# Patient Record
Sex: Female | Born: 1975 | Race: White | Hispanic: No | Marital: Married | Smoking: Former smoker
Health system: Southern US, Community
[De-identification: ages and names within clinical notes are randomized; demographics above are authoritative.]

## PROBLEM LIST (undated history)

## (undated) DIAGNOSIS — U099 Post covid-19 condition, unspecified: Secondary | ICD-10-CM

## (undated) DIAGNOSIS — R569 Unspecified convulsions: Secondary | ICD-10-CM

## (undated) DIAGNOSIS — R55 Syncope and collapse: Secondary | ICD-10-CM

## (undated) DIAGNOSIS — I499 Cardiac arrhythmia, unspecified: Secondary | ICD-10-CM

## (undated) DIAGNOSIS — F419 Anxiety disorder, unspecified: Secondary | ICD-10-CM

## (undated) DIAGNOSIS — Z8679 Personal history of other diseases of the circulatory system: Secondary | ICD-10-CM

## (undated) DIAGNOSIS — G43909 Migraine, unspecified, not intractable, without status migrainosus: Secondary | ICD-10-CM

## (undated) DIAGNOSIS — K219 Gastro-esophageal reflux disease without esophagitis: Secondary | ICD-10-CM

## (undated) DIAGNOSIS — I509 Heart failure, unspecified: Secondary | ICD-10-CM

## (undated) DIAGNOSIS — J45909 Unspecified asthma, uncomplicated: Secondary | ICD-10-CM

## (undated) DIAGNOSIS — G4730 Sleep apnea, unspecified: Secondary | ICD-10-CM

## (undated) DIAGNOSIS — M1990 Unspecified osteoarthritis, unspecified site: Secondary | ICD-10-CM

## (undated) DIAGNOSIS — F329 Major depressive disorder, single episode, unspecified: Secondary | ICD-10-CM

## (undated) DIAGNOSIS — F32A Depression, unspecified: Secondary | ICD-10-CM

## (undated) HISTORY — DX: Migraine, unspecified, not intractable, without status migrainosus: G43.909

## (undated) HISTORY — DX: Unspecified osteoarthritis, unspecified site: M19.90

## (undated) HISTORY — DX: Gastro-esophageal reflux disease without esophagitis: K21.9

## (undated) HISTORY — DX: Personal history of other diseases of the circulatory system: Z86.79

## (undated) HISTORY — DX: Post covid-19 condition, unspecified: U09.9

## (undated) HISTORY — DX: Heart failure, unspecified: I50.9

## (undated) HISTORY — DX: Sleep apnea, unspecified: G47.30

## (undated) HISTORY — DX: Unspecified asthma, uncomplicated: J45.909

## (undated) HISTORY — PX: OTHER SURGICAL HISTORY: SHX169

## (undated) HISTORY — DX: Unspecified convulsions: R56.9

## (undated) HISTORY — DX: Major depressive disorder, single episode, unspecified: F32.9

## (undated) HISTORY — DX: Depression, unspecified: F32.A

## (undated) HISTORY — DX: Syncope and collapse: R55

## (undated) HISTORY — DX: Anxiety disorder, unspecified: F41.9

---

## 2002-09-25 HISTORY — PX: TUBAL LIGATION: SHX77

## 2006-01-04 ENCOUNTER — Ambulatory Visit: Payer: Self-pay | Admitting: *Deleted

## 2007-04-23 ENCOUNTER — Ambulatory Visit: Payer: Self-pay | Admitting: Internal Medicine

## 2007-05-06 ENCOUNTER — Emergency Department: Payer: Self-pay

## 2007-05-06 ENCOUNTER — Other Ambulatory Visit: Payer: Self-pay

## 2007-05-10 ENCOUNTER — Ambulatory Visit: Payer: Self-pay | Admitting: Internal Medicine

## 2008-03-23 ENCOUNTER — Ambulatory Visit: Payer: Self-pay | Admitting: Internal Medicine

## 2008-03-26 ENCOUNTER — Ambulatory Visit: Payer: Self-pay | Admitting: Internal Medicine

## 2008-06-01 ENCOUNTER — Emergency Department: Payer: Self-pay | Admitting: Internal Medicine

## 2008-06-01 ENCOUNTER — Other Ambulatory Visit: Payer: Self-pay

## 2008-06-05 ENCOUNTER — Ambulatory Visit: Payer: Self-pay | Admitting: Otolaryngology

## 2008-10-20 ENCOUNTER — Ambulatory Visit: Payer: Self-pay | Admitting: Gastroenterology

## 2009-04-18 ENCOUNTER — Emergency Department: Payer: Self-pay | Admitting: Emergency Medicine

## 2009-04-26 ENCOUNTER — Encounter: Payer: Self-pay | Admitting: Family Medicine

## 2011-12-21 LAB — HM PAP SMEAR: HM Pap smear: NEGATIVE

## 2012-02-03 ENCOUNTER — Emergency Department: Payer: Self-pay | Admitting: Internal Medicine

## 2012-02-03 LAB — URINALYSIS, COMPLETE
Bilirubin,UR: NEGATIVE
Glucose,UR: NEGATIVE mg/dL (ref 0–75)
Ketone: NEGATIVE
Ph: 6 (ref 4.5–8.0)
RBC,UR: 53 /HPF (ref 0–5)
Specific Gravity: 1.005 (ref 1.003–1.030)
Squamous Epithelial: 2
Transitional Epi: <1

## 2012-02-05 LAB — URINE CULTURE

## 2012-03-18 LAB — HM COLONOSCOPY

## 2012-04-11 ENCOUNTER — Emergency Department: Payer: Self-pay | Admitting: Unknown Physician Specialty

## 2012-07-28 ENCOUNTER — Emergency Department: Payer: Self-pay | Admitting: Emergency Medicine

## 2013-01-01 DIAGNOSIS — N971 Female infertility of tubal origin: Secondary | ICD-10-CM | POA: Insufficient documentation

## 2013-03-18 ENCOUNTER — Ambulatory Visit: Payer: Self-pay | Admitting: Internal Medicine

## 2013-04-23 ENCOUNTER — Emergency Department: Payer: Self-pay | Admitting: Emergency Medicine

## 2013-04-23 LAB — URINALYSIS, COMPLETE
Bilirubin,UR: NEGATIVE
Ketone: NEGATIVE
Nitrite: NEGATIVE
Protein: NEGATIVE
Specific Gravity: 1.002 (ref 1.003–1.030)
Squamous Epithelial: 1
WBC UR: NONE SEEN /HPF (ref 0–5)

## 2013-04-23 LAB — CBC
HGB: 14.3 g/dL (ref 12.0–16.0)
MCH: 28.9 pg (ref 26.0–34.0)
MCHC: 34.3 g/dL (ref 32.0–36.0)
Platelet: 221 10*3/uL (ref 150–440)
RBC: 4.93 10*6/uL (ref 3.80–5.20)
RDW: 13.7 % (ref 11.5–14.5)

## 2013-04-23 LAB — COMPREHENSIVE METABOLIC PANEL
Albumin: 3.8 g/dL (ref 3.4–5.0)
Anion Gap: 6 — ABNORMAL LOW (ref 7–16)
BUN: 5 mg/dL — ABNORMAL LOW (ref 7–18)
Bilirubin,Total: 0.4 mg/dL (ref 0.2–1.0)
Calcium, Total: 8.5 mg/dL (ref 8.5–10.1)
EGFR (African American): >60
EGFR (Non-African Amer.): >60
Osmolality: 276 (ref 275–301)
Potassium: 3.5 mmol/L (ref 3.5–5.1)
SGPT (ALT): 17 U/L (ref 12–78)
Total Protein: 7 g/dL (ref 6.4–8.2)

## 2013-04-23 LAB — HCG, QUANTITATIVE, PREGNANCY: Beta Hcg, Quant.: 74 m[IU]/mL — ABNORMAL HIGH

## 2013-04-25 ENCOUNTER — Other Ambulatory Visit: Payer: Self-pay | Admitting: Emergency Medicine

## 2013-04-25 LAB — HCG, QUANTITATIVE, PREGNANCY: Beta Hcg, Quant.: 49 m[IU]/mL — ABNORMAL HIGH

## 2013-05-15 ENCOUNTER — Ambulatory Visit: Payer: Self-pay | Admitting: Internal Medicine

## 2013-07-16 ENCOUNTER — Encounter: Payer: Self-pay | Admitting: Internal Medicine

## 2013-07-16 ENCOUNTER — Ambulatory Visit (INDEPENDENT_AMBULATORY_CARE_PROVIDER_SITE_OTHER): Payer: 59 | Admitting: Internal Medicine

## 2013-07-16 VITALS — BP 120/80 | HR 102 | Temp 98.4°F | Ht 66.0 in | Wt 159.0 lb

## 2013-07-16 DIAGNOSIS — J45909 Unspecified asthma, uncomplicated: Secondary | ICD-10-CM

## 2013-07-16 DIAGNOSIS — Z733 Stress, not elsewhere classified: Secondary | ICD-10-CM

## 2013-07-16 DIAGNOSIS — G43909 Migraine, unspecified, not intractable, without status migrainosus: Secondary | ICD-10-CM

## 2013-07-16 DIAGNOSIS — G40909 Epilepsy, unspecified, not intractable, without status epilepticus: Secondary | ICD-10-CM

## 2013-07-16 DIAGNOSIS — F439 Reaction to severe stress, unspecified: Secondary | ICD-10-CM

## 2013-07-16 DIAGNOSIS — Z1239 Encounter for other screening for malignant neoplasm of breast: Secondary | ICD-10-CM

## 2013-07-16 DIAGNOSIS — K5900 Constipation, unspecified: Secondary | ICD-10-CM

## 2013-07-16 MED ORDER — FLUTICASONE PROPIONATE HFA 110 MCG/ACT IN AERO: 2.0000 | INHALATION_SPRAY | Freq: Two times a day (BID) | RESPIRATORY_TRACT | Status: DC

## 2013-07-16 MED ORDER — ALBUTEROL SULFATE HFA 108 (90 BASE) MCG/ACT IN AERS: 2.0000 | INHALATION_SPRAY | Freq: Four times a day (QID) | RESPIRATORY_TRACT | Status: DC | PRN

## 2013-07-20 ENCOUNTER — Encounter: Payer: Self-pay | Admitting: Internal Medicine

## 2013-07-20 DIAGNOSIS — G40909 Epilepsy, unspecified, not intractable, without status epilepticus: Secondary | ICD-10-CM | POA: Insufficient documentation

## 2013-07-20 DIAGNOSIS — R198 Other specified symptoms and signs involving the digestive system and abdomen: Secondary | ICD-10-CM | POA: Insufficient documentation

## 2013-07-20 DIAGNOSIS — F439 Reaction to severe stress, unspecified: Secondary | ICD-10-CM | POA: Insufficient documentation

## 2013-07-20 DIAGNOSIS — G43909 Migraine, unspecified, not intractable, without status migrainosus: Secondary | ICD-10-CM | POA: Insufficient documentation

## 2013-07-20 DIAGNOSIS — J45909 Unspecified asthma, uncomplicated: Secondary | ICD-10-CM | POA: Insufficient documentation

## 2013-07-20 NOTE — Progress Notes (Signed)
Subjective:    Patient ID: Heather [name], female    DOB: February 28, 1976, 37 y.o.   MRN: [id number]  HPI 37 year old female with past history of migraine headaches, seizure disorder, depression and asthma who comes in today to follow up on these issues as well as to transfer her care here to [facility].  She states that she is still having issues with migraine headaches.  Takes excedrin migraine and gets in a dark room.  Has seen neurology previously for her seizures.  The seizures have stopped.  On no medication now.  Headaches are persistent.  Still sees Dr Evelene Croon.  On valium now prn.  Does not take this daily.  On no other medication.  States she is doing better now that she is separated.  Headaches occur either pre or post her menstrual cycle.  She also states she is using a breathing treatment daily.  Not using her flovent regularly.  She is out of the medication.  No nausea or vomiting.  Does report constipation.  Takes fiber daily.  Has dx of IBS.  Last bowel movement two days ago.     Past Medical History  Diagnosis Date  . Depression     bipolar  . Migraines   . Asthma   . Seizures   . H/O cardiac arrhythmia   . Fainting spell     H/O    Outpatient Encounter Prescriptions as of 07/16/2013  Medication Sig Dispense Refill  . albuterol (PROVENTIL HFA;VENTOLIN HFA) 108 (90 BASE) MCG/ACT inhaler Inhale 2 puffs into the lungs every 6 (six) hours as needed for wheezing.  1 Inhaler  3  . diazepam (VALIUM) 2 MG tablet 1-2 tablets daily as needed      . fluticasone (FLOVENT HFA) 110 MCG/ACT inhaler Inhale 2 puffs into the lungs 2 (two) times daily.  1 Inhaler  3  . [DISCONTINUED] albuterol (PROVENTIL HFA;VENTOLIN HFA) 108 (90 BASE) MCG/ACT inhaler Inhale 2 puffs into the lungs every 6 (six) hours as needed for wheezing.      . [DISCONTINUED] fluticasone (FLOVENT HFA) 110 MCG/ACT inhaler Inhale 2 puffs into the lungs 2 (two) times daily.      . [DISCONTINUED] levalbuterol (XOPENEX HFA) 45 MCG/ACT  inhaler Inhale 2 puffs into the lungs every 6 (six) hours as needed for wheezing.      . [DISCONTINUED] omeprazole (PRILOSEC) 20 MG capsule Take 20 mg by mouth daily.      . [DISCONTINUED] Belladonna Alk-Phenobarbital (DONNATAL PO) 1 tsp as needed for severe pain      . [DISCONTINUED] levETIRAcetam (KEPPRA) 250 MG tablet Take 250 mg by mouth 2 (two) times daily.       No facility-administered encounter medications on file as of 07/16/2013.    Review of Systems Patient denies any lightheadedness or dizziness.  Persistent intermittent headaches as outlined.   No chest pain, tightness or palpitations.  Breathing as outlined.   No nausea or vomiting.  No acid reflux.  No abdominal pain or cramping.  No bowel change, such as diarrhea, BRBPR or melana.  Does report constipation as outlined.  Stress better since her separation.   No urine change.        Objective:   Physical Exam Filed Vitals:   07/16/13 0916  BP: 120/80  Pulse: 102  Temp: 98.4 F (56.48 C)   37 year old female in no acute distress.   HEENT:  Nares- clear.  Oropharynx - without lesions. NECK:  Supple.  Nontender.  No  audible bruit.  HEART:  Appears to be regular. LUNGS:  No crackles or wheezing audible.  Respirations even and unlabored.  RADIAL PULSE:  Equal bilaterally.     ABDOMEN:  Soft, nontender.  Bowel sounds present and normal.  No audible abdominal bruit.    EXTREMITIES:  No increased edema present.  DP pulses palpable and equal bilaterally.          Assessment & Plan:  HEALTH MAINTENANCE.  Schedule her for a physical when due.  Obtain records to review.  Has never had a mammogram.  Schedule.    I spent 30 minutes with the patient and more than 50% of the time was spent in consultation regarding the above.

## 2013-07-20 NOTE — Assessment & Plan Note (Signed)
Persistent intermittent problem for her.  States she is afraid of some of the medications for headaches.  After discussion, she preferred to be referred to neurology for evaluation and further treatment.

## 2013-07-20 NOTE — Assessment & Plan Note (Signed)
States is better since her and her husband separated.  Still seeing Dr Evelene Croon.  Has valium.

## 2013-07-20 NOTE — Assessment & Plan Note (Signed)
Last bowel movement two days ago.  We discussed using and enema or suppository.  Start miralax daily after next bowel movement.  Follow. Stay hydrated.  Fiber.

## 2013-07-20 NOTE — Assessment & Plan Note (Signed)
No seizures now.  On no medication.  Follow. Refer to neurology as outlined.

## 2013-07-20 NOTE — Assessment & Plan Note (Signed)
Breathing as outlined.  Needs to restart flovent.  Use albuterol as needed.  Follow.

## 2013-07-25 ENCOUNTER — Telehealth: Payer: Self-pay | Admitting: Internal Medicine

## 2013-07-25 ENCOUNTER — Encounter: Payer: Self-pay | Admitting: Internal Medicine

## 2013-07-25 ENCOUNTER — Ambulatory Visit (INDEPENDENT_AMBULATORY_CARE_PROVIDER_SITE_OTHER): Payer: 59 | Admitting: Internal Medicine

## 2013-07-25 VITALS — BP 128/82 | HR 89 | Temp 98.7°F | Resp 12 | Wt 160.8 lb

## 2013-07-25 DIAGNOSIS — G43909 Migraine, unspecified, not intractable, without status migrainosus: Secondary | ICD-10-CM

## 2013-07-25 MED ORDER — HYDROCODONE-ACETAMINOPHEN 5-325 MG PO TABS: 1.0000 | ORAL_TABLET | Freq: Four times a day (QID) | ORAL | Status: DC | PRN

## 2013-07-25 MED ORDER — AMITRIPTYLINE HCL 25 MG PO TABS: 25.0000 mg | ORAL_TABLET | Freq: Every day | ORAL | Status: DC

## 2013-07-25 MED ORDER — PROMETHAZINE HCL 12.5 MG PO TABS: 12.5000 mg | ORAL_TABLET | Freq: Three times a day (TID) | ORAL | Status: DC | PRN

## 2013-07-25 NOTE — Patient Instructions (Addendum)
I am starting you on amitriptyline at bedtime for migraine prevention .,  Start with 25 mg ,  You can increase to 50 mg after 5 days.  You can use the vicodin and phenergan for your current headache relief.   Please follow up with Dr Lorin Picket in a few weeks

## 2013-07-25 NOTE — Telephone Encounter (Signed)
Patient Information:  Caller Name: Asher Muir  Phone: [phone]  Patient: Heather [name], Heather [name]  Gender: Female  DOB: 1976/04/17  Age: 37 Years  PCP: Dale McIntosh  Pregnant: No  Office Follow Up:  Does the office need to follow up with this patient?: No  Instructions For The Office: N/A  RN Note:  Appt scheduled today at 2PM with Duncan Dull  Symptoms  Reason For Call & Symptoms: Today, 07/25/2013, pt calling with c/o of headache  since 07/23/2013, and requesting  rx. She has been taking Excedrin Migraine and Aleve. She had some relief on 07/24/2013, but OTC not helping now.  Rating pain  6/10.  Reviewed Health History In EMR: Yes  Reviewed Medications In EMR: Yes  Reviewed Allergies In EMR: Yes  Reviewed Surgeries / Procedures: Yes  Date of Onset of Symptoms: 07/23/2013  Treatments Tried: Aleve, Excedrin  Treatments Tried Worked: No OB / GYN:  LMP: 07/21/2013  Guideline(s) Used:  Headache  Disposition Per Guideline:   See Today or Tomorrow in Office  Reason For Disposition Reached:   Unexplained headache that is present > 24 hours  Advice Given:  Call Back If:  You become worse.  Patient Will Follow Care Advice:  YES  Appointment Scheduled:  07/25/2013 14:00:00 Appointment Scheduled Provider:  N/A

## 2013-07-25 NOTE — Progress Notes (Signed)
Patient ID: Heather [name], female   DOB: [birth year], 37 y.o.   MRN: [id number] Patient Active Problem List   Diagnosis Date Noted  . Migraine 07/20/2013  . Seizure disorder 07/20/2013  . Stress 07/20/2013  . Asthma 07/20/2013  . Constipation 07/20/2013    Subjective:  CC:   Chief Complaint  Patient presents with  . Headache    X 3 days    HPI:   Heather [name] a 37 y.o. female who presents  Past Medical History  Diagnosis Date  . Depression     bipolar  . Migraines   . Asthma   . Seizures   . H/O cardiac arrhythmia   . Fainting spell     H/O    Past Surgical History  Procedure Laterality Date  . Tubal ligation Bilateral 09/2002       The following portions of the patient's history were reviewed and updated as appropriate: Allergies, current medications, and problem list.    Review of Systems:   Patient denies, fevers,  unintentional weight loss, skin rash, eye pain, sinus congestion and sinus pain, sore throat, dysphagia,  hemoptysis , cough, dyspnea, wheezing, chest pain, palpitations, orthopnea, edema, abdominal pain, nausea, melena, diarrhea, constipation, flank pain, dysuria, hematuria, urinary  Frequency, nocturia, numbness, tingling, seizures,  Focal weakness, Loss of consciousness,  Tremor, insomnia, depression, anxiety, and suicidal ideation.     History   Social History  . Marital Status: Single    Spouse Name: N/A    Number of Children: N/A  . Years of Education: N/A   Occupational History  . Not on file.   Social History Main Topics  . Smoking status: Former Smoker -- 0.50 packs/day    Types: Cigarettes  . Smokeless tobacco: Never Used     Comment: Quit mid October 2014  . Alcohol Use: No  . Drug Use: No  . Sexual Activity: Not on file   Other Topics Concern  . Not on file   Social History Narrative  . No narrative on file    Objective:  Filed Vitals:   07/25/13 1411  BP: 128/82  Pulse: 89  Temp: 98.7 F (37.1 C)   Resp: 12     General appearance: alert, cooperative and appears stated age. Looks to be in pain  Ears: normal TM's and external ear canals both ears Neck: no adenopathy, no carotid bruit, supple, symmetrical, trachea midline and thyroid not enlarged Lungs: clear to auscultation bilaterally Heart: regular rate and rhythm, S1, S2 normal, no murmur, click, rub or gallop Abdomen: soft, non-tender; bowel sounds normal; no masses,  no organomegaly Pulses: 2+ and symmetric Neuro: CNs 2-12 intact. DTRs 2+/4 in biceps, brachioradialis, patellars and achilles. Muscle strength 5/5 in upper and lower exremities. Fine resting tremor bilaterally both hands cerebellar function normal. Romberg negative.  No pronator drift.   Gait normal.   Assessment and Plan:  Migraine Current and recurrent . Prescribed  amitryptiline in a titrating dose  for prevention,  And vicodin and promethazine for acute symptoms     Updated Medication List Outpatient Encounter Prescriptions as of 07/25/2013  Medication Sig  . albuterol (PROVENTIL HFA;VENTOLIN HFA) 108 (90 BASE) MCG/ACT inhaler Inhale 2 puffs into the lungs every 6 (six) hours as needed for wheezing.  Marland Kitchen amitriptyline (ELAVIL) 25 MG tablet Take 1 tablet (25 mg total) by mouth at bedtime. Increase to 2 tablets after 5 days if needed  . diazepam (VALIUM) 2 MG tablet 1-2 tablets daily as needed  .  fluticasone (FLOVENT HFA) 110 MCG/ACT inhaler Inhale 2 puffs into the lungs 2 (two) times daily.  Marland Kitchen HYDROcodone-acetaminophen (NORCO/VICODIN) 5-325 MG per tablet Take 1 tablet by mouth every 6 (six) hours as needed for pain.  . promethazine (PHENERGAN) 12.5 MG tablet Take 1 tablet (12.5 mg total) by mouth every 8 (eight) hours as needed for nausea.     No orders of the defined types were placed in this encounter.    No Follow-up on file.

## 2013-07-25 NOTE — Telephone Encounter (Signed)
FYI-seeing Dr. Darrick Huntsman at 2:00

## 2013-07-25 NOTE — Assessment & Plan Note (Addendum)
Current and recurrent . Prescribed  amitryptiline in a titrating dose  for prevention,  And vicodin and promethazine for acute symptoms

## 2013-08-01 ENCOUNTER — Encounter: Payer: Self-pay | Admitting: Emergency Medicine

## 2013-08-20 ENCOUNTER — Ambulatory Visit: Payer: 59 | Admitting: Adult Health

## 2013-08-20 ENCOUNTER — Ambulatory Visit (INDEPENDENT_AMBULATORY_CARE_PROVIDER_SITE_OTHER): Payer: 59 | Admitting: Adult Health

## 2013-08-20 VITALS — BP 126/82 | HR 102 | Temp 98.2°F | Resp 14 | Wt 162.0 lb

## 2013-08-20 DIAGNOSIS — M542 Cervicalgia: Secondary | ICD-10-CM

## 2013-08-20 MED ORDER — PREDNISONE 10 MG PO TABS: ORAL_TABLET | ORAL | Status: DC

## 2013-08-20 MED ORDER — CYCLOBENZAPRINE HCL 5 MG PO TABS: 5.0000 mg | ORAL_TABLET | Freq: Three times a day (TID) | ORAL | Status: DC | PRN
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
## 2013-08-20 NOTE — Assessment & Plan Note (Signed)
Muscle strain - significant. Ongoing > 1.5 weeks. Prednisone taper, flexeril. If no improvement in 1-2 weeks we'll send for films and physical therapy as needed.

## 2013-08-20 NOTE — Progress Notes (Signed)
Pre visit review using our clinic review tool, if applicable. No additional management support is needed unless otherwise documented below in the visit note. 

## 2013-08-20 NOTE — Patient Instructions (Signed)
  Begin a prednisone taper as follows:   Day one-6 tablets  Day 2 - 5  tablets  Day 3 - 4 tablets  Day 4 - 3 tablets  Day 5 - 2 tablets  Day 6 - 1 tablets   While taking the prednisone, do not take ibuprofen, aleve, motrin. You may take tylenol or your hydrocodone as needed. Once you finish the prednisone taper then you can resume the ibuprofen or aleve.  Take flexeril 3 times a day as needed for muscle spasms. This medication may make you sleepy.  Apply ice alternating with heat to the area. Try to rest. Use a good neck support pillow.  If no improvement within 1-2 weeks please call and we will send you for xray of your neck.

## 2013-10-28 ENCOUNTER — Encounter: Payer: 59 | Admitting: Internal Medicine

## 2013-11-27 ENCOUNTER — Encounter: Payer: Self-pay | Admitting: Internal Medicine

## 2014-06-19 ENCOUNTER — Emergency Department: Payer: Self-pay | Admitting: Emergency Medicine

## 2014-06-19 LAB — CBC
HCT: 40.7 % (ref 35.0–47.0)
HGB: 13.2 g/dL (ref 12.0–16.0)
MCH: 28.1 pg (ref 26.0–34.0)
MCHC: 32.5 g/dL (ref 32.0–36.0)
MCV: 87 fL (ref 80–100)
PLATELETS: 185 10*3/uL (ref 150–440)
RBC: 4.7 10*6/uL (ref 3.80–5.20)
RDW: 14.2 % (ref 11.5–14.5)
WBC: 10.6 10*3/uL (ref 3.6–11.0)

## 2014-06-19 LAB — COMPREHENSIVE METABOLIC PANEL
ALBUMIN: 3.5 g/dL (ref 3.4–5.0)
ALK PHOS: 169 U/L — AB
ANION GAP: 6 — AB (ref 7–16)
BUN: 5 mg/dL — ABNORMAL LOW (ref 7–18)
Bilirubin,Total: 0.4 mg/dL (ref 0.2–1.0)
CALCIUM: 8.2 mg/dL — AB (ref 8.5–10.1)
CHLORIDE: 107 mmol/L (ref 98–107)
Co2: 25 mmol/L (ref 21–32)
Creatinine: 0.89 mg/dL (ref 0.60–1.30)
EGFR (Non-African Amer.): >60
Glucose: 105 mg/dL — ABNORMAL HIGH (ref 65–99)
OSMOLALITY: 273 (ref 275–301)
Potassium: 3.5 mmol/L (ref 3.5–5.1)
SGOT(AST): 32 U/L (ref 15–37)
SGPT (ALT): 49 U/L
SODIUM: 138 mmol/L (ref 136–145)
Total Protein: 7.1 g/dL (ref 6.4–8.2)

## 2014-06-19 LAB — TROPONIN I: Troponin-I: <0.02 ng/mL

## 2014-06-24 ENCOUNTER — Ambulatory Visit: Payer: 59 | Admitting: Internal Medicine

## 2014-06-24 ENCOUNTER — Telehealth: Payer: Self-pay | Admitting: Internal Medicine

## 2014-06-24 NOTE — Telephone Encounter (Signed)
Pt late for appt. Pt stated that she wrote down 8:45 on calendar. Please advise to cancel [email or web address]

## 2014-06-25 ENCOUNTER — Ambulatory Visit: Payer: 59 | Admitting: Internal Medicine

## 2014-06-25 DIAGNOSIS — Z0289 Encounter for other administrative examinations: Secondary | ICD-10-CM

## 2014-07-27 ENCOUNTER — Emergency Department: Payer: Self-pay | Admitting: Emergency Medicine

## 2014-08-15 ENCOUNTER — Other Ambulatory Visit: Payer: Self-pay | Admitting: Internal Medicine

## 2015-02-02 NOTE — H&P (Signed)
L&D Evaluation:  History Expanded:  HPI 73 yuo [id number] with two SAB no d and c neededwho had her tubes re[facility]astam[facility] on May 9. 2014. she had her next menses on May 25, june 2, july 23 which she did not have an didi a UPT and got light faint pos four days ago. Today she presents to the ER with severe pain in her right LQ and some spotting aout 3-4 days ago. SHe has had pain on the right since her surgery and this is just an exacerbation of that pain.   Gravida 6   Term 4   PreTerm 0   Abortion 2   Living 4   Blood Type (Maternal) pending   Group B Strep Results Maternal (Result >5wks must be treated as unknown) unknown/result > 5 weeks ago   Maternal HIV Unknown   Maternal Syphilis Ab Unknown   Maternal Varicella Unknown   Rubella Results (Maternal) unknown   Maternal T-Dap Unknown   [facility] 21-Feb-2014   Presents with abdominal pain, vaginal bleeding   Patient's Medical History No Chronic Illness   Patient's Surgical History none   Medications Pre Natal Vitamins   Allergies NKDA   Social History none   Family History Non-Contributory   Current Prenatal Course Notable For possible ectopic   ROS:  General normal   HEENT normal   CNS normal   GI normal   GU rlq pain sharp stabbing   Resp normal   CV normal   Renal normal   MS normal   Exam:  Vital Signs stable   Urine Protein not completed   General no apparent distress   Mental Status clear   Chest clear   Heart normal sinus rhythm   Abdomen slight tender to palpation but not rebound   Estimated Fetal Weight Small for gestational age   Back no CVAT   Reflexes 1+   Pelvic no external lesions   Mebranes Intact   FHT too early to see   Other US shows complex mass on right side and fluid in the uterus with no fetal pole beta level is 75   Impression:  Impression ectopic vs easrly iup   Plan:  Plan serial quant beat ion friday am and [place] my office 05/05/13 to see if IUP    Comments call if any worsening ofthe pain or bleeding.   Follow Up Appointment need to schedule   Electronic Signatures: Erik Obey (MD)  (Signed 30-Jul-14 16:27)  Authored: L&D Evaluation   Last Updated: 30-Jul-14 16:27 by Erik Obey (MD)

## 2015-06-25 ENCOUNTER — Encounter: Payer: Self-pay | Admitting: *Deleted

## 2015-06-25 ENCOUNTER — Emergency Department
Admission: EM | Admit: 2015-06-25 | Discharge: 2015-06-26 | Disposition: A | Discharge status: Home / Self Care | Payer: 59 | Attending: Emergency Medicine | Admitting: Emergency Medicine

## 2015-06-25 ENCOUNTER — Emergency Department: Payer: 59

## 2015-06-25 DIAGNOSIS — Z791 Long term (current) use of non-steroidal anti-inflammatories (NSAID): Secondary | ICD-10-CM | POA: Insufficient documentation

## 2015-06-25 DIAGNOSIS — J45909 Unspecified asthma, uncomplicated: Secondary | ICD-10-CM | POA: Diagnosis present

## 2015-06-25 DIAGNOSIS — Z79899 Other long term (current) drug therapy: Secondary | ICD-10-CM | POA: Insufficient documentation

## 2015-06-25 DIAGNOSIS — Z87891 Personal history of nicotine dependence: Secondary | ICD-10-CM | POA: Insufficient documentation

## 2015-06-25 DIAGNOSIS — J45901 Unspecified asthma with (acute) exacerbation: Secondary | ICD-10-CM | POA: Insufficient documentation

## 2015-06-25 DIAGNOSIS — J069 Acute upper respiratory infection, unspecified: Secondary | ICD-10-CM | POA: Insufficient documentation

## 2015-06-25 DIAGNOSIS — Z7951 Long term (current) use of inhaled steroids: Secondary | ICD-10-CM | POA: Insufficient documentation

## 2015-06-25 LAB — BASIC METABOLIC PANEL
ANION GAP: 5 (ref 5–15)
BUN: 11 mg/dL (ref 6–20)
CALCIUM: 9.1 mg/dL (ref 8.9–10.3)
CO2: 23 mmol/L (ref 22–32)
Chloride: 111 mmol/L (ref 101–111)
Creatinine, Ser: 0.76 mg/dL (ref 0.44–1.00)
Glucose, Bld: 112 mg/dL — ABNORMAL HIGH (ref 65–99)
POTASSIUM: 3.5 mmol/L (ref 3.5–5.1)
SODIUM: 139 mmol/L (ref 135–145)

## 2015-06-25 LAB — CBC
HCT: 42.4 % (ref 35.0–47.0)
Hemoglobin: 14.4 g/dL (ref 12.0–16.0)
MCH: 28 pg (ref 26.0–34.0)
MCHC: 34 g/dL (ref 32.0–36.0)
MCV: 82.4 fL (ref 80.0–100.0)
PLATELETS: 277 10*3/uL (ref 150–440)
RBC: 5.14 MIL/uL (ref 3.80–5.20)
RDW: 14.8 % — ABNORMAL HIGH (ref 11.5–14.5)
WBC: 16.8 10*3/uL — AB (ref 3.6–11.0)

## 2015-06-25 MED ORDER — PREDNISONE 20 MG PO TABS: 40.0000 mg | ORAL_TABLET | Freq: Every day | ORAL | Status: DC

## 2015-06-25 MED ORDER — METHYLPREDNISOLONE SODIUM SUCC 125 MG IJ SOLR
125.0000 mg | Freq: Once | INTRAMUSCULAR | Status: AC
  Administered 2015-06-25: 125 mg via INTRAVENOUS
  Filled 2015-06-25: qty 2

## 2015-06-25 MED ORDER — ALBUTEROL SULFATE (2.5 MG/3ML) 0.083% IN NEBU
INHALATION_SOLUTION | RESPIRATORY_TRACT | Status: AC
  Administered 2015-06-25: 5 mg via RESPIRATORY_TRACT
  Filled 2015-06-25: qty 3

## 2015-06-25 MED ORDER — IPRATROPIUM-ALBUTEROL 0.5-2.5 (3) MG/3ML IN SOLN
3.0000 mL | Freq: Once | RESPIRATORY_TRACT | Status: AC
  Administered 2015-06-25: 3 mL via RESPIRATORY_TRACT
  Filled 2015-06-25: qty 3

## 2015-06-25 MED ORDER — ALBUTEROL SULFATE (2.5 MG/3ML) 0.083% IN NEBU
5.0000 mg | INHALATION_SOLUTION | Freq: Once | RESPIRATORY_TRACT | Status: AC
  Administered 2015-06-25: 5 mg via RESPIRATORY_TRACT
  Filled 2015-06-25: qty 3

## 2015-06-25 MED ORDER — ALBUTEROL SULFATE HFA 108 (90 BASE) MCG/ACT IN AERS: 2.0000 | INHALATION_SPRAY | Freq: Four times a day (QID) | RESPIRATORY_TRACT | Status: DC | PRN

## 2015-06-25 MED ORDER — AZITHROMYCIN 250 MG PO TABS: ORAL_TABLET | ORAL | Status: AC

## 2015-06-25 MED ORDER — ALBUTEROL SULFATE (2.5 MG/3ML) 0.083% IN NEBU: 2.5000 mg | INHALATION_SOLUTION | Freq: Four times a day (QID) | RESPIRATORY_TRACT | Status: DC | PRN

## 2015-06-25 NOTE — ED Notes (Signed)
Pt presents w/ audible wheezing and coarse breath sounds, dyspnea, and labored breathing, unable to complete sentences. Pt has hx of asthma and has been using nebs and MDI @ home w/o relief. Pt has dry, deep cough, occasionally productive w/ tenacious yellow/white mucous.

## 2015-06-25 NOTE — ED Provider Notes (Signed)
[facility] Emergency Department Provider Note  Time seen: 10:51 PM  I have reviewed the triage vital signs and the nursing notes.   HISTORY  Chief Complaint Asthma    HPI Heather [name] is a 39 y.o. female with a past medical history of asthma presents the emergency department with chest tightness, cough and trouble breathing for the past 5 days. According to the patient for the past 5 days she has had a dry cough, trouble breathing and a lot of chest tightness. Patient states increased wheeze at home, no better with nebulizers. Denies fever. Denies chest pain but states chest tightness. Mild to moderate chest tightness, no modifying factors identified.    Past Medical History  Diagnosis Date  . Depression     bipolar  . Migraines   . Asthma   . Seizures   . H/O cardiac arrhythmia   . Fainting spell     H/O    Patient Active Problem List   Diagnosis Date Noted  . Neck pain on left side 08/20/2013  . Migraine 07/20/2013  . Seizure disorder 07/20/2013  . Stress 07/20/2013  . Asthma 07/20/2013  . Constipation 07/20/2013    Past Surgical History  Procedure Laterality Date  . Tubal ligation Bilateral 09/2002    Current Outpatient Rx  Name  Route  Sig  Dispense  Refill  . amitriptyline (ELAVIL) 25 MG tablet   Oral   Take 1 tablet (25 mg total) by mouth at bedtime. Increase to 2 tablets after 5 days if needed   60 tablet   3   . cyclobenzaprine (FLEXERIL) 5 MG tablet   Oral   Take 1 tablet (5 mg total) by mouth 3 (three) times daily as needed for muscle spasms.   45 tablet   1   . diazepam (VALIUM) 2 MG tablet      1-2 tablets daily as needed         . fluticasone (FLOVENT HFA) 110 MCG/ACT inhaler   Inhalation   Inhale 2 puffs into the lungs 2 (two) times daily.   1 Inhaler   3   . HYDROcodone-acetaminophen (NORCO/VICODIN) 5-325 MG per tablet   Oral   Take 1 tablet by mouth every 6 (six) hours as needed for pain.   30  tablet   0   . ibuprofen (ADVIL,MOTRIN) 200 MG tablet   Oral   Take 200 mg by mouth every 6 (six) hours as needed.         . naproxen sodium (ANAPROX) 220 MG tablet   Oral   Take 220 mg by mouth 2 (two) times daily with a meal.         . predniSONE (DELTASONE) 10 MG tablet      Take per instructions.   21 tablet   0   . PROAIR HFA 108 (90 BASE) MCG/ACT inhaler      INHALE 2 PUFFS INTO THE LUNGS EVERY 6 (SIX) HOURS AS NEEDED FOR WHEEZING.   8.5 each   0     PT NEEDS APPOINT FOR FURTHER REFILLS   . promethazine (PHENERGAN) 12.5 MG tablet   Oral   Take 1 tablet (12.5 mg total) by mouth every 8 (eight) hours as needed for nausea.   20 tablet   0     Allergies Augmentin; Erythromycin; Lithium; and Tramadol  Family History  Problem Relation Age of Onset  . Hypertension Mother   . Diabetes Mother   . Arthritis Mother   .  Heart disease Mother     Social History Social History  Substance Use Topics  . Smoking status: Former Smoker -- 0.50 packs/day    Types: Cigarettes  . Smokeless tobacco: Never Used     Comment: Quit mid October 2014  . Alcohol Use: No    Review of Systems Constitutional: Negative for fever. Cardiovascular: Positive for chest tightness Respiratory: Positive for shortness of breath positive for cough Gastrointestinal: Negative for abdominal pain Neurological: Negative for headache 10-point ROS otherwise negative.  ____________________________________________   PHYSICAL EXAM:  VITAL SIGNS: ED Triage Vitals  Enc Vitals Group     BP 06/25/15 1948 152/99 mmHg     Pulse Rate 06/25/15 1948 127     Resp 06/25/15 1948 22     Temp 06/25/15 1948 97.9 F (36.6 C)     Temp Source 06/25/15 1948 Oral     SpO2 06/25/15 1948 97 %     Weight 06/25/15 1948 155 lb (70.308 kg)     Height 06/25/15 1948 5' 5.5" (1.664 m)     Head Cir --      Peak Flow --      Pain Score 06/25/15 2237 0     Pain Loc --      Pain Edu? --      Excl. in [place]? --      Constitutional: Alert and oriented. Audible wheeze. No distress. Eyes: Normal exam ENT   Head: Normocephalic and atraumatic.   Mouth/Throat: Mucous membranes are moist. Cardiovascular: Normal rate, regular rhythm. No murmurs, rubs, or gallops. Respiratory: Normal respiratory effort, mild tachypnea, mild bilateral expiratory wheeze (status post DuoNeb's). Gastrointestinal: Soft and nontender. No distention.   Musculoskeletal: Nontender with normal range of motion in all extremities.  Neurologic:  Normal speech and language. No gross focal neurologic deficits  Skin:  Skin is warm, dry and intact.  Psychiatric: Mood and affect are normal. Speech and behavior are normal.  ____________________________________________   RADIOLOGY  Chest x-ray appears clear on my read.  ____________________________________________   INITIAL IMPRESSION / ASSESSMENT AND PLAN / ED COURSE  Pertinent labs & imaging results that were available during my care of the patient were reviewed by me and considered in my medical decision making (see chart for details).  Patient with shortness of breath, cough, wheeze, history of asthma. Likely URI exacerbating underlying asthma. We'll check basic labs, chest x-ray. Treat with DuoNeb's, Solu-Medrol, and closely monitor in the emergency department.  Chest x-ray appears clear, labs consistent with leukocytosis otherwise within normal limits. Heart rate currently 90-95 bpm. Patient states she is feeling better after breathing treatments. Continues to have a 97-100% room air oxygen saturation. We'll discharge patient home with steroids, Z-Pak, and refills of her home albuterol. Discussed with patient including strict asthma return precautions, patient is agreeable to plan. We'll follow up with a primary care physician.  ____________________________________________   FINAL CLINICAL IMPRESSION(S) / ED DIAGNOSES  Asthma exacerbation Upper respiratory  infection   Harvest Dark, MD 06/25/15 2314

## 2015-06-25 NOTE — ED Notes (Signed)
MD Paduchowski at bedside  

## 2015-06-25 NOTE — ED Notes (Signed)
Pt will be d/c upon completion of breathing treatment. Pt made aware and verbalized understanding at this time.

## 2015-06-25 NOTE — Discharge Instructions (Signed)
Asthma, Acute Bronchospasm °Acute bronchospasm caused by asthma is also referred to as an asthma attack. Bronchospasm means your air passages become narrowed. The narrowing is caused by inflammation and tightening of the muscles in the air tubes (bronchi) in your lungs. This can make it hard to breathe or cause you to wheeze and cough. °CAUSES °Possible triggers are: °· Animal dander from the skin, hair, or feathers of animals. °· Dust mites contained in house dust. °· Cockroaches. °· Pollen from trees or grass. °· Mold. °· Cigarette or tobacco smoke. °· Air pollutants such as dust, household cleaners, hair sprays, aerosol sprays, paint fumes, strong chemicals, or strong odors. °· Cold air or weather changes. Cold air may trigger inflammation. Winds increase molds and pollens in the air. °· Strong emotions such as crying or laughing hard. °· Stress. °· Certain medicines such as aspirin or beta-blockers. °· Sulfites in foods and drinks, such as dried fruits and wine. °· Infections or inflammatory conditions, such as a flu, cold, or inflammation of the nasal membranes (rhinitis). °· Gastroesophageal reflux disease (GERD). GERD is a condition where stomach acid backs up into your esophagus. °· Exercise or strenuous activity. °SIGNS AND SYMPTOMS  °· Wheezing. °· Excessive coughing, particularly at night. °· Chest tightness. °· Shortness of breath. °DIAGNOSIS  °Your health care provider will ask you about your medical history and perform a physical exam. A chest X-ray or blood testing may be performed to look for other causes of your symptoms or other conditions that may have triggered your asthma attack.  °TREATMENT  °Treatment is aimed at reducing inflammation and opening up the airways in your lungs.  Most asthma attacks are treated with inhaled medicines. These include quick relief or rescue medicines (such as bronchodilators) and controller medicines (such as inhaled corticosteroids). These medicines are sometimes  given through an inhaler or a nebulizer. Systemic steroid medicine taken by mouth or given through an IV tube also can be used to reduce the inflammation when an attack is moderate or severe. Antibiotic medicines are only used if a bacterial infection is present.  °HOME CARE INSTRUCTIONS  °· Rest. °· Drink plenty of liquids. This helps the mucus to remain thin and be easily coughed up. Only use caffeine in moderation and do not use alcohol until you have recovered from your illness. °· Do not smoke. Avoid being exposed to secondhand smoke. °· You play a critical role in keeping yourself in good health. Avoid exposure to things that cause you to wheeze or to have breathing problems. °· Keep your medicines up-to-date and available. Carefully follow your health care provider's treatment plan. °· Take your medicine exactly as prescribed. °· When pollen or pollution is bad, keep windows closed and use an air conditioner or go to places with air conditioning. °· Asthma requires careful medical care. See your health care provider for a follow-up as advised. If you are more than [redacted] weeks pregnant and you were prescribed any new medicines, let your obstetrician know about the visit and how you are doing. Follow up with your health care provider as directed. °· After you have recovered from your asthma attack, make an appointment with your outpatient doctor to talk about ways to reduce the likelihood of future attacks. If you do not have a doctor who manages your asthma, make an appointment with a primary care doctor to discuss your asthma. °SEEK IMMEDIATE MEDICAL CARE IF:  °· You are getting worse. °· You have trouble breathing. If severe, call your local   emergency services (911 in the [place]  You develop chest pain or discomfort.  You are vomiting.  You are not able to keep fluids down.  You are coughing up yellow, green, brown, or bloody sputum.  You have a fever and your symptoms suddenly get worse.  You have  trouble swallowing. MAKE SURE YOU:   Understand these instructions.  Will watch your condition.  Will get help right away if you are not doing well or get worse. Document Released: 12/27/2006 Document Revised: 09/16/2013 Document Reviewed: 03/19/2013 [facility] Patient Information 2015 [place], [place]. This information is not intended to replace advice given to you by your health care provider. Make sure you discuss any questions you have with your health care provider.  Upper Respiratory Infection, Adult An upper respiratory infection (URI) is also known as the common cold. It is often caused by a type of germ (virus). Colds are easily spread (contagious). You can pass it to others by kissing, coughing, sneezing, or drinking out of the same glass. Usually, you get better in 1 or 2 weeks.  HOME CARE   Only take medicine as told by your doctor.  Use a warm mist humidifier or breathe in steam from a hot shower.  Drink enough water and fluids to keep your pee (urine) clear or pale yellow.  Get plenty of rest.  Return to work when your temperature is back to normal or as told by your doctor. You may use a face mask and wash your hands to stop your cold from spreading. GET HELP RIGHT AWAY IF:   After the first few days, you feel you are getting worse.  You have questions about your medicine.  You have chills, shortness of breath, or brown or red spit (mucus).  You have yellow or brown snot (nasal discharge) or pain in the face, especially when you bend forward.  You have a fever, puffy (swollen) neck, pain when you swallow, or white spots in the back of your throat.  You have a bad headache, ear pain, sinus pain, or chest pain.  You have a high-pitched whistling sound when you breathe in and out (wheezing).  You have a lasting cough or cough up blood.  You have sore muscles or a stiff neck. MAKE SURE YOU:   Understand these instructions.  Will watch your condition.  Will get  help right away if you are not doing well or get worse. Document Released: 02/28/2008 Document Revised: 12/04/2011 Document Reviewed: 12/17/2013 [facility] Patient Information 2015 [place], [place]. This information is not intended to replace advice given to you by your health care provider. Make sure you discuss any questions you have with your health care provider.

## 2015-07-05 ENCOUNTER — Ambulatory Visit (INDEPENDENT_AMBULATORY_CARE_PROVIDER_SITE_OTHER)
Admission: RE | Admit: 2015-07-05 | Discharge: 2015-07-05 | Disposition: A | Discharge status: Home / Self Care | Payer: 59 | Source: Ambulatory Visit | Attending: Family Medicine | Admitting: Family Medicine

## 2015-07-05 ENCOUNTER — Encounter: Payer: Self-pay | Admitting: Family Medicine

## 2015-07-05 ENCOUNTER — Ambulatory Visit (INDEPENDENT_AMBULATORY_CARE_PROVIDER_SITE_OTHER): Payer: 59 | Admitting: Family Medicine

## 2015-07-05 DIAGNOSIS — R0602 Shortness of breath: Secondary | ICD-10-CM

## 2015-07-05 DIAGNOSIS — J45901 Unspecified asthma with (acute) exacerbation: Secondary | ICD-10-CM | POA: Insufficient documentation

## 2015-07-05 DIAGNOSIS — J4531 Mild persistent asthma with (acute) exacerbation: Secondary | ICD-10-CM | POA: Diagnosis not present

## 2015-07-05 MED ORDER — IPRATROPIUM BROMIDE 0.02 % IN SOLN
0.5000 mg | Freq: Once | RESPIRATORY_TRACT | Status: AC
  Administered 2015-07-05: 0.5 mg via RESPIRATORY_TRACT

## 2015-07-05 MED ORDER — HYDROCODONE-HOMATROPINE 5-1.5 MG/5ML PO SYRP: 5.0000 mL | ORAL_SOLUTION | Freq: Three times a day (TID) | ORAL | Status: DC | PRN

## 2015-07-05 MED ORDER — ALBUTEROL SULFATE (2.5 MG/3ML) 0.083% IN NEBU: 2.5000 mg | INHALATION_SOLUTION | Freq: Four times a day (QID) | RESPIRATORY_TRACT | Status: DC | PRN

## 2015-07-05 MED ORDER — DOXYCYCLINE HYCLATE 100 MG PO TABS: 100.0000 mg | ORAL_TABLET | Freq: Two times a day (BID) | ORAL | Status: DC

## 2015-07-05 MED ORDER — FLUTICASONE PROPIONATE HFA 110 MCG/ACT IN AERO: 2.0000 | INHALATION_SPRAY | Freq: Two times a day (BID) | RESPIRATORY_TRACT | Status: DC

## 2015-07-05 MED ORDER — PREDNISONE 10 MG PO TABS: ORAL_TABLET | ORAL | Status: DC

## 2015-07-05 MED ORDER — ALBUTEROL SULFATE (2.5 MG/3ML) 0.083% IN NEBU
2.5000 mg | INHALATION_SOLUTION | Freq: Once | RESPIRATORY_TRACT | Status: AC
  Administered 2015-07-05: 2.5 mg via RESPIRATORY_TRACT

## 2015-07-05 NOTE — Assessment & Plan Note (Signed)
Patient treated in the clinic with DuoNeb with improvement in breathing and chest tightness/shortness of breath. Chest x-ray ordered and reviewed personally. No infiltrate noted. There may be a potential component of COPD given smoking history. Given severiyy and persistence of symptoms will treat with Prednisone, Doxycycline.  Hycodan for cough.

## 2015-07-05 NOTE — Patient Instructions (Signed)
Take the antibiotic, prednisone, and cough syrup as prescribed.  Be sure to get your xray.  Follow up in 1-2 weeks or sooner if you fail to improve.  Take care  Dr. Lacinda Axon

## 2015-07-05 NOTE — Progress Notes (Signed)
Subjective:  Patient ID: Heather [name], female    DOB: [birth year]  Age: 39 y.o. MRN: [id number]  CC: Cough, congestion, SOB  HPI:  39 year old female with a PMH of asthma presents to clinic today for an acute visit with the above complaints.  Patient reports a two-week history of cough, congestion, and shortness of breath.  Patient went to the ED on 9/30 for evaluation. She was treated for an acute asthma exacerbation and was given prednisone and azithromycin.  Patient reports that she had some mild improvement with treatment but this quickly dissipated.   She presents today with continued symptoms. Cough is persistent and severe. She continues to have shortness of breath and is using albuterol 6-7 times daily with little relief. No known exacerbating factors. No relieving factors. She denies any fever.  Social Hx   Social History   Social History  . Marital Status: Legally Separated    Spouse Name: N/A  . Number of Children: N/A  . Years of Education: N/A   Social History Main Topics  . Smoking status: Former Smoker -- 0.50 packs/day    Types: Cigarettes  . Smokeless tobacco: Never Used     Comment: Quit mid October 2014  . Alcohol Use: No  . Drug Use: No  . Sexual Activity: Not Asked   Other Topics Concern  . None   Social History Narrative   Review of Systems  Constitutional: Negative for fever.  Respiratory: Positive for cough and shortness of breath.    Objective:  BP 130/82 mmHg  Pulse 69  Temp(Src) 98.5 F (36.9 C) (Oral)  Ht 5\' 6"  (1.676 m)  Wt 166 lb 6 oz (75.467 kg)  BMI 26.87 kg/m2  SpO2 96%  LMP 06/11/2015  BP/Weight 07/05/2015 06/25/2015 56/38/7564  Systolic BP 332 951 884  Diastolic BP 82 83 82  Wt. (Lbs) 166.38 155 162  BMI 26.87 25.39 26.16   Physical Exam  Constitutional: She is oriented to person, place, and time.  Labored breathing with audible wheezing.  HENT:  Head: Normocephalic and atraumatic.  Oropharynx clear. Normal TMs  bilaterally.  Cardiovascular: Normal rate and regular rhythm.   No murmur heard. Pulmonary/Chest:  Increased work of breathing. Diffuse wheezing noted.  Neurological: [name] and oriented to person, place, and time.  Vitals reviewed.   Lab Results  Component Value Date   WBC 16.8* 06/25/2015   HGB 14.4 06/25/2015   HCT 42.4 06/25/2015   PLT 277 06/25/2015   GLUCOSE 112* 06/25/2015   ALT 49 06/19/2014   AST 32 06/19/2014   NA 139 06/25/2015   K 3.5 06/25/2015   CL 111 06/25/2015   CREATININE 0.76 06/25/2015   BUN 11 06/25/2015   CO2 23 06/25/2015   Dg Chest 2 View  07/05/2015   CLINICAL DATA:  Cough and shortness of breath for the past 2 weeks, history of asthma, former smoker.  EXAM: CHEST  2 VIEW  COMPARISON:  Chest x-ray of June 25, 2015  FINDINGS: The lungs are mildly hyperinflated and clear. There is no focal infiltrate. The heart and pulmonary vascularity are normal. The mediastinum is normal in width. The bony thorax is unremarkable.  IMPRESSION: Mild hyperinflation consistent with known reactive airway disease. There is no evidence of pneumonia nor other acute cardiopulmonary abnormality.   Electronically Signed   By: David  Martinique M.D.   On: 07/05/2015 12:29   Dg Chest 2 View  06/25/2015   CLINICAL DATA:  Asthma and cough  EXAM: CHEST  2 VIEW  COMPARISON:  07/27/2014  FINDINGS: Suspect central airway thickening with cuffing and tram track appearance perihilar. No collapse or consolidation. No edema, effusion, or air leak. Normal heart size and mediastinal contours.  IMPRESSION: Mild bronchitic change.  No pneumonia or collapse.   Electronically Signed   By: Monte Fantasia M.D.   On: 06/25/2015 23:10    Assessment & Plan:   Problem List Items Addressed This Visit    Asthma with acute exacerbation    Patient treated in the clinic with DuoNeb with improvement in breathing and chest tightness/shortness of breath. Chest x-ray ordered and reviewed personally. No  infiltrate noted. There may be a potential component of COPD given smoking history. Given severiyy and persistence of symptoms will treat with Prednisone, Doxycycline.  Hycodan for cough.        Relevant Medications   albuterol (PROVENTIL) (2.5 MG/3ML) 0.083% nebulizer solution   fluticasone (FLOVENT HFA) 110 MCG/ACT inhaler   predniSONE (DELTASONE) 10 MG tablet   albuterol (PROVENTIL) (2.5 MG/3ML) 0.083% nebulizer solution 2.5 mg (Completed)   ipratropium (ATROVENT) nebulizer solution 0.5 mg (Completed)   Other Relevant Orders   DG Chest 2 View (Completed)      Meds ordered this encounter  Medications  . albuterol (PROVENTIL) (2.5 MG/3ML) 0.083% nebulizer solution    Sig: Take 3 mLs (2.5 mg total) by nebulization every 6 (six) hours as needed for wheezing or shortness of breath.    Dispense:  75 mL    Refill:  2  . fluticasone (FLOVENT HFA) 110 MCG/ACT inhaler    Sig: Inhale 2 puffs into the lungs 2 (two) times daily.    Dispense:  1 Inhaler    Refill:  3  . predniSONE (DELTASONE) 10 MG tablet    Sig: 50 mg (5 tablets) x 3 days, 40 mg (4 tablets) x 3 days, 20 mg (2 tablets) x 3 days, 10 mg (1 tablet) x 3 days then stop.    Dispense:  36 tablet    Refill:  0  . doxycycline (VIBRA-TABS) 100 MG tablet    Sig: Take 1 tablet (100 mg total) by mouth 2 (two) times daily.    Dispense:  20 tablet    Refill:  0  . HYDROcodone-homatropine (HYCODAN) 5-1.5 MG/5ML syrup    Sig: Take 5 mLs by mouth every 8 (eight) hours as needed for cough.    Dispense:  120 mL    Refill:  0  . albuterol (PROVENTIL) (2.5 MG/3ML) 0.083% nebulizer solution 2.5 mg    Sig:   . ipratropium (ATROVENT) nebulizer solution 0.5 mg    Sig:     Follow-up: PRN  Thersa Salt, DO

## 2015-07-05 NOTE — Progress Notes (Signed)
Pre visit review using our clinic review tool, if applicable. No additional management support is needed unless otherwise documented below in the visit note. 

## 2015-07-20 ENCOUNTER — Telehealth: Payer: Self-pay | Admitting: Internal Medicine

## 2015-07-20 ENCOUNTER — Ambulatory Visit (INDEPENDENT_AMBULATORY_CARE_PROVIDER_SITE_OTHER): Payer: 59 | Admitting: Surgical

## 2015-07-20 DIAGNOSIS — Z111 Encounter for screening for respiratory tuberculosis: Secondary | ICD-10-CM | POA: Diagnosis not present

## 2015-07-20 NOTE — Progress Notes (Signed)
Patient came in today for TB skin Test for her job. She works at a daycare. TB test given in right forearm. Patient tolerated well

## 2015-07-20 NOTE — Addendum Note (Signed)
Addended by: Durwin Glaze on: 07/20/2015 03:07 PM   Modules accepted: Level of Service

## 2015-07-22 ENCOUNTER — Ambulatory Visit: Payer: 59

## 2015-07-23 ENCOUNTER — Telehealth: Payer: Self-pay | Admitting: Internal Medicine

## 2015-07-23 ENCOUNTER — Ambulatory Visit (INDEPENDENT_AMBULATORY_CARE_PROVIDER_SITE_OTHER): Payer: 59 | Admitting: Surgical

## 2015-07-23 DIAGNOSIS — Z111 Encounter for screening for respiratory tuberculosis: Secondary | ICD-10-CM | POA: Diagnosis not present

## 2015-07-23 LAB — READ PPD: TB Skin Test: NEGATIVE

## 2015-07-23 NOTE — Telephone Encounter (Signed)
Scheduled on nurse schedule

## 2015-07-23 NOTE — Telephone Encounter (Signed)
Ok. Linna Caprice I see it. Thanks.

## 2015-07-23 NOTE — Addendum Note (Signed)
Addended by: Durwin Glaze on: 07/23/2015 05:18 PM   Modules accepted: Level of Service

## 2015-07-23 NOTE — Telephone Encounter (Signed)
Pt states she can come in at 4:pm to get her TB test read. Thank You!

## 2015-08-18 ENCOUNTER — Encounter: Payer: Self-pay | Admitting: Family Medicine

## 2015-08-18 ENCOUNTER — Ambulatory Visit (INDEPENDENT_AMBULATORY_CARE_PROVIDER_SITE_OTHER): Payer: 59 | Admitting: Family Medicine

## 2015-08-18 VITALS — BP 132/84 | HR 92 | Temp 98.7°F | Ht 66.0 in | Wt 167.2 lb

## 2015-08-18 DIAGNOSIS — M25512 Pain in left shoulder: Secondary | ICD-10-CM | POA: Diagnosis not present

## 2015-08-18 DIAGNOSIS — J4531 Mild persistent asthma with (acute) exacerbation: Secondary | ICD-10-CM | POA: Diagnosis not present

## 2015-08-18 MED ORDER — PREDNISONE 50 MG PO TABS
ORAL_TABLET | ORAL | Status: DC
Start: 2015-08-18 — End: 2015-08-24

## 2015-08-18 MED ORDER — CYCLOBENZAPRINE HCL 5 MG PO TABS: 5.0000 mg | ORAL_TABLET | Freq: Three times a day (TID) | ORAL | Status: DC | PRN

## 2015-08-18 MED ORDER — HYDROCODONE-HOMATROPINE 5-1.5 MG/5ML PO SYRP: 5.0000 mL | ORAL_SOLUTION | Freq: Three times a day (TID) | ORAL | Status: DC | PRN

## 2015-08-18 NOTE — Progress Notes (Signed)
Pre visit review using our clinic review tool, if applicable. No additional management support is needed unless otherwise documented below in the visit note. 

## 2015-08-18 NOTE — Progress Notes (Signed)
Subjective:  Patient ID: Heather [name], female    DOB: 05/24/1976  Age: 39 y.o. MRN: [id number]  CC: Cough, congestion, wheezing/shortness of breath  HPI:  39 year old female with a past medical history of asthma presents to the clinic today with the above complaints.  Patient reports that her symptoms started on Thursday. She did having significant cough and congestion. She's been taking Mucinex for her symptoms with little improvement. Additionally, she's been having to use albuterol more frequently due to wheezing and shortness of breath. No reported fevers or chills.   Social Hx   Social History   Social History  . Marital Status: Legally Separated    Spouse Name: N/A  . Number of Children: N/A  . Years of Education: N/A   Social History Main Topics  . Smoking status: Former Smoker -- 0.50 packs/day    Types: Cigarettes  . Smokeless tobacco: Never Used     Comment: Quit mid October 2014  . Alcohol Use: No  . Drug Use: No  . Sexual Activity: Not Asked   Other Topics Concern  . None   Social History Narrative   Review of Systems  Constitutional: Negative for fever and chills.  HENT: Positive for congestion.   Respiratory: Positive for cough and shortness of breath.     Objective:  BP 132/84 mmHg  Pulse 92  Temp(Src) 98.7 F (37.1 C) (Oral)  Ht 5\' 6"  (1.676 m)  Wt 167 lb 4 oz (75.864 kg)  BMI 27.01 kg/m2  SpO2 97%  BP/Weight 08/18/2015 07/05/2015 [id number]  Systolic BP [id number]  Diastolic BP 84 82 83  Wt. (Lbs) 167.25 166.38 155  BMI 27.01 26.87 25.39   Physical Exam  Constitutional: She appears well-developed. No distress.  HENT:  Head: Normocephalic and atraumatic.  Mouth/Throat: No oropharyngeal exudate.  Cardiovascular: Regular rhythm.  Tachycardia present.   Pulmonary/Chest:  Mild increased work of breathing. Scattered wheezing noted.  Neurological: [name].  Psychiatric: She has a normal mood and affect.  Vitals reviewed.  Lab  Results  Component Value Date   WBC 16.8* 06/25/2015   HGB 14.4 06/25/2015   HCT 42.4 06/25/2015   PLT 277 06/25/2015   GLUCOSE 112* 06/25/2015   ALT 49 06/19/2014   AST 32 06/19/2014   NA 139 06/25/2015   K 3.5 06/25/2015   CL 111 06/25/2015   CREATININE 0.76 06/25/2015   BUN 11 06/25/2015   CO2 23 06/25/2015   Assessment & Plan:   Problem List Items Addressed This Visit    Asthma with acute exacerbation - Primary    Patient was symptoms of viral respiratory illness which has resulted in mild asthma exacerbation.  Treated with prednisone and Hycodan. Advise continue use of albuterol and Flovent. Follow up PRN.      Relevant Medications   predniSONE (DELTASONE) 50 MG tablet    Other Visit Diagnoses    Pain in joint of left shoulder        Relevant Medications    cyclobenzaprine (FLEXERIL) 5 MG tablet       Meds ordered this encounter  Medications  . HYDROcodone-homatropine (HYCODAN) 5-1.5 MG/5ML syrup    Sig: Take 5 mLs by mouth every 8 (eight) hours as needed for cough.    Dispense:  120 mL    Refill:  0  . predniSONE (DELTASONE) 50 MG tablet    Sig: 1 tablet daily x 5 days.    Dispense:  5 tablet    Refill:  0  . cyclobenzaprine (FLEXERIL) 5 MG tablet    Sig: Take 1 tablet (5 mg total) by mouth 3 (three) times daily as needed for muscle spasms.    Dispense:  30 tablet    Refill:  0    Follow-up: Return if symptoms worsen or fail to improve.  [place]

## 2015-08-18 NOTE — Assessment & Plan Note (Signed)
Patient was symptoms of viral respiratory illness which has resulted in mild asthma exacerbation.  Treated with prednisone and Hycodan. Advise continue use of albuterol and Flovent. Follow up PRN.

## 2015-08-18 NOTE — Patient Instructions (Signed)
It was nice to see you today.  This is likely viral in origin and has caused a flare of your asthma (which is contributing to the cough).  Take the prednisone as prescribed.  Continue using your inhalers/nebs. Use the cough syrup as needed.   Follow up:  Return if symptoms worsen or fail to improve.  Take care  Dr. Lacinda Axon

## 2015-08-24 ENCOUNTER — Ambulatory Visit (INDEPENDENT_AMBULATORY_CARE_PROVIDER_SITE_OTHER): Payer: 59 | Admitting: Nurse Practitioner

## 2015-08-24 VITALS — BP 124/80 | HR 98 | Temp 98.2°F | Wt 168.8 lb

## 2015-08-24 DIAGNOSIS — J4531 Mild persistent asthma with (acute) exacerbation: Secondary | ICD-10-CM

## 2015-08-24 DIAGNOSIS — B9789 Other viral agents as the cause of diseases classified elsewhere: Secondary | ICD-10-CM

## 2015-08-24 DIAGNOSIS — J069 Acute upper respiratory infection, unspecified: Secondary | ICD-10-CM

## 2015-08-24 MED ORDER — ALBUTEROL SULFATE HFA 108 (90 BASE) MCG/ACT IN AERS: 2.0000 | INHALATION_SPRAY | Freq: Four times a day (QID) | RESPIRATORY_TRACT | Status: DC | PRN

## 2015-08-24 MED ORDER — PREDNISONE 50 MG PO TABS: ORAL_TABLET | ORAL | Status: DC

## 2015-08-24 MED ORDER — DOXYCYCLINE HYCLATE 100 MG PO TABS: 100.0000 mg | ORAL_TABLET | Freq: Two times a day (BID) | ORAL | Status: DC

## 2015-08-24 NOTE — Progress Notes (Signed)
Patient ID: Heather [name], female    DOB: 1975-12-13  Age: 39 y.o. MRN: [id number]  CC: Cough   HPI Heather [name] presents for CC of cough and congestion.   1) Cough and congestion x 1.5 weeks. She has not improved much since last visit the reports. She was seen on 08/18/15 for asthma acute exacerbation. She was given prednisone and Hycodan (still has plenty she had the bottle).   Pain in left shoulder from coughing she reports. It only hurts while coughing. Pt reports she used her inhalers and nebulizer this morning as well as yesterday. She reports finishing her Prednisone on Sunday.   History Heather [name] has a past medical history of Depression; Migraines; Asthma; Seizures ([place]); H/O cardiac arrhythmia; and Fainting spell.   She has past surgical history that includes Tubal ligation (Bilateral, 09/2002).   Her family history includes Arthritis in her mother; Diabetes in her mother; Heart disease in her mother; Hypertension in her mother.She reports that she has quit smoking. Her smoking use included Cigarettes. She smoked 0.50 packs per day. She has never used smokeless tobacco. She reports that she does not drink alcohol or use illicit drugs.  Outpatient Prescriptions Prior to Visit  Medication Sig Dispense Refill  . albuterol (PROVENTIL) (2.5 MG/3ML) 0.083% nebulizer solution Take 3 mLs (2.5 mg total) by nebulization every 6 (six) hours as needed for wheezing or shortness of breath. 75 mL 2  . amitriptyline (ELAVIL) 25 MG tablet Take 1 tablet (25 mg total) by mouth at bedtime. Increase to 2 tablets after 5 days if needed 60 tablet 3  . cyclobenzaprine (FLEXERIL) 5 MG tablet Take 1 tablet (5 mg total) by mouth 3 (three) times daily as needed for muscle spasms. 30 tablet 0  . diazepam (VALIUM) 2 MG tablet 1-2 tablets daily as needed    . fluticasone (FLOVENT HFA) 110 MCG/ACT inhaler Inhale 2 puffs into the lungs 2 (two) times daily. 1 Inhaler 3  . HYDROcodone-homatropine (HYCODAN) 5-1.5  MG/5ML syrup Take 5 mLs by mouth every 8 (eight) hours as needed for cough. 120 mL 0  . ibuprofen (ADVIL,MOTRIN) 200 MG tablet Take 200 mg by mouth every 6 (six) hours as needed.    . naproxen sodium (ANAPROX) 220 MG tablet Take 220 mg by mouth 2 (two) times daily with a meal.    . promethazine (PHENERGAN) 12.5 MG tablet Take 1 tablet (12.5 mg total) by mouth every 8 (eight) hours as needed for nausea. 20 tablet 0  . albuterol (PROVENTIL HFA;VENTOLIN HFA) 108 (90 BASE) MCG/ACT inhaler Inhale 2 puffs into the lungs every 6 (six) hours as needed for wheezing or shortness of breath. 1 Inhaler 0  . predniSONE (DELTASONE) 50 MG tablet 1 tablet daily x 5 days. 5 tablet 0   No facility-administered medications prior to visit.    ROS Review of Systems  Constitutional: Negative for fever, chills, diaphoresis and fatigue.  HENT: Positive for congestion. Negative for ear discharge and ear pain.   Eyes: Negative for visual disturbance.  Respiratory: Positive for cough, shortness of breath and wheezing. Negative for chest tightness.     Objective:  BP 124/80 mmHg  Pulse 98  Temp(Src) 98.2 F (36.8 C) (Oral)  Wt 168 lb 12.8 oz (76.567 kg)  SpO2 97%Pulse 109 down to 98 on re-check  Physical Exam  Constitutional: She is oriented to person, place, and time. She appears well-developed and well-nourished. No distress.  HENT:  Head: Normocephalic and atraumatic.  Right Ear: External  ear normal.  Left Ear: External ear normal.  Mouth/Throat: No oropharyngeal exudate.  TM's clear bilaterally  Eyes: EOM are normal. Pupils are equal, round, and reactive to light. Right eye exhibits no discharge. Left eye exhibits no discharge. No scleral icterus.  Neck: Normal range of motion. Neck supple.  Cardiovascular: Normal rate, regular rhythm and normal heart sounds.  Exam reveals no gallop and no friction rub.   No murmur heard. Pulmonary/Chest: Effort normal. No respiratory distress. She has wheezes. She has  no rales. She exhibits no tenderness.  Lymphadenopathy:    She has no cervical adenopathy.  Neurological: [name] and oriented to person, place, and time.  Skin: Skin is warm and dry. No rash noted. She is not diaphoretic.  Psychiatric: She has a normal mood and affect. Her behavior is normal. Judgment and thought content normal.   Assessment & Plan:   Heather [name] was seen today for cough.  Diagnoses and all orders for this visit:  Asthma with acute exacerbation, mild persistent  Viral URI with cough  Other orders -     doxycycline (VIBRA-TABS) 100 MG tablet; Take 1 tablet (100 mg total) by mouth 2 (two) times daily. -     predniSONE (DELTASONE) 50 MG tablet; 1 tablet daily x 5 days. -     Discontinue: albuterol (PROVENTIL HFA;VENTOLIN HFA) 108 (90 BASE) MCG/ACT inhaler; Inhale 2 puffs into the lungs every 6 (six) hours as needed for wheezing or shortness of breath. -     albuterol (PROVENTIL HFA;VENTOLIN HFA) 108 (90 BASE) MCG/ACT inhaler; Inhale 2 puffs into the lungs every 6 (six) hours as needed for wheezing or shortness of breath.  I am having Heather [name] start on doxycycline. I am also having her maintain her diazepam, promethazine, amitriptyline, naproxen sodium, ibuprofen, albuterol, fluticasone, HYDROcodone-homatropine, cyclobenzaprine, predniSONE, and albuterol.  Meds ordered this encounter  Medications  . doxycycline (VIBRA-TABS) 100 MG tablet    Sig: Take 1 tablet (100 mg total) by mouth 2 (two) times daily.    Dispense:  14 tablet    Refill:  0    Order Specific Question:  Supervising Provider    Answer:  Deborra Medina L [[id number]]  . predniSONE (DELTASONE) 50 MG tablet    Sig: 1 tablet daily x 5 days.    Dispense:  5 tablet    Refill:  0    Order Specific Question:  Supervising Provider    Answer:  Derrel Nip, TERESA L [[id number]]  . DISCONTD: albuterol (PROVENTIL HFA;VENTOLIN HFA) 108 (90 BASE) MCG/ACT inhaler    Sig: Inhale 2 puffs into the lungs every 6 (six) hours as  needed for wheezing or shortness of breath.    Dispense:  1 Inhaler    Refill:  0    Order Specific Question:  Supervising Provider    Answer:  Derrel Nip, TERESA L [[id number]]  . albuterol (PROVENTIL HFA;VENTOLIN HFA) 108 (90 BASE) MCG/ACT inhaler    Sig: Inhale 2 puffs into the lungs every 6 (six) hours as needed for wheezing or shortness of breath.    Dispense:  1 Inhaler    Refill:  0    Order Specific Question:  Supervising Provider    Answer:  Crecencio Mc [[id number]]     Follow-up: Return if symptoms worsen or fail to improve.

## 2015-08-24 NOTE — Patient Instructions (Signed)
Doxycyline twice daily by mouth for 7 days.   Hycodan syrup 1 teaspoon at night.   Mucinex-D continue   Rest and good fluids!   Prednisone taper given and inhaler refilled.   Call us if not improved, if you worsen after hours or on weekends please go to the ER or Urgent Care.

## 2015-08-29 ENCOUNTER — Encounter: Payer: Self-pay | Admitting: Nurse Practitioner

## 2015-08-29 NOTE — Assessment & Plan Note (Signed)
Same symptoms as last time. Prednisone round 2 sent into pharmacy. Ventolin inhaler called into pharmacy. Pt seems stable and only coughs when she has her mask on. She still has Hycodan syrup for cough. Continue use of inhalers and nebulizer. FU prn worsening/failure to improve.

## 2015-08-29 NOTE — Assessment & Plan Note (Signed)
Probable viral illness exacerbating the asthma, but due to length of time and co-morbidities will cover with doxycyline twice daily x 7 days. Encouraged probiotics. FU prn worsening/failure to improve.

## 2015-09-29 ENCOUNTER — Encounter: Payer: Self-pay | Admitting: Internal Medicine

## 2015-09-29 ENCOUNTER — Ambulatory Visit (INDEPENDENT_AMBULATORY_CARE_PROVIDER_SITE_OTHER): Payer: 59 | Admitting: Internal Medicine

## 2015-09-29 VITALS — BP 120/80 | HR 100 | Temp 98.0°F | Resp 18 | Ht 66.0 in | Wt 168.0 lb

## 2015-09-29 DIAGNOSIS — J4520 Mild intermittent asthma, uncomplicated: Secondary | ICD-10-CM | POA: Diagnosis not present

## 2015-09-29 DIAGNOSIS — G40909 Epilepsy, unspecified, not intractable, without status epilepticus: Secondary | ICD-10-CM | POA: Diagnosis not present

## 2015-09-29 DIAGNOSIS — Z658 Other specified problems related to psychosocial circumstances: Secondary | ICD-10-CM

## 2015-09-29 DIAGNOSIS — Z319 Encounter for procreative management, unspecified: Secondary | ICD-10-CM

## 2015-09-29 DIAGNOSIS — D72829 Elevated white blood cell count, unspecified: Secondary | ICD-10-CM | POA: Diagnosis not present

## 2015-09-29 DIAGNOSIS — F439 Reaction to severe stress, unspecified: Secondary | ICD-10-CM

## 2015-09-29 DIAGNOSIS — Z1239 Encounter for other screening for malignant neoplasm of breast: Secondary | ICD-10-CM

## 2015-09-29 DIAGNOSIS — G43809 Other migraine, not intractable, without status migrainosus: Secondary | ICD-10-CM

## 2015-09-29 LAB — CBC WITH DIFFERENTIAL/PLATELET
BASOS ABS: 0.1 10*3/uL (ref 0.0–0.1)
BASOS PCT: 0.5 % (ref 0.0–3.0)
EOS ABS: 0.1 10*3/uL (ref 0.0–0.7)
Eosinophils Relative: 0.5 % (ref 0.0–5.0)
HCT: 43.3 % (ref 36.0–46.0)
Hemoglobin: 14.3 g/dL (ref 12.0–15.0)
LYMPHS ABS: 1.7 10*3/uL (ref 0.7–4.0)
Lymphocytes Relative: 14.6 % (ref 12.0–46.0)
MCHC: 33.1 g/dL (ref 30.0–36.0)
MCV: 84.3 fl (ref 78.0–100.0)
Monocytes Absolute: 0.7 10*3/uL (ref 0.1–1.0)
Monocytes Relative: 5.5 % (ref 3.0–12.0)
NEUTROS ABS: 9.4 10*3/uL — AB (ref 1.4–7.7)
NEUTROS PCT: 78.9 % — AB (ref 43.0–77.0)
PLATELETS: 261 10*3/uL (ref 150.0–400.0)
RBC: 5.14 Mil/uL — ABNORMAL HIGH (ref 3.87–5.11)
RDW: 14.4 % (ref 11.5–15.5)
WBC: 11.9 10*3/uL — ABNORMAL HIGH (ref 4.0–10.5)

## 2015-09-29 MED ORDER — ALBUTEROL SULFATE HFA 108 (90 BASE) MCG/ACT IN AERS: 2.0000 | INHALATION_SPRAY | Freq: Four times a day (QID) | RESPIRATORY_TRACT | Status: DC | PRN

## 2015-09-29 MED ORDER — FLUTICASONE PROPIONATE HFA 110 MCG/ACT IN AERO: 2.0000 | INHALATION_SPRAY | Freq: Two times a day (BID) | RESPIRATORY_TRACT | Status: DC

## 2015-09-29 NOTE — Progress Notes (Signed)
Patient ID: Heather [name], female   DOB: 03/14/1976, 40 y.o.   MRN: [id number]   Subjective:    Patient ID: Heather [name], female    DOB: [birth year], 40 y.o.   MRN: [id number]  HPI  Patient with past history of seizures, migraines and depression.  She comes in today to follow up on these issues.  I have not seen her since 2014.  She is doing better.  Stress better.  Divorced from her husband.  In a new relationship.  Is happy.  Decreased stress.  Some increased stress with her daughter's health issues, but overall handling things well.  Recently treated for respiratory infection.  Breathing better now.  No increased cough or congestion.  Quit smoking 1 1/2 - 2 years ago.  Using flovent and albuterol.  No abdominal pain or cramping.  Bowels stable.  Is followed at [facility] for her gyn care.  Last seen one year ago.  Contemplating pregnancy.  Discussed pre natal vitamins and avoidance of certain medications.  Bowels stable.  No urinary or vaginal problems.     Past Medical History  Diagnosis Date  . Depression     bipolar  . Migraines   . Asthma   . Seizures ([place])   . H/O cardiac arrhythmia   . Fainting spell     H/O   Past Surgical History  Procedure Laterality Date  . Tubal ligation Bilateral 09/2002   Family History  Problem Relation Age of Onset  . Hypertension Mother   . Diabetes Mother   . Arthritis Mother   . Heart disease Mother    Social History   Social History  . Marital Status: Legally Separated    Spouse Name: N/A  . Number of Children: N/A  . Years of Education: N/A   Social History Main Topics  . Smoking status: Former Smoker -- 0.50 packs/day    Types: Cigarettes  . Smokeless tobacco: Never Used     Comment: Quit mid October 2014  . Alcohol Use: No  . Drug Use: No  . Sexual Activity: Not Asked   Other Topics Concern  . None   Social History Narrative    Outpatient Encounter Prescriptions as of 09/29/2015  Medication Sig  . albuterol (PROVENTIL  HFA;VENTOLIN HFA) 108 (90 Base) MCG/ACT inhaler Inhale 2 puffs into the lungs every 6 (six) hours as needed for wheezing or shortness of breath.  Marland Kitchen albuterol (PROVENTIL) (2.5 MG/3ML) 0.083% nebulizer solution Take 3 mLs (2.5 mg total) by nebulization every 6 (six) hours as needed for wheezing or shortness of breath.  . cyclobenzaprine (FLEXERIL) 5 MG tablet Take 1 tablet (5 mg total) by mouth 3 (three) times daily as needed for muscle spasms.  . diazepam (VALIUM) 2 MG tablet 1-2 tablets daily as needed  . fluticasone (FLOVENT HFA) 110 MCG/ACT inhaler Inhale 2 puffs into the lungs 2 (two) times daily.  . [DISCONTINUED] albuterol (PROVENTIL HFA;VENTOLIN HFA) 108 (90 BASE) MCG/ACT inhaler Inhale 2 puffs into the lungs every 6 (six) hours as needed for wheezing or shortness of breath.  . [DISCONTINUED] amitriptyline (ELAVIL) 25 MG tablet Take 1 tablet (25 mg total) by mouth at bedtime. Increase to 2 tablets after 5 days if needed (Patient not taking: Reported on 09/29/2015)  . [DISCONTINUED] doxycycline (VIBRA-TABS) 100 MG tablet Take 1 tablet (100 mg total) by mouth 2 (two) times daily. (Patient not taking: Reported on 09/29/2015)  . [DISCONTINUED] fluticasone (FLOVENT HFA) 110 MCG/ACT inhaler Inhale 2 puffs into the  lungs 2 (two) times daily.  . [DISCONTINUED] HYDROcodone-homatropine (HYCODAN) 5-1.5 MG/5ML syrup Take 5 mLs by mouth every 8 (eight) hours as needed for cough. (Patient not taking: Reported on 09/29/2015)  . [DISCONTINUED] ibuprofen (ADVIL,MOTRIN) 200 MG tablet Take 200 mg by mouth every 6 (six) hours as needed. Reported on 09/29/2015  . [DISCONTINUED] naproxen sodium (ANAPROX) 220 MG tablet Take 220 mg by mouth 2 (two) times daily with a meal. Reported on 09/29/2015  . [DISCONTINUED] predniSONE (DELTASONE) 50 MG tablet 1 tablet daily x 5 days. (Patient not taking: Reported on 09/29/2015)  . [DISCONTINUED] promethazine (PHENERGAN) 12.5 MG tablet Take 1 tablet (12.5 mg total) by mouth every 8 (eight)  hours as needed for nausea. (Patient not taking: Reported on 09/29/2015)   No facility-administered encounter medications on file as of 09/29/2015.    Review of Systems  Constitutional: Negative for appetite change and unexpected weight change.  HENT: Negative for congestion and sinus pressure.   Eyes: Negative for discharge and redness.  Respiratory: Negative for cough, chest tightness and shortness of breath.   Cardiovascular: Negative for chest pain, palpitations and leg swelling.  Gastrointestinal: Negative for nausea, vomiting, abdominal pain and diarrhea.  Genitourinary: Negative for dysuria and difficulty urinating.  Musculoskeletal: Negative for back pain and joint swelling.  Skin: Negative for color change and rash.  Neurological: Negative for dizziness, light-headedness and headaches.  Psychiatric/Behavioral: Negative for dysphoric mood and agitation.       Objective:    Physical Exam  Constitutional: She appears well-developed and well-nourished. No distress.  HENT:  Nose: Nose normal.  Mouth/Throat: Oropharynx is clear and moist.  Eyes: Conjunctivae are normal. Right eye exhibits no discharge. Left eye exhibits no discharge.  Neck: Neck supple. No thyromegaly present.  Cardiovascular: Normal rate and regular rhythm.   Pulmonary/Chest: Breath sounds normal. No respiratory distress. She has no wheezes.  Abdominal: Soft. Bowel sounds are normal. There is no tenderness.  Musculoskeletal: She exhibits no edema or tenderness.  Lymphadenopathy:    She has no cervical adenopathy.  Skin: No rash noted. No erythema.  Psychiatric: She has a normal mood and affect. Her behavior is normal.    BP 120/80 mmHg  Pulse 100  Temp(Src) 98 F (36.7 C) (Oral)  Resp 18  Ht 5\' 6"  (1.676 m)  Wt 168 lb (76.204 kg)  BMI 27.13 kg/m2  SpO2 97%  LMP 08/30/2015 (Exact Date) Wt Readings from Last 3 Encounters:  09/29/15 168 lb (76.204 kg)  08/24/15 168 lb 12.8 oz (76.567 kg)  08/18/15 167  lb 4 oz (75.864 kg)     Lab Results  Component Value Date   WBC 11.9* 09/29/2015   HGB 14.3 09/29/2015   HCT 43.3 09/29/2015   PLT 261.0 09/29/2015   GLUCOSE 112* 06/25/2015   ALT 49 06/19/2014   AST 32 06/19/2014   NA 139 06/25/2015   K 3.5 06/25/2015   CL 111 06/25/2015   CREATININE 0.76 06/25/2015   BUN 11 06/25/2015   CO2 23 06/25/2015    Dg Chest 2 View  07/05/2015  CLINICAL DATA:  Cough and shortness of breath for the past 2 weeks, history of asthma, former smoker. EXAM: CHEST  2 VIEW COMPARISON:  Chest x-ray of June 25, 2015 FINDINGS: The lungs are mildly hyperinflated and clear. There is no focal infiltrate. The heart and pulmonary vascularity are normal. The mediastinum is normal in width. The bony thorax is unremarkable. IMPRESSION: Mild hyperinflation consistent with known reactive airway disease. There is  no evidence of pneumonia nor other acute cardiopulmonary abnormality. Electronically Signed   By: David  Martinique M.D.   On: 07/05/2015 12:29       Assessment & Plan:   Problem List Items Addressed This Visit    Asthma    She has quit smoking.  Breathing stable.  On flovent and albuterol.  Follow.        Relevant Medications   fluticasone (FLOVENT HFA) 110 MCG/ACT inhaler   albuterol (PROVENTIL HFA;VENTOLIN HFA) 108 (90 Base) MCG/ACT inhaler   Desire for pregnancy    Discussed with her today.  She is followed at [facility].  Planning to discuss with them.  Discussed pre natal vitamins and avoidance of various medications.        Migraine    No significant problems with headaches now.  Off amitriptyline.        Seizure disorder ([place])    No seizures reported on no medication.        Stress    Increased stress as outlined.  Better.  Follow.         Other Visit Diagnoses    Leukocytosis    -  Primary    Relevant Orders    CBC with Differential/Platelet (Completed)    Breast cancer screening        Relevant Orders    MM DIGITAL SCREENING BILATERAL         Einar Pheasant, MD

## 2015-09-29 NOTE — Progress Notes (Signed)
Pre-visit discussion using our clinic review tool. No additional management support is needed unless otherwise documented below in the visit note.  

## 2015-09-30 ENCOUNTER — Encounter: Payer: Self-pay | Admitting: *Deleted

## 2015-10-03 ENCOUNTER — Encounter: Payer: Self-pay | Admitting: Internal Medicine

## 2015-10-03 DIAGNOSIS — Z319 Encounter for procreative management, unspecified: Secondary | ICD-10-CM | POA: Insufficient documentation

## 2015-10-03 NOTE — Assessment & Plan Note (Signed)
No seizures reported on no medication.

## 2015-10-03 NOTE — Assessment & Plan Note (Signed)
She has quit smoking.  Breathing stable.  On flovent and albuterol.  Follow.

## 2015-10-03 NOTE — Assessment & Plan Note (Signed)
Discussed with her today.  She is followed at [facility].  Planning to discuss with them.  Discussed pre natal vitamins and avoidance of various medications.

## 2015-10-03 NOTE — Assessment & Plan Note (Signed)
No significant problems with headaches now.  Off amitriptyline.

## 2015-10-03 NOTE — Assessment & Plan Note (Signed)
Increased stress as outlined.  Better.  Follow.

## 2015-10-28 ENCOUNTER — Other Ambulatory Visit: Payer: 59

## 2015-11-10 ENCOUNTER — Other Ambulatory Visit: Payer: Self-pay | Admitting: Internal Medicine

## 2015-11-10 DIAGNOSIS — F439 Reaction to severe stress, unspecified: Secondary | ICD-10-CM

## 2015-11-10 DIAGNOSIS — Z13220 Encounter for screening for lipoid disorders: Secondary | ICD-10-CM

## 2015-11-10 NOTE — Progress Notes (Signed)
Orders placed for labs

## 2015-11-11 ENCOUNTER — Other Ambulatory Visit: Payer: 59

## 2015-11-15 ENCOUNTER — Other Ambulatory Visit: Payer: 59

## 2015-12-23 ENCOUNTER — Encounter: Payer: Self-pay | Admitting: Family Medicine

## 2015-12-23 ENCOUNTER — Ambulatory Visit (INDEPENDENT_AMBULATORY_CARE_PROVIDER_SITE_OTHER): Payer: 59 | Admitting: Family Medicine

## 2015-12-23 VITALS — BP 142/80 | HR 108 | Temp 98.3°F | Wt 168.2 lb

## 2015-12-23 DIAGNOSIS — J988 Other specified respiratory disorders: Secondary | ICD-10-CM

## 2015-12-23 MED ORDER — PREDNISONE 50 MG PO TABS: ORAL_TABLET | ORAL | Status: DC

## 2015-12-23 MED ORDER — ALBUTEROL SULFATE HFA 108 (90 BASE) MCG/ACT IN AERS: 2.0000 | INHALATION_SPRAY | Freq: Four times a day (QID) | RESPIRATORY_TRACT | Status: DC | PRN

## 2015-12-23 MED ORDER — DOXYCYCLINE HYCLATE 100 MG PO TABS: 100.0000 mg | ORAL_TABLET | Freq: Two times a day (BID) | ORAL | Status: DC

## 2015-12-23 NOTE — Progress Notes (Signed)
Pre visit review using our clinic review tool, if applicable. No additional management support is needed unless otherwise documented below in the visit note. 

## 2015-12-23 NOTE — Assessment & Plan Note (Signed)
New Problem. Given asthma and duration of illness, treating with prednisone and doxycycline. Refilled albuterol today.

## 2015-12-23 NOTE — Progress Notes (Signed)
Subjective:  Patient ID: Heather [name], female    DOB: 05-28-1976  Age: 40 y.o. MRN: [id number]  CC: Cough, Sinus pressure, SOB  HPI:  40 year old female with a past medical history of asthma presents with the above complaints.  Patient states that she's been sick for 2 weeks. Her symptoms started with sinus pressure and congestion. She has now developed cough cough is productive of discolored sputum. She also reports drainage. She's been taking Mucinex and using her inhalers with no improvement. She reports associated mild shortness of breath. No fevers or chills. No known exacerbating factors. No other complaints at this time.  Social Hx   Social History   Social History  . Marital Status: Legally Separated    Spouse Name: N/A  . Number of Children: N/A  . Years of Education: N/A   Social History Main Topics  . Smoking status: Former Smoker -- 0.50 packs/day    Types: Cigarettes  . Smokeless tobacco: Never Used     Comment: Quit mid October 2014  . Alcohol Use: No  . Drug Use: No  . Sexual Activity: Not Asked   Other Topics Concern  . None   Social History Narrative   Review of Systems  Constitutional: Negative for fever.  HENT: Positive for congestion and sinus pressure.   Respiratory: Positive for cough.    Objective:  BP 142/80 mmHg  Pulse 108  Temp(Src) 98.3 F (36.8 C) (Oral)  Wt 168 lb 4 oz (76.318 kg)  SpO2 99%  BP/Weight 12/23/2015 09/29/2015 [id number]  Systolic BP [id number]  Diastolic BP 80 80 80  Wt. (Lbs) 168.25 168 168.8  BMI 27.17 27.13 27.26   Physical Exam  Constitutional: She is oriented to person, place, and time.  Severe cough; No acute respiratory distress.   HENT:  Head: Normocephalic and atraumatic.  Mouth/Throat: Oropharynx is clear and moist.  Normal TM's bilaterally.   Cardiovascular: Regular rhythm.  Tachycardia present.   Pulmonary/Chest: Effort normal.  No appreciable wheezing. Patient does appear to be increasing her  effort resulting in a forced upper airway expiratory wheeze.  Neurological: [name] and oriented to person, place, and time.  Vitals reviewed.  Lab Results  Component Value Date   WBC 11.9* 09/29/2015   HGB 14.3 09/29/2015   HCT 43.3 09/29/2015   PLT 261.0 09/29/2015   GLUCOSE 112* 06/25/2015   ALT 49 06/19/2014   AST 32 06/19/2014   NA 139 06/25/2015   K 3.5 06/25/2015   CL 111 06/25/2015   CREATININE 0.76 06/25/2015   BUN 11 06/25/2015   CO2 23 06/25/2015    Assessment & Plan:   Problem List Items Addressed This Visit    Respiratory infection - Primary    New Problem. Given asthma and duration of illness, treating with prednisone and doxycycline. Refilled albuterol today.         Meds ordered this encounter  Medications  . predniSONE (DELTASONE) 50 MG tablet    Sig: 1 tablet daily x 5 days.    Dispense:  5 tablet    Refill:  0  . albuterol (PROVENTIL HFA;VENTOLIN HFA) 108 (90 Base) MCG/ACT inhaler    Sig: Inhale 2 puffs into the lungs every 6 (six) hours as needed for wheezing or shortness of breath.    Dispense:  1 Inhaler    Refill:  6  . doxycycline (VIBRA-TABS) 100 MG tablet    Sig: Take 1 tablet (100 mg total) by mouth 2 (two)  times daily.    Dispense:  14 tablet    Refill:  0   Follow-up: PRN  [place]

## 2015-12-23 NOTE — Patient Instructions (Signed)
Take the medication as prescribed.  I have put you on steroids and an antibiotic.  Follow up as needed  Take care  Dr. Lacinda Axon

## 2016-01-27 ENCOUNTER — Ambulatory Visit (INDEPENDENT_AMBULATORY_CARE_PROVIDER_SITE_OTHER): Payer: 59 | Admitting: Internal Medicine

## 2016-01-27 ENCOUNTER — Encounter: Payer: Self-pay | Admitting: Internal Medicine

## 2016-01-27 VITALS — BP 140/86 | HR 84 | Temp 98.1°F | Resp 18 | Ht 66.0 in | Wt 167.0 lb

## 2016-01-27 DIAGNOSIS — Z319 Encounter for procreative management, unspecified: Secondary | ICD-10-CM

## 2016-01-27 DIAGNOSIS — Z658 Other specified problems related to psychosocial circumstances: Secondary | ICD-10-CM

## 2016-01-27 DIAGNOSIS — G43809 Other migraine, not intractable, without status migrainosus: Secondary | ICD-10-CM

## 2016-01-27 DIAGNOSIS — F439 Reaction to severe stress, unspecified: Secondary | ICD-10-CM

## 2016-01-27 DIAGNOSIS — J4520 Mild intermittent asthma, uncomplicated: Secondary | ICD-10-CM

## 2016-01-27 MED ORDER — TRAZODONE HCL 50 MG PO TABS: 25.0000 mg | ORAL_TABLET | Freq: Every evening | ORAL | Status: DC | PRN

## 2016-01-27 NOTE — Progress Notes (Signed)
Patient ID: Heather [name], female   DOB: 08/02/1976, 40 y.o.   MRN: [id number]   Subjective:    Patient ID: Heather [name], female    DOB: May 03, 1976, 40 y.o.   MRN: [id number]  HPI  Patient here for a scheduled follow up.   She has been having headaches.  No headache now.  Normally takes excedrin migraine and tylenol.  She has been having trouble sleeping.  This has been persistent.  Needs something to help break the cycle of not sleeping.  Breathing stable currently.  Using flovent regularly.  Still planning in the future to have another child.  Has postponed for now.  She is followed by gyn.  No abdominal pain or cramping.  Bowels stable.    Past Medical History  Diagnosis Date  . Depression     bipolar  . Migraines   . Asthma   . Seizures ([place])   . H/O cardiac arrhythmia   . Fainting spell     H/O   Past Surgical History  Procedure Laterality Date  . Tubal ligation Bilateral 09/2002   Family History  Problem Relation Age of Onset  . Hypertension Mother   . Diabetes Mother   . Arthritis Mother   . Heart disease Mother    Social History   Social History  . Marital Status: Legally Separated    Spouse Name: N/A  . Number of Children: N/A  . Years of Education: N/A   Social History Main Topics  . Smoking status: Former Smoker -- 0.50 packs/day    Types: Cigarettes  . Smokeless tobacco: Never Used     Comment: Quit mid October 2014  . Alcohol Use: No  . Drug Use: No  . Sexual Activity: Not Asked   Other Topics Concern  . None   Social History Narrative    Outpatient Encounter Prescriptions as of 01/27/2016  Medication Sig  . albuterol (PROVENTIL HFA;VENTOLIN HFA) 108 (90 Base) MCG/ACT inhaler Inhale 2 puffs into the lungs every 6 (six) hours as needed for wheezing or shortness of breath.  Marland Kitchen albuterol (PROVENTIL) (2.5 MG/3ML) 0.083% nebulizer solution Take 3 mLs (2.5 mg total) by nebulization every 6 (six) hours as needed for wheezing or shortness of breath.    . fluticasone (FLOVENT HFA) 110 MCG/ACT inhaler Inhale 2 puffs into the lungs 2 (two) times daily.  . [DISCONTINUED] cyclobenzaprine (FLEXERIL) 5 MG tablet Take 1 tablet (5 mg total) by mouth 3 (three) times daily as needed for muscle spasms.  . [DISCONTINUED] diazepam (VALIUM) 2 MG tablet 1-2 tablets daily as needed  . [DISCONTINUED] doxycycline (VIBRA-TABS) 100 MG tablet Take 1 tablet (100 mg total) by mouth 2 (two) times daily.  . [DISCONTINUED] predniSONE (DELTASONE) 50 MG tablet 1 tablet daily x 5 days.  . traZODone (DESYREL) 50 MG tablet Take 0.5-1 tablets (25-50 mg total) by mouth at bedtime as needed for sleep.   No facility-administered encounter medications on file as of 01/27/2016.    Review of Systems  Constitutional: Negative for appetite change and unexpected weight change.  HENT: Negative for congestion and sinus pressure.   Respiratory: Negative for cough, chest tightness and shortness of breath.   Cardiovascular: Negative for chest pain, palpitations and leg swelling.  Gastrointestinal: Negative for nausea, vomiting, abdominal pain and diarrhea.  Genitourinary: Negative for dysuria and difficulty urinating.  Musculoskeletal: Negative for back pain and joint swelling.  Skin: Negative for color change and rash.  Neurological: Positive for headaches. Negative for dizziness and  light-headedness.  Psychiatric/Behavioral: Positive for sleep disturbance. Negative for dysphoric mood and agitation.       Objective:     Blood pressure rechecked by me:  130/72  Physical Exam  Constitutional: She appears well-developed and well-nourished. No distress.  HENT:  Nose: Nose normal.  Mouth/Throat: Oropharynx is clear and moist.  Neck: Neck supple. No thyromegaly present.  Cardiovascular: Normal rate and regular rhythm.   Pulmonary/Chest: Breath sounds normal. No respiratory distress. She has no wheezes.  Abdominal: Soft. Bowel sounds are normal. There is no tenderness.   Musculoskeletal: She exhibits no edema or tenderness.  Lymphadenopathy:    She has no cervical adenopathy.  Skin: No rash noted. No erythema.  Psychiatric: She has a normal mood and affect. Her behavior is normal.    BP 140/86 mmHg  Pulse 84  Temp(Src) 98.1 F (36.7 C) (Oral)  Resp 18  Ht 5\' 6"  (1.676 m)  Wt 167 lb (75.751 kg)  BMI 26.97 kg/m2  SpO2 99%  LMP 01/22/2016 (Exact Date) Wt Readings from Last 3 Encounters:  02/02/16 167 lb (75.751 kg)  02/02/16 167 lb 12.8 oz (76.114 kg)  02/01/16 169 lb 12.8 oz (77.021 kg)     Lab Results  Component Value Date   WBC 11.3* 02/02/2016   HGB 14.3 02/02/2016   HCT 43.5 02/02/2016   PLT 276 02/02/2016   GLUCOSE 99 02/02/2016   ALT 49 06/19/2014   AST 32 06/19/2014   NA 140 02/02/2016   K 4.1 02/02/2016   CL 106 02/02/2016   CREATININE 0.73 02/02/2016   BUN 9 02/02/2016   CO2 25 02/02/2016    Dg Chest 2 View  07/05/2015  CLINICAL DATA:  Cough and shortness of breath for the past 2 weeks, history of asthma, former smoker. EXAM: CHEST  2 VIEW COMPARISON:  Chest x-ray of June 25, 2015 FINDINGS: The lungs are mildly hyperinflated and clear. There is no focal infiltrate. The heart and pulmonary vascularity are normal. The mediastinum is normal in width. The bony thorax is unremarkable. IMPRESSION: Mild hyperinflation consistent with known reactive airway disease. There is no evidence of pneumonia nor other acute cardiopulmonary abnormality. Electronically Signed   By: David  Martinique M.D.   On: 07/05/2015 12:29       Assessment & Plan:   Problem List Items Addressed This Visit    Asthma    Breathing stable.        Desire for pregnancy    Seeing gyn.  Postponing for now.        Migraine    No headache currently.  Having problems sleeping.  Start trazodone.  See if can break cycle.  No planning pregnancy now.  Follow.  Schedule her to come back soon to reassess.        Relevant Medications   traZODone (DESYREL) 50 MG  tablet   Stress - Primary    Handling stress.  Stress is better.  Follow.           Einar Pheasant, MD

## 2016-01-27 NOTE — Progress Notes (Signed)
Pre-visit discussion using our clinic review tool. No additional management support is needed unless otherwise documented below in the visit note.  

## 2016-02-01 ENCOUNTER — Encounter: Payer: Self-pay | Admitting: Surgical

## 2016-02-01 ENCOUNTER — Ambulatory Visit (INDEPENDENT_AMBULATORY_CARE_PROVIDER_SITE_OTHER): Payer: 59 | Admitting: Family Medicine

## 2016-02-01 ENCOUNTER — Ambulatory Visit (INDEPENDENT_AMBULATORY_CARE_PROVIDER_SITE_OTHER)
Admission: RE | Admit: 2016-02-01 | Discharge: 2016-02-01 | Disposition: A | Discharge status: Home / Self Care | Payer: 59 | Source: Ambulatory Visit | Attending: Family Medicine | Admitting: Family Medicine

## 2016-02-01 ENCOUNTER — Encounter: Payer: Self-pay | Admitting: Family Medicine

## 2016-02-01 VITALS — BP 158/98 | HR 95 | Temp 98.4°F | Resp 22 | Ht 66.0 in | Wt 169.8 lb

## 2016-02-01 DIAGNOSIS — J45901 Unspecified asthma with (acute) exacerbation: Secondary | ICD-10-CM

## 2016-02-01 MED ORDER — PREDNISONE 50 MG PO TABS: 50.0000 mg | ORAL_TABLET | Freq: Every day | ORAL | Status: DC

## 2016-02-01 MED ORDER — DOXYCYCLINE HYCLATE 100 MG PO TABS: 100.0000 mg | ORAL_TABLET | Freq: Two times a day (BID) | ORAL | Status: DC

## 2016-02-01 NOTE — Assessment & Plan Note (Signed)
Patient's symptoms most consistent with acute asthma exacerbation. Could have some level sinus infection or underlying pneumonia given fevers. Oxygen saturation is normal. Mildly increased respiratory rate. Afebrile at this time. Heart rate is in the normal range. Unlikely related to a VTE given normal heart rate and oxygenation in a patient with no risk factors. Given her mildly increased effort and respiratory rate I did discuss evaluation in the emergency room with treatment there though she declined this. She opted for home treatment. We will treat with doxycycline for possible underlying bacterial infection. We will treat with prednisone for asthma exacerbation. She will use her albuterol inhaler at home every 4 hours for the next 2 days and then as needed. We will obtain a chest x-ray to evaluate further for underlying pneumonia. She'll follow-up tomorrow for recheck. She's given return precautions.

## 2016-02-01 NOTE — Progress Notes (Signed)
Patient ID: Heather [name], female   DOB: [birth year], 40 y.o.   MRN: [id number]  Heather Rumps, MD Phone: [phone]  Heather [name] is a 40 y.o. female who presents today for same-day visit.  Patient notes starting late last week she developed some sinus congestion and drainage. She subsequently developed asthma flareup with wheezing and coughing. Notes some shortness of breath mostly with cough. Mild soreness with cough though no chest pain or chest pressure. Notes fever of 102F yesterday. She notes no gross hemoptysis. Does note coughing up some phlegm with tiny specks of blood. No unilateral leg swelling or bilateral leg swelling. No recent surgeries or trips. No history of DVT. LMP was one week ago. Last took her albuterol 45 minutes ago with minimal benefit. She has a history of asthma and has required emergency room evaluation previously.  PMH: Former smoker  ROS see history of present illness  Objective  Physical Exam Filed Vitals:   02/01/16 0951  BP: 158/98  Pulse: 95  Temp: 98.4 F (36.9 C)  Resp: 22    BP Readings from Last 3 Encounters:  02/01/16 158/98  01/27/16 140/86  12/23/15 142/80   Wt Readings from Last 3 Encounters:  02/01/16 169 lb 12.8 oz (77.021 kg)  01/27/16 167 lb (75.751 kg)  12/23/15 168 lb 4 oz (76.318 kg)    Physical Exam  HENT:  Head: Normocephalic and atraumatic.  Right Ear: External ear normal.  Left Ear: External ear normal.  Mouth/Throat: Oropharynx is clear and moist. No oropharyngeal exudate.  Eyes: Conjunctivae are normal. Pupils are equal, round, and reactive to light.  Neck: Neck supple.  Cardiovascular: Normal rate, regular rhythm and normal heart sounds.   Pulmonary/Chest:  Scattered moderate expiratory wheezes, mildly coarse breath sounds, no focal breath sounds, minimal increased effort, no respiratory distress  Lymphadenopathy:    She has no cervical adenopathy.  Neurological: [name]. Gait normal.  Skin: Skin  is warm and dry. She is not diaphoretic.     Assessment/Plan: Please see individual problem list.  Asthma with acute exacerbation Patient's symptoms most consistent with acute asthma exacerbation. Could have some level sinus infection or underlying pneumonia given fevers. Oxygen saturation is normal. Mildly increased respiratory rate. Afebrile at this time. Heart rate is in the normal range. Unlikely related to a VTE given normal heart rate and oxygenation in a patient with no risk factors. Given her mildly increased effort and respiratory rate I did discuss evaluation in the emergency room with treatment there though she declined this. She opted for home treatment. We will treat with doxycycline for possible underlying bacterial infection. We will treat with prednisone for asthma exacerbation. She will use her albuterol inhaler at home every 4 hours for the next 2 days and then as needed. We will obtain a chest x-ray to evaluate further for underlying pneumonia. She'll follow-up tomorrow for recheck. She's given return precautions.    Orders Placed This Encounter  Procedures  . DG Chest 2 View    Standing Status: Future     Number of Occurrences: 1     Standing Expiration Date: 04/02/2017    Order Specific Question:  Reason for Exam (SYMPTOM  OR DIAGNOSIS REQUIRED)    Answer:  cough, wheezing, shortness of breath    Order Specific Question:  Is the patient pregnant?    Answer:  No    Order Specific Question:  Preferred imaging location?    Answer:  [place]  ordered this encounter  Medications  . predniSONE (DELTASONE) 50 MG tablet    Sig: Take 1 tablet (50 mg total) by mouth daily with breakfast.    Dispense:  5 tablet    Refill:  0  . doxycycline (VIBRA-TABS) 100 MG tablet    Sig: Take 1 tablet (100 mg total) by mouth 2 (two) times daily.    Dispense:  14 tablet    Refill:  0    Heather Rumps, MD [place]

## 2016-02-01 NOTE — Progress Notes (Signed)
Pre visit review using our clinic review tool, if applicable. No additional management support is needed unless otherwise documented below in the visit note. 

## 2016-02-01 NOTE — Patient Instructions (Addendum)
Nice to meet you. You are having an asthma exacerbation likely related to a virus. We will obtain a chest x-ray. We will start you on prednisone. You should take your albuterol inhaler 4 hours for the next 2 days. We will start you on doxycycline to treat for potential pneumonia. If you develop chest pain, worsening shortness of breath, fevers, or any new or changing symptoms please seek medical attention.

## 2016-02-02 ENCOUNTER — Emergency Department: Payer: 59

## 2016-02-02 ENCOUNTER — Ambulatory Visit (INDEPENDENT_AMBULATORY_CARE_PROVIDER_SITE_OTHER): Payer: 59 | Admitting: Family Medicine

## 2016-02-02 ENCOUNTER — Encounter: Payer: Self-pay | Admitting: Family Medicine

## 2016-02-02 ENCOUNTER — Emergency Department
Admission: EM | Admit: 2016-02-02 | Discharge: 2016-02-02 | Disposition: A | Discharge status: Home / Self Care | Payer: 59 | Attending: Emergency Medicine | Admitting: Emergency Medicine

## 2016-02-02 ENCOUNTER — Encounter: Payer: Self-pay | Admitting: *Deleted

## 2016-02-02 VITALS — BP 142/94 | HR 110 | Temp 98.3°F | Ht 66.0 in | Wt 167.8 lb

## 2016-02-02 DIAGNOSIS — J4531 Mild persistent asthma with (acute) exacerbation: Secondary | ICD-10-CM

## 2016-02-02 DIAGNOSIS — Z8669 Personal history of other diseases of the nervous system and sense organs: Secondary | ICD-10-CM | POA: Insufficient documentation

## 2016-02-02 DIAGNOSIS — Z87891 Personal history of nicotine dependence: Secondary | ICD-10-CM | POA: Diagnosis not present

## 2016-02-02 DIAGNOSIS — F329 Major depressive disorder, single episode, unspecified: Secondary | ICD-10-CM | POA: Insufficient documentation

## 2016-02-02 DIAGNOSIS — J209 Acute bronchitis, unspecified: Secondary | ICD-10-CM | POA: Diagnosis not present

## 2016-02-02 DIAGNOSIS — R0602 Shortness of breath: Secondary | ICD-10-CM | POA: Diagnosis present

## 2016-02-02 LAB — CBC WITH DIFFERENTIAL/PLATELET
Basophils Absolute: 0 10*3/uL (ref 0–0.1)
Basophils Relative: 0 %
Eosinophils Absolute: 0 10*3/uL (ref 0–0.7)
Eosinophils Relative: 0 %
HEMATOCRIT: 43.5 % (ref 35.0–47.0)
Hemoglobin: 14.3 g/dL (ref 12.0–16.0)
Lymphs Abs: 1.5 10*3/uL (ref 1.0–3.6)
MCH: 28.1 pg (ref 26.0–34.0)
MCHC: 32.8 g/dL (ref 32.0–36.0)
MCV: 85.8 fL (ref 80.0–100.0)
MONO ABS: 0.6 10*3/uL (ref 0.2–0.9)
NEUTROS ABS: 9.2 10*3/uL — AB (ref 1.4–6.5)
Neutrophils Relative %: 82 %
Platelets: 276 10*3/uL (ref 150–440)
RBC: 5.07 MIL/uL (ref 3.80–5.20)
RDW: 14.5 % (ref 11.5–14.5)
WBC: 11.3 10*3/uL — ABNORMAL HIGH (ref 3.6–11.0)

## 2016-02-02 LAB — BASIC METABOLIC PANEL
ANION GAP: 9 (ref 5–15)
BUN: 9 mg/dL (ref 6–20)
CALCIUM: 9.5 mg/dL (ref 8.9–10.3)
CO2: 25 mmol/L (ref 22–32)
Chloride: 106 mmol/L (ref 101–111)
Creatinine, Ser: 0.73 mg/dL (ref 0.44–1.00)
GFR calc Af Amer: >60 mL/min (ref >60)
GFR calc non Af Amer: >60 mL/min (ref >60)
GLUCOSE: 99 mg/dL (ref 65–99)
POTASSIUM: 4.1 mmol/L (ref 3.5–5.1)
Sodium: 140 mmol/L (ref 135–145)

## 2016-02-02 LAB — FIBRIN DERIVATIVES D-DIMER (ARMC ONLY): Fibrin derivatives D-dimer (ARMC): 386 (ref 0–499)

## 2016-02-02 LAB — APTT: aPTT: 33 seconds (ref 24–36)

## 2016-02-02 LAB — PREGNANCY, URINE: Preg Test, Ur: NEGATIVE

## 2016-02-02 MED ORDER — IPRATROPIUM-ALBUTEROL 0.5-2.5 (3) MG/3ML IN SOLN
RESPIRATORY_TRACT | Status: AC
  Administered 2016-02-02: 3 mL via RESPIRATORY_TRACT
  Filled 2016-02-02: qty 3

## 2016-02-02 MED ORDER — ALBUTEROL SULFATE (2.5 MG/3ML) 0.083% IN NEBU
INHALATION_SOLUTION | RESPIRATORY_TRACT | Status: AC
  Filled 2016-02-02: qty 6

## 2016-02-02 MED ORDER — HYDROCOD POLST-CPM POLST ER 10-8 MG/5ML PO SUER
5.0000 mL | Freq: Once | ORAL | Status: AC
  Administered 2016-02-02: 5 mL via ORAL
  Filled 2016-02-02: qty 5

## 2016-02-02 MED ORDER — IPRATROPIUM-ALBUTEROL 0.5-2.5 (3) MG/3ML IN SOLN
3.0000 mL | Freq: Once | RESPIRATORY_TRACT | Status: AC
  Administered 2016-02-02: 3 mL via RESPIRATORY_TRACT

## 2016-02-02 MED ORDER — HYDROCOD POLST-CPM POLST ER 10-8 MG/5ML PO SUER: 5.0000 mL | Freq: Four times a day (QID) | ORAL | Status: DC | PRN

## 2016-02-02 MED ORDER — ALBUTEROL SULFATE (2.5 MG/3ML) 0.083% IN NEBU
5.0000 mg | INHALATION_SOLUTION | Freq: Once | RESPIRATORY_TRACT | Status: AC
  Administered 2016-02-02: 5 mg via RESPIRATORY_TRACT

## 2016-02-02 NOTE — Progress Notes (Signed)
Patient ID: Heather [name], female   DOB: 09/24/1976, 40 y.o.   MRN: [id number]  Tommi Rumps, MD Phone: [phone]  Heather [name] is a 40 y.o. female who presents today for follow-up.  Patient presents for follow-up from office visit yesterday. Diagnosed with asthma exacerbation and treated with prednisone, doxycycline, and nebulizers. She continues to wheeze. Still coughing up thick mucus. Yesterday she noted minimal flecks of blood in her mucus that she is coughing up. today she notes streaks of blood in the mucus. Not every time. No globs of blood. She does note continued shortness of breath with speaking. No fever since Monday. She does note more discomfort with coughing in her chest and ribs. Notes it is not ongoing pain. It eventually subsides. No recent surgeries or long trips. No leg swelling. She's not on hormone therapy. No blood clot history. She has received 2 doses of prednisone and antibiotics. She does know she feels mildly better.  PMH: Former smoker   ROS see history of present illness  Objective  Physical Exam Filed Vitals:   02/02/16 1437 02/02/16 1512  BP: 142/94   Pulse: 104 110  Temp: 98.3 F (36.8 C)     BP Readings from Last 3 Encounters:  02/02/16 142/94  02/01/16 158/98  01/27/16 140/86   Wt Readings from Last 3 Encounters:  02/02/16 167 lb 12.8 oz (76.114 kg)  02/02/16 167 lb (75.751 kg)  02/01/16 169 lb 12.8 oz (77.021 kg)    Physical Exam  HENT:  Head: Normocephalic and atraumatic.  Right Ear: External ear normal.  Left Ear: External ear normal.  Mouth/Throat: Oropharynx is clear and moist. No oropharyngeal exudate.  Eyes: Conjunctivae are normal. Pupils are equal, round, and reactive to light.  Cardiovascular: Normal heart sounds.   Tachycardic  Pulmonary/Chest:  Mild increased work of breathing, poor short of breath when speaking in long sentences, scattered expiratory wheezes, breath sounds remain coarse though somewhat improved  from yesterday  Musculoskeletal: She exhibits no edema.  No calf swelling or tenderness  Neurological: [name]. Gait normal.  Skin: Skin is warm and dry. She is not diaphoretic.     Assessment/Plan: Please see individual problem list.  Asthma with acute exacerbation Patient presents in follow-up for asthma exacerbation from yesterday. Today she is still short of breath with talking and notes blood-streaked sputum now. Also notes some chest discomfort with coughing that eventually subsides. No history of blood clot. Concern with tachycardia and increased streaks of blood in sputum would be PE. She has no risk factors for PE or DVT. This all could be related to asthma exacerbation as well. We'll send her to the emergency room for evaluation and treatment of this and evaluation of her asthma. She will drive herself. CMA will contact charge nurse to let them know that she is on her way.    Tommi Rumps, MD [place]

## 2016-02-02 NOTE — Assessment & Plan Note (Addendum)
Patient presents in follow-up for asthma exacerbation from yesterday. Today she is still short of breath with talking and notes blood-streaked sputum now. Also notes some chest discomfort with coughing that eventually subsides. No history of blood clot. Concern with tachycardia and increased streaks of blood in sputum would be PE. She has no risk factors for PE or DVT. This all could be related to asthma exacerbation as well. We'll send her to the emergency room for evaluation and treatment of this and evaluation of her asthma. She will drive herself. CMA will contact charge nurse to let them know that she is on her way.

## 2016-02-02 NOTE — ED Notes (Addendum)
States she was seen by PCP yesterday and told she had acute asthma exacerbation, states she was told her BP was elevated and her HR, states chest tightness, states SOB, states she is currently on predisone and breathing txs q4 hrs

## 2016-02-02 NOTE — Discharge Instructions (Signed)
Acute Bronchitis Bronchitis is inflammation of the airways that extend from the windpipe into the lungs (bronchi). The inflammation often causes mucus to develop. This leads to a cough, which is the most common symptom of bronchitis.  In acute bronchitis, the condition usually develops suddenly and goes away over time, usually in a couple weeks. Smoking, allergies, and asthma can make bronchitis worse. Repeated episodes of bronchitis may cause further lung problems.  CAUSES Acute bronchitis is most often caused by the same virus that causes a cold. The virus can spread from person to person (contagious) through coughing, sneezing, and touching contaminated objects. SIGNS AND SYMPTOMS   Cough.   Fever.   Coughing up mucus.   Body aches.   Chest congestion.   Chills.   Shortness of breath.   Sore throat.  DIAGNOSIS  Acute bronchitis is usually diagnosed through a physical exam. Your health care provider will also ask you questions about your medical history. Tests, such as chest X-rays, are sometimes done to rule out other conditions.  TREATMENT  Acute bronchitis usually goes away in a couple weeks. Oftentimes, no medical treatment is necessary. Medicines are sometimes given for relief of fever or cough. Antibiotic medicines are usually not needed but may be prescribed in certain situations. In some cases, an inhaler may be recommended to help reduce shortness of breath and control the cough. A cool mist vaporizer may also be used to help thin bronchial secretions and make it easier to clear the chest.  HOME CARE INSTRUCTIONS  Get plenty of rest.   Drink enough fluids to keep your urine clear or pale yellow (unless you have a medical condition that requires fluid restriction). Increasing fluids may help thin your respiratory secretions (sputum) and reduce chest congestion, and it will prevent dehydration.   Take medicines only as directed by your health care provider.  If  you were prescribed an antibiotic medicine, finish it all even if you start to feel better.  Avoid smoking and secondhand smoke. Exposure to cigarette smoke or irritating chemicals will make bronchitis worse. If you are a smoker, consider using nicotine gum or skin patches to help control withdrawal symptoms. Quitting smoking will help your lungs heal faster.   Reduce the chances of another bout of acute bronchitis by washing your hands frequently, avoiding people with cold symptoms, and trying not to touch your hands to your mouth, nose, or eyes.   Keep all follow-up visits as directed by your health care provider.  SEEK MEDICAL CARE IF: Your symptoms do not improve after 1 week of treatment.  SEEK IMMEDIATE MEDICAL CARE IF:  You develop an increased fever or chills.   You have chest pain.   You have severe shortness of breath.  You have bloody sputum.   You develop dehydration.  You faint or repeatedly feel like you are going to pass out.  You develop repeated vomiting.  You develop a severe headache. MAKE SURE YOU:   Understand these instructions.  Will watch your condition.  Will get help right away if you are not doing well or get worse.   This information is not intended to replace advice given to you by your health care provider. Make sure you discuss any questions you have with your health care provider.   Document Released: 10/19/2004 Document Revised: 10/02/2014 Document Reviewed: 03/04/2013 Elsevier Interactive Patient Education 2016 Elsevier Inc[employer]  Asthma, Adult Asthma is a recurring condition in which the airways tighten and narrow. Asthma can make it  difficult to breathe. It can cause coughing, wheezing, and shortness of breath. Asthma episodes, also called asthma attacks, range from minor to life-threatening. Asthma cannot be cured, but medicines and lifestyle changes can help control it. CAUSES Asthma is believed to be caused by inherited (genetic) and  environmental factors, but its exact cause is unknown. Asthma may be triggered by allergens, lung infections, or irritants in the air. Asthma triggers are different for each person. Common triggers include:   Animal dander.  Dust mites.  Cockroaches.  Pollen from trees or grass.  Mold.  Smoke.  Air pollutants such as dust, household cleaners, hair sprays, aerosol sprays, paint fumes, strong chemicals, or strong odors.  Cold air, weather changes, and winds (which increase molds and pollens in the air).  Strong emotional expressions such as crying or laughing hard.  Stress.  Certain medicines (such as aspirin) or types of drugs (such as beta-blockers).  Sulfites in foods and drinks. Foods and drinks that may contain sulfites include dried fruit, potato chips, and sparkling grape juice.  Infections or inflammatory conditions such as the flu, a cold, or an inflammation of the nasal membranes (rhinitis).  Gastroesophageal reflux disease (GERD).  Exercise or strenuous activity. SYMPTOMS Symptoms may occur immediately after asthma is triggered or many hours later. Symptoms include:  Wheezing.  Excessive nighttime or early morning coughing.  Frequent or severe coughing with a common cold.  Chest tightness.  Shortness of breath. DIAGNOSIS  The diagnosis of asthma is made by a review of your medical history and a physical exam. Tests may also be performed. These may include:  Lung function studies. These tests show how much air you breathe in and out.  Allergy tests.  Imaging tests such as X-rays. TREATMENT  Asthma cannot be cured, but it can usually be controlled. Treatment involves identifying and avoiding your asthma triggers. It also involves medicines. There are 2 classes of medicine used for asthma treatment:   Controller medicines. These prevent asthma symptoms from occurring. They are usually taken every day.  Reliever or rescue medicines. These quickly relieve  asthma symptoms. They are used as needed and provide short-term relief. Your health care provider will help you create an asthma action plan. An asthma action plan is a written plan for managing and treating your asthma attacks. It includes a list of your asthma triggers and how they may be avoided. It also includes information on when medicines should be taken and when their dosage should be changed. An action plan may also involve the use of a device called a peak flow meter. A peak flow meter measures how well the lungs are working. It helps you monitor your condition. HOME CARE INSTRUCTIONS   Take medicines only as directed by your health care provider. Speak with your health care provider if you have questions about how or when to take the medicines.  Use a peak flow meter as directed by your health care provider. Record and keep track of readings.  Understand and use the action plan to help minimize or stop an asthma attack without needing to seek medical care.  Control your home environment in the following ways to help prevent asthma attacks:  Do not smoke. Avoid being exposed to secondhand smoke.  Change your heating and air conditioning filter regularly.  Limit your use of fireplaces and wood stoves.  Get rid of pests (such as roaches and mice) and their droppings.  Throw away plants if you see mold on them.  Clean  your floors and dust regularly. Use unscented cleaning products.  Try to have someone else vacuum for you regularly. Stay out of rooms while they are being vacuumed and for a short while afterward. If you vacuum, use a dust mask from a hardware store, a double-layered or microfilter vacuum cleaner bag, or a vacuum cleaner with a HEPA filter.  Replace carpet with wood, tile, or vinyl flooring. Carpet can trap dander and dust.  Use allergy-proof pillows, mattress covers, and box spring covers.  Wash bed sheets and blankets every week in hot water and dry them in a  dryer.  Use blankets that are made of polyester or cotton.  Clean bathrooms and kitchens with bleach. If possible, have someone repaint the walls in these rooms with mold-resistant paint. Keep out of the rooms that are being cleaned and painted.  Wash hands frequently. SEEK MEDICAL CARE IF:   You have wheezing, shortness of breath, or a cough even if taking medicine to prevent attacks.  The colored mucus you cough up (sputum) is thicker than usual.  Your sputum changes from clear or white to yellow, green, gray, or bloody.  You have any problems that may be related to the medicines you are taking (such as a rash, itching, swelling, or trouble breathing).  You are using a reliever medicine more than 2-3 times per week.  Your peak flow is still at 50-79% of your personal best after following your action plan for 1 hour.  You have a fever. SEEK IMMEDIATE MEDICAL CARE IF:   You seem to be getting worse and are unresponsive to treatment during an asthma attack.  You are short of breath even at rest.  You get short of breath when doing very little physical activity.  You have difficulty eating, drinking, or talking due to asthma symptoms.  You develop chest pain.  You develop a fast heartbeat.  You have a bluish color to your lips or fingernails.  You are light-headed, dizzy, or faint.  Your peak flow is less than 50% of your personal best.   This information is not intended to replace advice given to you by your health care provider. Make sure you discuss any questions you have with your health care provider.   Document Released: 09/11/2005 Document Revised: 06/02/2015 Document Reviewed: 04/10/2013 Elsevier Interactive Patient Education [employer].

## 2016-02-02 NOTE — Progress Notes (Signed)
Pre visit review using our clinic review tool, if applicable. No additional management support is needed unless otherwise documented below in the visit note. 

## 2016-02-02 NOTE — ED Provider Notes (Signed)
[facility] Emergency Department Provider Note  ____________________________________________  Time seen: 6:30 PM  I have reviewed the triage vital signs and the nursing notes.   HISTORY  Chief Complaint Shortness of Breath and Chest Pain    HPI Heather [name] is a 40 y.o. female sent to the ED today by primary care for evaluation of chest tightness and shortness of breath. The symptoms started 2 or 3 days ago. She went to her primary care yesterday, who started her on prednisone and doxycycline. She has a history of asthma and takes albuterol as well. She hasn't productive cough, no fevers or chills. Chest pain is worse with coughing and deep inspiration, intermittent. Not exertional. No aggravating or alleviating factors other than deep breathing. No dyspnea on exertion. No hemoptysis.  No recent travel trauma hospitalizations or surgeries. No history of DVT or PE.     Past Medical History  Diagnosis Date  . Depression     bipolar  . Migraines   . Asthma   . Seizures ([place])   . H/O cardiac arrhythmia   . Fainting spell     H/O     Patient Active Problem List   Diagnosis Date Noted  . Respiratory infection 12/23/2015  . Desire for pregnancy 10/03/2015  . Asthma with acute exacerbation 07/05/2015  . Neck pain on left side 08/20/2013  . Migraine 07/20/2013  . Seizure disorder ([place]) 07/20/2013  . Stress 07/20/2013  . Asthma 07/20/2013  . Constipation 07/20/2013     Past Surgical History  Procedure Laterality Date  . Tubal ligation Bilateral 09/2002     Current Outpatient Rx  Name  Route  Sig  Dispense  Refill  . albuterol (PROVENTIL HFA;VENTOLIN HFA) 108 (90 Base) MCG/ACT inhaler   Inhalation   Inhale 2 puffs into the lungs every 6 (six) hours as needed for wheezing or shortness of breath.   1 Inhaler   6   . albuterol (PROVENTIL) (2.5 MG/3ML) 0.083% nebulizer solution   Nebulization   Take 3 mLs (2.5 mg total) by nebulization  every 6 (six) hours as needed for wheezing or shortness of breath.   75 mL   2   . chlorpheniramine-HYDROcodone (TUSSIONEX PENNKINETIC ER) 10-8 MG/5ML SUER   Oral   Take 5 mLs by mouth every 6 (six) hours as needed for cough.   115 mL   0   . doxycycline (VIBRA-TABS) 100 MG tablet   Oral   Take 1 tablet (100 mg total) by mouth 2 (two) times daily.   14 tablet   0   . fluticasone (FLOVENT HFA) 110 MCG/ACT inhaler   Inhalation   Inhale 2 puffs into the lungs 2 (two) times daily.   1 Inhaler   1   . predniSONE (DELTASONE) 50 MG tablet   Oral   Take 1 tablet (50 mg total) by mouth daily with breakfast.   5 tablet   0   . traZODone (DESYREL) 50 MG tablet   Oral   Take 0.5-1 tablets (25-50 mg total) by mouth at bedtime as needed for sleep.   30 tablet   1      Allergies Augmentin; Erythromycin; Lithium; and Tramadol   Family History  Problem Relation Age of Onset  . Hypertension Mother   . Diabetes Mother   . Arthritis Mother   . Heart disease Mother     Social History Social History  Substance Use Topics  . Smoking status: Former Smoker -- 0.50 packs/day  Types: Cigarettes  . Smokeless tobacco: Never Used     Comment: Quit mid October 2014  . Alcohol Use: No    Review of Systems  Constitutional:   No fever or chills.  Eyes:   No vision changes.  ENT:   No sore throat. No rhinorrhea. Cardiovascular:   Positive as above chest pain. Respiratory:   Positive shortness of breath and productive cough. Gastrointestinal:   Negative for abdominal pain, vomiting and diarrhea.  Genitourinary:   Negative for dysuria or difficulty urinating. Musculoskeletal:   Negative for focal pain or swelling Neurological:   Negative for headaches 10-point ROS otherwise negative.  ____________________________________________   PHYSICAL EXAM:  VITAL SIGNS: ED Triage Vitals  Enc Vitals Group     BP --      Pulse Rate 02/02/16 1637 108     Resp 02/02/16 1637 18      Temp 02/02/16 1637 98.6 F (37 C)     Temp Source 02/02/16 1637 Oral     SpO2 02/02/16 1637 99 %     Weight 02/02/16 1637 167 lb (75.751 kg)     Height 02/02/16 1637 5\' 7"  (1.702 m)     Head Cir --      Peak Flow --      Pain Score 02/02/16 1640 3     Pain Loc --      Pain Edu? --      Excl. in [place]? --     Vital signs reviewed, nursing assessments reviewed.   Constitutional:   Alert and oriented. Well appearing and in no distress. Eyes:   No scleral icterus. No conjunctival pallor. PERRL. EOMI.  No nystagmus. ENT   Head:   Normocephalic and atraumatic.   Nose:   No congestion/rhinnorhea. No septal hematoma   Mouth/Throat:   MMM, no pharyngeal erythema. No peritonsillar mass.    Neck:   No stridor. No SubQ emphysema. No meningismus. Hematological/Lymphatic/Immunilogical:   No cervical lymphadenopathy. Cardiovascular:   RRR. Symmetric bilateral radial and DP pulses.  No murmurs.  Respiratory:   Normal respiratory effort without tachypnea nor retractions. Breath sounds are clear and equal bilaterally with normal breathing. No wheezes/rales/rhonchi. On rapid forceful expiration there is inducible wheezing. Gastrointestinal:   Soft and nontender. Non distended. There is no CVA tenderness.  No rebound, rigidity, or guarding. Genitourinary:   deferred Musculoskeletal:   Nontender with normal range of motion in all extremities. No joint effusions.  No lower extremity tenderness.  No edema. Neurologic:   Normal speech and language.  CN 2-10 normal. Motor grossly intact. No gross focal neurologic deficits are appreciated.  Skin:    Skin is warm, dry and intact. No rash noted.  No petechiae, purpura, or bullae.  ____________________________________________    LABS (pertinent positives/negatives) (all labs ordered are listed, but only abnormal results are displayed) Labs Reviewed  CBC WITH DIFFERENTIAL/PLATELET - Abnormal; Notable for the following:    WBC 11.3 (*)     Neutro Abs 9.2 (*)    All other components within normal limits  BASIC METABOLIC PANEL  FIBRIN DERIVATIVES D-DIMER ([facility] ONLY)  APTT  PREGNANCY, URINE   ____________________________________________   EKG  Interpreted by me  Date: 02/02/2016  Rate: 91  Rhythm: normal sinus rhythm  QRS Axis: normal  Intervals: normal  ST/T Wave abnormalities: normal  Conduction Disutrbances: none  Narrative Interpretation: unremarkable      ____________________________________________    RADIOLOGY  Chest x-ray unremarkable  ____________________________________________   PROCEDURES  ____________________________________________   INITIAL IMPRESSION / ASSESSMENT AND PLAN / ED COURSE  Pertinent labs & imaging results that were available during my care of the patient were reviewed by me and considered in my medical decision making (see chart for details).  Patient well appearing no acute distress. Has persistent shortness of breath with some mild pleuritic chest pain, likely asthma exacerbation and acute bronchitis. She is already on prednisone and doxycycline by her PCP which she's been taking probably 24 hours. Low risk for PE, low suspicion for occult pneumothorax or pneumonia, no evidence of sepsis at this time. I think the tachycardia is related to multiple doses of bronchodilators given here in the emergency department for her symptoms which have improved them. Workup is negative including d-dimer.  Patient well appearing no acute distress, we'll discharge home to follow up closely with primary care. Return precautions given regarding any worsening or uncontrollable symptoms.     ____________________________________________   FINAL CLINICAL IMPRESSION(S) / ED DIAGNOSES  Final diagnoses:  Asthma with acute exacerbation, mild persistent  Acute bronchitis, unspecified organism       Portions of this note were generated with dragon dictation software. Dictation errors may  occur despite best attempts at proofreading.   Carrie Mew, MD 02/02/16 [phone]

## 2016-02-02 NOTE — Patient Instructions (Signed)
Nice to see you. We have advised that you go to the ED for further evaluation and treatment of your asthma and for work up for blood clot.  If you develop chest pain, shortness of breath, or any new or change in symptoms in route to the ED please stop and call [phone].

## 2016-02-06 NOTE — Assessment & Plan Note (Signed)
Seeing gyn.  Postponing for now.

## 2016-02-06 NOTE — Assessment & Plan Note (Signed)
No headache currently.  Having problems sleeping.  Start trazodone.  See if can break cycle.  No planning pregnancy now.  Follow.  Schedule her to come back soon to reassess.

## 2016-02-06 NOTE — Assessment & Plan Note (Signed)
Breathing stable.

## 2016-02-06 NOTE — Assessment & Plan Note (Signed)
Handling stress.  Stress is better.  Follow.

## 2016-02-10 ENCOUNTER — Ambulatory Visit (INDEPENDENT_AMBULATORY_CARE_PROVIDER_SITE_OTHER): Payer: 59 | Admitting: Family Medicine

## 2016-02-10 ENCOUNTER — Other Ambulatory Visit (INDEPENDENT_AMBULATORY_CARE_PROVIDER_SITE_OTHER): Payer: 59

## 2016-02-10 ENCOUNTER — Other Ambulatory Visit: Payer: Self-pay | Admitting: Family Medicine

## 2016-02-10 ENCOUNTER — Encounter: Payer: Self-pay | Admitting: Family Medicine

## 2016-02-10 VITALS — BP 112/82 | HR 88 | Temp 98.2°F | Ht 67.0 in | Wt 166.2 lb

## 2016-02-10 DIAGNOSIS — Z658 Other specified problems related to psychosocial circumstances: Secondary | ICD-10-CM | POA: Diagnosis not present

## 2016-02-10 DIAGNOSIS — F439 Reaction to severe stress, unspecified: Secondary | ICD-10-CM

## 2016-02-10 DIAGNOSIS — J4531 Mild persistent asthma with (acute) exacerbation: Secondary | ICD-10-CM | POA: Diagnosis not present

## 2016-02-10 DIAGNOSIS — Z13220 Encounter for screening for lipoid disorders: Secondary | ICD-10-CM | POA: Diagnosis not present

## 2016-02-10 LAB — COMPREHENSIVE METABOLIC PANEL
ALBUMIN: 4.3 g/dL (ref 3.5–5.2)
ALT: 22 U/L (ref 0–35)
AST: 18 U/L (ref 0–37)
Alkaline Phosphatase: 101 U/L (ref 39–117)
BUN: 12 mg/dL (ref 6–23)
CALCIUM: 9.3 mg/dL (ref 8.4–10.5)
CHLORIDE: 105 meq/L (ref 96–112)
CO2: 25 meq/L (ref 19–32)
CREATININE: 0.69 mg/dL (ref 0.40–1.20)
GFR: 99.78 mL/min (ref >60.00)
Glucose, Bld: 83 mg/dL (ref 70–99)
Potassium: 4.2 mEq/L (ref 3.5–5.1)
Sodium: 138 mEq/L (ref 135–145)
Total Bilirubin: 0.6 mg/dL (ref 0.2–1.2)
Total Protein: 6.4 g/dL (ref 6.0–8.3)

## 2016-02-10 LAB — CBC WITH DIFFERENTIAL/PLATELET
BASOS ABS: 0 10*3/uL (ref 0.0–0.1)
BASOS PCT: 0.3 % (ref 0.0–3.0)
EOS ABS: 0.1 10*3/uL (ref 0.0–0.7)
Eosinophils Relative: 0.8 % (ref 0.0–5.0)
HEMATOCRIT: 43.2 % (ref 36.0–46.0)
HEMOGLOBIN: 14.5 g/dL (ref 12.0–15.0)
LYMPHS PCT: 16.9 % (ref 12.0–46.0)
Lymphs Abs: 2.4 10*3/uL (ref 0.7–4.0)
MCHC: 33.6 g/dL (ref 30.0–36.0)
MCV: 84.6 fl (ref 78.0–100.0)
Monocytes Absolute: 0.7 10*3/uL (ref 0.1–1.0)
Monocytes Relative: 4.9 % (ref 3.0–12.0)
Neutro Abs: 11 10*3/uL — ABNORMAL HIGH (ref 1.4–7.7)
Neutrophils Relative %: 77.1 % — ABNORMAL HIGH (ref 43.0–77.0)
Platelets: 298 10*3/uL (ref 150.0–400.0)
RBC: 5.11 Mil/uL (ref 3.87–5.11)
RDW: 14.3 % (ref 11.5–15.5)
WBC: 14.3 10*3/uL — AB (ref 4.0–10.5)

## 2016-02-10 LAB — LIPID PANEL
CHOL/HDL RATIO: 5
Cholesterol: 204 mg/dL — ABNORMAL HIGH (ref 0–200)
HDL: 37.9 mg/dL — AB (ref >39.00)
LDL CALC: 139 mg/dL — AB (ref 0–99)
NONHDL: 166.58
TRIGLYCERIDES: 137 mg/dL (ref 0.0–149.0)
VLDL: 27.4 mg/dL (ref 0.0–40.0)

## 2016-02-10 LAB — TSH: TSH: 0.41 u[IU]/mL (ref 0.35–4.50)

## 2016-02-10 MED ORDER — PREDNISONE 10 MG PO TABS: ORAL_TABLET | ORAL | Status: DC

## 2016-02-10 NOTE — Patient Instructions (Signed)
Nice to see you. You are significantly improvement from your last visit. We will extend a taper on prednisone as you're still wheezing. You should follow up with Dr. Nicki Reaper to discuss your asthma further sometime in the next month. If you develop chest pain, shortness of breath, cough productive of blood, fevers, or any new or changing symptoms please seek medical attention.

## 2016-02-10 NOTE — Progress Notes (Signed)
Pre visit review using our clinic review tool, if applicable. No additional management support is needed unless otherwise documented below in the visit note. 

## 2016-02-10 NOTE — Assessment & Plan Note (Signed)
Significantly improved though still wheezing some. Breathing much better. Vital signs are stable. Still requiring inhaler relatively frequently. We will extend her prednisone and taper off over the next 6 days. I advised that she follow-up with her PCP to discuss additional medications for her asthma given that she has had several asthma exacerbations throughout the fall winter and spring. She is given return precautions.

## 2016-02-10 NOTE — Progress Notes (Signed)
Patient ID: Heather [name], female   DOB: 01-Apr-1976, 40 y.o.   MRN: [id number]  Heather Rumps, MD Phone: [phone]  Heather [name] is a 40 y.o. female who presents today for follow-up.  Patient notes she is breathing significantly better following her asthma exacerbation. Was evaluated in the emergency room and had negative workup. Does note still coughing some and wheezing some. Minimal shortness of breath was much improved. Still coughing up some phlegm though no blood. Using nebulizer treatments 3-4 times a night. Taking Robitussin intermittently. Occasional Tussionex. No fevers. Mild sharp discomfort in her chest when she coughs and no other chest pain. She is only using Flovent for her asthma.  PMH: Former smoker   ROS see history of present illness  Objective  Physical Exam Filed Vitals:   02/10/16 1055  BP: 112/82  Pulse: 88  Temp: 98.2 F (36.8 C)    BP Readings from Last 3 Encounters:  02/10/16 112/82  02/02/16 138/102  02/02/16 142/94   Wt Readings from Last 3 Encounters:  02/10/16 166 lb 3.2 oz (75.388 kg)  02/02/16 167 lb (75.751 kg)  02/02/16 167 lb 12.8 oz (76.114 kg)    Physical Exam  Constitutional: She is well-developed, well-nourished, and in no distress.  HENT:  Head: Normocephalic and atraumatic.  Right Ear: External ear normal.  Left Ear: External ear normal.  Mouth/Throat: Oropharynx is clear and moist. No oropharyngeal exudate.  Eyes: Conjunctivae are normal. Pupils are equal, round, and reactive to light.  Cardiovascular: Normal rate, regular rhythm and normal heart sounds.   Pulmonary/Chest: Effort normal. No respiratory distress. She has wheezes (scattered expiratory wheezes). She has no rales.  Neurological: [name]. Gait normal.  Skin: Skin is warm and dry. She is not diaphoretic.     Assessment/Plan: Please see individual problem list.  Asthma with acute exacerbation Significantly improved though still wheezing some.  Breathing much better. Vital signs are stable. Still requiring inhaler relatively frequently. We will extend her prednisone and taper off over the next 6 days. I advised that she follow-up with her PCP to discuss additional medications for her asthma given that she has had several asthma exacerbations throughout the fall winter and spring. She is given return precautions.    No orders of the defined types were placed in this encounter.    Meds ordered this encounter  Medications  . predniSONE (DELTASONE) 10 MG tablet    Sig: Please take 60 mg (6 tablets) by mouth today and then decrease by 1 tablet daily until gone    Dispense:  21 tablet    Refill:  0    Heather Rumps, MD [place]

## 2016-02-14 ENCOUNTER — Other Ambulatory Visit: Payer: Self-pay | Admitting: Internal Medicine

## 2016-02-14 DIAGNOSIS — D72829 Elevated white blood cell count, unspecified: Secondary | ICD-10-CM

## 2016-02-14 NOTE — Progress Notes (Signed)
Order placed for f/u cbc.   

## 2016-03-01 ENCOUNTER — Other Ambulatory Visit (INDEPENDENT_AMBULATORY_CARE_PROVIDER_SITE_OTHER): Payer: 59

## 2016-03-01 DIAGNOSIS — D72829 Elevated white blood cell count, unspecified: Secondary | ICD-10-CM

## 2016-03-01 LAB — CBC WITH DIFFERENTIAL/PLATELET
Basophils Absolute: 0.1 10*3/uL (ref 0.0–0.1)
Basophils Relative: 0.7 % (ref 0.0–3.0)
Eosinophils Absolute: 0.1 10*3/uL (ref 0.0–0.7)
Eosinophils Relative: 1.5 % (ref 0.0–5.0)
HEMATOCRIT: 40.7 % (ref 36.0–46.0)
Hemoglobin: 13.7 g/dL (ref 12.0–15.0)
LYMPHS ABS: 2 10*3/uL (ref 0.7–4.0)
LYMPHS PCT: 22.3 % (ref 12.0–46.0)
MCHC: 33.7 g/dL (ref 30.0–36.0)
MCV: 84.7 fl (ref 78.0–100.0)
MONOS PCT: 5 % (ref 3.0–12.0)
Monocytes Absolute: 0.5 10*3/uL (ref 0.1–1.0)
NEUTROS PCT: 70.5 % (ref 43.0–77.0)
Neutro Abs: 6.4 10*3/uL (ref 1.4–7.7)
Platelets: 281 10*3/uL (ref 150.0–400.0)
RBC: 4.81 Mil/uL (ref 3.87–5.11)
RDW: 14 % (ref 11.5–15.5)
WBC: 9 10*3/uL (ref 4.0–10.5)

## 2016-03-10 NOTE — Telephone Encounter (Signed)
error 

## 2016-03-13 ENCOUNTER — Ambulatory Visit (INDEPENDENT_AMBULATORY_CARE_PROVIDER_SITE_OTHER): Payer: 59 | Admitting: Family Medicine

## 2016-03-13 ENCOUNTER — Encounter: Payer: Self-pay | Admitting: Family Medicine

## 2016-03-13 VITALS — BP 122/76 | HR 87 | Temp 98.4°F | Ht 66.0 in | Wt 166.0 lb

## 2016-03-13 DIAGNOSIS — J4520 Mild intermittent asthma, uncomplicated: Secondary | ICD-10-CM

## 2016-03-13 NOTE — Patient Instructions (Signed)
Nice to see you. Please continue to monitor your asthma. I'm glad you're feeling better. Please use your Flovent daily. If needed you can use your albuterol. 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 If you have recurrent symptoms we may need to consider adding an additional inhaler to help control your symptoms. If you develop shortness of breath, cough, wheezing, or any new or changing symptoms please seek medical attention.

## 2016-03-13 NOTE — Assessment & Plan Note (Signed)
Much improved from her last visit. 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 Benign lung exam today. Asymptomatic over the last several weeks. She'll continue her Flovent. If she has recurrent exacerbations would consider additional preventative inhaler. She is returning to work and is going to see how she does. 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 Given return precautions.

## 2016-03-13 NOTE — Progress Notes (Signed)
Patient ID: Heather [name], female   DOB: [birth year], 40 y.o.   MRN: [id number]  Tommi Rumps, MD Phone: [phone]  Heather [name] is a 40 y.o. female who presents today for follow-up.  Asthma: The patient notes she is doing significantly better since her last visit. Breathing is good. No shortness of breath. No wheezing. No cough. No nighttime symptoms. She's not used her nebulizer in the past 3-4 weeks. She is using her Flovent daily. 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 There are a number of children that had RSV.  PMH: Former smoker   ROS see history of present illness  Objective  Physical Exam Filed Vitals:   03/13/16 1430  BP: 122/76  Pulse: 87  Temp: 98.4 F (36.9 C)    BP Readings from Last 3 Encounters:  03/13/16 122/76  02/10/16 112/82  02/02/16 138/102   Wt Readings from Last 3 Encounters:  03/13/16 166 lb (75.297 kg)  02/10/16 166 lb 3.2 oz (75.388 kg)  02/02/16 167 lb (75.751 kg)    Physical Exam  Constitutional: She is well-developed, well-nourished, and in no distress.  HENT:  Head: Normocephalic and atraumatic.  Right Ear: External ear normal.  Left Ear: External ear normal.  Cardiovascular: Normal rate, regular rhythm and normal heart sounds.   Pulmonary/Chest: Effort normal and breath sounds normal.  Neurological: [name]. Gait normal.  Skin: Skin is warm and dry. She is not diaphoretic.     Assessment/Plan: Please see individual problem list.  Asthma Much improved from her last visit. 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 Benign lung exam today. Asymptomatic over the last several weeks. She'll continue her Flovent. If she has recurrent exacerbations would consider additional preventative inhaler. She is returning to work and is going to see how she does. If recurrence she is considering not working at a daycare  anymore. Given return precautions.    Tommi Rumps, MD [place]

## 2016-03-13 NOTE — Progress Notes (Signed)
Pre visit review using our clinic review tool, if applicable. No additional management support is needed unless otherwise documented below in the visit note. 

## 2016-04-28 ENCOUNTER — Ambulatory Visit: Payer: 59 | Admitting: Internal Medicine

## 2016-07-06 ENCOUNTER — Encounter: Payer: Self-pay | Admitting: Internal Medicine

## 2016-07-06 ENCOUNTER — Ambulatory Visit (INDEPENDENT_AMBULATORY_CARE_PROVIDER_SITE_OTHER): Payer: 59 | Admitting: Internal Medicine

## 2016-07-06 DIAGNOSIS — Z319 Encounter for procreative management, unspecified: Secondary | ICD-10-CM

## 2016-07-06 DIAGNOSIS — E7800 Pure hypercholesterolemia, unspecified: Secondary | ICD-10-CM

## 2016-07-06 DIAGNOSIS — F439 Reaction to severe stress, unspecified: Secondary | ICD-10-CM

## 2016-07-06 DIAGNOSIS — J4520 Mild intermittent asthma, uncomplicated: Secondary | ICD-10-CM

## 2016-07-06 MED ORDER — FLUTICASONE PROPIONATE HFA 110 MCG/ACT IN AERO: 2.0000 | INHALATION_SPRAY | Freq: Two times a day (BID) | RESPIRATORY_TRACT | 2 refills | Status: DC

## 2016-07-06 MED ORDER — ALBUTEROL SULFATE HFA 108 (90 BASE) MCG/ACT IN AERS: 2.0000 | INHALATION_SPRAY | Freq: Four times a day (QID) | RESPIRATORY_TRACT | 3 refills | Status: DC | PRN

## 2016-07-06 NOTE — Progress Notes (Signed)
Pre visit review using our clinic review tool, if applicable. No additional management support is needed unless otherwise documented below in the visit note. 

## 2016-07-06 NOTE — Patient Instructions (Signed)
flonase - 2 sprays each nostril one time per day.  Do this in the evening.

## 2016-07-06 NOTE — Progress Notes (Signed)
Patient ID: Heather [name], female   DOB: 01/14/1976, 40 y.o.   MRN: [id number]   Subjective:    Patient ID: Heather [name], female    DOB: 03/14/1976, 40 y.o.   MRN: [id number]  HPI  Patient here for a scheduled follow up.  She reports she is doing well.  Feels better.  Breathing has been better.  Using flovent bid.  No chest pain.  Eating and drinking ok. No nausea or vomiting.  Bowels stable.  Stress is better.     Past Medical History:  Diagnosis Date  . Asthma   . Depression    bipolar  . Fainting spell    H/O  . H/O cardiac arrhythmia   . Migraines   . Seizures ([place])    Past Surgical History:  Procedure Laterality Date  . TUBAL LIGATION Bilateral 09/2002   Family History  Problem Relation Age of Onset  . Hypertension Mother   . Diabetes Mother   . Arthritis Mother   . Heart disease Mother    Social History   Social History  . Marital status: Legally Separated    Spouse name: N/A  . Number of children: N/A  . Years of education: N/A   Social History Main Topics  . Smoking status: Former Smoker    Packs/day: 0.50    Types: Cigarettes  . Smokeless tobacco: Never Used     Comment: Quit mid October 2014  . Alcohol use No  . Drug use: No  . Sexual activity: Not Asked   Other Topics Concern  . None   Social History Narrative  . None    Outpatient Encounter Prescriptions as of 07/06/2016  Medication Sig  . albuterol (PROVENTIL HFA;VENTOLIN HFA) 108 (90 Base) MCG/ACT inhaler Inhale 2 puffs into the lungs every 6 (six) hours as needed for wheezing or shortness of breath.  Marland Kitchen albuterol (PROVENTIL) (2.5 MG/3ML) 0.083% nebulizer solution Take 3 mLs (2.5 mg total) by nebulization every 6 (six) hours as needed for wheezing or shortness of breath.  . fluticasone (FLOVENT HFA) 110 MCG/ACT inhaler Inhale 2 puffs into the lungs 2 (two) times daily.  . traZODone (DESYREL) 50 MG tablet Take 0.5-1 tablets (25-50 mg total) by mouth at bedtime as needed for sleep.  .  [DISCONTINUED] albuterol (PROVENTIL HFA;VENTOLIN HFA) 108 (90 Base) MCG/ACT inhaler Inhale 2 puffs into the lungs every 6 (six) hours as needed for wheezing or shortness of breath.  . [DISCONTINUED] fluticasone (FLOVENT HFA) 110 MCG/ACT inhaler Inhale 2 puffs into the lungs 2 (two) times daily.   No facility-administered encounter medications on file as of 07/06/2016.     Review of Systems  Constitutional: Negative for appetite change and unexpected weight change.  HENT: Negative for congestion and sinus pressure.   Respiratory: Negative for cough, chest tightness and shortness of breath.   Cardiovascular: Negative for chest pain, palpitations and leg swelling.  Gastrointestinal: Negative for abdominal pain, diarrhea, nausea and vomiting.  Genitourinary: Negative for difficulty urinating and dysuria.  Musculoskeletal: Negative for back pain and joint swelling.  Skin: Negative for color change and rash.  Neurological: Negative for dizziness, light-headedness and headaches.  Psychiatric/Behavioral: Negative for agitation and dysphoric mood.       Objective:    Physical Exam  Constitutional: She appears well-developed and well-nourished. No distress.  HENT:  Nose: Nose normal.  Mouth/Throat: Oropharynx is clear and moist.  Neck: Neck supple. No thyromegaly present.  Cardiovascular: Normal rate and regular rhythm.   Pulmonary/Chest: Breath  sounds normal. No respiratory distress. She has no wheezes.  Abdominal: Soft. Bowel sounds are normal. There is no tenderness.  Musculoskeletal: She exhibits no edema or tenderness.  Lymphadenopathy:    She has no cervical adenopathy.  Skin: No rash noted. No erythema.  Psychiatric: She has a normal mood and affect. Her behavior is normal.    BP 128/80   Pulse 85   Temp 98.5 F (36.9 C) (Oral)   Ht 5\' 6"  (1.676 m)   Wt 165 lb (74.8 kg)   LMP 06/15/2016   SpO2 98%   BMI 26.63 kg/m  Wt Readings from Last 3 Encounters:  07/06/16 165 lb  (74.8 kg)  03/13/16 166 lb (75.3 kg)  02/10/16 166 lb 3.2 oz (75.4 kg)     Lab Results  Component Value Date   WBC 9.0 03/01/2016   HGB 13.7 03/01/2016   HCT 40.7 03/01/2016   PLT 281.0 03/01/2016   GLUCOSE 83 02/10/2016   CHOL 204 (H) 02/10/2016   TRIG 137.0 02/10/2016   HDL 37.90 (L) 02/10/2016   LDLCALC 139 (H) 02/10/2016   ALT 22 02/10/2016   AST 18 02/10/2016   NA 138 02/10/2016   K 4.2 02/10/2016   CL 105 02/10/2016   CREATININE 0.69 02/10/2016   BUN 12 02/10/2016   CO2 25 02/10/2016   TSH 0.41 02/10/2016    Dg Chest 2 View  Result Date: 02/02/2016 CLINICAL DATA:  Shortness of breath and cough for 3 days, history of asthma EXAM: CHEST  2 VIEW COMPARISON:  02/01/2016 FINDINGS: Cardiomediastinal silhouette is stable. No acute infiltrate or pleural effusion. No pulmonary edema. Bony thorax is unremarkable. IMPRESSION: No active cardiopulmonary disease. Electronically Signed   By: Lahoma Crocker M.D.   On: 02/02/2016 17:49       Assessment & Plan:   Problem List Items Addressed This Visit    Asthma    Continue flovent as directed.  Start using steroid nasal spray and mucinex if any congestion or symptoms.  Follow.  Breathing stable.        Relevant Medications   albuterol (PROVENTIL HFA;VENTOLIN HFA) 108 (90 Base) MCG/ACT inhaler   fluticasone (FLOVENT HFA) 110 MCG/ACT inhaler   Desire for pregnancy    Not actively trying now.  Follow.  See previous note.       Hypercholesterolemia    Low cholesterol diet and exercise.  Follow lipid panel.       Relevant Orders   Comprehensive metabolic panel   Lipid panel   Stress    Handling stress.  Stress is better.  Follow.         Other Visit Diagnoses   None.      Einar Pheasant, MD

## 2016-07-09 ENCOUNTER — Encounter: Payer: Self-pay | Admitting: Internal Medicine

## 2016-07-09 DIAGNOSIS — E7800 Pure hypercholesterolemia, unspecified: Secondary | ICD-10-CM | POA: Insufficient documentation

## 2016-07-09 NOTE — Assessment & Plan Note (Signed)
Low cholesterol diet and exercise.  Follow lipid panel.   

## 2016-07-09 NOTE — Assessment & Plan Note (Signed)
Handling stress.  Stress is better.  Follow.

## 2016-07-09 NOTE — Assessment & Plan Note (Signed)
Continue flovent as directed.  Start using steroid nasal spray and mucinex if any congestion or symptoms.  Follow.  Breathing stable.

## 2016-07-09 NOTE — Assessment & Plan Note (Signed)
Not actively trying now.  Follow.  See previous note.

## 2016-10-05 ENCOUNTER — Other Ambulatory Visit: Payer: 59

## 2016-10-10 ENCOUNTER — Ambulatory Visit (INDEPENDENT_AMBULATORY_CARE_PROVIDER_SITE_OTHER): Payer: 59 | Admitting: Internal Medicine

## 2016-10-10 ENCOUNTER — Encounter: Payer: Self-pay | Admitting: Internal Medicine

## 2016-10-10 VITALS — BP 120/84 | HR 80 | Temp 98.0°F | Ht 66.0 in | Wt 159.0 lb

## 2016-10-10 DIAGNOSIS — G40909 Epilepsy, unspecified, not intractable, without status epilepticus: Secondary | ICD-10-CM | POA: Diagnosis not present

## 2016-10-10 DIAGNOSIS — Z0000 Encounter for general adult medical examination without abnormal findings: Secondary | ICD-10-CM | POA: Diagnosis not present

## 2016-10-10 DIAGNOSIS — Z1239 Encounter for other screening for malignant neoplasm of breast: Secondary | ICD-10-CM

## 2016-10-10 DIAGNOSIS — Z8759 Personal history of other complications of pregnancy, childbirth and the puerperium: Secondary | ICD-10-CM

## 2016-10-10 DIAGNOSIS — E7800 Pure hypercholesterolemia, unspecified: Secondary | ICD-10-CM

## 2016-10-10 DIAGNOSIS — Z1231 Encounter for screening mammogram for malignant neoplasm of breast: Secondary | ICD-10-CM | POA: Diagnosis not present

## 2016-10-10 DIAGNOSIS — N926 Irregular menstruation, unspecified: Secondary | ICD-10-CM

## 2016-10-10 DIAGNOSIS — F439 Reaction to severe stress, unspecified: Secondary | ICD-10-CM | POA: Diagnosis not present

## 2016-10-10 DIAGNOSIS — J4520 Mild intermittent asthma, uncomplicated: Secondary | ICD-10-CM

## 2016-10-10 NOTE — Progress Notes (Signed)
Patient ID: CATORI GFELLER, female   DOB: 02/08/1976, 41 y.o.   MRN: [id number]   Subjective:    Patient ID: [name], female    DOB: 1976-08-20, 41 y.o.   MRN: [id number]  HPI  Patient here for a scheduled physical exam.  She reports she is concerned about possible recent miscarriage.  States she was seven days late.  Took home pregnancy test.  Was "faintly positive".  States she started having cramps.  Her lower back and lower abdomen hurt.  She then noticed some spotting.  Then started bleeding and passed some solid material.  No bleeding now.  Last spotting was yesterday.  No abdominal pain.  No cramping now.  Eating and drinking.  No nausea or vomiting.  Bowels moving.  No urine change.  Some increased stress related to the above.  Overall she feels she is handling things relatively well.  Discussed with her today.  Last pap 9-06/2015.  States pap and everything checked out ok.     Past Medical History:  Diagnosis Date  . Asthma   . Depression    bipolar  . Fainting spell    H/O  . H/O cardiac arrhythmia   . Migraines   . Seizures ([place])    Past Surgical History:  Procedure Laterality Date  . TUBAL LIGATION Bilateral 09/2002   Family History  Problem Relation Age of Onset  . Hypertension Mother   . Diabetes Mother   . Arthritis Mother   . Heart disease Mother    Social History   Social History  . Marital status: Legally Separated    Spouse name: N/A  . Number of children: N/A  . Years of education: N/A   Social History Main Topics  . Smoking status: Former Smoker    Packs/day: 0.50    Types: Cigarettes  . Smokeless tobacco: Never Used     Comment: Quit mid October 2014  . Alcohol use No  . Drug use: No  . Sexual activity: Not Asked   Other Topics Concern  . None   Social History Narrative  . None    Outpatient Encounter Prescriptions as of 10/10/2016  Medication Sig  . albuterol (PROVENTIL HFA;VENTOLIN HFA) 108 (90 Base) MCG/ACT inhaler Inhale 2  puffs into the lungs every 6 (six) hours as needed for wheezing or shortness of breath.  Marland Kitchen albuterol (PROVENTIL) (2.5 MG/3ML) 0.083% nebulizer solution Take 3 mLs (2.5 mg total) by nebulization every 6 (six) hours as needed for wheezing or shortness of breath.  . fluticasone (FLOVENT HFA) 110 MCG/ACT inhaler Inhale 2 puffs into the lungs 2 (two) times daily.  . [DISCONTINUED] traZODone (DESYREL) 50 MG tablet Take 0.5-1 tablets (25-50 mg total) by mouth at bedtime as needed for sleep.   No facility-administered encounter medications on file as of 10/10/2016.     Review of Systems  Constitutional: Negative for appetite change and unexpected weight change.  HENT: Negative for congestion and sinus pressure.   Respiratory: Negative for cough, chest tightness and shortness of breath.   Cardiovascular: Negative for chest pain, palpitations and leg swelling.  Gastrointestinal: Negative for abdominal pain, diarrhea, nausea and vomiting.  Genitourinary: Positive for vaginal bleeding. Negative for difficulty urinating and dysuria.  Musculoskeletal: Negative for back pain and joint swelling.  Skin: Negative for color change and rash.  Neurological: Negative for dizziness, light-headedness and headaches.  Psychiatric/Behavioral: Negative for agitation and dysphoric mood.       Objective:    Physical Exam  Constitutional: She is oriented to person, place, and time. She appears well-developed and well-nourished. No distress.  HENT:  Nose: Nose normal.  Mouth/Throat: Oropharynx is clear and moist.  Eyes: Right eye exhibits no discharge. Left eye exhibits no discharge. No scleral icterus.  Neck: Neck supple. No thyromegaly present.  Cardiovascular: Normal rate and regular rhythm.   Pulmonary/Chest: Breath sounds normal. No accessory muscle usage. No tachypnea. No respiratory distress. She has no decreased breath sounds. She has no wheezes. She has no rhonchi. Right breast exhibits no inverted nipple,  no mass, no nipple discharge and no tenderness (no axillary adenopathy). Left breast exhibits no inverted nipple, no mass, no nipple discharge and no tenderness (no axilarry adenopathy).  Abdominal: Soft. Bowel sounds are normal. There is no tenderness.  Genitourinary:  Genitourinary Comments: Normal external genitalia.  Vaginal vault without lesions.  No blood present.  Cervix identified.  Could not appreciate any adnexal masses or tenderness.    Musculoskeletal: She exhibits no edema or tenderness.  Lymphadenopathy:    She has no cervical adenopathy.  Neurological: [name] and oriented to person, place, and time.  Skin: Skin is warm. No rash noted. No erythema.  Psychiatric: She has a normal mood and affect. Her behavior is normal.    BP 120/84   Pulse 80   Temp 98 F (36.7 C) (Oral)   Ht 5\' 6"  (1.676 m)   Wt 159 lb (72.1 kg)   LMP 08/30/2016   SpO2 99%   BMI 25.66 kg/m  Wt Readings from Last 3 Encounters:  10/10/16 159 lb (72.1 kg)  07/06/16 165 lb (74.8 kg)  03/13/16 166 lb (75.3 kg)     Lab Results  Component Value Date   WBC 9.0 03/01/2016   HGB 13.7 03/01/2016   HCT 40.7 03/01/2016   PLT 281.0 03/01/2016   GLUCOSE 83 02/10/2016   CHOL 204 (H) 02/10/2016   TRIG 137.0 02/10/2016   HDL 37.90 (L) 02/10/2016   LDLCALC 139 (H) 02/10/2016   ALT 22 02/10/2016   AST 18 02/10/2016   NA 138 02/10/2016   K 4.2 02/10/2016   CL 105 02/10/2016   CREATININE 0.69 02/10/2016   BUN 12 02/10/2016   CO2 25 02/10/2016   TSH 0.41 02/10/2016    Dg Chest 2 View  Result Date: 02/02/2016 CLINICAL DATA:  Shortness of breath and cough for 3 days, history of asthma EXAM: CHEST  2 VIEW COMPARISON:  02/01/2016 FINDINGS: Cardiomediastinal silhouette is stable. No acute infiltrate or pleural effusion. No pulmonary edema. Bony thorax is unremarkable. IMPRESSION: No active cardiopulmonary disease. Electronically Signed   By: Lahoma Crocker M.D.   On: 02/02/2016 17:49       Assessment &  Plan:   Problem List Items Addressed This Visit    Asthma    Breathing stable.        Health care maintenance    Physical today 10/10/16.  Needs to schedule mammogram.        History of miscarriage    Question of recent miscarriage.  Currently asymptomatic.  Check serum pregnancy test.  Discussed referral to gyn.  She declines.  Follow.        Hypercholesterolemia    Low cholesterol diet and exercise.  Follow lipid panel.        Relevant Orders   Comprehensive metabolic panel   Lipid panel   Seizure disorder ([place])    No seizures.  On no medication.  Follow.  Stress    Increased stress as outlined.  Discussed with her today.  Does not feel needs anything more at this time.  Follow.         Other Visit Diagnoses    Irregular menses    -  Primary   Relevant Orders   B-HCG Quant (Completed)   CBC with Differential/Platelet   TSH   Breast cancer screening       Relevant Orders   MM DIGITAL SCREENING BILATERAL   Routine general medical examination at a health care facility           Einar Pheasant, MD

## 2016-10-10 NOTE — Progress Notes (Signed)
Pre visit review using our clinic review tool, if applicable. No additional management support is needed unless otherwise documented below in the visit note. 

## 2016-10-11 LAB — HCG, QUANTITATIVE, PREGNANCY

## 2016-10-12 ENCOUNTER — Encounter: Payer: Self-pay | Admitting: Internal Medicine

## 2016-10-12 DIAGNOSIS — Z0000 Encounter for general adult medical examination without abnormal findings: Secondary | ICD-10-CM | POA: Insufficient documentation

## 2016-10-12 DIAGNOSIS — Z8759 Personal history of other complications of pregnancy, childbirth and the puerperium: Secondary | ICD-10-CM | POA: Insufficient documentation

## 2016-10-12 NOTE — Assessment & Plan Note (Signed)
No seizures.  On no medication.  Follow.

## 2016-10-12 NOTE — Assessment & Plan Note (Signed)
Low cholesterol diet and exercise.  Follow lipid panel.   

## 2016-10-12 NOTE — Assessment & Plan Note (Signed)
Question of recent miscarriage.  Currently asymptomatic.  Check serum pregnancy test.  Discussed referral to gyn.  She declines.  Follow.

## 2016-10-12 NOTE — Assessment & Plan Note (Signed)
Increased stress as outlined.  Discussed with her today.  Does not feel needs anything more at this time.  Follow.   

## 2016-10-12 NOTE — Assessment & Plan Note (Signed)
Breathing stable.

## 2016-10-12 NOTE — Assessment & Plan Note (Signed)
Physical today 10/10/16.  Needs to schedule mammogram.

## 2016-10-13 ENCOUNTER — Telehealth: Payer: Self-pay | Admitting: *Deleted

## 2016-10-13 NOTE — Telephone Encounter (Signed)
Pt requested lab results Pt contact [phone]

## 2016-10-13 NOTE — Telephone Encounter (Signed)
Notified patient of results 

## 2016-10-19 ENCOUNTER — Other Ambulatory Visit (INDEPENDENT_AMBULATORY_CARE_PROVIDER_SITE_OTHER): Payer: 59

## 2016-10-19 DIAGNOSIS — N926 Irregular menstruation, unspecified: Secondary | ICD-10-CM

## 2016-10-19 DIAGNOSIS — E7800 Pure hypercholesterolemia, unspecified: Secondary | ICD-10-CM

## 2016-10-19 LAB — COMPREHENSIVE METABOLIC PANEL
ALBUMIN: 4.2 g/dL (ref 3.5–5.2)
ALK PHOS: 111 U/L (ref 39–117)
ALT: 10 U/L (ref 0–35)
AST: 10 U/L (ref 0–37)
BILIRUBIN TOTAL: 0.6 mg/dL (ref 0.2–1.2)
BUN: 6 mg/dL (ref 6–23)
CO2: 27 mEq/L (ref 19–32)
Calcium: 9.2 mg/dL (ref 8.4–10.5)
Chloride: 109 mEq/L (ref 96–112)
Creatinine, Ser: 0.66 mg/dL (ref 0.40–1.20)
GFR: 104.68 mL/min (ref >60.00)
GLUCOSE: 98 mg/dL (ref 70–99)
Potassium: 3.6 mEq/L (ref 3.5–5.1)
Sodium: 140 mEq/L (ref 135–145)
TOTAL PROTEIN: 6.4 g/dL (ref 6.0–8.3)

## 2016-10-19 LAB — LIPID PANEL
Cholesterol: 214 mg/dL — ABNORMAL HIGH (ref 0–200)
HDL: 36.7 mg/dL — AB (ref >39.00)
LDL Cholesterol: 148 mg/dL — ABNORMAL HIGH (ref 0–99)
NonHDL: 177.62
Total CHOL/HDL Ratio: 6
Triglycerides: 148 mg/dL (ref 0.0–149.0)
VLDL: 29.6 mg/dL (ref 0.0–40.0)

## 2016-10-19 LAB — CBC WITH DIFFERENTIAL/PLATELET
BASOS ABS: 0 10*3/uL (ref 0.0–0.1)
Basophils Relative: 0.4 % (ref 0.0–3.0)
Eosinophils Absolute: 0.1 10*3/uL (ref 0.0–0.7)
Eosinophils Relative: 0.7 % (ref 0.0–5.0)
HCT: 41.7 % (ref 36.0–46.0)
HEMOGLOBIN: 14.2 g/dL (ref 12.0–15.0)
LYMPHS ABS: 1.7 10*3/uL (ref 0.7–4.0)
LYMPHS PCT: 15.9 % (ref 12.0–46.0)
MCHC: 34.1 g/dL (ref 30.0–36.0)
MCV: 85.8 fl (ref 78.0–100.0)
MONOS PCT: 5.6 % (ref 3.0–12.0)
Monocytes Absolute: 0.6 10*3/uL (ref 0.1–1.0)
NEUTROS PCT: 77.4 % — AB (ref 43.0–77.0)
Neutro Abs: 8.2 10*3/uL — ABNORMAL HIGH (ref 1.4–7.7)
Platelets: 248 10*3/uL (ref 150.0–400.0)
RBC: 4.86 Mil/uL (ref 3.87–5.11)
RDW: 14 % (ref 11.5–15.5)
WBC: 10.6 10*3/uL — AB (ref 4.0–10.5)

## 2016-10-19 LAB — TSH: TSH: 1.19 u[IU]/mL (ref 0.35–4.50)

## 2016-10-20 ENCOUNTER — Encounter: Payer: Self-pay | Admitting: *Deleted

## 2017-01-29 ENCOUNTER — Ambulatory Visit: Payer: 59 | Admitting: Internal Medicine

## 2017-02-12 ENCOUNTER — Ambulatory Visit: Payer: 59 | Admitting: Internal Medicine

## 2017-02-14 ENCOUNTER — Ambulatory Visit: Payer: 59 | Admitting: Internal Medicine

## 2017-02-26 ENCOUNTER — Ambulatory Visit: Payer: 59 | Admitting: Internal Medicine

## 2017-02-26 DIAGNOSIS — Z0289 Encounter for other administrative examinations: Secondary | ICD-10-CM

## 2017-06-05 ENCOUNTER — Emergency Department
Admission: EM | Admit: 2017-06-05 | Discharge: 2017-06-05 | Disposition: A | Discharge status: Home / Self Care | Payer: BLUE CROSS/BLUE SHIELD | Attending: Emergency Medicine | Admitting: Emergency Medicine

## 2017-06-05 ENCOUNTER — Encounter: Payer: Self-pay | Admitting: *Deleted

## 2017-06-05 DIAGNOSIS — Y92007 Garden or yard of unspecified non-institutional (private) residence as the place of occurrence of the external cause: Secondary | ICD-10-CM | POA: Diagnosis not present

## 2017-06-05 DIAGNOSIS — Y998 Other external cause status: Secondary | ICD-10-CM | POA: Insufficient documentation

## 2017-06-05 DIAGNOSIS — Y93H2 Activity, gardening and landscaping: Secondary | ICD-10-CM | POA: Diagnosis not present

## 2017-06-05 DIAGNOSIS — Z87891 Personal history of nicotine dependence: Secondary | ICD-10-CM | POA: Diagnosis not present

## 2017-06-05 DIAGNOSIS — Z79899 Other long term (current) drug therapy: Secondary | ICD-10-CM | POA: Insufficient documentation

## 2017-06-05 DIAGNOSIS — L089 Local infection of the skin and subcutaneous tissue, unspecified: Secondary | ICD-10-CM | POA: Insufficient documentation

## 2017-06-05 DIAGNOSIS — S70361A Insect bite (nonvenomous), right thigh, initial encounter: Secondary | ICD-10-CM | POA: Insufficient documentation

## 2017-06-05 DIAGNOSIS — W57XXXA Bitten or stung by nonvenomous insect and other nonvenomous arthropods, initial encounter: Secondary | ICD-10-CM | POA: Insufficient documentation

## 2017-06-05 DIAGNOSIS — J45901 Unspecified asthma with (acute) exacerbation: Secondary | ICD-10-CM | POA: Insufficient documentation

## 2017-06-05 MED ORDER — SULFAMETHOXAZOLE-TRIMETHOPRIM 800-160 MG PO TABS: 1.0000 | ORAL_TABLET | Freq: Two times a day (BID) | ORAL | 0 refills | Status: DC

## 2017-06-05 MED ORDER — NAPROXEN 500 MG PO TABS
500.0000 mg | ORAL_TABLET | Freq: Once | ORAL | Status: AC
  Administered 2017-06-05: 500 mg via ORAL
  Filled 2017-06-05: qty 1

## 2017-06-05 MED ORDER — NAPROXEN 500 MG PO TABS
500.0000 mg | ORAL_TABLET | Freq: Two times a day (BID) | ORAL | Status: DC
Start: 2017-06-05 — End: 2017-10-24

## 2017-06-05 MED ORDER — SULFAMETHOXAZOLE-TRIMETHOPRIM 800-160 MG PO TABS
1.0000 | ORAL_TABLET | Freq: Once | ORAL | Status: AC
  Administered 2017-06-05: 1 via ORAL
  Filled 2017-06-05: qty 1

## 2017-06-05 NOTE — ED Triage Notes (Signed)
States a "spider bite" to her right upper thigh area, states tenderness

## 2017-06-05 NOTE — ED Provider Notes (Signed)
[facility] Emergency Department Provider Note   ____________________________________________   First MD Initiated Contact with Patient 06/05/17 [phone]     (approximate)  I have reviewed the triage vital signs and the nursing notes.   HISTORY  Chief Complaint Insect Bite    HPI Heather [name] is a 41 y.o. female patient complaining of  edema, and redness to the right medial thigh secondary to insect bite. 3 days ago.Patient states from a yard work when she felt the bite to her right thigh. Patient states the last 2 days increased redness and swelling. No palliative measures for complaint.   Past Medical History:  Diagnosis Date  . Asthma   . Depression    bipolar  . Fainting spell    H/O  . H/O cardiac arrhythmia   . Migraines   . Seizures [facility])     Patient Active Problem List   Diagnosis Date Noted  . Health care maintenance 10/12/2016  . History of miscarriage 10/12/2016  . Hypercholesterolemia 07/09/2016  . Respiratory infection 12/23/2015  . Desire for pregnancy 10/03/2015  . Asthma with acute exacerbation 07/05/2015  . Neck pain on left side 08/20/2013  . Migraine 07/20/2013  . Seizure disorder ([place]) 07/20/2013  . Stress 07/20/2013  . Asthma 07/20/2013  . Constipation 07/20/2013    Past Surgical History:  Procedure Laterality Date  . TUBAL LIGATION Bilateral 09/2002    Prior to Admission medications   Medication Sig Start Date End Date Taking? Authorizing Provider  albuterol (PROVENTIL HFA;VENTOLIN HFA) 108 (90 Base) MCG/ACT inhaler Inhale 2 puffs into the lungs every 6 (six) hours as needed for wheezing or shortness of breath. 07/06/16   Einar Pheasant, MD  albuterol (PROVENTIL) (2.5 MG/3ML) 0.083% nebulizer solution Take 3 mLs (2.5 mg total) by nebulization every 6 (six) hours as needed for wheezing or shortness of breath. 07/05/15   Thersa Salt G, DO  fluticasone (FLOVENT HFA) 110 MCG/ACT inhaler Inhale 2 puffs into the  lungs 2 (two) times daily. 07/06/16   Einar Pheasant, MD  naproxen (NAPROSYN) 500 MG tablet Take 1 tablet (500 mg total) by mouth 2 (two) times daily with a meal. 06/05/17   Sable Feil, PA-C  sulfamethoxazole-trimethoprim (BACTRIM DS,SEPTRA DS) 800-160 MG tablet Take 1 tablet by mouth 2 (two) times daily. 06/05/17   Sable Feil, PA-C    Allergies Augmentin [amoxicillin-pot clavulanate]; Erythromycin; Lithium; and Tramadol  Family History  Problem Relation Age of Onset  . Hypertension Mother   . Diabetes Mother   . Arthritis Mother   . Heart disease Mother     Social History Social History  Substance Use Topics  . Smoking status: Former Smoker    Packs/day: 0.50    Types: Cigarettes  . Smokeless tobacco: Never Used     Comment: Quit mid October 2014  . Alcohol use No    Review of Systems  Constitutional: No fever/chills Eyes: No visual changes. ENT: No sore throat. Cardiovascular: Denies chest pain. Respiratory: Denies shortness of breath. Gastrointestinal: No abdominal pain.  No nausea, no vomiting.  No diarrhea.  No constipation. Genitourinary: Negative for dysuria. Musculoskeletal: Negative for back pain. Skin: Negative for rash. Neurological: Negative for headaches, focal weakness or numbness. Psychiatric:Depression Endocrine:Hyperlipidemia Allergic/Immunilogical: See medication list ____________________________________________   PHYSICAL EXAM:  VITAL SIGNS: ED Triage Vitals  Enc Vitals Group     BP 06/05/17 0823 (!) 159/90     Pulse Rate 06/05/17 0823 80     Resp 06/05/17  0823 16     Temp 06/05/17 0823 98.2 F (36.8 C)     Temp Source 06/05/17 0823 Oral     SpO2 06/05/17 0823 100 %     Weight 06/05/17 0820 165 lb (74.8 kg)     Height 06/05/17 0820 5\' 6"  (1.676 m)     Head Circumference --      Peak Flow --      Pain Score 06/05/17 0819 0     Pain Loc --      Pain Edu? --      Excl. in [place]? --     Constitutional: Alert and oriented. Well  appearing and in no acute distress. Cardiovascular: Normal rate, regular rhythm. Grossly normal heart sounds.  Good peripheral circulation. Respiratory: Normal respiratory effort.  No retractions. Lungs CTAB. Musculoskeletal: No lower extremity tenderness nor edema.  No joint effusions. Neurologic:  Normal speech and language. No gross focal neurologic deficits are appreciated. No gait instability. Skin:  Skin is warm, dry and intact. No rash noted. Psychiatric: Mood and affect are normal. Speech and behavior are normal.  ____________________________________________   LABS (all labs ordered are listed, but only abnormal results are displayed)  Labs Reviewed - No data to display ____________________________________________  EKG   ____________________________________________  RADIOLOGY  No results found.  ____________________________________________   PROCEDURES  Procedure(s) performed: None  Procedures  Critical Care performed: No  ____________________________________________   INITIAL IMPRESSION / ASSESSMENT AND PLAN / ED COURSE  Pertinent labs & imaging results that were available during my care of the patient were reviewed by me and considered in my medical decision making (see chart for details).  Infected insect bite to the medial right thigh. Patient given discharge Instructions. Patient was take medication as directed. Patient has follow-up PCP as needed.      ____________________________________________   FINAL CLINICAL IMPRESSION(S) / ED DIAGNOSES  Final diagnoses:  Bug bite with infection, initial encounter      NEW MEDICATIONS STARTED DURING THIS VISIT:  New Prescriptions   NAPROXEN (NAPROSYN) 500 MG TABLET    Take 1 tablet (500 mg total) by mouth 2 (two) times daily with a meal.   SULFAMETHOXAZOLE-TRIMETHOPRIM (BACTRIM DS,SEPTRA DS) 800-160 MG TABLET    Take 1 tablet by mouth 2 (two) times daily.     Note:  This document was prepared  using Dragon voice recognition software and may include unintentional dictation errors.    Sable Feil, PA-C 06/05/17 [id number]    Lavonia Drafts, MD 06/05/17 [phone]

## 2017-06-09 ENCOUNTER — Emergency Department
Admission: EM | Admit: 2017-06-09 | Discharge: 2017-06-09 | Disposition: A | Discharge status: Home / Self Care | Payer: BLUE CROSS/BLUE SHIELD | Attending: Emergency Medicine | Admitting: Emergency Medicine

## 2017-06-09 ENCOUNTER — Encounter: Payer: Self-pay | Admitting: Emergency Medicine

## 2017-06-09 DIAGNOSIS — J45909 Unspecified asthma, uncomplicated: Secondary | ICD-10-CM | POA: Insufficient documentation

## 2017-06-09 DIAGNOSIS — R21 Rash and other nonspecific skin eruption: Secondary | ICD-10-CM | POA: Diagnosis present

## 2017-06-09 DIAGNOSIS — L249 Irritant contact dermatitis, unspecified cause: Secondary | ICD-10-CM | POA: Insufficient documentation

## 2017-06-09 DIAGNOSIS — Z79899 Other long term (current) drug therapy: Secondary | ICD-10-CM | POA: Insufficient documentation

## 2017-06-09 DIAGNOSIS — Z87891 Personal history of nicotine dependence: Secondary | ICD-10-CM | POA: Insufficient documentation

## 2017-06-09 MED ORDER — DEXAMETHASONE SODIUM PHOSPHATE 10 MG/ML IJ SOLN
10.0000 mg | Freq: Once | INTRAMUSCULAR | Status: AC
  Administered 2017-06-09: 10 mg via INTRAMUSCULAR
  Filled 2017-06-09: qty 1

## 2017-06-09 MED ORDER — PREDNISONE 10 MG PO TABS: 10.0000 mg | ORAL_TABLET | Freq: Every day | ORAL | 0 refills | Status: DC

## 2017-06-09 NOTE — ED Triage Notes (Signed)
States scattered itchy rash all over since working in yard one week ago. Had been doing yard work prior.

## 2017-06-09 NOTE — Discharge Instructions (Signed)
Please take prednisone as prescribed. Continue with oral Benadryl and topical calamine lotion. Make sure nails are trimmed. Return to the emergency department for any increasing pain, fevers, worsening symptoms or changes in health.

## 2017-06-09 NOTE — ED Provider Notes (Signed)
[facility] Provider Note   CSN: [id number] Arrival date & time: 06/09/17  1704     History   Chief Complaint Chief Complaint  Patient presents with  . Rash    HPI Heather [name] is a 41 y.o. female presents to the emergency department for evaluation ofrash. She was seenin the emergency department on 06/05/2017, had noticed insect bite 3 days prior to her right proximal inner thigh.she was placed on Bactrim and given naproxen. Redness and swelling along the proximal in her thigh has been improving but patient has developed a different rash on her legs and arms. Rashes extremity pruritic, she thinks she may have developed poison ivy. She is having mild improvement with calamine lotion and oral Benadryl. She denies any blistering of her skin. No rash throughout her trunk or back.  HPI  Past Medical History:  Diagnosis Date  . Asthma   . Depression    bipolar  . Fainting spell    H/O  . H/O cardiac arrhythmia   . Migraines   . Seizures [facility])     Patient Active Problem List   Diagnosis Date Noted  . Health care maintenance 10/12/2016  . History of miscarriage 10/12/2016  . Hypercholesterolemia 07/09/2016  . Respiratory infection 12/23/2015  . Desire for pregnancy 10/03/2015  . Asthma with acute exacerbation 07/05/2015  . Neck pain on left side 08/20/2013  . Migraine 07/20/2013  . Seizure disorder ([place]) 07/20/2013  . Stress 07/20/2013  . Asthma 07/20/2013  . Constipation 07/20/2013    Past Surgical History:  Procedure Laterality Date  . TUBAL LIGATION Bilateral 09/2002    OB History    No data available       Home Medications    Prior to Admission medications   Medication Sig Start Date End Date Taking? Authorizing Provider  albuterol (PROVENTIL HFA;VENTOLIN HFA) 108 (90 Base) MCG/ACT inhaler Inhale 2 puffs into the lungs every 6 (six) hours as needed for wheezing or shortness of breath. 07/06/16   Einar Pheasant, MD  albuterol (PROVENTIL)  (2.5 MG/3ML) 0.083% nebulizer solution Take 3 mLs (2.5 mg total) by nebulization every 6 (six) hours as needed for wheezing or shortness of breath. 07/05/15   Thersa Salt G, DO  fluticasone (FLOVENT HFA) 110 MCG/ACT inhaler Inhale 2 puffs into the lungs 2 (two) times daily. 07/06/16   Einar Pheasant, MD  naproxen (NAPROSYN) 500 MG tablet Take 1 tablet (500 mg total) by mouth 2 (two) times daily with a meal. 06/05/17   Sable Feil, PA-C  predniSONE (DELTASONE) 10 MG tablet Take 1 tablet (10 mg total) by mouth daily. 6,5,4,3,2,1 six day taper 06/09/17   Duanne Guess, PA-C  sulfamethoxazole-trimethoprim (BACTRIM DS,SEPTRA DS) 800-160 MG tablet Take 1 tablet by mouth 2 (two) times daily. 06/05/17   Sable Feil, PA-C    Family History Family History  Problem Relation Age of Onset  . Hypertension Mother   . Diabetes Mother   . Arthritis Mother   . Heart disease Mother     Social History Social History  Substance Use Topics  . Smoking status: Former Smoker    Packs/day: 0.50    Types: Cigarettes  . Smokeless tobacco: Never Used     Comment: Quit mid October 2014  . Alcohol use No     Allergies   Augmentin [amoxicillin-pot clavulanate]; Erythromycin; Lithium; and Tramadol   Review of Systems Review of Systems  Constitutional: Negative for fever.  Respiratory: Negative for shortness of breath.  Cardiovascular: Negative for chest pain.  Gastrointestinal: Negative for abdominal pain.  Genitourinary: Negative for difficulty urinating, dysuria and urgency.  Musculoskeletal: Negative for back pain and myalgias.  Skin: Positive for rash.  Neurological: Negative for dizziness and headaches.     Physical Exam Updated Vital Signs BP (!) 135/97   Pulse (!) 105   Temp 99 F (37.2 C) (Oral)   Resp 20   Ht 5\' 6"  (1.676 m)   Wt 74.8 kg (165 lb)   LMP 05/12/2017   SpO2 98%   BMI 26.63 kg/m   Physical Exam  Constitutional: She is oriented to person, place, and time. She  appears well-developed and well-nourished.  HENT:  Head: Normocephalic and atraumatic.  Right Ear: External ear normal.  Left Ear: External ear normal.  Nose: Nose normal.  Mouth/Throat: Oropharynx is clear and moist. No oropharyngeal exudate.  No signs of angioedema.  Eyes: Conjunctivae are normal.  Neck: Normal range of motion.  Cardiovascular: Regular rhythm.   Pulmonary/Chest: Effort normal. No respiratory distress.  Abdominal: Soft. She exhibits no distension.  Musculoskeletal: Normal range of motion. She exhibits no edema or deformity.  Lymphadenopathy:    She has no cervical adenopathy.  Neurological: [name] and oriented to person, place, and time. No cranial nerve deficit.  Skin:  Patient with a rash along her forearms, lower extremities. Mildly raised erythematous macule consistent with contact dermatitis. Original rash thought to be due to spider bite along the inner aspect of her right proximal thigh is resolving with no warmth, induration or fluctuance. Rash is nontender to palpation. There is no blistering     ED Treatments / Results  Labs (all labs ordered are listed, but only abnormal results are displayed) Labs Reviewed - No data to display  EKG  EKG Interpretation None       Radiology No results found.  Procedures Procedures (including critical care time)  Medications Ordered in ED Medications  dexamethasone (DECADRON) injection 10 mg (10 mg Intramuscular Given 06/09/17 1855)     Initial Impression / Assessment and Plan / ED Course  I have reviewed the triage vital signs and the nursing notes.  Pertinent labs & imaging results that were available during my care of the patient were reviewed by me and considered in my medical decision making (see chart for details).     41 year old female with skin rash consistent with contact dermatitis. Rashes not appear to be consistent with Stevens-Johnson syndrome or any type of drug reaction. Rash seems  to be more consistent with a contact dermatitis. Rash is extremely pruritic and patient seems to be spreading the rash with scratching. Patient will check her nails. She will be started on prednisone taper. She will continue with Benadryl and calamine lotion.she is educated on signs and symptoms return to ED for.  Final Clinical Impressions(s) / ED Diagnoses   Final diagnoses:  Irritant contact dermatitis, unspecified trigger    New Prescriptions New Prescriptions   PREDNISONE (DELTASONE) 10 MG TABLET    Take 1 tablet (10 mg total) by mouth daily. 6,5,4,3,2,1 six day taper     Renata Caprice 06/09/17 Terence Lux, MD 06/09/17 [phone]

## 2017-06-09 NOTE — ED Notes (Signed)
Pt discharged to home.  Family member driving.  Discharge instructions reviewed.  Verbalized understanding.  No questions or concerns at this time.  Teach back verified.  Pt in NAD.  No items left in ED.   

## 2017-06-11 ENCOUNTER — Ambulatory Visit (INDEPENDENT_AMBULATORY_CARE_PROVIDER_SITE_OTHER): Payer: BLUE CROSS/BLUE SHIELD | Admitting: Family Medicine

## 2017-06-11 ENCOUNTER — Encounter: Payer: Self-pay | Admitting: Family Medicine

## 2017-06-11 DIAGNOSIS — R21 Rash and other nonspecific skin eruption: Secondary | ICD-10-CM | POA: Diagnosis not present

## 2017-06-11 MED ORDER — PREDNISONE 10 MG PO TABS: ORAL_TABLET | ORAL | 0 refills | Status: DC

## 2017-06-11 MED ORDER — HYDROXYZINE HCL 25 MG PO TABS: 25.0000 mg | ORAL_TABLET | Freq: Four times a day (QID) | ORAL | 0 refills | Status: DC | PRN

## 2017-06-11 NOTE — Patient Instructions (Signed)
We will call regarding the dermatologist.  Medication as prescribed.  Take care  Dr. Lacinda Axon

## 2017-06-11 NOTE — Progress Notes (Signed)
Subjective:  Patient ID: Heather [name], female    DOB: January 19, 1976  Age: 41 y.o. MRN: [id number]  CC: Rash  HPI:  41 year old female presents with rash.  Patient reports that 2 weekends ago (weekend of 9/8) she was outside cleaning up in the yard and on the outskirts of the yard. She subsequently developed rash. She was seen in the ED on 9/11 and subsequently on 9/15. At her last visit on 9/15, this was thought to be irritant/contact dermatitis. She was treated with an antibiotic as well as a brief steroid taper. She states that she continues to have rash on her arms, legs, abdomen, and back. It is very pruritic. She has been taking the medication as prescribed and has had no improvement. She has now stopped the antibiotic. Rash is severe. She's had little relief with over-the-counter Benadryl. She has some areas that are draining.   Social Hx   Social History   Social History  . Marital status: Married    Spouse name: N/A  . Number of children: N/A  . Years of education: N/A   Social History Main Topics  . Smoking status: Former Smoker    Packs/day: 0.50    Types: Cigarettes  . Smokeless tobacco: Never Used     Comment: Quit mid October 2014  . Alcohol use No  . Drug use: No  . Sexual activity: Not Asked   Other Topics Concern  . None   Social History Narrative  . None    Review of Systems  Constitutional: Negative.   Skin: Positive for rash.   Objective:  BP 102/68 (BP Location: Left Arm, Patient Position: Sitting, Cuff Size: Normal)   Pulse 90   Temp 98.3 F (36.8 C) (Oral)   Resp 16   Wt 156 lb 2 oz (70.8 kg)   LMP 05/12/2017   SpO2 98%   BMI 25.20 kg/m   BP/Weight 06/11/2017 06/09/2017 1/49/7026  Systolic BP 378 588 502  Diastolic BP 68 90 90  Wt. (Lbs) 156.13 165 165  BMI 25.2 26.63 26.63    Physical Exam  Constitutional: She is oriented to person, place, and time. She appears well-developed. No distress.  Cardiovascular: Normal rate and regular  rhythm.   Pulmonary/Chest: Effort normal and breath sounds normal. She has no wheezes. She has no rales.  Neurological: [name] and oriented to person, place, and time.  Skin:  Arms, legs, and abdomen with raised areas of erythema. Vesicles present with some areas of rash. Excoriation noted.  Psychiatric: She has a normal mood and affect.  Vitals reviewed.   Lab Results  Component Value Date   WBC 10.6 (H) 10/19/2016   HGB 14.2 10/19/2016   HCT 41.7 10/19/2016   PLT 248.0 10/19/2016   GLUCOSE 98 10/19/2016   CHOL 214 (H) 10/19/2016   TRIG 148.0 10/19/2016   HDL 36.70 (L) 10/19/2016   LDLCALC 148 (H) 10/19/2016   ALT 10 10/19/2016   AST 10 10/19/2016   NA 140 10/19/2016   K 3.6 10/19/2016   CL 109 10/19/2016   CREATININE 0.66 10/19/2016   BUN 6 10/19/2016   CO2 27 10/19/2016   TSH 1.19 10/19/2016    Assessment & Plan:   Problem List Items Addressed This Visit    Rash    New problem. Patient has areas that look like poison ivy/poison oak, and some that appear like bug bites. I am changing her prednisone taper to a longer course and placing her on  Atarax. Referral to dermatology as well.      Relevant Orders   Ambulatory referral to Dermatology      Meds ordered this encounter  Medications  . predniSONE (DELTASONE) 10 MG tablet    Sig: 50 mg (5 tablets) daily x 3 days, then 40 mg (4 tablets) daily x 3 days, then 30 mg (3 tablets) daily x 3 days, then 20 mg (2 tablets) daily x 3 days, then 10 mg (1 tablet) daily x 3 days.    Dispense:  45 tablet    Refill:  0  . hydrOXYzine (ATARAX/VISTARIL) 25 MG tablet    Sig: Take 1 tablet (25 mg total) by mouth every 6 (six) hours as needed for itching.    Dispense:  30 tablet    Refill:  0   Follow-up: PRN  [place]

## 2017-06-11 NOTE — Assessment & Plan Note (Signed)
New problem. Patient has areas that look like poison ivy/poison oak, and some that appear like bug bites. I am changing her prednisone taper to a longer course and placing her on Atarax. Referral to dermatology as well.

## 2017-10-24 ENCOUNTER — Encounter: Payer: Self-pay | Admitting: Internal Medicine

## 2017-10-24 ENCOUNTER — Ambulatory Visit (INDEPENDENT_AMBULATORY_CARE_PROVIDER_SITE_OTHER): Payer: BLUE CROSS/BLUE SHIELD | Admitting: Internal Medicine

## 2017-10-24 ENCOUNTER — Ambulatory Visit: Payer: BLUE CROSS/BLUE SHIELD | Admitting: Family Medicine

## 2017-10-24 ENCOUNTER — Ambulatory Visit: Payer: Self-pay

## 2017-10-24 VITALS — BP 130/90 | HR 102 | Temp 97.9°F | Resp 18 | Ht 66.0 in | Wt 175.5 lb

## 2017-10-24 DIAGNOSIS — J4541 Moderate persistent asthma with (acute) exacerbation: Secondary | ICD-10-CM

## 2017-10-24 MED ORDER — BUDESONIDE-FORMOTEROL FUMARATE 160-4.5 MCG/ACT IN AERO: 2.0000 | INHALATION_SPRAY | Freq: Two times a day (BID) | RESPIRATORY_TRACT | 12 refills | Status: DC

## 2017-10-24 MED ORDER — IPRATROPIUM BROMIDE 0.02 % IN SOLN
0.5000 mg | Freq: Once | RESPIRATORY_TRACT | Status: AC
  Administered 2017-10-24: 0.5 mg via RESPIRATORY_TRACT

## 2017-10-24 MED ORDER — DOXYCYCLINE HYCLATE 100 MG PO TABS: 100.0000 mg | ORAL_TABLET | Freq: Two times a day (BID) | ORAL | 0 refills | Status: DC

## 2017-10-24 MED ORDER — PREDNISONE 20 MG PO TABS: ORAL_TABLET | ORAL | 0 refills | Status: DC

## 2017-10-24 MED ORDER — IPRATROPIUM-ALBUTEROL 0.5-2.5 (3) MG/3ML IN SOLN: 3.0000 mL | RESPIRATORY_TRACT | 2 refills | Status: DC | PRN

## 2017-10-24 MED ORDER — HYDROCODONE-HOMATROPINE 5-1.5 MG/5ML PO SYRP: 5.0000 mL | ORAL_SOLUTION | Freq: Every evening | ORAL | 0 refills | Status: DC | PRN

## 2017-10-24 MED ORDER — ALBUTEROL SULFATE (2.5 MG/3ML) 0.083% IN NEBU
2.5000 mg | INHALATION_SOLUTION | Freq: Once | RESPIRATORY_TRACT | Status: AC
  Administered 2017-10-24: 2.5 mg via RESPIRATORY_TRACT

## 2017-10-24 MED ORDER — ALBUTEROL SULFATE HFA 108 (90 BASE) MCG/ACT IN AERS: 1.0000 | INHALATION_SPRAY | RESPIRATORY_TRACT | 11 refills | Status: DC | PRN

## 2017-10-24 MED ORDER — MONTELUKAST SODIUM 10 MG PO TABS
10.0000 mg | ORAL_TABLET | Freq: Every day | ORAL | 3 refills | Status: DC
Start: 2017-10-24 — End: 2018-10-16

## 2017-10-24 NOTE — Telephone Encounter (Signed)
  Reason for Disposition . [1] MILD asthma attack (e.g., no SOB at rest, mild SOB with walking, speaks normally in sentences, mild wheezing) AND [2]  persists > 24 hours on appropriate treatment  Answer Assessment - Initial Assessment Questions 1. RESPIRATORY STATUS: "Describe your breathing?" (e.g., wheezing, shortness of breath, unable to speak, severe coughing)      Wheezing, short of breath with talking 2. ONSET: "When did this asthma attack begin?"      Started last Thursday 3. TRIGGER: "What do you think triggered this attack?" (e.g., URI, exposure to pollen or other allergen, tobacco smoke)      URI 4. PEAK EXPIRATORY FLOW RATE (PEFR): "Do you use a peak flow meter?" If so, ask: "What's the current peak flow? What's your personal best peak flow?"      No 5. SEVERITY: "How bad is this attack?"    - MILD: No SOB at rest, mild SOB with walking, speaks normally in sentences, can lay down, no retractions, pulse < 100. (GREEN Zone: PEFR 80-100%)   - MODERATE: SOB at rest, SOB with minimal exertion and prefers to sit, cannot lie down flat, speaks in phrases, mild retractions, audible wheezing, pulse 100-120. (YELLOW Zone: PEFR 50-80%)    - SEVERE: Very SOB at rest, speaks in single words, struggling to breathe, sitting hunched forward, retractions, usually loud wheezing, sometimes minimal wheezing because of decreased air movement, pulse > 120. (RED Zone: PEFR < 50%).      Moderate 6. MEDICATIONS (Inhaler or nebs): "What are your asthma medications?" and "What treatments have you given so far?"    - Quick-relief: albuterol, metaproterenol, salbutamol, or other inhaled or nebulized beta-agonist medicines   - Long-term-control: steroids, cromolyn, or other anti-inflammatory medicines.     Albuterol 7. OTHER SYMPTOMS: "Do you have any other symptoms? (e.g., runny nose, chest pain, fever)     Chest tightness, back pain 8. PREGNANCY: "Is there any chance you are pregnant?" "When was your last  menstrual period?"     No  Protocols used: ASTHMA ATTACK-A-AH

## 2017-10-24 NOTE — Addendum Note (Signed)
Addended by: Leeanne Rio on: 10/24/2017 09:51 AM   Modules accepted: Orders

## 2017-10-24 NOTE — Patient Instructions (Signed)
Please go to ED if you are worsening  Continue Mucinex  Try Ibuprofen for chest and back pain due to coughing  F/u in 1 week  Consider pneumonia 23 vaccine   Pneumococcal Polysaccharide Vaccine: What You Need to Know 1. Why get vaccinated? Vaccination can protect older adults (and some children and younger adults) from pneumococcal disease. Pneumococcal disease is caused by bacteria that can spread from person to person through close contact. It can cause ear infections, and it can also lead to more serious infections of the:  Lungs (pneumonia),  Blood (bacteremia), and  Covering of the brain and spinal cord (meningitis). Meningitis can cause deafness and brain damage, and it can be fatal.  Anyone can get pneumococcal disease, but children under 73 years of age, people with certain medical conditions, adults over 43 years of age, and cigarette smokers are at the highest risk. About 18,000 older adults die each year from pneumococcal disease in the [place]. Treatment of pneumococcal infections with penicillin and other drugs used to be more effective. But some strains of the disease have become resistant to these drugs. This makes prevention of the disease, through vaccination, even more important. 2. Pneumococcal polysaccharide vaccine (PPSV23) Pneumococcal polysaccharide vaccine (PPSV23) protects against 23 types of pneumococcal bacteria. It will not prevent all pneumococcal disease. PPSV23 is recommended for:  All adults 43 years of age and older,  Anyone 2 through 43 years of age with certain long-term health problems,  Anyone 2 through 43 years of age with a weakened immune system,  Adults 2 through 43 years of age who smoke cigarettes or have asthma.  Most people need only one dose of PPSV. A second dose is recommended for certain high-risk groups. People 43 and older should get a dose even if they have gotten one or more doses of the vaccine before they turned 65. Your  healthcare provider can give you more information about these recommendations. Most healthy adults develop protection within 2 to 3 weeks of getting the shot. 3. Some people should not get this vaccine  Anyone who has had a life-threatening allergic reaction to PPSV should not get another dose.  Anyone who has a severe allergy to any component of PPSV should not receive it. Tell your provider if you have any severe allergies.  Anyone who is moderately or severely ill when the shot is scheduled may be asked to wait until they recover before getting the vaccine. Someone with a mild illness can usually be vaccinated.  Children less than 55 years of age should not receive this vaccine.  There is no evidence that PPSV is harmful to either a pregnant woman or to her fetus. However, as a precaution, women who need the vaccine should be vaccinated before becoming pregnant, if possible. 4. Risks of a vaccine reaction With any medicine, including vaccines, there is a chance of side effects. These are usually mild and go away on their own, but serious reactions are also possible. About half of people who get PPSV have mild side effects, such as redness or pain where the shot is given, which go away within about two days. Less than 1 out of 100 people develop a fever, muscle aches, or more severe local reactions. Problems that could happen after any vaccine:  People sometimes faint after a medical procedure, including vaccination. Sitting or lying down for about 15 minutes can help prevent fainting, and injuries caused by a fall. Tell your doctor if you feel dizzy, or  have vision changes or ringing in the ears.  Some people get severe pain in the shoulder and have difficulty moving the arm where a shot was given. This happens very rarely.  Any medication can cause a severe allergic reaction. Such reactions from a vaccine are very rare, estimated at about 1 in a million doses, and would happen within a few  minutes to a few hours after the vaccination. As with any medicine, there is a very remote chance of a vaccine causing a serious injury or death. The safety of vaccines is always being monitored. For more information, visit: http://www.aguilar.org/ 5. What if there is a serious reaction? What should I look for? Look for anything that concerns you, such as signs of a severe allergic reaction, very high fever, or unusual behavior. Signs of a severe allergic reaction can include hives, swelling of the face and throat, difficulty breathing, a fast heartbeat, dizziness, and weakness. These would usually start a few minutes to a few hours after the vaccination. What should I do? If you think it is a severe allergic reaction or other emergency that can't wait, call 9-1-1 or get to the nearest hospital. Otherwise, call your doctor. Afterward, the reaction should be reported to the Vaccine Adverse Event Reporting System (VAERS). Your doctor might file this report, or you can do it yourself through the VAERS web site at www.vaers.SamedayNews.es, or by calling (941)172-2503. VAERS does not give medical advice. 6. How can I learn more?  Ask your doctor. He or she can give you the vaccine package insert or suggest other sources of information.  Call your local or state health department.  Contact the Centers for Disease Control and Prevention (CDC): ? Call 5850708486 (1-800-CDC-INFO) or ? Visit CDC's website at http://hunter.com/ CDC Pneumococcal Polysaccharide Vaccine VIS (01/16/14) This information is not intended to replace advice given to you by your health care provider. Make sure you discuss any questions you have with your health care provider. Document Released: 07/09/2006 Document Revised: 06/01/2016 Document Reviewed: 06/01/2016 Elsevier Interactive Patient Education  2017 [employer].  Asthma, Acute Bronchospasm Acute bronchospasm caused by asthma is also referred to as an asthma attack.  Bronchospasm means your air passages become narrowed. The narrowing is caused by inflammation and tightening of the muscles in the air tubes (bronchi) in your lungs. This can make it hard to breathe or cause you to wheeze and cough. What are the causes? Possible triggers are:  Animal dander from the skin, hair, or feathers of animals.  Dust mites contained in house dust.  Cockroaches.  Pollen from trees or grass.  Mold.  Cigarette or tobacco smoke.  Air pollutants such as dust, household cleaners, hair sprays, aerosol sprays, paint fumes, strong chemicals, or strong odors.  Cold air or weather changes. Cold air may trigger inflammation. Winds increase molds and pollens in the air.  Strong emotions such as crying or laughing hard.  Stress.  Certain medicines such as aspirin or beta-blockers.  Sulfites in foods and drinks, such as dried fruits and wine.  Infections or inflammatory conditions, such as a flu, cold, or inflammation of the nasal membranes (rhinitis).  Gastroesophageal reflux disease (GERD). GERD is a condition where stomach acid backs up into your esophagus.  Exercise or strenuous activity.  What are the signs or symptoms?  Wheezing.  Excessive coughing, particularly at night.  Chest tightness.  Shortness of breath. How is this diagnosed? Your health care provider will ask you about your medical history and perform  a physical exam. A chest X-ray or blood testing may be performed to look for other causes of your symptoms or other conditions that may have triggered your asthma attack. How is this treated? Treatment is aimed at reducing inflammation and opening up the airways in your lungs. Most asthma attacks are treated with inhaled medicines. These include quick relief or rescue medicines (such as bronchodilators) and controller medicines (such as inhaled corticosteroids). These medicines are sometimes given through an inhaler or a nebulizer. Systemic steroid  medicine taken by mouth or given through an IV tube also can be used to reduce the inflammation when an attack is moderate or severe. Antibiotic medicines are only used if a bacterial infection is present. Follow these instructions at home:  Rest.  Drink plenty of liquids. This helps the mucus to remain thin and be easily coughed up. Only use caffeine in moderation and do not use alcohol until you have recovered from your illness.  Do not smoke. Avoid being exposed to secondhand smoke.  You play a critical role in keeping yourself in good health. Avoid exposure to things that cause you to wheeze or to have breathing problems.  Keep your medicines up-to-date and available. Carefully follow your health care provider's treatment plan.  Take your medicine exactly as prescribed.  When pollen or pollution is bad, keep windows closed and use an air conditioner or go to places with air conditioning.  Asthma requires careful medical care. See your health care provider for a follow-up as advised. If you are more than [redacted] weeks pregnant and you were prescribed any new medicines, let your obstetrician know about the visit and how you are doing. Follow up with your health care provider as directed.  After you have recovered from your asthma attack, make an appointment with your outpatient doctor to talk about ways to reduce the likelihood of future attacks. If you do not have a doctor who manages your asthma, make an appointment with a primary care doctor to discuss your asthma. Get help right away if:  You are getting worse.  You have trouble breathing. If severe, call your local emergency services (911 in the [place]  You develop chest pain or discomfort.  You are vomiting.  You are not able to keep fluids down.  You are coughing up yellow, green, brown, or bloody sputum.  You have a fever and your symptoms suddenly get worse.  You have trouble swallowing. This information is not intended to  replace advice given to you by your health care provider. Make sure you discuss any questions you have with your health care provider. Document Released: 12/27/2006 Document Revised: 02/23/2016 Document Reviewed: 03/19/2013 Elsevier Interactive Patient Education  2017 [employer].

## 2017-10-24 NOTE — Progress Notes (Signed)
Chief Complaint  Patient presents with  . Asthma    started Thursday with URI sx's. C/O Cough, SOB, & wheezing   Asthma exacerbation started Thursday with URI sx's and cough dry with wheezing, sob, chest tightness. Tried Mucinex Cough Max, Nyquil has coughed some white mucous. Triggers include winter, spring, pollen. She is coughing so much feels nauseated and gagging and having MSK back and chest pain. Denies sick contacts, not a smoker. She is feeling sob with talking and went to work yesterday and felt like she needed to come to the doctor. She has also tried Albuterol q4 hours at work inhaler and nebs q4 hours when not at work. W/o relief.    Review of Systems  Constitutional: Negative for chills and fever.  HENT: Negative for hearing loss.   Eyes: Negative for blurred vision.  Respiratory: Positive for cough, sputum production and shortness of breath.   Cardiovascular:       Chest tightness   Gastrointestinal: Positive for nausea. Negative for abdominal pain.  Musculoskeletal: Positive for back pain.  Skin: Negative for rash.   Past Medical History:  Diagnosis Date  . Asthma   . Depression    bipolar  . Fainting spell    H/O  . H/O cardiac arrhythmia   . Migraines   . Seizures ([place])    Past Surgical History:  Procedure Laterality Date  . TUBAL LIGATION Bilateral 09/2002   Family History  Problem Relation Age of Onset  . Hypertension Mother   . Diabetes Mother   . Arthritis Mother   . Heart disease Mother    Social History   Socioeconomic History  . Marital status: Married    Spouse name: Not on file  . Number of children: Not on file  . Years of education: Not on file  . Highest education level: Not on file  Social Needs  . Financial resource strain: Not on file  . Food insecurity - worry: Not on file  . Food insecurity - inability: Not on file  . Transportation needs - medical: Not on file  . Transportation needs - non-medical: Not on file  Occupational  History  . Not on file  Tobacco Use  . Smoking status: Former Smoker    Packs/day: 0.50    Types: Cigarettes  . Smokeless tobacco: Never Used  . Tobacco comment: Quit mid October 2014  Substance and Sexual Activity  . Alcohol use: No    Alcohol/week: 0.0 oz  . Drug use: No  . Sexual activity: Not on file  Other Topics Concern  . Not on file  Social History Narrative  . Not on file   Current Meds  Medication Sig  . albuterol (PROVENTIL HFA;VENTOLIN HFA) 108 (90 Base) MCG/ACT inhaler Inhale 2 puffs into the lungs every 6 (six) hours as needed for wheezing or shortness of breath.  Marland Kitchen albuterol (PROVENTIL) (2.5 MG/3ML) 0.083% nebulizer solution Take 3 mLs (2.5 mg total) by nebulization every 6 (six) hours as needed for wheezing or shortness of breath.  . [DISCONTINUED] fluticasone (FLOVENT HFA) 110 MCG/ACT inhaler Inhale 2 puffs into the lungs 2 (two) times daily.   Allergies  Allergen Reactions  . Augmentin [Amoxicillin-Pot Clavulanate]   . Erythromycin Nausea And Vomiting  . Lithium Nausea Only  . Tramadol     Seizures   No results found for this or any previous visit (from the past 2160 hour(s)). Objective  Body mass index is 28.33 kg/m. Wt Readings from Last 3 Encounters:  10/24/17 175 lb 8 oz (79.6 kg)  06/11/17 156 lb 2 oz (70.8 kg)  06/09/17 165 lb (74.8 kg)   Temp Readings from Last 3 Encounters:  10/24/17 97.9 F (36.6 C) (Oral)  06/11/17 98.3 F (36.8 C) (Oral)  06/09/17 99 F (37.2 C) (Oral)   BP Readings from Last 3 Encounters:  10/24/17 130/90  06/11/17 102/68  06/09/17 134/90   Pulse Readings from Last 3 Encounters:  10/24/17 (!) 102  06/11/17 90  06/09/17 99   O2 sat room air 99% Physical Exam  Constitutional: She is oriented to person, place, and time and well-developed, well-nourished, and in no distress.  HENT:  Head: Normocephalic and atraumatic.  Mouth/Throat: Oropharynx is clear and moist and mucous membranes are normal.  Eyes:  Conjunctivae are normal. Pupils are equal, round, and reactive to light.  Cardiovascular: Regular rhythm and normal heart sounds. Tachycardia present.  Pulmonary/Chest: Effort normal. She has wheezes.  Coughing on exam with mild use accessory muscles   Neurological: [name] and oriented to person, place, and time. Gait normal. Gait normal.  Skin: Skin is warm, dry and intact.  Psychiatric: Mood, memory, affect and judgment normal.  Nursing note and vitals reviewed.   Assessment   1. Asthma exacerbation  Plan  1. Prednisone taper, duoneb, singulair, Symbicort 160 d/c Flovent insurance advair not preferred, Hycodan continue Mucinex, Doxycycline bid x 1 week  D/c albuterol neb change to duoneb F/u in 1 week  Consider pna 23 vaccine in future  Congratulated not smoker  Provider: Dr. Olivia Mackie [facility]oc[facility]

## 2017-10-29 ENCOUNTER — Ambulatory Visit (INDEPENDENT_AMBULATORY_CARE_PROVIDER_SITE_OTHER): Payer: BLUE CROSS/BLUE SHIELD | Admitting: Internal Medicine

## 2017-10-29 ENCOUNTER — Encounter: Payer: Self-pay | Admitting: Internal Medicine

## 2017-10-29 VITALS — BP 148/90 | HR 104 | Temp 98.5°F | Resp 20 | Wt 174.8 lb

## 2017-10-29 DIAGNOSIS — G40909 Epilepsy, unspecified, not intractable, without status epilepticus: Secondary | ICD-10-CM | POA: Diagnosis not present

## 2017-10-29 DIAGNOSIS — Z1231 Encounter for screening mammogram for malignant neoplasm of breast: Secondary | ICD-10-CM

## 2017-10-29 DIAGNOSIS — J4520 Mild intermittent asthma, uncomplicated: Secondary | ICD-10-CM | POA: Diagnosis not present

## 2017-10-29 DIAGNOSIS — E7800 Pure hypercholesterolemia, unspecified: Secondary | ICD-10-CM | POA: Diagnosis not present

## 2017-10-29 DIAGNOSIS — G479 Sleep disorder, unspecified: Secondary | ICD-10-CM

## 2017-10-29 DIAGNOSIS — Z1239 Encounter for other screening for malignant neoplasm of breast: Secondary | ICD-10-CM

## 2017-10-29 MED ORDER — HYDROXYZINE PAMOATE 25 MG PO CAPS: 25.0000 mg | ORAL_CAPSULE | Freq: Every evening | ORAL | 0 refills | Status: DC | PRN

## 2017-10-29 NOTE — Progress Notes (Signed)
Patient ID: Heather [name], female   DOB: [birth year], 42 y.o.   MRN: [id number]   Subjective:    Patient ID: Heather [name], female    DOB: Nov 06, 1975, 42 y.o.   MRN: [id number]  HPI  Patient here for a scheduled follow up. Was seen 10/24/17 for urgent vist with URI symptoms and cough with wheezing, sob and chest tightness.  Was given prednisone taper and doxycycline.  Nebs/inhalers. She is dong better.  Breathing is better.  On symbicort now.  Eating. Does report some nausea.  No abdominal pain.  Bowels moving.  Having trouble sleeping.  Did not tolerate trazodone.     Past Medical History:  Diagnosis Date  . Asthma   . Depression    bipolar  . Fainting spell    H/O  . H/O cardiac arrhythmia   . Migraines   . Seizures ([place])    Past Surgical History:  Procedure Laterality Date  . TUBAL LIGATION Bilateral 09/2002   Family History  Problem Relation Age of Onset  . Hypertension Mother   . Diabetes Mother   . Arthritis Mother   . Heart disease Mother    Social History   Socioeconomic History  . Marital status: Married    Spouse name: None  . Number of children: None  . Years of education: None  . Highest education level: None  Social Needs  . Financial resource strain: None  . Food insecurity - worry: None  . Food insecurity - inability: None  . Transportation needs - medical: None  . Transportation needs - non-medical: None  Occupational History  . None  Tobacco Use  . Smoking status: Former Smoker    Packs/day: 0.50    Types: Cigarettes  . Smokeless tobacco: Never Used  . Tobacco comment: Quit mid October 2014  Substance and Sexual Activity  . Alcohol use: No    Alcohol/week: 0.0 oz  . Drug use: No  . Sexual activity: None  Other Topics Concern  . None  Social History Narrative  . None    Outpatient Encounter Medications as of 10/29/2017  Medication Sig  . albuterol (PROVENTIL HFA;VENTOLIN HFA) 108 (90 Base) MCG/ACT inhaler Inhale 1-2 puffs into the lungs  every 4 (four) hours as needed for wheezing or shortness of breath.  . budesonide-formoterol (SYMBICORT) 160-4.5 MCG/ACT inhaler Inhale 2 puffs into the lungs 2 (two) times daily. Rinse mouth  . doxycycline (VIBRA-TABS) 100 MG tablet Take 1 tablet (100 mg total) by mouth 2 (two) times daily. With food  . HYDROcodone-homatropine (HYCODAN) 5-1.5 MG/5ML syrup Take 5 mLs by mouth at bedtime as needed for cough.  Marland Kitchen ipratropium-albuterol (DUONEB) 0.5-2.5 (3) MG/3ML SOLN Take 3 mLs by nebulization every 4 (four) hours as needed.  . montelukast (SINGULAIR) 10 MG tablet Take 1 tablet (10 mg total) by mouth at bedtime.  . predniSONE (DELTASONE) 20 MG tablet 60 mg x 3 days, 40 mg x 3 days, 20 mg x 3 days, 10 mg x 3 days then stop  . hydrOXYzine (VISTARIL) 25 MG capsule Take 1 capsule (25 mg total) by mouth at bedtime as needed.   No facility-administered encounter medications on file as of 10/29/2017.     Review of Systems  Constitutional: Negative for fever.       Has gained weight.   HENT: Positive for congestion. Negative for sinus pressure.   Respiratory: Positive for cough. Negative for chest tightness.        Breathing better.  Cardiovascular: Negative for chest pain, palpitations and leg swelling.  Gastrointestinal: Positive for nausea. Negative for abdominal pain and diarrhea.  Genitourinary: Negative for difficulty urinating and dysuria.  Musculoskeletal: Negative for joint swelling and myalgias.  Skin: Negative for color change and rash.  Neurological: Negative for dizziness, light-headedness and headaches.  Psychiatric/Behavioral: Negative for agitation and dysphoric mood.       Objective:    Physical Exam  Constitutional: She appears well-developed and well-nourished. No distress.  HENT:  Nose: Nose normal.  Mouth/Throat: Oropharynx is clear and moist.  Neck: Neck supple. No thyromegaly present.  Cardiovascular: Normal rate and regular rhythm.  Pulmonary/Chest: Breath sounds  normal. No respiratory distress. She has no wheezes.  Abdominal: Soft. Bowel sounds are normal. There is no tenderness.  Musculoskeletal: She exhibits no edema or tenderness.  Lymphadenopathy:    She has no cervical adenopathy.  Skin: No rash noted. No erythema.  Psychiatric: She has a normal mood and affect. Her behavior is normal.    BP (!) 148/90 (BP Location: Left Arm, Patient Position: Sitting, Cuff Size: Normal)   Pulse (!) 104   Temp 98.5 F (36.9 C) (Oral)   Resp 20   Wt 174 lb 12.8 oz (79.3 kg)   SpO2 98%   BMI 28.21 kg/m  Wt Readings from Last 3 Encounters:  10/29/17 174 lb 12.8 oz (79.3 kg)  10/24/17 175 lb 8 oz (79.6 kg)  06/11/17 156 lb 2 oz (70.8 kg)     Lab Results  Component Value Date   WBC 10.6 (H) 10/19/2016   HGB 14.2 10/19/2016   HCT 41.7 10/19/2016   PLT 248.0 10/19/2016   GLUCOSE 98 10/19/2016   CHOL 214 (H) 10/19/2016   TRIG 148.0 10/19/2016   HDL 36.70 (L) 10/19/2016   LDLCALC 148 (H) 10/19/2016   ALT 10 10/19/2016   AST 10 10/19/2016   NA 140 10/19/2016   K 3.6 10/19/2016   CL 109 10/19/2016   CREATININE 0.66 10/19/2016   BUN 6 10/19/2016   CO2 27 10/19/2016   TSH 1.19 10/19/2016       Assessment & Plan:   Problem List Items Addressed This Visit    Asthma    Recently treated for infection.  Doing better.  Continue inhalers and nebs as she is doing.  Breathing better.  Follow.       Hypercholesterolemia    Low cholesterol diet and exercise.  Follow lipid panel.        Relevant Orders   CBC with Differential/Platelet   Comprehensive metabolic panel   TSH   Lipid panel   Seizure disorder ([place])    No recent seizures.  On no medication.  Follow.        Sleeping difficulty    Having trouble sleeping.  Trial of hydroxyzine.  Did not tolerate trazodone.  Follow.        Other Visit Diagnoses    Breast cancer screening    -  Primary   Relevant Orders   MM DIGITAL SCREENING BILATERAL       Einar Pheasant, MD

## 2017-10-31 ENCOUNTER — Encounter: Payer: Self-pay | Admitting: Internal Medicine

## 2017-10-31 DIAGNOSIS — G479 Sleep disorder, unspecified: Secondary | ICD-10-CM | POA: Insufficient documentation

## 2017-10-31 NOTE — Assessment & Plan Note (Signed)
Recently treated for infection.  Doing better.  Continue inhalers and nebs as she is doing.  Breathing better.  Follow.

## 2017-10-31 NOTE — Assessment & Plan Note (Signed)
Having trouble sleeping.  Trial of hydroxyzine.  Did not tolerate trazodone.  Follow.

## 2017-10-31 NOTE — Assessment & Plan Note (Signed)
No recent seizures.  On no medication.  Follow.

## 2017-10-31 NOTE — Assessment & Plan Note (Signed)
Low cholesterol diet and exercise.  Follow lipid panel.   

## 2017-11-12 ENCOUNTER — Other Ambulatory Visit: Payer: BLUE CROSS/BLUE SHIELD

## 2017-11-16 ENCOUNTER — Ambulatory Visit
Admission: RE | Admit: 2017-11-16 | Discharge: 2017-11-16 | Disposition: A | Discharge status: Home / Self Care | Payer: BLUE CROSS/BLUE SHIELD | Source: Ambulatory Visit | Attending: Internal Medicine | Admitting: Internal Medicine

## 2017-11-16 DIAGNOSIS — Z1239 Encounter for other screening for malignant neoplasm of breast: Secondary | ICD-10-CM

## 2017-11-16 DIAGNOSIS — Z1231 Encounter for screening mammogram for malignant neoplasm of breast: Secondary | ICD-10-CM | POA: Insufficient documentation

## 2017-11-19 ENCOUNTER — Other Ambulatory Visit (INDEPENDENT_AMBULATORY_CARE_PROVIDER_SITE_OTHER): Payer: BLUE CROSS/BLUE SHIELD

## 2017-11-19 DIAGNOSIS — E7800 Pure hypercholesterolemia, unspecified: Secondary | ICD-10-CM

## 2017-11-19 LAB — CBC WITH DIFFERENTIAL/PLATELET
BASOS ABS: 0.1 10*3/uL (ref 0.0–0.1)
Basophils Relative: 1 % (ref 0.0–3.0)
Eosinophils Absolute: 0.2 10*3/uL (ref 0.0–0.7)
Eosinophils Relative: 1.7 % (ref 0.0–5.0)
HEMATOCRIT: 40 % (ref 36.0–46.0)
HEMOGLOBIN: 13.6 g/dL (ref 12.0–15.0)
LYMPHS PCT: 24.7 % (ref 12.0–46.0)
Lymphs Abs: 3 10*3/uL (ref 0.7–4.0)
MCHC: 34 g/dL (ref 30.0–36.0)
MCV: 86.4 fl (ref 78.0–100.0)
MONOS PCT: 6.7 % (ref 3.0–12.0)
Monocytes Absolute: 0.8 10*3/uL (ref 0.1–1.0)
Neutro Abs: 8 10*3/uL — ABNORMAL HIGH (ref 1.4–7.7)
Neutrophils Relative %: 65.9 % (ref 43.0–77.0)
Platelets: 289 10*3/uL (ref 150.0–400.0)
RBC: 4.63 Mil/uL (ref 3.87–5.11)
RDW: 13.4 % (ref 11.5–15.5)
WBC: 12.1 10*3/uL — ABNORMAL HIGH (ref 4.0–10.5)

## 2017-11-19 LAB — TSH: TSH: 1.34 u[IU]/mL (ref 0.35–4.50)

## 2017-11-19 LAB — LIPID PANEL
Cholesterol: 214 mg/dL — ABNORMAL HIGH (ref 0–200)
HDL: 37.2 mg/dL — AB (ref >39.00)
LDL CALC: 141 mg/dL — AB (ref 0–99)
NONHDL: 176.93
Total CHOL/HDL Ratio: 6
Triglycerides: 181 mg/dL — ABNORMAL HIGH (ref 0.0–149.0)
VLDL: 36.2 mg/dL (ref 0.0–40.0)

## 2017-11-19 LAB — COMPREHENSIVE METABOLIC PANEL
ALK PHOS: 100 U/L (ref 39–117)
ALT: 14 U/L (ref 0–35)
AST: 13 U/L (ref 0–37)
Albumin: 4.2 g/dL (ref 3.5–5.2)
BILIRUBIN TOTAL: 0.3 mg/dL (ref 0.2–1.2)
BUN: 12 mg/dL (ref 6–23)
CALCIUM: 9.9 mg/dL (ref 8.4–10.5)
CO2: 24 mEq/L (ref 19–32)
Chloride: 103 mEq/L (ref 96–112)
Creatinine, Ser: 1.03 mg/dL (ref 0.40–1.20)
GFR: 62.3 mL/min (ref >60.00)
Glucose, Bld: 84 mg/dL (ref 70–99)
Potassium: 4.4 mEq/L (ref 3.5–5.1)
Sodium: 138 mEq/L (ref 135–145)
TOTAL PROTEIN: 6.8 g/dL (ref 6.0–8.3)

## 2017-11-23 ENCOUNTER — Telehealth: Payer: Self-pay | Admitting: Internal Medicine

## 2017-11-23 NOTE — Telephone Encounter (Signed)
Pt given lab results and documented in result note.  

## 2017-11-23 NOTE — Telephone Encounter (Signed)
Copied from [place] [id number]. Topic: Quick Communication - Lab Results >> Nov 22, 2017  4:07 PM Lars Masson, LPN wrote: Called patient to inform them of lab results. When patient returns call, triage nurse may disclose results.   NOTE: 11/23/2017 8:57am Patient called for labs. No [facility] RN available. Please call back.

## 2017-11-30 ENCOUNTER — Encounter: Payer: Self-pay | Admitting: Internal Medicine

## 2017-11-30 ENCOUNTER — Ambulatory Visit (INDEPENDENT_AMBULATORY_CARE_PROVIDER_SITE_OTHER): Payer: BLUE CROSS/BLUE SHIELD | Admitting: Internal Medicine

## 2017-11-30 DIAGNOSIS — G40909 Epilepsy, unspecified, not intractable, without status epilepticus: Secondary | ICD-10-CM

## 2017-11-30 DIAGNOSIS — J4520 Mild intermittent asthma, uncomplicated: Secondary | ICD-10-CM

## 2017-11-30 DIAGNOSIS — E7800 Pure hypercholesterolemia, unspecified: Secondary | ICD-10-CM | POA: Diagnosis not present

## 2017-11-30 NOTE — Progress Notes (Signed)
Patient ID: Heather [name], female   DOB: 11-04-1975, 42 y.o.   MRN: [id number]   Subjective:    Patient ID: Heather [name], female    DOB: August 13, 1976, 42 y.o.   MRN: [id number]  HPI  Patient here for a scheduled physical.  She declines physical today.  States she is feeling better.  Breathing better.  No chest pain.  No increased cough or congestion.  No acid reflux.  No abdominal pain.  Bowels moving.  Handling stress.     Past Medical History:  Diagnosis Date  . Asthma   . Depression    bipolar  . Fainting spell    H/O  . H/O cardiac arrhythmia   . Migraines   . Seizures ([place])    Past Surgical History:  Procedure Laterality Date  . TUBAL LIGATION Bilateral 09/2002   Family History  Problem Relation Age of Onset  . Hypertension Mother   . Diabetes Mother   . Arthritis Mother   . Heart disease Mother   . Breast cancer Neg Hx    Social History   Socioeconomic History  . Marital status: Married    Spouse name: None  . Number of children: None  . Years of education: None  . Highest education level: None  Social Needs  . Financial resource strain: None  . Food insecurity - worry: None  . Food insecurity - inability: None  . Transportation needs - medical: None  . Transportation needs - non-medical: None  Occupational History  . None  Tobacco Use  . Smoking status: Former Smoker    Packs/day: 0.50    Types: Cigarettes  . Smokeless tobacco: Never Used  . Tobacco comment: Quit mid October 2014  Substance and Sexual Activity  . Alcohol use: No    Alcohol/week: 0.0 oz  . Drug use: No  . Sexual activity: None  Other Topics Concern  . None  Social History Narrative  . None    Outpatient Encounter Medications as of 11/30/2017  Medication Sig  . albuterol (PROVENTIL HFA;VENTOLIN HFA) 108 (90 Base) MCG/ACT inhaler Inhale 1-2 puffs into the lungs every 4 (four) hours as needed for wheezing or shortness of breath.  . budesonide-formoterol (SYMBICORT) 160-4.5 MCG/ACT  inhaler Inhale 2 puffs into the lungs 2 (two) times daily. Rinse mouth  . montelukast (SINGULAIR) 10 MG tablet Take 1 tablet (10 mg total) by mouth at bedtime.  Marland Kitchen ipratropium-albuterol (DUONEB) 0.5-2.5 (3) MG/3ML SOLN Take 3 mLs by nebulization every 4 (four) hours as needed. (Patient not taking: Reported on 11/30/2017)  . [DISCONTINUED] doxycycline (VIBRA-TABS) 100 MG tablet Take 1 tablet (100 mg total) by mouth 2 (two) times daily. With food (Patient not taking: Reported on 11/30/2017)  . [DISCONTINUED] HYDROcodone-homatropine (HYCODAN) 5-1.5 MG/5ML syrup Take 5 mLs by mouth at bedtime as needed for cough. (Patient not taking: Reported on 11/30/2017)  . [DISCONTINUED] hydrOXYzine (VISTARIL) 25 MG capsule Take 1 capsule (25 mg total) by mouth at bedtime as needed. (Patient not taking: Reported on 11/30/2017)  . [DISCONTINUED] predniSONE (DELTASONE) 20 MG tablet 60 mg x 3 days, 40 mg x 3 days, 20 mg x 3 days, 10 mg x 3 days then stop (Patient not taking: Reported on 11/30/2017)   No facility-administered encounter medications on file as of 11/30/2017.     Review of Systems  Constitutional: Negative for appetite change and unexpected weight change.  HENT: Negative for congestion and sinus pressure.   Respiratory: Negative for cough, chest tightness and shortness  of breath.   Cardiovascular: Negative for chest pain, palpitations and leg swelling.  Gastrointestinal: Negative for abdominal pain, diarrhea, nausea and vomiting.  Genitourinary: Negative for difficulty urinating and dysuria.  Musculoskeletal: Negative for joint swelling and myalgias.  Skin: Negative for color change and rash.  Neurological: Negative for dizziness, light-headedness and headaches.  Psychiatric/Behavioral: Negative for agitation and dysphoric mood.       Objective:    Physical Exam  Constitutional: She appears well-developed and well-nourished. No distress.  HENT:  Nose: Nose normal.  Mouth/Throat: Oropharynx is clear and  moist.  Neck: Neck supple. No thyromegaly present.  Cardiovascular: Normal rate and regular rhythm.  Pulmonary/Chest: Breath sounds normal. No respiratory distress. She has no wheezes.  Abdominal: Soft. Bowel sounds are normal. There is no tenderness.  Musculoskeletal: She exhibits no edema or tenderness.  Lymphadenopathy:    She has no cervical adenopathy.  Skin: No rash noted. No erythema.  Psychiatric: She has a normal mood and affect. Her behavior is normal.    BP 124/82 (BP Location: Left Arm, Patient Position: Sitting, Cuff Size: Normal)   Pulse 86   Temp 97.7 F (36.5 C) (Oral)   Resp 16   Wt 174 lb 9.6 oz (79.2 kg)   SpO2 98%   BMI 28.18 kg/m  Wt Readings from Last 3 Encounters:  11/30/17 174 lb 9.6 oz (79.2 kg)  10/29/17 174 lb 12.8 oz (79.3 kg)  10/24/17 175 lb 8 oz (79.6 kg)     Lab Results  Component Value Date   WBC 12.1 (H) 11/19/2017   HGB 13.6 11/19/2017   HCT 40.0 11/19/2017   PLT 289.0 11/19/2017   GLUCOSE 84 11/19/2017   CHOL 214 (H) 11/19/2017   TRIG 181.0 (H) 11/19/2017   HDL 37.20 (L) 11/19/2017   LDLCALC 141 (H) 11/19/2017   ALT 14 11/19/2017   AST 13 11/19/2017   NA 138 11/19/2017   K 4.4 11/19/2017   CL 103 11/19/2017   CREATININE 1.03 11/19/2017   BUN 12 11/19/2017   CO2 24 11/19/2017   TSH 1.34 11/19/2017    Mm Digital Screening Bilateral  Result Date: 11/16/2017 CLINICAL DATA:  Screening. EXAM: DIGITAL SCREENING BILATERAL MAMMOGRAM WITH CAD COMPARISON:  None. ACR Breast Density Category b: There are scattered areas of fibroglandular density. FINDINGS: There are no findings suspicious for malignancy. Images were processed with CAD. IMPRESSION: No mammographic evidence of malignancy. A result letter of this screening mammogram will be mailed directly to the patient. RECOMMENDATION: Screening mammogram in one year. (Code:SM-B-01Y) BI-RADS CATEGORY  1: Negative. Electronically Signed   By: Curlene Dolphin M.D.   On: 11/16/2017 11:09         Assessment & Plan:   Problem List Items Addressed This Visit    Asthma    Breathing better.  Doing better.  Follow.        Hypercholesterolemia    Low cholesterol diet and exercise.  Follow lipid panel.       Seizure disorder ([place])    No recent seizures.  On no medication.  Follow.            Einar Pheasant, MD

## 2017-12-02 ENCOUNTER — Encounter: Payer: Self-pay | Admitting: Internal Medicine

## 2017-12-02 NOTE — Assessment & Plan Note (Signed)
Low cholesterol diet and exercise.  Follow lipid panel.   

## 2017-12-02 NOTE — Assessment & Plan Note (Signed)
Breathing better.  Doing better.  Follow.

## 2017-12-02 NOTE — Assessment & Plan Note (Signed)
No recent seizures.  On no medication.  Follow.

## 2018-03-01 ENCOUNTER — Ambulatory Visit: Payer: BLUE CROSS/BLUE SHIELD | Admitting: Internal Medicine

## 2018-03-01 DIAGNOSIS — Z0289 Encounter for other administrative examinations: Secondary | ICD-10-CM

## 2018-03-21 ENCOUNTER — Encounter: Payer: Self-pay | Admitting: Family Medicine

## 2018-03-21 ENCOUNTER — Ambulatory Visit (INDEPENDENT_AMBULATORY_CARE_PROVIDER_SITE_OTHER): Payer: BLUE CROSS/BLUE SHIELD | Admitting: Family Medicine

## 2018-03-21 VITALS — BP 126/90 | HR 104 | Temp 98.6°F | Resp 16 | Wt 177.1 lb

## 2018-03-21 DIAGNOSIS — J4541 Moderate persistent asthma with (acute) exacerbation: Secondary | ICD-10-CM

## 2018-03-21 MED ORDER — AZITHROMYCIN 250 MG PO TABS: ORAL_TABLET | ORAL | 0 refills | Status: DC

## 2018-03-21 MED ORDER — HYDROCODONE-HOMATROPINE 5-1.5 MG/5ML PO SYRP
5.0000 mL | ORAL_SOLUTION | Freq: Three times a day (TID) | ORAL | 0 refills | Status: DC | PRN
Start: 2018-03-21 — End: 2018-05-23

## 2018-03-21 MED ORDER — PREDNISONE 10 MG PO TABS: ORAL_TABLET | ORAL | 0 refills | Status: DC

## 2018-03-21 NOTE — Patient Instructions (Signed)
If symptoms worsen, please seek care in the ED for worsening shortness of breath.  Continue Mucinex   Hycodan for cough  Ibuprofen can be used for discomfort.  Follow up in one to two weeks or sooner if needed.  If no improvement, we will consider an x-ray.   Asthma, Acute Bronchospasm Acute bronchospasm caused by asthma is also referred to as an asthma attack. Bronchospasm means your air passages become narrowed. The narrowing is caused by inflammation and tightening of the muscles in the air tubes (bronchi) in your lungs. This can make it hard to breathe or cause you to wheeze and cough. What are the causes? Possible triggers are:  Animal dander from the skin, hair, or feathers of animals.  Dust mites contained in house dust.  Cockroaches.  Pollen from trees or grass.  Mold.  Cigarette or tobacco smoke.  Air pollutants such as dust, household cleaners, hair sprays, aerosol sprays, paint fumes, strong chemicals, or strong odors.  Cold air or weather changes. Cold air may trigger inflammation. Winds increase molds and pollens in the air.  Strong emotions such as crying or laughing hard.  Stress.  Certain medicines such as aspirin or beta-blockers.  Sulfites in foods and drinks, such as dried fruits and wine.  Infections or inflammatory conditions, such as a flu, cold, or inflammation of the nasal membranes (rhinitis).  Gastroesophageal reflux disease (GERD). GERD is a condition where stomach acid backs up into your esophagus.  Exercise or strenuous activity.  What are the signs or symptoms?  Wheezing.  Excessive coughing, particularly at night.  Chest tightness.  Shortness of breath. How is this diagnosed? Your health care provider will ask you about your medical history and perform a physical exam. A chest X-ray or blood testing may be performed to look for other causes of your symptoms or other conditions that may have triggered your asthma attack. How is  this treated? Treatment is aimed at reducing inflammation and opening up the airways in your lungs. Most asthma attacks are treated with inhaled medicines. These include quick relief or rescue medicines (such as bronchodilators) and controller medicines (such as inhaled corticosteroids). These medicines are sometimes given through an inhaler or a nebulizer. Systemic steroid medicine taken by mouth or given through an IV tube also can be used to reduce the inflammation when an attack is moderate or severe. Antibiotic medicines are only used if a bacterial infection is present. Follow these instructions at home:  Rest.  Drink plenty of liquids. This helps the mucus to remain thin and be easily coughed up. Only use caffeine in moderation and do not use alcohol until you have recovered from your illness.  Do not smoke. Avoid being exposed to secondhand smoke.  You play a critical role in keeping yourself in good health. Avoid exposure to things that cause you to wheeze or to have breathing problems.  Keep your medicines up-to-date and available. Carefully follow your health care provider's treatment plan.  Take your medicine exactly as prescribed.  When pollen or pollution is bad, keep windows closed and use an air conditioner or go to places with air conditioning.  Asthma requires careful medical care. See your health care provider for a follow-up as advised. If you are more than [redacted] weeks pregnant and you were prescribed any new medicines, let your obstetrician know about the visit and how you are doing. Follow up with your health care provider as directed.  After you have recovered from your asthma attack,  make an appointment with your outpatient doctor to talk about ways to reduce the likelihood of future attacks. If you do not have a doctor who manages your asthma, make an appointment with a primary care doctor to discuss your asthma. Get help right away if:  You are getting worse.  You have  trouble breathing. If severe, call your local emergency services (911 in the [place]  You develop chest pain or discomfort.  You are vomiting.  You are not able to keep fluids down.  You are coughing up yellow, green, brown, or bloody sputum.  You have a fever and your symptoms suddenly get worse.  You have trouble swallowing. This information is not intended to replace advice given to you by your health care provider. Make sure you discuss any questions you have with your health care provider. Document Released: 12/27/2006 Document Revised: 02/23/2016 Document Reviewed: 03/19/2013 Elsevier Interactive Patient Education  2017 [place] [employer].  Asthma, Adult Asthma is a condition of the lungs in which the airways tighten and narrow. Asthma can make it hard to breathe. Asthma cannot be cured, but medicine and lifestyle changes can help control it. Asthma may be started (triggered) by:  Animal skin flakes (dander).  Dust.  Cockroaches.  Pollen.  Mold.  Smoke.  Cleaning products.  Hair sprays or aerosol sprays.  Paint fumes or strong smells.  Cold air, weather changes, and winds.  Crying or laughing hard.  Stress.  Certain medicines or drugs.  Foods, such as dried fruit, potato chips, and sparkling grape juice.  Infections or conditions (colds, flu).  Exercise.  Certain medical conditions or diseases.  Exercise or tiring activities.  Follow these instructions at home:  Take medicine as told by your doctor.  Use a peak flow meter as told by your doctor. A peak flow meter is a tool that measures how well the lungs are working.  Record and keep track of the peak flow meter's readings.  Understand and use the asthma action plan. An asthma action plan is a written plan for taking care of your asthma and treating your attacks.  To help prevent asthma attacks: ? Do not smoke. Stay away from secondhand smoke. ? Change your heating and air conditioning filter  often. ? Limit your use of fireplaces and wood stoves. ? Get rid of pests (such as roaches and mice) and their droppings. ? Throw away plants if you see mold on them. ? Clean your floors. Dust regularly. Use cleaning products that do not smell. ? Have someone vacuum when you are not home. Use a vacuum cleaner with a HEPA filter if possible. ? Replace carpet with wood, tile, or vinyl flooring. Carpet can trap animal skin flakes and dust. ? Use allergy-proof pillows, mattress covers, and box spring covers. ? Wash bed sheets and blankets every week in hot water and dry them in a dryer. ? Use blankets that are made of polyester or cotton. ? Clean bathrooms and kitchens with bleach. If possible, have someone repaint the walls in these rooms with mold-resistant paint. Keep out of the rooms that are being cleaned and painted. ? Wash hands often. Contact a doctor if:  You have make a whistling sound when breaking (wheeze), have shortness of breath, or have a cough even if taking medicine to prevent attacks.  The colored mucus you cough up (sputum) is thicker than usual.  The colored mucus you cough up changes from clear or white to yellow, green, gray, or bloody.  You  have problems from the medicine you are taking such as: ? A rash. ? Itching. ? Swelling. ? Trouble breathing.  You need reliever medicines more than 2-3 times a week.  Your peak flow measurement is still at 50-79% of your personal best after following the action plan for 1 hour.  You have a fever. Get help right away if:  You seem to be worse and are not responding to medicine during an asthma attack.  You are short of breath even at rest.  You get short of breath when doing very little activity.  You have trouble eating, drinking, or talking.  You have chest pain.  You have a fast heartbeat.  Your lips or fingernails start to turn blue.  You are light-headed, dizzy, or faint.  Your peak flow is less than 50% of  your personal best. This information is not intended to replace advice given to you by your health care provider. Make sure you discuss any questions you have with your health care provider. Document Released: 02/28/2008 Document Revised: 02/17/2016 Document Reviewed: 04/10/2013 Elsevier Interactive Patient Education  2017 [employer].

## 2018-03-21 NOTE — Progress Notes (Signed)
Patient ID: Heather [name], female   DOB: [birth year], 42 y.o.   MRN: [id number]  PCP: Einar Pheasant, MD  Subjective:  Heather [name] is a 42 y.o. year old very pleasant female patient who presents with  symptoms including cough and chest congestion.  - does have wheeze as well. SOB with "coughing spells" -started: 2 weeks ago , symptoms are not improving -previous treatments: Robitussin and mucinex have provided limited benefit -sick contacts/travel/risks: denies flu exposure, possible sick contact exposure. Duoneb and albuterol have been used daily. She reports that some days are better than others but has been used 3 times daily at the beginning of symptoms. She reports that she will either use her nebulizer or albuterol inhaler daily since symptoms started. Symptoms may have been triggered after working in the yard. Associated MSK pain with coughing which is similar to previous episodes of same symptoms. She is not a smoker  She is adherent with symbicort and montelukast daily.   ROS-denies fever, NVD, tooth pain. Denies significant shortness of breath other than coughing spells.    Pertinent Past Medical History- Asthma Patient Active Problem List   Diagnosis Date Noted  . Sleeping difficulty 10/31/2017  . Rash 06/11/2017  . Health care maintenance 10/12/2016  . History of miscarriage 10/12/2016  . Hypercholesterolemia 07/09/2016  . Migraine 07/20/2013  . Seizure disorder ([place]) 07/20/2013  . Asthma 07/20/2013    Medications- reviewed  Current Outpatient Medications  Medication Sig Dispense Refill  . albuterol (PROVENTIL HFA;VENTOLIN HFA) 108 (90 Base) MCG/ACT inhaler Inhale 1-2 puffs into the lungs every 4 (four) hours as needed for wheezing or shortness of breath. 1 Inhaler 11  . budesonide-formoterol (SYMBICORT) 160-4.5 MCG/ACT inhaler Inhale 2 puffs into the lungs 2 (two) times daily. Rinse mouth 1 Inhaler 12  . ipratropium-albuterol (DUONEB) 0.5-2.5 (3) MG/3ML SOLN Take 3  mLs by nebulization every 4 (four) hours as needed. 360 mL 2  . montelukast (SINGULAIR) 10 MG tablet Take 1 tablet (10 mg total) by mouth at bedtime. 90 tablet 3   No current facility-administered medications for this visit.     Objective: BP 126/90 (BP Location: Left Arm, Patient Position: Sitting, Cuff Size: Normal)   Pulse (!) 104   Temp 98.6 F (37 C) (Oral)   Resp 16   Wt 177 lb 2 oz (80.3 kg)   SpO2 98%   BMI 28.59 kg/m  Gen: NAD, resting comfortably HEENT: Turbinates erythematous, TMs normal bilaterally, oropharynx clear and moist, no sinus tenderness CV: RRR no murmurs rubs or gallops, tachycardia present Lungs: Scattered wheezing present; coughing during exam; no crackles or rhonchi  Ext: no edema Skin: warm, dry, no rash  Assessment/Plan: 1. Moderate persistent asthma with exacerbation Symptoms are consistent with asthma exacerbation. Trigger of yard work and possible sick contacts are noted. Provide a prednisone taper, continue with montelukast, symbicort, and use of nebulizer for symptoms. Hycodan for cough and provided azithromycin due to symptom duration. She stated that she has taken hycodan and azithromycin before without adverse effects.  Advised close follow up within one to two weeks or sooner if needed.  We reviewed reasons for seeking care in the ED related to increasing SOB. Patient voiced understanding and agreed with plan.   - predniSONE (DELTASONE) 10 MG tablet; 5 tablets (50mg ) daily for 3 days, then 4 tablets (40 mg) daily for 3 days, then 3 tablets (30mg ) daily for 3 days, then 2 tablets (20 mg) daily for 3 days, then 1 tablet (10 mg)  daily for 3 days. 45 tablets.  Dispense: 45 tablet; Refill: 0 - azithromycin (ZITHROMAX) 250 MG tablet; Take 2 tablets by mouth at once today, then one tablet daily x 4 days.  Dispense: 6 tablet; Refill: [place], FNP

## 2018-05-23 ENCOUNTER — Encounter: Payer: Self-pay | Admitting: Internal Medicine

## 2018-05-23 ENCOUNTER — Ambulatory Visit (INDEPENDENT_AMBULATORY_CARE_PROVIDER_SITE_OTHER): Payer: BLUE CROSS/BLUE SHIELD | Admitting: Internal Medicine

## 2018-05-23 DIAGNOSIS — H1032 Unspecified acute conjunctivitis, left eye: Secondary | ICD-10-CM | POA: Diagnosis not present

## 2018-05-23 DIAGNOSIS — H109 Unspecified conjunctivitis: Secondary | ICD-10-CM | POA: Insufficient documentation

## 2018-05-23 MED ORDER — ERYTHROMYCIN 5 MG/GM OP OINT: 1.0000 "application " | TOPICAL_OINTMENT | Freq: Four times a day (QID) | OPHTHALMIC | 0 refills | Status: DC

## 2018-05-23 NOTE — Progress Notes (Signed)
Pre visit review using our clinic review tool, if applicable. No additional management support is needed unless otherwise documented below in the visit note. 

## 2018-05-23 NOTE — Patient Instructions (Signed)

## 2018-05-23 NOTE — Progress Notes (Signed)
Chief Complaint  Patient presents with  . Conjunctivitis   C/o left eye redness and soreness bottom worse with crusting and oozing and stringy material from eye tried cold compress daughter with her today and no sx's. Sxs started yesterday    Review of Systems  Eyes: Positive for discharge and redness.  Respiratory: Negative for cough and shortness of breath.   Cardiovascular: Negative for chest pain.   Past Medical History:  Diagnosis Date  . Asthma   . Depression    bipolar  . Fainting spell    H/O  . H/O cardiac arrhythmia   . Migraines   . Seizures ([place] [name])    Past Surgical History:  Procedure Laterality Date  . TUBAL LIGATION Bilateral 09/2002   Family History  Problem Relation Age of Onset  . Hypertension Mother   . Diabetes Mother   . Arthritis Mother   . Heart disease Mother   . Breast cancer Neg Hx    Social History   Socioeconomic History  . Marital status: Married    Spouse name: Not on file  . Number of children: Not on file  . Years of education: Not on file  . Highest education level: Not on file  Occupational History  . Not on file  Social Needs  . Financial resource strain: Not on file  . Food insecurity:    Worry: Not on file    Inability: Not on file  . Transportation needs:    Medical: Not on file    Non-medical: Not on file  Tobacco Use  . Smoking status: Former Smoker    Packs/day: 0.50    Types: Cigarettes  . Smokeless tobacco: Never Used  . Tobacco comment: Quit mid October 2014  Substance and Sexual Activity  . Alcohol use: No    Alcohol/week: 0.0 standard drinks  . Drug use: No  . Sexual activity: Not on file  Lifestyle  . Physical activity:    Days per week: Not on file    Minutes per session: Not on file  . Stress: Not on file  Relationships  . Social connections:    Talks on phone: Not on file    Gets together: Not on file    Attends religious service: Not on file    Active member of club or organization: Not on file     Attends meetings of clubs or organizations: Not on file    Relationship status: Not on file  . Intimate partner violence:    Fear of current or ex partner: Not on file    Emotionally abused: Not on file    Physically abused: Not on file    Forced sexual activity: Not on file  Other Topics Concern  . Not on file  Social History Narrative  . Not on file   Current Meds  Medication Sig  . albuterol (PROVENTIL HFA;VENTOLIN HFA) 108 (90 Base) MCG/ACT inhaler Inhale 1-2 puffs into the lungs every 4 (four) hours as needed for wheezing or shortness of breath.  . budesonide-formoterol (SYMBICORT) 160-4.5 MCG/ACT inhaler Inhale 2 puffs into the lungs 2 (two) times daily. Rinse mouth  . ipratropium-albuterol (DUONEB) 0.5-2.5 (3) MG/3ML SOLN Take 3 mLs by nebulization every 4 (four) hours as needed.  . montelukast (SINGULAIR) 10 MG tablet Take 1 tablet (10 mg total) by mouth at bedtime.   Allergies  Allergen Reactions  . Augmentin [Amoxicillin-Pot Clavulanate]   . Erythromycin Nausea And Vomiting  . Lithium Nausea Only  . Tramadol  Seizures   No results found for this or any previous visit (from the past 2160 hour(s)). Objective  Body mass index is 28.6 kg/m. Wt Readings from Last 3 Encounters:  05/23/18 177 lb 3.2 oz (80.4 kg)  03/21/18 177 lb 2 oz (80.3 kg)  11/30/17 174 lb 9.6 oz (79.2 kg)   Temp Readings from Last 3 Encounters:  05/23/18 97.9 F (36.6 C) (Oral)  03/21/18 98.6 F (37 C) (Oral)  11/30/17 97.7 F (36.5 C) (Oral)   BP Readings from Last 3 Encounters:  05/23/18 128/80  03/21/18 126/90  11/30/17 124/82   Pulse Readings from Last 3 Encounters:  05/23/18 92  03/21/18 (!) 104  11/30/17 86    Physical Exam  Constitutional: She is oriented to person, place, and time. Vital signs are normal. She appears well-developed and well-nourished. She is cooperative.  HENT:  Head: Normocephalic and atraumatic.  Mouth/Throat: Oropharynx is clear and moist and mucous  membranes are normal.  Eyes: Pupils are equal, round, and reactive to light. Right eye exhibits no discharge. Left eye exhibits no discharge. Left conjunctiva is injected. Left conjunctiva has no hemorrhage.  Cardiovascular: Normal rate, regular rhythm and normal heart sounds.  Pulmonary/Chest: Effort normal and breath sounds normal.  Neurological: [name] and oriented to person, place, and time.  Skin: Skin is warm and dry.  Psychiatric: She has a normal mood and affect. Her speech is normal and behavior is normal. Judgment and thought content normal. Cognition and memory are normal.  Nursing note and vitals reviewed.   Assessment   1. Left conjunctivitis  Plan  1. E mycin Cold compress  Call back if not better   Provider: Dr. Olivia Mackie McLean-Scocuzza-[facility]

## 2018-10-15 ENCOUNTER — Ambulatory Visit: Payer: Self-pay

## 2018-10-15 NOTE — Telephone Encounter (Signed)
Pt. Reported passing flecks of bright red blood per rectum followed by pink tinge on toilet tissue, last evening.  Stated she had about 2 hr. episode of left lower abdominal pain, that radiated upward to the left, mid abdomen, about level of umbilicus; episode was approx. 5:00-7:00 PM last night.  Stated she felt clammy and nauseated while having BM.  Reported having hard, constipated stool, followed by 3-4 loose stools that turned very watery.  Reported it was after having the diarrhea stools, she went back to BR to urinate, and without passing any stool, expelled flecks of bright red blood, followed by pink-tinge on toilet paper.  Denied that the toilet water turned bright red, but able to see the flecks of bright red in the toilet bowl.  Reported had abdominal discomfort for approx. one hour, after the bowel movements.  Stated her anal area feels irritated.  Denied continued nausea or any vomiting.  Denied fever/ chills.  Denied further rectal bleeding.  Denied dizziness or weakness.  Stated she is feeling better today, but requested appt. for eval.  Appt. given 10/16/18 @ 10:20 AM; care advice given per protocol.  Verb. Understanding.  Agreed with plan.     Reason for Disposition . MILD rectal bleeding (more than just a few drops or streaks)  Answer Assessment - Initial Assessment Questions 1. LOCATION: "Where does it hurt?"      Felt pain across the lower abdomen 2. RADIATION: "Does the pain shoot anywhere else?" (e.g., chest, back)     Radiated up to the left side of abdomen to level of umbilicus   3. ONSET: "When did the pain begin?" (e.g., minutes, hours or days ago)      5:00 PM- approx. 7:00 PM 1/20 4. SUDDEN: "Gradual or sudden onset?"    sudden 5. PATTERN "Does the pain come and go, or is it constant?"    - If constant: "Is it getting better, staying the same, or worsening?"      (Note: Constant means the pain never goes away completely; most serious pain is constant and it progresses)    - If intermittent: "How long does it last?" "Do you have pain now?"     (Note: Intermittent means the pain goes away completely between bouts)     Spasm type steady pain for about 2 hrs. 6. SEVERITY: "How bad is the pain?"  (e.g., Scale 1-10; mild, moderate, or severe)   - MILD (1-3): doesn't interfere with normal activities, abdomen soft and not tender to touch    - MODERATE (4-7): interferes with normal activities or awakens from sleep, tender to touch    - SEVERE (8-10): excruciating pain, doubled over, unable to do any normal activities      Abdominal pain last night at approx. 5:00 PM was 10/10; Denied pain at this time.  7. RECURRENT SYMPTOM: "Have you ever had this type of abdominal pain before?" If so, ask: "When was the last time?" and "What happened that time?"     yes 8. CAUSE: "What do you think is causing the abdominal pain?"     Stated she has had stomach problems and alternates between constipation and diarrhea.  9. RELIEVING/AGGRAVATING FACTORS: "What makes it better or worse?" (e.g., movement, antacids, bowel movement)     Has continued abdominal discomfort about one hour after the BM .  10. OTHER SYMPTOMS: "Has there been any vomiting, diarrhea, constipation, or urine problems?"       Had a constipated stool followed by  diarrhea stools ; flecks of blood in toilet bowl, and pink tinge on the toilet paper; had clammy feeling and nausea with the abdominal spasms and having stool     11. PREGNANCY: "Is there any chance you are pregnant?" "When was your last menstrual period?"       LMP; about 2 weeks ago.  Answer Assessment - Initial Assessment Questions 1. APPEARANCE of BLOOD: "What color is it?" "Is it passed separately, on the surface of the stool, or mixed in with the stool?"      Flecks of bright red blood in toilet bowl; denied toilet water being red 2. AMOUNT: "How much blood was passed?"      Flecks; then saw pink tinge on toilet paper.  3. FREQUENCY: "How many times  has blood been passed with the stools?"      X 1 4. ONSET: "When was the blood first seen in the stools?" (Days or weeks)      10/14/18  5. DIARRHEA: "Is there also some diarrhea?" If so, ask: "How many diarrhea stools were passed in past 24 hours?"      Had approx. 3-4 loose stool that became watery last evening 6. CONSTIPATION: "Do you have constipation?" If so, "How bad is it?"     Passed hard stool initially, then had loose stool 7. RECURRENT SYMPTOMS: "Have you had blood in your stools before?" If so, ask: "When was the last time?" and "What happened that time?"      yes 8. BLOOD THINNERS: "Do you take any blood thinners?" (e.g., Coumadin/warfarin, Pradaxa/dabigatran, aspirin)     Excedrin Migraine intermittently 9. OTHER SYMPTOMS: "Do you have any other symptoms?"  (e.g., abdominal pain, vomiting, dizziness, fever)     Abdominal spasm type pain; clammy and nauseated with passing hard  Stool; denied dizziness or weakness 10. PREGNANCY: "Is there any chance you are pregnant?" "When was your last menstrual period?"       LMP approx. 2 wks ago.  Protocols used: RECTAL BLEEDING-A-AH, ABDOMINAL PAIN - [facility]

## 2018-10-16 ENCOUNTER — Ambulatory Visit: Payer: BLUE CROSS/BLUE SHIELD | Admitting: Family Medicine

## 2018-10-16 ENCOUNTER — Other Ambulatory Visit: Payer: Self-pay | Admitting: Family Medicine

## 2018-10-16 ENCOUNTER — Encounter: Payer: Self-pay | Admitting: Family Medicine

## 2018-10-16 VITALS — BP 102/78 | HR 100 | Temp 97.8°F | Resp 16 | Ht 66.0 in | Wt 179.2 lb

## 2018-10-16 DIAGNOSIS — K921 Melena: Secondary | ICD-10-CM | POA: Diagnosis not present

## 2018-10-16 DIAGNOSIS — D72829 Elevated white blood cell count, unspecified: Secondary | ICD-10-CM

## 2018-10-16 DIAGNOSIS — R109 Unspecified abdominal pain: Secondary | ICD-10-CM | POA: Diagnosis not present

## 2018-10-16 LAB — CBC WITH DIFFERENTIAL/PLATELET
BASOS ABS: 0.1 10*3/uL (ref 0.0–0.1)
Basophils Relative: 0.9 % (ref 0.0–3.0)
Eosinophils Absolute: 0.4 10*3/uL (ref 0.0–0.7)
Eosinophils Relative: 2.4 % (ref 0.0–5.0)
HEMATOCRIT: 39.6 % (ref 36.0–46.0)
HEMOGLOBIN: 13.2 g/dL (ref 12.0–15.0)
Lymphocytes Relative: 17.7 % (ref 12.0–46.0)
Lymphs Abs: 2.7 10*3/uL (ref 0.7–4.0)
MCHC: 33.3 g/dL (ref 30.0–36.0)
MCV: 83.7 fl (ref 78.0–100.0)
MONOS PCT: 5.5 % (ref 3.0–12.0)
Monocytes Absolute: 0.8 10*3/uL (ref 0.1–1.0)
NEUTROS ABS: 11.2 10*3/uL — AB (ref 1.4–7.7)
Neutrophils Relative %: 73.5 % (ref 43.0–77.0)
PLATELETS: 266 10*3/uL (ref 150.0–400.0)
RBC: 4.73 Mil/uL (ref 3.87–5.11)
RDW: 14.8 % (ref 11.5–15.5)
WBC: 15.2 10*3/uL — AB (ref 4.0–10.5)

## 2018-10-16 LAB — COMPREHENSIVE METABOLIC PANEL
ALBUMIN: 4.1 g/dL (ref 3.5–5.2)
ALT: 13 U/L (ref 0–35)
AST: 11 U/L (ref 0–37)
Alkaline Phosphatase: 106 U/L (ref 39–117)
BILIRUBIN TOTAL: 0.4 mg/dL (ref 0.2–1.2)
BUN: 10 mg/dL (ref 6–23)
CALCIUM: 9.3 mg/dL (ref 8.4–10.5)
CO2: 26 mEq/L (ref 19–32)
CREATININE: 0.74 mg/dL (ref 0.40–1.20)
Chloride: 101 mEq/L (ref 96–112)
GFR: 85.49 mL/min (ref >60.00)
Glucose, Bld: 97 mg/dL (ref 70–99)
Potassium: 4.2 mEq/L (ref 3.5–5.1)
Sodium: 136 mEq/L (ref 135–145)
TOTAL PROTEIN: 6.2 g/dL (ref 6.0–8.3)

## 2018-10-16 MED ORDER — DICYCLOMINE HCL 10 MG/5ML PO SOLN: 10.0000 mg | Freq: Every evening | ORAL | 1 refills | Status: DC | PRN

## 2018-10-16 MED ORDER — DICYCLOMINE HCL 20 MG PO TABS: 20.0000 mg | ORAL_TABLET | Freq: Three times a day (TID) | ORAL | 1 refills | Status: DC | PRN

## 2018-10-16 NOTE — Progress Notes (Signed)
Subjective:    Patient ID: Heather [name], female    DOB: 08/04/1976, 43 y.o.   MRN: [id number]  HPI  Presents to clinic c/o bright red blood from rectum x1 episode and some red blood/pink tinge on toilet paper after wiping. Patient states prior to this episode she was having issues with constipation and stomach cramps - states this is not a new problem for her, has struggled with IBS/stomach cramps for years.   Patient states since this episode 2 days ago, she has not had any more issues with blood when wiping, and her bowel movements have been more regular.  Also states his stomach cramps have subsided.  Patient states in the past, she used to use Bentyl for stomach cramps with good success and would be interested in getting back on Bentyl.  Patient Active Problem List   Diagnosis Date Noted  . Conjunctivitis 05/23/2018  . Sleeping difficulty 10/31/2017  . Rash 06/11/2017  . Health care maintenance 10/12/2016  . History of miscarriage 10/12/2016  . Hypercholesterolemia 07/09/2016  . Migraine 07/20/2013  . Seizure disorder ([place]) 07/20/2013  . Asthma 07/20/2013   Social History   Tobacco Use  . Smoking status: Former Smoker    Packs/day: 0.50    Types: Cigarettes  . Smokeless tobacco: Never Used  . Tobacco comment: Quit mid October 2014  Substance Use Topics  . Alcohol use: No    Alcohol/week: 0.0 standard drinks   Family History  Problem Relation Age of Onset  . Hypertension Mother   . Diabetes Mother   . Arthritis Mother   . Heart disease Mother   . Breast cancer Neg Hx     Review of Systems  Constitutional: Negative for chills, fatigue and fever.  HENT: Negative for congestion, ear pain, sinus pain and sore throat.   Eyes: Negative.   Respiratory: Negative for cough, shortness of breath and wheezing.   Cardiovascular: Negative for chest pain, palpitations and leg swelling.  Gastrointestinal: Negative for nausea and vomiting. +stomach cramps, constipation,  alternating with diarrhea. Genitourinary: Negative for dysuria, frequency and urgency.  Musculoskeletal: Negative for arthralgias and myalgias.  Skin: Negative for color change, pallor and rash.  Neurological: Negative for syncope, light-headedness and headaches.  Psychiatric/Behavioral: The patient is not nervous/anxious.  '    Objective:   Physical Exam  Constitutional: She appears well-developed and well-nourished. No distress.  HENT:  Head: Normocephalic and atraumatic.  Eyes: Pupils are equal, round, and reactive to light. EOM are normal. No scleral icterus.  Neck: Normal range of motion. Neck supple. No tracheal deviation present.  Cardiovascular: Normal rate, regular rhythm and normal heart sounds.  Pulmonary/Chest: Effort normal and breath sounds normal. No respiratory distress. She has no wheezes. She has no rales.  Abdominal: Soft. Bowel sounds are normal. There is no tenderness. Patient declines rectal exam. Neurological: [name] and oriented to person, place, and time.  Gait normal  Skin: Skin is warm and dry. No pallor.  Psychiatric: She has a normal mood and affect. Her behavior is normal.    Nursing note and vitals reviewed.   Vitals:   10/16/18 1018  BP: 102/78  Pulse: 100  Resp: 16  Temp: 97.8 F (36.6 C)  SpO2: 99%      Assessment & Plan:   Stomach cramps/blood in stool - due to patient's long history of stomach cramping and success with dicyclomine in the past, we will send in refill of this medication.  Patient declined rectal exam  in clinic, but advised that bright red blood often is from a hemorrhoid or a small tear in the skin surrounding the rectum.  We will get CBC and CMP today in clinic to evaluate for any anemia, elevated white cells, electrolyte abnormalities, liver kidney functions.  Patient also given fecal occult blood testing kit to do samples at home and return to lab.  Patient will keep regularly scheduled follow-up with PCP as planned  in February 2020.  Advised to return to clinic sooner if any issues arise or if current symptoms persist or worsen

## 2018-10-17 NOTE — Addendum Note (Signed)
Addended by: Philis Nettle on: 10/17/2018 08:01 AM   Modules accepted: Orders

## 2018-10-23 ENCOUNTER — Ambulatory Visit: Payer: BLUE CROSS/BLUE SHIELD | Admitting: Family Medicine

## 2018-10-23 ENCOUNTER — Other Ambulatory Visit: Payer: BLUE CROSS/BLUE SHIELD

## 2018-10-23 ENCOUNTER — Encounter: Payer: Self-pay | Admitting: Family Medicine

## 2018-10-23 VITALS — BP 124/88 | HR 95 | Temp 97.9°F | Resp 16 | Ht 66.0 in | Wt 178.4 lb

## 2018-10-23 DIAGNOSIS — J4541 Moderate persistent asthma with (acute) exacerbation: Secondary | ICD-10-CM

## 2018-10-23 MED ORDER — DOXYCYCLINE HYCLATE 100 MG PO TABS: 100.0000 mg | ORAL_TABLET | Freq: Two times a day (BID) | ORAL | 0 refills | Status: DC

## 2018-10-23 MED ORDER — METHYLPREDNISOLONE ACETATE 80 MG/ML IJ SUSP
80.0000 mg | Freq: Once | INTRAMUSCULAR | Status: AC
  Administered 2018-10-23: 80 mg via INTRAMUSCULAR

## 2018-10-23 MED ORDER — HYDROCOD POLST-CPM POLST ER 10-8 MG/5ML PO SUER: 5.0000 mL | Freq: Two times a day (BID) | ORAL | 0 refills | Status: DC | PRN

## 2018-10-23 MED ORDER — ALBUTEROL SULFATE (2.5 MG/3ML) 0.083% IN NEBU
2.5000 mg | INHALATION_SOLUTION | Freq: Once | RESPIRATORY_TRACT | Status: AC
  Administered 2018-10-23: 2.5 mg via RESPIRATORY_TRACT

## 2018-10-23 MED ORDER — PREDNISONE 10 MG (21) PO TBPK: ORAL_TABLET | ORAL | 0 refills | Status: DC

## 2018-10-23 MED ORDER — IPRATROPIUM-ALBUTEROL 0.5-2.5 (3) MG/3ML IN SOLN: 3.0000 mL | RESPIRATORY_TRACT | 3 refills | Status: DC | PRN

## 2018-10-23 MED ORDER — IPRATROPIUM BROMIDE 0.02 % IN SOLN
0.5000 mg | Freq: Once | RESPIRATORY_TRACT | Status: AC
  Administered 2018-10-23: 0.5 mg via RESPIRATORY_TRACT

## 2018-10-23 NOTE — Progress Notes (Signed)
Subjective:    Patient ID: Heather [name], female    DOB: [birth year], 43 y.o.   MRN: [id number]  HPI   Patient presents to clinic complaining of cough, chest congestion, increased wheezing and shortness of breath has been building up over the last week.  Patient has been trying to keep up with nebulizer treatments at home to help reduce shortness of breath, nebulizer treatment will help for short time, but shortness of breath and wheezing tends to come back.  Denies fever or chills.  States sometimes she will cough up white or tan phlegm with cough other times it is a dry cough.  States she has had similar illness like this happen to her in the past winters, often will need steroid course to reduce wheezing symptoms.  Denies chest pain.  Denies nausea/vomiting or diarrhea.  Patient Active Problem List   Diagnosis Date Noted  . Conjunctivitis 05/23/2018  . Sleeping difficulty 10/31/2017  . Rash 06/11/2017  . Health care maintenance 10/12/2016  . History of miscarriage 10/12/2016  . Hypercholesterolemia 07/09/2016  . Migraine 07/20/2013  . Seizure disorder ([place]) 07/20/2013  . Asthma 07/20/2013   Social History   Tobacco Use  . Smoking status: Former Smoker    Packs/day: 0.50    Types: Cigarettes  . Smokeless tobacco: Never Used  . Tobacco comment: Quit mid October 2014  Substance Use Topics  . Alcohol use: No    Alcohol/week: 0.0 standard drinks   Review of Systems   Constitutional: Negative for chills, fatigue and fever.  HENT: Negative for congestion, ear pain, sinus pain and sore throat.   Eyes: Negative.   Respiratory: +cough, chest congestion, shortness of breath and wheezing.   Cardiovascular: Negative for chest pain, palpitations and leg swelling.  Gastrointestinal: Negative for abdominal pain, diarrhea, nausea and vomiting.  Genitourinary: Negative for dysuria, frequency and urgency.  Musculoskeletal: Negative for arthralgias and myalgias.  Skin: Negative for color  change, pallor and rash.  Neurological: Negative for syncope, light-headedness and headaches.  Psychiatric/Behavioral: The patient is not nervous/anxious.       Objective:   Physical Exam Vitals signs and nursing note reviewed.  Constitutional:      General: She is not in acute distress.    Appearance: She is not toxic-appearing.  HENT:     Head: Normocephalic and atraumatic.     Right Ear: Tympanic membrane and ear canal normal.     Left Ear: Tympanic membrane and ear canal normal.     Nose: Nose normal.     Mouth/Throat:     Mouth: Mucous membranes are moist.  Eyes:     General: No scleral icterus.    Extraocular Movements: Extraocular movements intact.     Conjunctiva/sclera: Conjunctivae normal.  Cardiovascular:     Rate and Rhythm: Normal rate and regular rhythm.  Pulmonary:     Effort: Pulmonary effort is normal.     Breath sounds: No stridor. Wheezing and rhonchi present. No rales.  Skin:    General: Skin is warm and dry.     Coloration: Skin is not pale.  Neurological:     Mental Status: [name] and oriented to person, place, and time.  Psychiatric:        Mood and Affect: Mood normal.        Behavior: Behavior normal.    Vitals:   10/23/18 1109  BP: 124/88  Pulse: 95  Resp: 16  Temp: 97.9 F (36.6 C)  SpO2: 98%  Assessment & Plan:   Moderate persistent asthma with exacerbation-DuoNeb given to patient in clinic.  Methylprednisolone IM 80 mg given in clinic x1 as well.  Patient does feel that her wheezing is improved after breathing treatment.  She will take oral steroid taper, refill of DuoNeb solution sent in for patient to use with her nebulizer at home.  She will also take course of doxycycline twice daily for 10 days to cover respiratory infection and use Tussionex cough syrup as needed to reduce cough symptoms.  Patient advised that Tussionex can cause drowsiness, so do not take this prior to driving.  Advised a good nondrowsy cough medication  option is over-the-counter Mucinex.  Offered follow-up in 1 week for recheck to be sure she is improving, patient declines and states she will let us know if she is not getting better.  Otherwise she will keep her already planned follow-up in 11/19/2018.

## 2018-11-19 ENCOUNTER — Ambulatory Visit (INDEPENDENT_AMBULATORY_CARE_PROVIDER_SITE_OTHER): Payer: BLUE CROSS/BLUE SHIELD | Admitting: Internal Medicine

## 2018-11-19 ENCOUNTER — Encounter: Payer: Self-pay | Admitting: Internal Medicine

## 2018-11-19 VITALS — BP 130/70 | HR 94 | Temp 98.2°F | Resp 16 | Wt 175.2 lb

## 2018-11-19 DIAGNOSIS — Z1239 Encounter for other screening for malignant neoplasm of breast: Secondary | ICD-10-CM

## 2018-11-19 DIAGNOSIS — R1084 Generalized abdominal pain: Secondary | ICD-10-CM

## 2018-11-19 DIAGNOSIS — Z1231 Encounter for screening mammogram for malignant neoplasm of breast: Secondary | ICD-10-CM | POA: Diagnosis not present

## 2018-11-19 DIAGNOSIS — E7800 Pure hypercholesterolemia, unspecified: Secondary | ICD-10-CM

## 2018-11-19 DIAGNOSIS — R109 Unspecified abdominal pain: Secondary | ICD-10-CM | POA: Insufficient documentation

## 2018-11-19 DIAGNOSIS — R030 Elevated blood-pressure reading, without diagnosis of hypertension: Secondary | ICD-10-CM

## 2018-11-19 DIAGNOSIS — G40909 Epilepsy, unspecified, not intractable, without status epilepticus: Secondary | ICD-10-CM

## 2018-11-19 DIAGNOSIS — D72829 Elevated white blood cell count, unspecified: Secondary | ICD-10-CM

## 2018-11-19 DIAGNOSIS — R0981 Nasal congestion: Secondary | ICD-10-CM

## 2018-11-19 DIAGNOSIS — J4520 Mild intermittent asthma, uncomplicated: Secondary | ICD-10-CM

## 2018-11-19 DIAGNOSIS — G43809 Other migraine, not intractable, without status migrainosus: Secondary | ICD-10-CM

## 2018-11-19 LAB — CBC WITH DIFFERENTIAL/PLATELET
Basophils Absolute: 0.1 10*3/uL (ref 0.0–0.1)
Basophils Relative: 0.4 % (ref 0.0–3.0)
EOS PCT: 1.9 % (ref 0.0–5.0)
Eosinophils Absolute: 0.3 10*3/uL (ref 0.0–0.7)
HCT: 43.4 % (ref 36.0–46.0)
Hemoglobin: 14.4 g/dL (ref 12.0–15.0)
LYMPHS ABS: 3.2 10*3/uL (ref 0.7–4.0)
Lymphocytes Relative: 18.1 % (ref 12.0–46.0)
MCHC: 33.3 g/dL (ref 30.0–36.0)
MCV: 84.2 fl (ref 78.0–100.0)
MONO ABS: 1 10*3/uL (ref 0.1–1.0)
MONOS PCT: 5.7 % (ref 3.0–12.0)
NEUTROS PCT: 73.9 % (ref 43.0–77.0)
Neutro Abs: 13.1 10*3/uL — ABNORMAL HIGH (ref 1.4–7.7)
PLATELETS: 303 10*3/uL (ref 150.0–400.0)
RBC: 5.15 Mil/uL — AB (ref 3.87–5.11)
RDW: 14.8 % (ref 11.5–15.5)
WBC: 17.8 10*3/uL — AB (ref 4.0–10.5)

## 2018-11-19 LAB — HEPATIC FUNCTION PANEL
ALK PHOS: 113 U/L (ref 39–117)
ALT: 24 U/L (ref 0–35)
AST: 15 U/L (ref 0–37)
Albumin: 4.2 g/dL (ref 3.5–5.2)
Bilirubin, Direct: 0.1 mg/dL (ref 0.0–0.3)
Total Bilirubin: 0.4 mg/dL (ref 0.2–1.2)
Total Protein: 6.8 g/dL (ref 6.0–8.3)

## 2018-11-19 LAB — BASIC METABOLIC PANEL
BUN: 11 mg/dL (ref 6–23)
CALCIUM: 9.7 mg/dL (ref 8.4–10.5)
CHLORIDE: 104 meq/L (ref 96–112)
CO2: 26 meq/L (ref 19–32)
CREATININE: 1.04 mg/dL (ref 0.40–1.20)
GFR: 57.7 mL/min — ABNORMAL LOW (ref >60.00)
GLUCOSE: 94 mg/dL (ref 70–99)
Potassium: 4.2 mEq/L (ref 3.5–5.1)
Sodium: 138 mEq/L (ref 135–145)

## 2018-11-19 LAB — AMYLASE: Amylase: 45 U/L (ref 27–131)

## 2018-11-19 LAB — LIPASE: Lipase: 14 U/L (ref 11.0–59.0)

## 2018-11-19 MED ORDER — MAGNESIUM OXIDE 400 MG PO TABS: 400.0000 mg | ORAL_TABLET | Freq: Every day | ORAL | 1 refills | Status: DC

## 2018-11-19 NOTE — Patient Instructions (Signed)
Continue saline flushes  nasacort nasal spray - 2 sprays each nostril one time per day.  Do this in the evening.    Robitussin - will help with congestion.

## 2018-11-19 NOTE — Progress Notes (Signed)
Patient ID: ARLINE KETTER, female   DOB: 1976-02-17, 43 y.o.   MRN: [id number]   Subjective:    Patient ID: [name], female    DOB: 1976-09-15, 43 y.o.   MRN: [id number]  HPI  Patient here for a scheduled follow up.  She was evaluated 10/23/18 for cough and congestion.  Note reviewed.  Placed on doxycycline and prednisone.  Cough and congestion improved.  Was also seen 10/16/18 with abdominal pain and cramping and notice of BRBPR.  On questioning she reports that prior to the 10/16/18 visit, she had a migraine.  Lasted 6 days.  Took increased amount of otc alleve, excedrin, etc.  Developed stomach pain.  No vomiting.  Some bowel change prior, but no significant constipation.  Noticed blood in her stool and some blood after wiping.  No further bleeding.  Had another migraine - started several days ago.  Again took an increased amount of excedrin and alleve.  Developed increased abdominal pain.  No vomiting.  No increased diarrhea.  Does report soft stool.  The increased pain is better.  Still with stomach soreness.  Head is better.  Stomach feels better.  Eating.  Ate this am.  No nausea or vomiting.  No urine change.  Overall feels better.  No fever.  In reviewing, it appears she is due colonoscopy.  Had recommended 5 year f/u after last.  (overdue).  She had seen neurology previously for her migraines.  Better for a while.  States she will have a headache at least 1x/week, but not a "bad migraine".   Migraine headache - recent episode and had the episode several weeks ago.     Past Medical History:  Diagnosis Date  . Asthma   . Depression    bipolar  . Fainting spell    H/O  . H/O cardiac arrhythmia   . Migraines   . Seizures ([place])    Past Surgical History:  Procedure Laterality Date  . TUBAL LIGATION Bilateral 09/2002   Family History  Problem Relation Age of Onset  . Hypertension Mother   . Diabetes Mother   . Arthritis Mother   . Heart disease Mother   . Breast cancer Neg Hx     Social History   Socioeconomic History  . Marital status: Married    Spouse name: Not on file  . Number of children: Not on file  . Years of education: Not on file  . Highest education level: Not on file  Occupational History  . Not on file  Social Needs  . Financial resource strain: Not on file  . Food insecurity:    Worry: Not on file    Inability: Not on file  . Transportation needs:    Medical: Not on file    Non-medical: Not on file  Tobacco Use  . Smoking status: Former Smoker    Packs/day: 0.50    Types: Cigarettes  . Smokeless tobacco: Never Used  . Tobacco comment: Quit mid October 2014  Substance and Sexual Activity  . Alcohol use: No    Alcohol/week: 0.0 standard drinks  . Drug use: No  . Sexual activity: Not on file  Lifestyle  . Physical activity:    Days per week: Not on file    Minutes per session: Not on file  . Stress: Not on file  Relationships  . Social connections:    Talks on phone: Not on file    Gets together: Not on file  Attends religious service: Not on file    Active member of club or organization: Not on file    Attends meetings of clubs or organizations: Not on file    Relationship status: Not on file  Other Topics Concern  . Not on file  Social History Narrative  . Not on file    Outpatient Encounter Medications as of 11/19/2018  Medication Sig  . albuterol (PROVENTIL HFA;VENTOLIN HFA) 108 (90 Base) MCG/ACT inhaler Inhale 1-2 puffs into the lungs every 4 (four) hours as needed for wheezing or shortness of breath.  . budesonide-formoterol (SYMBICORT) 160-4.5 MCG/ACT inhaler Inhale 2 puffs into the lungs 2 (two) times daily. Rinse mouth  . dicyclomine (BENTYL) 10 MG/5ML syrup Take 5 mLs (10 mg total) by mouth at bedtime as needed.  . dicyclomine (BENTYL) 20 MG tablet Take 1 tablet (20 mg total) by mouth every 8 (eight) hours as needed for spasms.  Marland Kitchen ipratropium-albuterol (DUONEB) 0.5-2.5 (3) MG/3ML SOLN Take 3 mLs by nebulization  every 4 (four) hours as needed.  . magnesium oxide (MAG-OX) 400 MG tablet Take 1 tablet (400 mg total) by mouth daily.  . [DISCONTINUED] chlorpheniramine-HYDROcodone (TUSSIONEX PENNKINETIC ER) 10-8 MG/5ML SUER Take 5 mLs by mouth every 12 (twelve) hours as needed.  . [DISCONTINUED] doxycycline (VIBRA-TABS) 100 MG tablet Take 1 tablet (100 mg total) by mouth 2 (two) times daily.  . [DISCONTINUED] predniSONE (STERAPRED UNI-PAK 21 TAB) 10 MG (21) TBPK tablet Take according to pack instructions   No facility-administered encounter medications on file as of 11/19/2018.     Review of Systems  Constitutional: Negative for appetite change and fever.  HENT: Positive for congestion. Negative for sinus pressure.   Respiratory: Negative for chest tightness and shortness of breath.        Cough and chest congestion better.    Cardiovascular: Negative for chest pain, palpitations and leg swelling.  Gastrointestinal: Positive for abdominal pain. Negative for vomiting.       No significant diarrhea.  Previous notice of BRBPR.    Genitourinary: Negative for difficulty urinating and dysuria.  Musculoskeletal: Negative for joint swelling and myalgias.  Skin: Negative for color change and rash.  Neurological: Positive for headaches. Negative for dizziness and light-headedness.  Psychiatric/Behavioral: Negative for agitation and dysphoric mood.       Objective:    Physical Exam Constitutional:      General: She is not in acute distress.    Appearance: Normal appearance.  HENT:     Nose: Nose normal. No congestion.     Mouth/Throat:     Pharynx: No oropharyngeal exudate or posterior oropharyngeal erythema.  Neck:     Musculoskeletal: Neck supple. No muscular tenderness.     Thyroid: No thyromegaly.  Cardiovascular:     Rate and Rhythm: Normal rate and regular rhythm.  Pulmonary:     Effort: No respiratory distress.     Breath sounds: Normal breath sounds. No wheezing.  Abdominal:     General:  Bowel sounds are normal.     Palpations: Abdomen is soft.     Comments: Some increased tenderness - epigastric region and around umbilicus.    Musculoskeletal:        General: No swelling or tenderness.  Lymphadenopathy:     Cervical: No cervical adenopathy.  Skin:    Findings: No erythema or rash.  Neurological:     Mental Status: [name].  Psychiatric:        Mood and Affect: Mood normal.  Behavior: Behavior normal.     BP 130/70   Pulse 94   Temp 98.2 F (36.8 C) (Oral)   Resp 16   Wt 175 lb 3.2 oz (79.5 kg)   SpO2 97%   BMI 28.28 kg/m  Wt Readings from Last 3 Encounters:  11/19/18 175 lb 3.2 oz (79.5 kg)  10/23/18 178 lb 6.4 oz (80.9 kg)  10/16/18 179 lb 3.2 oz (81.3 kg)     Lab Results  Component Value Date   WBC 17.8 (H) 11/19/2018   HGB 14.4 11/19/2018   HCT 43.4 11/19/2018   PLT 303.0 11/19/2018   GLUCOSE 94 11/19/2018   CHOL 214 (H) 11/19/2017   TRIG 181.0 (H) 11/19/2017   HDL 37.20 (L) 11/19/2017   LDLCALC 141 (H) 11/19/2017   ALT 24 11/19/2018   AST 15 11/19/2018   NA 138 11/19/2018   K 4.2 11/19/2018   CL 104 11/19/2018   CREATININE 1.04 11/19/2018   BUN 11 11/19/2018   CO2 26 11/19/2018   TSH 1.34 11/19/2017    Mm Digital Screening Bilateral  Result Date: 11/16/2017 CLINICAL DATA:  Screening. EXAM: DIGITAL SCREENING BILATERAL MAMMOGRAM WITH CAD COMPARISON:  None. ACR Breast Density Category b: There are scattered areas of fibroglandular density. FINDINGS: There are no findings suspicious for malignancy. Images were processed with CAD. IMPRESSION: No mammographic evidence of malignancy. A result letter of this screening mammogram will be mailed directly to the patient. RECOMMENDATION: Screening mammogram in one year. (Code:SM-B-01Y) BI-RADS CATEGORY  1: Negative. Electronically Signed   By: Curlene Dolphin M.D.   On: 11/16/2017 11:09       Assessment & Plan:   Problem List Items Addressed This Visit    Abdominal pain    Has had a  couple of episodes now of increased abdominal pain, bowel change and previous BRBPR.  Discussed the need to stop the increased amount of antiinflammatories and otc pain medication.  Pepcid daily.  Persistent soreness.  Check cbc, lipase, liver panel and metabolic panel.  Also obtain CT abdomen and pelvis.  Also, in reviewing, she is past due f/u colonoscopy.  Get her back in with GI.  May need EGD as well.  Symptoms improved currently.  W/up planned as above.        Relevant Orders   CT Abdomen Pelvis W Contrast   CBC with Differential/Platelet (Completed)   Hepatic function panel (Completed)   Basic metabolic panel (Completed)   Lipase (Completed)   Amylase (Completed)   Ambulatory referral to Gastroenterology   Asthma    Recent congestion and cough as outlined.  Improved.  No sob.  Follow.        Hypercholesterolemia    Low cholesterol diet and exercise.  Follow lipid panel.       Leukocytosis    Repeat cbc today.       Migraine    Headaches as outlined.  Better today.  Discussed the need for her to stop increased otc antiinflammatories.  Discussed rebound headaches.  Has seen neurology previously.  Discussed the need for f/u with neurology.  Start her on mag oxide 400mg  q day for prevention.  Had previous MRI in her w/up.  Feels these are similar headaches.  No new symptoms.  Hold on f/u scan.        Relevant Orders   Ambulatory referral to Neurology   Seizure disorder [facility])    On no medication.  No recent seizures.  Previously w/up by neurology.  Other Visit Diagnoses    Elevated blood pressure reading    -  Primary   Have her spot check her pressure.  Follow up soon.     Breast cancer screening       Visit for screening mammogram       Relevant Orders   MM 3D SCREEN BREAST BILATERAL   Nasal congestion       Better.  Some persistent symptoms.  Saline nasal spray and nasacort as directed.  Robitussin.  Follow.        Einar Pheasant, MD

## 2018-11-20 ENCOUNTER — Ambulatory Visit: Payer: Self-pay | Admitting: *Deleted

## 2018-11-20 ENCOUNTER — Telehealth: Payer: Self-pay | Admitting: Internal Medicine

## 2018-11-20 MED ORDER — BUTALBITAL-APAP-CAFFEINE 50-325-40 MG PO TABS: 1.0000 | ORAL_TABLET | Freq: Two times a day (BID) | ORAL | 0 refills | Status: DC | PRN

## 2018-11-20 NOTE — Telephone Encounter (Signed)
rx sent in for fioricet #10 with no refills.  Needs to remain off her NSAIDS.

## 2018-11-20 NOTE — Telephone Encounter (Signed)
Pt called with readings of her b/p for her provider. Her b/p right now is 157/104 and 150/99. She was not able to get a pulse.  She is c/o of headache. Denies blurred vision, shortness of breath, weakness, chest pain or dizziness.  Has not taken any medication for her headache.  Appointment scheduled per protocol. She is also scheduled for blood work Architectural technologist. Advised to call 911 if b/p goes higher and symptoms of chest pain, blurred vision, nausea, dizziness, shortness of breath. Pt voiced understanding. Routing to flow at [employer].   Reason for Disposition . Systolic BP  >= 098 OR Diastolic >= 119  Answer Assessment - Initial Assessment Questions 1. BLOOD PRESSURE: "What is the blood pressure?" "Did you take at least two measurements 5 minutes apart?"     157/104 and 150/99 2. ONSET: "When did you take your blood pressure?"     Just now 3. HOW: "How did you obtain the blood pressure?" (e.g., visiting nurse, automatic home BP monitor)     Automatic home BP monitor 4. HISTORY: "Do you have a history of high blood pressure?"     no 5. MEDICATIONS: "Are you taking any medications for blood pressure?" "Have you missed any doses recently?"     Not on medication 6. OTHER SYMPTOMS: "Do you have any symptoms?" (e.g., headache, chest pain, blurred vision, difficulty breathing, weakness)     headache 7. PREGNANCY: "Is there any chance you are pregnant?" "When was your last menstrual period?"     No, LMP 11/01/2018  Protocols used: HIGH BLOOD PRESSURE-A-AH

## 2018-11-20 NOTE — Telephone Encounter (Signed)
Copied from [place] [id number]. Topic: Quick Communication - See Telephone Encounter >> Nov 20, 2018  9:22 AM Antonieta Iba C wrote: CRM for notification. See Telephone encounter for: 11/20/18.  Pt called in to ask if provider could send something in for her migraine? Pt was seen yesterday and was given magnesium oxide (MAG-OX) 400 MG tablet for preventative.   Pharmacy: [facility] [address] [place] [phone] (Phone) [phone] (Fax)

## 2018-11-20 NOTE — Telephone Encounter (Signed)
Pt needing something for migraine. She was seen yesterday

## 2018-11-20 NOTE — Telephone Encounter (Signed)
She was feeling better yesterday.  Sent in mag oxide to help with prevention of headaches.  Is she feeling worse today?  Any new symptoms.  See lab result note if not already notified.  I can give her a few fioricet to have if needed, but she cannot take an excessive amount and cannot take this with increased otc medications.  Will only give a few until neurology sees her.

## 2018-11-20 NOTE — Telephone Encounter (Signed)
Patient is not having any new symptoms. Her head ache is worse but nothing else. Advised message below. Pt would like fioricet until neurology sees her. Advised could not take with OTC anti inflammatories.

## 2018-11-21 ENCOUNTER — Encounter: Payer: Self-pay | Admitting: Internal Medicine

## 2018-11-21 ENCOUNTER — Encounter: Payer: Self-pay | Admitting: Family Medicine

## 2018-11-21 ENCOUNTER — Ambulatory Visit: Payer: BLUE CROSS/BLUE SHIELD | Admitting: Family Medicine

## 2018-11-21 ENCOUNTER — Other Ambulatory Visit: Payer: BLUE CROSS/BLUE SHIELD

## 2018-11-21 VITALS — BP 118/84 | HR 98 | Temp 98.1°F | Resp 16 | Ht 66.0 in | Wt 174.2 lb

## 2018-11-21 DIAGNOSIS — D72829 Elevated white blood cell count, unspecified: Secondary | ICD-10-CM | POA: Diagnosis not present

## 2018-11-21 DIAGNOSIS — J4520 Mild intermittent asthma, uncomplicated: Secondary | ICD-10-CM

## 2018-11-21 DIAGNOSIS — G43809 Other migraine, not intractable, without status migrainosus: Secondary | ICD-10-CM | POA: Diagnosis not present

## 2018-11-21 DIAGNOSIS — R030 Elevated blood-pressure reading, without diagnosis of hypertension: Secondary | ICD-10-CM

## 2018-11-21 DIAGNOSIS — D472 Monoclonal gammopathy: Secondary | ICD-10-CM | POA: Insufficient documentation

## 2018-11-21 LAB — CBC
HCT: 41.5 % (ref 36.0–46.0)
Hemoglobin: 13.8 g/dL (ref 12.0–15.0)
MCHC: 33.3 g/dL (ref 30.0–36.0)
MCV: 84.3 fl (ref 78.0–100.0)
PLATELETS: 283 10*3/uL (ref 150.0–400.0)
RBC: 4.92 Mil/uL (ref 3.87–5.11)
RDW: 14.7 % (ref 11.5–15.5)
WBC: 14.9 10*3/uL — ABNORMAL HIGH (ref 4.0–10.5)

## 2018-11-21 NOTE — Assessment & Plan Note (Signed)
Low cholesterol diet and exercise.  Follow lipid panel.   

## 2018-11-21 NOTE — Assessment & Plan Note (Signed)
Recent congestion and cough as outlined.  Improved.  No sob.  Follow.

## 2018-11-21 NOTE — Assessment & Plan Note (Addendum)
Headaches as outlined.  Better today.  Discussed the need for her to stop increased otc antiinflammatories.  Discussed rebound headaches.  Has seen neurology previously.  Discussed the need for f/u with neurology.  Start her on mag oxide 400mg  q day for prevention.  Had previous MRI in her w/up.  Feels these are similar headaches.  No new symptoms.  Hold on f/u scan.

## 2018-11-21 NOTE — Assessment & Plan Note (Signed)
On no medication.  No recent seizures.  Previously w/up by neurology.

## 2018-11-21 NOTE — Progress Notes (Signed)
Subjective:    Patient ID: Heather [name], female    DOB: 08/06/1976, 43 y.o.   MRN: [id number]  HPI  Patient presents in clinic complaining of elevated BP and migraine headache.  Patient states she had a splitting headache yesterday, and when checked her blood pressure was in the 150s over 90s range.  States Fioricet was called in for her, and after taking this migraine headache resolved in a few hours.  As her headache pain decreased, she monitored her blood pressure and it slowly came down to the 110s over 70s to 120s over 80s range.  Patient also noticed that after using her albuterol treatment, BP was slightly more elevated.  Kept a log of BP readings after using albuterol and it would go from the 110s over 70s range up to 120s over 80s range.  Patient uses a breathing treatment usually once per day.  She does take Symbicort daily and carries her albuterol inhaler in her purse when she is out of the home.  Patient has tried different allergy type medications including Claritin, Allegra, Zyrtec and Singulair, but none seem to make a difference in her asthma symptoms.   Patient Active Problem List   Diagnosis Date Noted  . Leukocytosis 11/21/2018  . Abdominal pain 11/19/2018  . Conjunctivitis 05/23/2018  . Sleeping difficulty 10/31/2017  . Rash 06/11/2017  . Health care maintenance 10/12/2016  . History of miscarriage 10/12/2016  . Hypercholesterolemia 07/09/2016  . Migraine 07/20/2013  . Seizure disorder ([place]) 07/20/2013  . Asthma 07/20/2013  . Alternating constipation and diarrhea 07/20/2013   Social History   Tobacco Use  . Smoking status: Former Smoker    Packs/day: 0.50    Types: Cigarettes  . Smokeless tobacco: Never Used  . Tobacco comment: Quit mid October 2014  Substance Use Topics  . Alcohol use: No    Alcohol/week: 0.0 standard drinks   Review of Systems   Constitutional: Negative for chills, fatigue and fever.  HENT: Negative for congestion, ear pain, sinus  pain and sore throat.   Eyes: Negative.   Respiratory: Negative for cough, shortness of breath and wheezing.   Cardiovascular: Negative for chest pain, palpitations and leg swelling. +elevated BP Gastrointestinal: Negative for abdominal pain, diarrhea, nausea and vomiting.  Genitourinary: Negative for dysuria, frequency and urgency.  Musculoskeletal: Negative for arthralgias and myalgias.  Skin: Negative for color change, pallor and rash.  Neurological: Negative for syncope, light-headedness. +migraine HA Psychiatric/Behavioral: The patient is not nervous/anxious.       Objective:   Physical Exam  Constitutional: She appears well-developed and well-nourished. No distress.  HENT:  Head: Normocephalic and atraumatic.  Eyes: Pupils are equal, round, and reactive to light. EOM are normal. No scleral icterus.  Neck: Normal range of motion. Neck supple. No tracheal deviation present.  Cardiovascular: Normal rate, regular rhythm and normal heart sounds.  Pulmonary/Chest: Effort normal and breath sounds normal. No respiratory distress. She has no wheezes. She has no rales.  Abdominal: Soft. Bowel sounds are normal. There is no tenderness.  Neurological: [name] and oriented to person, place, and time.  Gait normal  Skin: Skin is warm and dry. No pallor.  Psychiatric: She has a normal mood and affect. Her behavior is normal. Thought content normal.  Nursing note and vitals reviewed.   BP Readings from Last 3 Encounters:  11/21/18 118/84  11/19/18 130/70  10/23/18 124/88   Today's Vitals   11/21/18 1339  BP: 118/84  Pulse: 98  Resp:  16  Temp: 98.1 F (36.7 C)  TempSrc: Oral  SpO2: 98%  Weight: 174 lb 3.2 oz (79 kg)  Height: 5\' 6"  (1.676 m)   Body mass index is 28.12 kg/m.     Assessment & Plan:    A total of 25  minutes were spent face-to-face with the patient during this encounter and over half of that time was spent on counseling and coordination of care. The  patient was counseled on asthma medications, how pain can affect blood pressure.  Mild intermittent asthma-patient will continue Symbicort twice daily and albuterol inhaler as needed.  She will use continue using nebulizer treatments at home.  Migraine headache- migraine breakthrough headache treated with Fioricet.  Advised to keep up good water intake, get proper amounts of rest to help avoid migraine headache triggers in the future.  Elevated BP without diagnosis of hypertension- suspect patient's elevated BP readings were related to migraine headache pain.  Once a migraine resolved, blood pressures returned back to normal range.  Follow-up CBC will be drawn today to recheck leukocytosis seen on previous CBC.  Patient will otherwise keep regularly scheduled follow-up with PCP as planned.  She is aware she can return to clinic sooner if any issues arise.

## 2018-11-21 NOTE — Assessment & Plan Note (Signed)
Repeat cbc today 

## 2018-11-21 NOTE — Assessment & Plan Note (Signed)
Has had a couple of episodes now of increased abdominal pain, bowel change and previous BRBPR.  Discussed the need to stop the increased amount of antiinflammatories and otc pain medication.  Pepcid daily.  Persistent soreness.  Check cbc, lipase, liver panel and metabolic panel.  Also obtain CT abdomen and pelvis.  Also, in reviewing, she is past due f/u colonoscopy.  Get her back in with GI.  May need EGD as well.  Symptoms improved currently.  W/up planned as above.

## 2018-11-22 ENCOUNTER — Encounter: Payer: Self-pay | Admitting: Internal Medicine

## 2018-11-25 ENCOUNTER — Other Ambulatory Visit: Payer: Self-pay

## 2018-11-25 NOTE — Telephone Encounter (Signed)
Spoke with pt to let her know that referrals and orders for CT scan has been placed. Gave numbers to pt to schedule the appointments. Advised pt to let me know when she has CT scheduled and we can schedule to have kidney function checked prior.

## 2018-11-26 ENCOUNTER — Encounter: Payer: Self-pay | Admitting: *Deleted

## 2018-11-26 ENCOUNTER — Telehealth: Payer: Self-pay

## 2018-11-26 NOTE — Telephone Encounter (Signed)
Copied from [place] [id number]. Topic: General - Other >> Nov 26, 2018  2:10 PM Stovall, Shana A wrote: Reason for CRM: Pt called in and stated that CT scan is sch'd for Tues 2/10 at 2pm at [facility].  She is suppose to have blood drawn to recheck kidney levels before she had the CT done.  She also wanted to know if office had to contrast for CT so she did not have to go to [place] to pick it up?   Please advise Best number  [phone]

## 2018-11-26 NOTE — Telephone Encounter (Signed)
Called patient to let her know that we do not have the contrast here and that we can schedule her labs for one day this week. Left message for her to call back

## 2018-11-27 ENCOUNTER — Telehealth: Payer: Self-pay | Admitting: Radiology

## 2018-11-27 ENCOUNTER — Other Ambulatory Visit (INDEPENDENT_AMBULATORY_CARE_PROVIDER_SITE_OTHER): Payer: BLUE CROSS/BLUE SHIELD

## 2018-11-27 DIAGNOSIS — D72829 Elevated white blood cell count, unspecified: Secondary | ICD-10-CM

## 2018-11-27 DIAGNOSIS — R7989 Other specified abnormal findings of blood chemistry: Secondary | ICD-10-CM

## 2018-11-27 LAB — BASIC METABOLIC PANEL
BUN: 9 mg/dL (ref 6–23)
CO2: 23 meq/L (ref 19–32)
Calcium: 8.8 mg/dL (ref 8.4–10.5)
Chloride: 105 mEq/L (ref 96–112)
Creatinine, Ser: 0.76 mg/dL (ref 0.40–1.20)
GFR: 82.85 mL/min (ref >60.00)
Glucose, Bld: 115 mg/dL — ABNORMAL HIGH (ref 70–99)
Potassium: 3.3 mEq/L — ABNORMAL LOW (ref 3.5–5.1)
Sodium: 137 mEq/L (ref 135–145)

## 2018-11-27 LAB — CBC WITH DIFFERENTIAL/PLATELET
Basophils Absolute: 0.1 10*3/uL (ref 0.0–0.1)
Basophils Relative: 0.9 % (ref 0.0–3.0)
EOS PCT: 3 % (ref 0.0–5.0)
Eosinophils Absolute: 0.3 10*3/uL (ref 0.0–0.7)
HCT: 40.6 % (ref 36.0–46.0)
Hemoglobin: 13.6 g/dL (ref 12.0–15.0)
Lymphocytes Relative: 21.7 % (ref 12.0–46.0)
Lymphs Abs: 2.3 10*3/uL (ref 0.7–4.0)
MCHC: 33.4 g/dL (ref 30.0–36.0)
MCV: 84.6 fl (ref 78.0–100.0)
Monocytes Absolute: 0.6 10*3/uL (ref 0.1–1.0)
Monocytes Relative: 5.2 % (ref 3.0–12.0)
Neutro Abs: 7.3 10*3/uL (ref 1.4–7.7)
Neutrophils Relative %: 69.2 % (ref 43.0–77.0)
Platelets: 258 10*3/uL (ref 150.0–400.0)
RBC: 4.8 Mil/uL (ref 3.87–5.11)
RDW: 14.9 % (ref 11.5–15.5)
WBC: 10.5 10*3/uL (ref 4.0–10.5)

## 2018-11-27 NOTE — Telephone Encounter (Signed)
Labs ordered.

## 2018-11-27 NOTE — Telephone Encounter (Signed)
Pt scheduled for lab appt today, please place future orders. Thank you

## 2018-11-27 NOTE — Progress Notes (Signed)
Order placed for labs.

## 2018-11-28 ENCOUNTER — Other Ambulatory Visit: Payer: Self-pay | Admitting: Internal Medicine

## 2018-11-28 DIAGNOSIS — E876 Hypokalemia: Secondary | ICD-10-CM

## 2018-11-28 NOTE — Progress Notes (Signed)
Order placed for f/u potassium.  

## 2018-12-03 ENCOUNTER — Ambulatory Visit
Admission: RE | Admit: 2018-12-03 | Discharge: 2018-12-03 | Disposition: A | Discharge status: Home / Self Care | Payer: BLUE CROSS/BLUE SHIELD | Source: Ambulatory Visit | Attending: Internal Medicine | Admitting: Internal Medicine

## 2018-12-03 DIAGNOSIS — R1084 Generalized abdominal pain: Secondary | ICD-10-CM

## 2018-12-03 MED ORDER — IOPAMIDOL (ISOVUE-300) INJECTION 61%
100.0000 mL | Freq: Once | INTRAVENOUS | Status: AC | PRN
  Administered 2018-12-03: 100 mL via INTRAVENOUS

## 2018-12-04 ENCOUNTER — Other Ambulatory Visit: Payer: Self-pay

## 2018-12-04 ENCOUNTER — Other Ambulatory Visit (INDEPENDENT_AMBULATORY_CARE_PROVIDER_SITE_OTHER): Payer: BLUE CROSS/BLUE SHIELD

## 2018-12-04 ENCOUNTER — Encounter: Payer: Self-pay | Admitting: Internal Medicine

## 2018-12-04 DIAGNOSIS — E876 Hypokalemia: Secondary | ICD-10-CM

## 2018-12-04 LAB — POTASSIUM: Potassium: 3.7 mEq/L (ref 3.5–5.1)

## 2018-12-05 ENCOUNTER — Other Ambulatory Visit: Payer: BLUE CROSS/BLUE SHIELD

## 2018-12-06 ENCOUNTER — Encounter: Payer: Self-pay | Admitting: Internal Medicine

## 2018-12-08 ENCOUNTER — Encounter: Payer: Self-pay | Admitting: Internal Medicine

## 2018-12-09 ENCOUNTER — Other Ambulatory Visit: Payer: Self-pay

## 2018-12-09 ENCOUNTER — Encounter: Payer: Self-pay | Admitting: Family Medicine

## 2018-12-09 DIAGNOSIS — J4541 Moderate persistent asthma with (acute) exacerbation: Secondary | ICD-10-CM

## 2018-12-09 MED ORDER — IPRATROPIUM-ALBUTEROL 0.5-2.5 (3) MG/3ML IN SOLN: 3.0000 mL | RESPIRATORY_TRACT | 2 refills | Status: DC | PRN

## 2018-12-09 NOTE — Telephone Encounter (Signed)
This has previously been filled by Ander Purpura and Dr. Gayland Curry. Are you okay with sending in larger quantity?

## 2018-12-09 NOTE — Telephone Encounter (Signed)
Ok to send in larger quantity.  It appears that duoneb has been sent in.  Is this what she has always used, or has she used just albuterol.  Ok to send in albuterol nebs for 3 month supply.

## 2018-12-09 NOTE — Telephone Encounter (Signed)
See other mychart message.

## 2018-12-10 ENCOUNTER — Other Ambulatory Visit: Payer: Self-pay

## 2018-12-10 ENCOUNTER — Ambulatory Visit: Payer: BLUE CROSS/BLUE SHIELD | Admitting: Gastroenterology

## 2018-12-10 ENCOUNTER — Encounter: Payer: Self-pay | Admitting: Gastroenterology

## 2018-12-10 VITALS — BP 134/84 | HR 103 | Ht 66.0 in | Wt 174.8 lb

## 2018-12-10 DIAGNOSIS — R1084 Generalized abdominal pain: Secondary | ICD-10-CM | POA: Diagnosis not present

## 2018-12-10 DIAGNOSIS — K625 Hemorrhage of anus and rectum: Secondary | ICD-10-CM | POA: Diagnosis not present

## 2018-12-10 MED ORDER — HYOSCYAMINE SULFATE 0.125 MG/ML PO SOLN: 0.1250 mg | Freq: Three times a day (TID) | ORAL | 1 refills | Status: DC | PRN

## 2018-12-10 NOTE — Progress Notes (Addendum)
Heather [name], [place] [address]  Main: [phone]  Fax: [phone]   Gastroenterology Consultation  Referring Provider:     Einar Pheasant, MD Primary Care Physician:  Einar Pheasant, MD Reason for Consultation:     Abdominal pain        HPI:    Chief Complaint  Patient presents with  . Abdominal Pain    ZENITA KISTER is a 43 y.o. y/o female referred for consultation & management  by Dr. Einar Pheasant, MD.  Patient reports chronic history of intermittent abdominal pain, that starts in the left lower quadrant region, and then moves to the entire abdomen depending on how severe it is.  States these episodes occur 1-2 times a week, but sometimes she can go months without an episode.  Describes the pain as cramping, nonradiating, 5/10.  Improves with liquid dicyclomine only, not the tablet dicyclomine.  Also improves after she has a bowel movement.  However, states she had a severe episode about 6 to 8 weeks ago and went to her primary care provider.  CT scan was done that was normal.  Patient states this episode was different as it did not improve after bowel movement, it did not improve after taking dicyclomine.  As at this time the pain is completely gone.  No nausea or vomiting.  No heartburn or dysphagia.  However, since the severe episodes have occurred 2 or 3 times since last 6 weeks she is concerned and is here to discuss endoscopic evaluation.  She states she had an upper endoscopy and colonoscopy 5 years ago and was told to have another one in 5 years.  We do not have these procedure reports but there is an [facility] colonoscopy listed under procedures in 2013.  Which states recommended follow-up 5 years.  The results are not available under provation either.  I was able to locate a 2010 colonoscopy and EGD report under her middle name listed as the last name and provation.  This was done by Dr. Gustavo Lah for abdominal pain.  Colonoscopy  was reported to be normal and biopsies were obtained.  EGD reports suggestion of esophageal dysmotility, gastric erythema and erosions, and suspicion for celiac disease.  The biopsy reports from these procedures are not available.  I suspect that the 2013 entry under procedures in her epic chart is actually in relation to her 2010 procedures as these are the only procedures that she has not done for GI.  Past Medical History:  Diagnosis Date  . Asthma   . Depression    bipolar  . Fainting spell    H/O  . H/O cardiac arrhythmia   . Migraines   . Seizures ([place])     Past Surgical History:  Procedure Laterality Date  . TUBAL LIGATION Bilateral 09/2002    Prior to Admission medications   Medication Sig Start Date End Date Taking? Authorizing Provider  albuterol (PROVENTIL HFA;VENTOLIN HFA) 108 (90 Base) MCG/ACT inhaler Inhale 1-2 puffs into the lungs every 4 (four) hours as needed for wheezing or shortness of breath. 10/24/17  Yes McLean-Scocuzza, Nino Glow, MD  budesonide-formoterol [facility]) 160-4.5 MCG/ACT inhaler Inhale 2 puffs into the lungs 2 (two) times daily. Rinse mouth 10/24/17  Yes McLean-Scocuzza, Nino Glow, MD  butalbital-acetaminophen-caffeine (FIORICET, ESGIC) [phone] MG tablet Take 1 tablet by mouth 2 (two) times daily as needed for headache. 11/20/18 11/20/19 Yes Einar Pheasant, MD  ipratropium-albuterol (DUONEB) 0.5-2.5 (3) MG/3ML SOLN Take 3  mLs by nebulization every 4 (four) hours as needed. 12/09/18  Yes Einar Pheasant, MD  magnesium oxide (MAG-OX) 400 MG tablet Take 1 tablet (400 mg total) by mouth daily. 11/19/18  Yes Einar Pheasant, MD  hyoscyamine (HYOSYNE) 0.125 MG/ML solution Take 1 mL (0.125 mg total) by mouth 3 (three) times daily as needed for up to 30 days for cramping. 12/10/18 01/09/19  Virgel Manifold, MD    Family History  Problem Relation Age of Onset  . Hypertension Mother   . Diabetes Mother   . Arthritis Mother   . Heart disease Mother   . Breast  cancer Neg Hx      Social History   Tobacco Use  . Smoking status: Former Smoker    Packs/day: 0.50    Types: Cigarettes  . Smokeless tobacco: Never Used  . Tobacco comment: Quit mid October 2014  Substance Use Topics  . Alcohol use: No    Alcohol/week: 0.0 standard drinks  . Drug use: No    Allergies as of 12/10/2018 - Review Complete 12/10/2018  Allergen Reaction Noted  . Augmentin [amoxicillin-pot clavulanate]  02/20/2013  . Erythromycin Nausea And Vomiting 06/25/2015  . Lithium Nausea Only 02/20/2013  . Tramadol  02/20/2013    Review of Systems:    All systems reviewed and negative except where noted in HPI.   Physical Exam:  BP 134/84   Pulse (!) 103   Ht 5\' 6"  (1.676 m)   Wt 174 lb 12.8 oz (79.3 kg)   BMI 28.21 kg/m  No LMP recorded. Psych:  Alert and cooperative. Normal mood and affect. General:   Alert,  Well-developed, well-nourished, pleasant and cooperative in NAD Head:  Normocephalic and atraumatic. Eyes:  Sclera clear, no icterus.   Conjunctiva pink. Ears:  Normal auditory acuity. Nose:  No deformity, discharge, or lesions. Mouth:  No deformity or lesions,oropharynx pink & moist. Neck:  Supple; no masses or thyromegaly. Abdomen:  Normal bowel sounds.  No bruits.  Soft, non-tender and non-distended without masses, hepatosplenomegaly or hernias noted.  No guarding or rebound tenderness.    Msk:  Symmetrical without gross deformities. Good, equal movement & strength bilaterally. Pulses:  Normal pulses noted. Extremities:  No clubbing or edema.  No cyanosis. Neurologic:  Alert and oriented x3;  grossly normal neurologically. Skin:  Intact without significant lesions or rashes. No jaundice. Lymph Nodes:  No significant cervical adenopathy. Psych:  Alert and cooperative. Normal mood and affect.   Labs: CBC    Component Value Date/Time   WBC 10.5 11/27/2018 1441   RBC 4.80 11/27/2018 1441   HGB 13.6 11/27/2018 1441   HGB 13.2 06/19/2014 1437   HCT  40.6 11/27/2018 1441   HCT 40.7 06/19/2014 1437   PLT 258.0 11/27/2018 1441   PLT 185 06/19/2014 1437   MCV 84.6 11/27/2018 1441   MCV 87 06/19/2014 1437   MCH 28.1 02/02/2016 1641   MCHC 33.4 11/27/2018 1441   RDW 14.9 11/27/2018 1441   RDW 14.2 06/19/2014 1437   LYMPHSABS 2.3 11/27/2018 1441   MONOABS 0.6 11/27/2018 1441   EOSABS 0.3 11/27/2018 1441   BASOSABS 0.1 11/27/2018 1441   CMP     Component Value Date/Time   NA 137 11/27/2018 1441   NA 138 06/19/2014 1437   K 3.7 12/04/2018 1419   K 3.5 06/19/2014 1437   CL 105 11/27/2018 1441   CL 107 06/19/2014 1437   CO2 23 11/27/2018 1441   CO2 25 06/19/2014 1437  GLUCOSE 115 (H) 11/27/2018 1441   GLUCOSE 105 (H) 06/19/2014 1437   BUN 9 11/27/2018 1441   BUN 5 (L) 06/19/2014 1437   CREATININE 0.76 11/27/2018 1441   CREATININE 0.89 06/19/2014 1437   CALCIUM 8.8 11/27/2018 1441   CALCIUM 8.2 (L) 06/19/2014 1437   PROT 6.8 11/19/2018 0949   PROT 7.1 06/19/2014 1437   ALBUMIN 4.2 11/19/2018 0949   ALBUMIN 3.5 06/19/2014 1437   AST 15 11/19/2018 0949   AST 32 06/19/2014 1437   ALT 24 11/19/2018 0949   ALT 49 06/19/2014 1437   ALKPHOS 113 11/19/2018 0949   ALKPHOS 169 (H) 06/19/2014 1437   BILITOT 0.4 11/19/2018 0949   BILITOT 0.4 06/19/2014 1437   GFRNONAA >60 02/02/2016 1641   GFRNONAA >60 06/19/2014 1437   GFRNONAA >60 04/23/2013 0950   GFRAA >60 02/02/2016 1641   GFRAA >60 06/19/2014 1437   GFRAA >60 04/23/2013 0950    Imaging Studies: Ct Abdomen Pelvis W Contrast  Result Date: 12/04/2018 CLINICAL DATA:  Diffuse abdominal pain. EXAM: CT ABDOMEN AND PELVIS WITH CONTRAST TECHNIQUE: Multidetector CT imaging of the abdomen and pelvis was performed using the standard protocol following bolus administration of intravenous contrast. CONTRAST:  142mL ISOVUE-300 IOPAMIDOL (ISOVUE-300) INJECTION 61% COMPARISON:  None. FINDINGS: Lower chest: Unremarkable Hepatobiliary: No suspicious focal abnormality within the liver  parenchyma. There is no evidence for gallstones, gallbladder wall thickening, or pericholecystic fluid. No intrahepatic or extrahepatic biliary dilation. Pancreas: No focal mass lesion. No dilatation of the main duct. No intraparenchymal cyst. No peripancreatic edema. Spleen: No splenomegaly. No focal mass lesion. Adrenals/Urinary Tract: No adrenal nodule or mass. Kidneys unremarkable. No evidence for hydroureter. The urinary bladder appears normal for the degree of distention. Stomach/Bowel: Stomach is unremarkable. No gastric wall thickening. No evidence of outlet obstruction. Duodenum is normally positioned as is the ligament of Treitz. No small bowel wall thickening. No small bowel dilatation. The terminal ileum is normal. The appendix is not visualized, but there is no edema or inflammation in the region of the cecum. No gross colonic mass. No colonic wall thickening. Vascular/Lymphatic: There is abdominal aortic atherosclerosis without aneurysm. There is no gastrohepatic or hepatoduodenal ligament lymphadenopathy. No intraperitoneal or retroperitoneal lymphadenopathy. No pelvic sidewall lymphadenopathy. Reproductive: The uterus is unremarkable.  There is no adnexal mass. Other: No intraperitoneal free fluid. Musculoskeletal: No worrisome lytic or sclerotic osseous abnormality. IMPRESSION: 1. Unremarkable CT scan of the abdomen and pelvis. Specifically, no findings to explain the patient's history of generalized abdominal pain. Electronically Signed   By: Misty Stanley M.D.   On: 12/04/2018 08:31    Assessment and Plan:   Heather [name] is a 43 y.o. y/o female has been referred for abdominal pain  Patient is concerned about the change in her symptoms starting 6 weeks ago, she calls the pain is excruciating when it occurs  In addition, the 2013 [facility] colonoscopy states recommended follow-up was 5 years.  Unclear why repeat colonoscopy was recommended in 5 years.  Patient denies any family history of colon  cancer.  pathology report available.    However, she is reporting that she had one episode of bright red blood per rectum, with no stool associated with that episode.  This occurred 6 weeks ago when she had the initial episode of excruciating pain as described above.  This is most consistent with likely internal hemorrhoids.  However, given that colonoscopy was recommended in 5 years after the last 1 and she has changes in symptoms  at this time, EGD and colonoscopy for abdominal pain is reasonable.  I have discussed alternative options, risks & benefits,  which include, but are not limited to, bleeding, infection, perforation,respiratory complication & drug reaction.  The patient agrees with this plan & written consent will be obtained.    She states she is unable to obtain liquid dicyclomine as her pharmacy is out of bed and is requesting another alternative medication in liquid form.  I have therefore sent over hyoscyamine to her pharmacy.    Dr Heather Antigua  Speech recognition software was used to dictate the above note.

## 2018-12-11 ENCOUNTER — Other Ambulatory Visit: Payer: Self-pay

## 2018-12-11 ENCOUNTER — Ambulatory Visit
Admission: RE | Admit: 2018-12-11 | Discharge: 2018-12-11 | Disposition: A | Discharge status: Home / Self Care | Payer: BLUE CROSS/BLUE SHIELD | Source: Ambulatory Visit | Attending: Internal Medicine | Admitting: Internal Medicine

## 2018-12-11 DIAGNOSIS — Z1231 Encounter for screening mammogram for malignant neoplasm of breast: Secondary | ICD-10-CM | POA: Diagnosis not present

## 2018-12-12 ENCOUNTER — Other Ambulatory Visit: Payer: Self-pay

## 2018-12-12 DIAGNOSIS — R1084 Generalized abdominal pain: Secondary | ICD-10-CM

## 2018-12-12 DIAGNOSIS — K625 Hemorrhage of anus and rectum: Secondary | ICD-10-CM

## 2018-12-13 ENCOUNTER — Encounter: Payer: Self-pay | Admitting: Internal Medicine

## 2018-12-13 NOTE — Telephone Encounter (Signed)
Left message to call back  

## 2018-12-13 NOTE — Telephone Encounter (Signed)
Confirmed pt doing okay. No travel. No fever. Advised to go to the ED or urgent care if symptoms worsen over the weekend that effect her breathing or any other acute symptoms. Patient scheduled for Monday morning.

## 2018-12-16 ENCOUNTER — Ambulatory Visit: Payer: BLUE CROSS/BLUE SHIELD | Admitting: Gastroenterology

## 2018-12-16 ENCOUNTER — Telehealth: Payer: Self-pay

## 2018-12-16 ENCOUNTER — Telehealth: Payer: BLUE CROSS/BLUE SHIELD | Admitting: Internal Medicine

## 2018-12-16 NOTE — Telephone Encounter (Signed)
Attempted to reach patient x3 to schedule web visit

## 2018-12-16 NOTE — Telephone Encounter (Signed)
Tried to reach patient by phone x2 and called emergency contact. Left message for patient to return call

## 2018-12-16 NOTE — Telephone Encounter (Signed)
Late entry.  Given her issues (breathing, need for muscle relaxer, etc), would like to do web visit with pt as we discussed today.  Please schedule.

## 2018-12-16 NOTE — Telephone Encounter (Signed)
Spoke with patient over the phone. She has not traveled and has not ran a fever. She is having increased SOB, chest tightness, cough and congestion. She is using her neb treatments about 6 times a day which does help. She has been seen a few times recently by NP and been on abx recently. Pt says she feels okay during the day as long as she uses her neb treatments. Cough is worse at night. She gets up in the middle of the night 1-2 times to use her neb machine. Cough is productive, she is coughing up light yellowish phlegm. Drainage was green at first about 2 weeks ago. She also says that she thinks she has pulled a muscle in her back from coughing and is requesting a muscle relaxer. Advised that we are working on getting web ex visits set up and would be in touch with her

## 2018-12-17 ENCOUNTER — Other Ambulatory Visit: Payer: Self-pay

## 2018-12-17 ENCOUNTER — Telehealth (INDEPENDENT_AMBULATORY_CARE_PROVIDER_SITE_OTHER): Payer: BLUE CROSS/BLUE SHIELD | Admitting: Internal Medicine

## 2018-12-17 DIAGNOSIS — J4520 Mild intermittent asthma, uncomplicated: Secondary | ICD-10-CM

## 2018-12-17 DIAGNOSIS — M546 Pain in thoracic spine: Secondary | ICD-10-CM | POA: Diagnosis not present

## 2018-12-17 MED ORDER — DOXYCYCLINE HYCLATE 100 MG PO TABS: 100.0000 mg | ORAL_TABLET | Freq: Two times a day (BID) | ORAL | 0 refills | Status: DC

## 2018-12-17 MED ORDER — PREDNISONE 10 MG PO TABS: ORAL_TABLET | ORAL | 0 refills | Status: DC

## 2018-12-17 NOTE — Telephone Encounter (Signed)
LMTCB

## 2018-12-17 NOTE — Telephone Encounter (Signed)
Spoke with pt on phone.

## 2018-12-17 NOTE — Telephone Encounter (Signed)
Attempted to reach pt by phone x2. Sent MyChart

## 2018-12-17 NOTE — Progress Notes (Signed)
Virtual Visit via Video Note  I connected with Heather [name] on 12/21/18 at  3:30 PM EDT by a video enabled telemedicine application and verified that I am speaking with the correct person using two identifiers. Location patient: home Location provider:work  Persons participating in the virtual visit: patient, provider.    I discussed the limitations of evaluation and management by telemedicine.  The patient expressed understanding and agreed to proceed.  This visit type was felt to be most appropriate for this patient at this time due to national recommendations for restrictions regarding the COVID-19 pandemic.  No physical exam was performed (except for noted visual exam findings with Video visits).     HPI: 43 year old female with persistent increased cough and congestion.  States symptoms started 2.5 weeks ago.  Was sneezing.  Nasal congestion.  Has taken claritin D and sudafed.  Also using nasal flushes.  Has moved into her chest.  Increased cough - productive of yellow and brown mucus.  Has asthma.  Feels like asthma flare.  Has been using neb treatments at home, which help temporarily.  Some left side/back discomfort from coughing.  Was on abx two months ago, but has not been on abx since.  Some decreased appetite.  Still eating.  Trying to stay hydrated.  No vomiting.  No diarrhea.     ROS: See pertinent positives and negatives per HPI.   Past Medical History:  Diagnosis Date  . Asthma   . Depression    bipolar  . Fainting spell    H/O  . H/O cardiac arrhythmia   . Migraines   . Seizures ([place])     Past Surgical History:  Procedure Laterality Date  . TUBAL LIGATION Bilateral 09/2002    Family History  Problem Relation Age of Onset  . Hypertension Mother   . Diabetes Mother   . Arthritis Mother   . Heart disease Mother   . Breast cancer Neg Hx     SOCIAL HX: reviewed.     Current Outpatient Medications:  .  albuterol (PROVENTIL HFA;VENTOLIN HFA) 108 (90 Base)  MCG/ACT inhaler, Inhale 1-2 puffs into the lungs every 4 (four) hours as needed for wheezing or shortness of breath., Disp: 1 Inhaler, Rfl: 11 .  budesonide-formoterol (SYMBICORT) 160-4.5 MCG/ACT inhaler, Inhale 2 puffs into the lungs 2 (two) times daily. Rinse mouth, Disp: 1 Inhaler, Rfl: 12 .  butalbital-acetaminophen-caffeine (FIORICET, ESGIC) 50-325-40 MG tablet, Take 1 tablet by mouth 2 (two) times daily as needed for headache., Disp: 10 tablet, Rfl: 0 .  doxycycline (VIBRA-TABS) 100 MG tablet, Take 1 tablet (100 mg total) by mouth 2 (two) times daily., Disp: 20 tablet, Rfl: 0 .  hyoscyamine (HYOSYNE) 0.125 MG/ML solution, Take 1 mL (0.125 mg total) by mouth 3 (three) times daily as needed for up to 30 days for cramping., Disp: 15 mL, Rfl: 1 .  ipratropium-albuterol (DUONEB) 0.5-2.5 (3) MG/3ML SOLN, Take 3 mLs by nebulization every 4 (four) hours as needed., Disp: 360 mL, Rfl: 2 .  magnesium oxide (MAG-OX) 400 MG tablet, Take 1 tablet (400 mg total) by mouth daily., Disp: 30 tablet, Rfl: 1 .  predniSONE (DELTASONE) 10 MG tablet, Take 6 tablets x 1 day and then decrease by 1/2 table per day until down to zero mg., Disp: 39 tablet, Rfl: 0  EXAM:  GENERAL: alert, oriented.  No acute distress  HEENT: atraumatic, conjunttiva clear, no obvious abnormalities on inspection of external nose and ears  NECK: normal movements of  the head and neck  LUNGS: on inspection no signs of respiratory distress, breathing rate appears normal, no obvious gross SOB.  Increased cough with forced expiration.    CV: no obvious cyanosis  PSYCH/NEURO: pleasant and cooperative, no obvious depression or anxiety, speech and thought processing grossly intact   ASSESSMENT AND PLAN:  Discussed the following assessment and plan:  Mild intermittent asthma without complication  Acute left-sided thoracic back pain     I discussed the assessment and treatment plan with the patient. The patient was provided an  opportunity to ask questions and all were answered. The patient agreed with the plan and demonstrated an understanding of the instructions.   The patient was advised to call back or seek an in-person evaluation if the symptoms worsen or if the condition fails to improve as anticipated.  I provided 20 minutes of non-face-to-face time during this encounter.   Einar Pheasant, MD

## 2018-12-21 ENCOUNTER — Encounter: Payer: Self-pay | Admitting: Internal Medicine

## 2018-12-21 DIAGNOSIS — M549 Dorsalgia, unspecified: Secondary | ICD-10-CM | POA: Insufficient documentation

## 2018-12-21 NOTE — Assessment & Plan Note (Signed)
Back/side pain as outlined.  Feels related to increased cough.  Treat infection and asthma flare as outlined.  Follow.  Can use tylenol.

## 2018-12-21 NOTE — Assessment & Plan Note (Signed)
With known asthma, presents with increased congestion and cough and wheezing.  No known exposures to COVID.   Feels like typical asthma flare.  Treat with doxycycline as directed.  Take probiotic.  Continue inhalers and add robitussin DM.  Prednisone taper as directed.  Follow closely.  Call with update.

## 2018-12-23 ENCOUNTER — Telehealth: Payer: Self-pay

## 2018-12-23 ENCOUNTER — Other Ambulatory Visit: Payer: Self-pay

## 2018-12-23 ENCOUNTER — Encounter: Payer: Self-pay | Admitting: Internal Medicine

## 2018-12-23 DIAGNOSIS — R1084 Generalized abdominal pain: Secondary | ICD-10-CM

## 2018-12-23 NOTE — Telephone Encounter (Signed)
LVM for pt to inform Heather [name] that Heather [name] Colon/EGD scheduled for 01/10/19 has been canceled due to [place] restrictions.  I've asked Heather [name] to call back to let Heather [name] know that Dr.T would like for Heather [name] to have H-Pylori serology done at [facility]

## 2018-12-24 ENCOUNTER — Other Ambulatory Visit: Payer: Self-pay

## 2018-12-24 DIAGNOSIS — R1084 Generalized abdominal pain: Secondary | ICD-10-CM

## 2018-12-26 ENCOUNTER — Encounter: Payer: Self-pay | Admitting: Internal Medicine

## 2018-12-26 ENCOUNTER — Other Ambulatory Visit: Payer: Self-pay

## 2018-12-26 DIAGNOSIS — J4541 Moderate persistent asthma with (acute) exacerbation: Secondary | ICD-10-CM

## 2018-12-26 MED ORDER — ALBUTEROL SULFATE HFA 108 (90 BASE) MCG/ACT IN AERS: 1.0000 | INHALATION_SPRAY | RESPIRATORY_TRACT | 11 refills | Status: DC | PRN

## 2018-12-26 MED ORDER — BUDESONIDE-FORMOTEROL FUMARATE 160-4.5 MCG/ACT IN AERO: 2.0000 | INHALATION_SPRAY | Freq: Two times a day (BID) | RESPIRATORY_TRACT | 12 refills | Status: DC

## 2018-12-26 NOTE — Telephone Encounter (Signed)
See other note

## 2019-01-02 ENCOUNTER — Ambulatory Visit (INDEPENDENT_AMBULATORY_CARE_PROVIDER_SITE_OTHER): Payer: BLUE CROSS/BLUE SHIELD | Admitting: Internal Medicine

## 2019-01-02 DIAGNOSIS — J4520 Mild intermittent asthma, uncomplicated: Secondary | ICD-10-CM | POA: Diagnosis not present

## 2019-01-02 DIAGNOSIS — G43809 Other migraine, not intractable, without status migrainosus: Secondary | ICD-10-CM

## 2019-01-02 DIAGNOSIS — K219 Gastro-esophageal reflux disease without esophagitis: Secondary | ICD-10-CM

## 2019-01-02 MED ORDER — FAMOTIDINE 20 MG PO TABS: 20.0000 mg | ORAL_TABLET | Freq: Every day | ORAL | 1 refills | Status: DC

## 2019-01-02 MED ORDER — FLUTICASONE PROPIONATE 50 MCG/ACT NA SUSP: 2.0000 | Freq: Every day | NASAL | 1 refills | Status: DC

## 2019-01-02 MED ORDER — PREDNISONE 10 MG PO TABS: ORAL_TABLET | ORAL | 0 refills | Status: DC

## 2019-01-02 NOTE — Progress Notes (Addendum)
Patient ID: Heather [name], female   DOB: Nov 24, 1975, 43 y.o.   MRN: [id number] Virtual Visit via Video: Note  This visit type was conducted due to national recommendations for restrictions regarding the COVID-19 pandemic (e.g. social distancing).  This format is felt to be most appropriate for this patient at this time.  All issues noted in this document were discussed and addressed.  No physical exam was performed (except for noted visual exam findings with Video Visits).   I connected with Heather [name] on 01/02/19 at 11:30 AM EDT by a video enabled telemedicine application and verified that I am speaking with the correct person using two identifiers. Location patient: home Location provider: work Persons participating in the virtual visit: patient, provider  I discussed the limitations, risks, security and privacy concerns of performing an evaluation and management service by telephone and the availability of in person appointments. The patient expressed understanding and agreed to proceed.   Reason for visit: scheduled follow up.   HPI: Was recently evaluated 12/17/18 - telemedicine visit - with cough and congestion. Treated with abx and prednisone.  Is feeling better.  Still with increased cough.  Has a history of asthma.  Still requiring nebs, but the frequency of neb treatments has improved.  Now only requiring 1-2 nebs per day and one at night.  Feels needs another short course of prednisone.  Discussed trying to avoid work.  She works at [employer].  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 Trying to stay at how.  No fever.  Some hoarseness.  Some right maxillary sinus pressure.  No sore throat now.  Ticking in her throat.  Using symbicort.  Eating.  No vomiting.  Some acid reflux.  discussed could be contributing to her cough.  No diarrhea.      ROS: See pertinent positives and negatives per HPI.  Past Medical History:  Diagnosis Date  . Asthma   . Depression    bipolar  . Fainting spell    H/O  . H/O cardiac arrhythmia   . Migraines   . Seizures ([place])     Past Surgical History:  Procedure Laterality Date  . TUBAL LIGATION Bilateral 09/2002    Family History  Problem Relation Age of Onset  . Hypertension Mother   . Diabetes Mother   . Arthritis Mother   . Heart disease Mother   . Breast cancer Neg Hx     SOCIAL HX: reviewed.    Current Outpatient Medications:  .  albuterol (PROVENTIL HFA;VENTOLIN HFA) 108 (90 Base) MCG/ACT inhaler, Inhale 1-2 puffs into the lungs every 4 (four) hours as needed for wheezing or shortness of breath., Disp: 1 Inhaler, Rfl: 11 .  budesonide-formoterol (SYMBICORT) 160-4.5 MCG/ACT inhaler, Inhale 2 puffs into the lungs 2 (two) times daily. Rinse mouth, Disp: 1 Inhaler, Rfl: 12 .  butalbital-acetaminophen-caffeine (FIORICET, ESGIC) 50-325-40 MG tablet, Take 1 tablet by mouth 2 (two) times daily as needed for headache., Disp: 10 tablet, Rfl: 0 .  dicyclomine (BENTYL) 10 MG/5ML syrup, Take 5 mLs by mouth at bedtime as needed., Disp: , Rfl:  .  famotidine (PEPCID) 20 MG tablet, Take 1 tablet (20 mg total) by mouth daily., Disp: 30 tablet, Rfl: 1 .  fluticasone (FLONASE) 50 MCG/ACT nasal spray, Place 2 sprays into both nostrils daily., Disp: 16 g, Rfl: 1 .  ipratropium-albuterol (DUONEB) 0.5-2.5 (3) MG/3ML SOLN, Take 3 mLs by nebulization every 4 (four) hours as needed., Disp: 360 mL, Rfl: 2 .  magnesium oxide (MAG-OX) 400 MG tablet, Take 1 tablet (400 mg total) by mouth daily., Disp: 30 tablet, Rfl: 1 .  predniSONE (DELTASONE) 10 MG tablet, Take 4 tablets x 1 day and then decrease by 1/2 table per day until down to zero mg., Disp: 18 tablet, Rfl: 0  EXAM:  GENERAL: alert, oriented, appears well and in no acute distress  HEENT: atraumatic, conjunttiva clear, no obvious abnormalities on inspection of external nose and ears  NECK: normal movements of the head and neck  LUNGS: on inspection no signs of  respiratory distress, breathing rate appears normal, no obvious gross SOB, gasping or wheezing.  Minimal increased cough with forced expiration.    CV: no obvious cyanosis  PSYCH/NEURO: pleasant and cooperative, no obvious depression or anxiety, speech and thought processing grossly intact  ASSESSMENT AND PLAN:  Discussed the following assessment and plan:  Mild intermittent asthma without complication  Other migraine without status migrainosus, not intractable  Gastroesophageal reflux disease, esophagitis presence not specified  Asthma With known asthma.  Persistent cough and congestion, but better.  Discussed the need to stay home.  Hold on further abx.  Prednisone taper as directed.  Continue inhalers.  Has nebulizer if needed.  Rest.  Fluids.  Treat acid reflux.  Flonase nasal spray as directed.    Migraine No headache today.  Has appt with neurology to discuss.    GERD (gastroesophageal reflux disease) Acid reflux as outlined.  pepcid as directed.  Follow.      I discussed the assessment and treatment plan with the patient. The patient was provided an opportunity to ask questions and all were answered. The patient agreed with the plan and demonstrated an understanding of the instructions.   The patient was advised to call back or seek an in-person evaluation if the symptoms worsen or if the condition fails to improve as anticipated.   Einar Pheasant, MD

## 2019-01-05 ENCOUNTER — Encounter: Payer: Self-pay | Admitting: Internal Medicine

## 2019-01-05 DIAGNOSIS — K219 Gastro-esophageal reflux disease without esophagitis: Secondary | ICD-10-CM | POA: Insufficient documentation

## 2019-01-05 NOTE — Assessment & Plan Note (Signed)
Acid reflux as outlined.  pepcid as directed.  Follow.

## 2019-01-05 NOTE — Assessment & Plan Note (Signed)
No headache today.  Has appt with neurology to discuss.

## 2019-01-05 NOTE — Assessment & Plan Note (Signed)
With known asthma.  Persistent cough and congestion, but better.  Discussed the need to stay home.  Hold on further abx.  Prednisone taper as directed.  Continue inhalers.  Has nebulizer if needed.  Rest.  Fluids.  Treat acid reflux.  Flonase nasal spray as directed.

## 2019-01-10 ENCOUNTER — Ambulatory Visit
Admission: RE | Admit: 2019-01-10 | Payer: BLUE CROSS/BLUE SHIELD | Source: Home / Self Care | Admitting: Gastroenterology

## 2019-01-10 ENCOUNTER — Encounter: Admission: RE | Payer: Self-pay | Source: Home / Self Care

## 2019-01-10 SURGERY — COLONOSCOPY WITH PROPOFOL
Anesthesia: General

## 2019-01-22 DIAGNOSIS — Z87898 Personal history of other specified conditions: Secondary | ICD-10-CM | POA: Insufficient documentation

## 2019-01-29 ENCOUNTER — Telehealth: Payer: Self-pay | Admitting: Lab

## 2019-01-29 ENCOUNTER — Ambulatory Visit (INDEPENDENT_AMBULATORY_CARE_PROVIDER_SITE_OTHER): Payer: BLUE CROSS/BLUE SHIELD | Admitting: Family

## 2019-01-29 ENCOUNTER — Encounter: Payer: Self-pay | Admitting: Family

## 2019-01-29 DIAGNOSIS — B029 Zoster without complications: Secondary | ICD-10-CM | POA: Diagnosis not present

## 2019-01-29 MED ORDER — VALACYCLOVIR HCL 1 G PO TABS: 1000.0000 mg | ORAL_TABLET | Freq: Three times a day (TID) | ORAL | 0 refills | Status: DC

## 2019-01-29 MED ORDER — GABAPENTIN 100 MG PO CAPS: 100.0000 mg | ORAL_CAPSULE | Freq: Three times a day (TID) | ORAL | 0 refills | Status: DC

## 2019-01-29 NOTE — Telephone Encounter (Signed)
Pt Rx of DICYCLOMINE is on back order, NP Lauren Guse wanted me to call Pt to ask if she would be willing to take tablet form. Will try calling back later.

## 2019-01-29 NOTE — Assessment & Plan Note (Signed)
Presentation most consistent with herpes zoster.  Reasonable to trial Valtrex, gabapentin for pain.  Close follow-up.  Patient will let me know of any new or worsening symptoms

## 2019-01-29 NOTE — Telephone Encounter (Signed)
error 

## 2019-01-29 NOTE — Patient Instructions (Addendum)
May use Capsaicin over the counter once lesions have dried up.  Suspect shingles.   Start valtrex  Start gabapentin for pain. We may need to increase this medication. No alcohol , driving, heavy machinery on medication as it can make you sleepy.  Shingles can be contagious- please the following information in regards to how to protect others.   Please let me know of any new or worsening symptoms.   Shingles  Shingles, which is also known as herpes zoster, is an infection that causes a painful skin rash and fluid-filled blisters. It is caused by a virus. Shingles only develops in people who:  Have had chickenpox.  Have been given a medicine to protect against chickenpox (have been vaccinated). Shingles is rare in this group. What are the causes? Shingles is caused by varicella-zoster virus (VZV). This is the same virus that causes chickenpox. After a person is exposed to VZV, the virus stays in the body in an inactive (dormant) state. Shingles develops if the virus is reactivated. This can happen many years after the first (initial) exposure to VZV. It is not known what causes this virus to be reactivated. What increases the risk? People who have had chickenpox or received the chickenpox vaccine are at risk for shingles. Shingles infection is more common in people who:  Are older than age 47.  Have a weakened disease-fighting system (immune system), such as people with: ? HIV. ? AIDS. ? Cancer.  Are taking medicines that weaken the immune system, such as transplant medicines.  Are experiencing a lot of stress. What are the signs or symptoms? Early symptoms of this condition include itching, tingling, and pain in an area on your skin. Pain may be described as burning, stabbing, or throbbing. A few days or weeks after early symptoms start, a painful red rash appears. The rash is usually on one side of the body and has a band-like or belt-like pattern. The rash eventually turns into  fluid-filled blisters that break open, change into scabs, and dry up in about 2-3 weeks. At any time during the infection, you may also develop:  A fever.  Chills.  A headache.  An upset stomach. How is this diagnosed? This condition is diagnosed with a skin exam. Skin or fluid samples may be taken from the blisters before a diagnosis is made. These samples are examined under a microscope or sent to a lab for testing. How is this treated? The rash may last for several weeks. There is not a specific cure for this condition. Your health care provider will probably prescribe medicines to help you manage pain, recover more quickly, and avoid long-term problems. Medicines may include:  Antiviral drugs.  Anti-inflammatory drugs.  Pain medicines.  Anti-itching medicines (antihistamines). If the area involved is on your face, you may be referred to a specialist, such as an eye doctor (ophthalmologist) or an ear, nose, and throat (ENT) doctor (otolaryngologist) to help you avoid eye problems, chronic pain, or disability. Follow these instructions at home: Medicines  Take over-the-counter and prescription medicines only as told by your health care provider.  Apply an anti-itch cream or numbing cream to the affected area as told by your health care provider. Relieving itching and discomfort   Apply cold, wet cloths (cold compresses) to the area of the rash or blisters as told by your health care provider.  Cool baths can be soothing. Try adding baking soda or dry oatmeal to the water to reduce itching. Do not bathe in  hot water. Blister and rash care  Keep your rash covered with a loose bandage (dressing). Wear loose-fitting clothing to help ease the pain of material rubbing against the rash.  Keep your rash and blisters clean by washing the area with mild soap and cool water as told by your health care provider.  Check your rash every day for signs of infection. Check for: ? More  redness, swelling, or pain. ? Fluid or blood. ? Warmth. ? Pus or a bad smell.  Do not scratch your rash or pick at your blisters. To help avoid scratching: ? Keep your fingernails clean and cut short. ? Wear gloves or mittens while you sleep, if scratching is a problem. General instructions  Rest as told by your health care provider.  Keep all follow-up visits as told by your health care provider. This is important.  Wash your hands often with soap and water. If soap and water are not available, use hand sanitizer. Doing this lowers your chance of getting a bacterial skin infection.  Before your blisters change into scabs, your shingles infection can cause chickenpox in people who have never had it or have never been vaccinated against it. To prevent this from happening, avoid contact with other people, especially: ? Babies. ? Pregnant women. ? Children who have eczema. ? Elderly people who have transplants. ? People who have chronic illnesses, such as cancer or AIDS. Contact a health care provider if:  Your pain is not relieved with prescribed medicines.  Your pain does not get better after the rash heals.  You have signs of infection in the rash area, such as: ? More redness, swelling, or pain around the rash. ? Fluid or blood coming from the rash. ? The rash area feeling warm to the touch. ? Pus or a bad smell coming from the rash. Get help right away if:  The rash is on your face or nose.  You have facial pain, pain around your eye area, or loss of feeling on one side of your face.  You have difficulty seeing.  You have ear pain or have ringing in your ear.  You have a loss of taste.  Your condition gets worse. Summary  Shingles, which is also known as herpes zoster, is an infection that causes a painful skin rash and fluid-filled blisters.  This condition is diagnosed with a skin exam. Skin or fluid samples may be taken from the blisters and examined before the  diagnosis is made.  Keep your rash covered with a loose bandage (dressing). Wear loose-fitting clothing to help ease the pain of material rubbing against the rash.  Before your blisters change into scabs, your shingles infection can cause chickenpox in people who have never had it or have never been vaccinated against it. This information is not intended to replace advice given to you by your health care provider. Make sure you discuss any questions you have with your health care provider. Document Released: 09/11/2005 Document Revised: 05/16/2017 Document Reviewed: 05/16/2017 Elsevier Interactive Patient Education  2019 [employer].

## 2019-01-29 NOTE — Progress Notes (Signed)
This visit type was conducted due to national recommendations for restrictions regarding the COVID-19 pandemic (e.g. social distancing).  This format is felt to be most appropriate for this patient at this time.  All issues noted in this document were discussed and addressed.  No physical exam was performed (except for noted visual exam findings with Video Visits). Virtual Visit via Video Note  I connected with@  on 01/29/19 at  3:00 PM EDT by a video enabled telemedicine application and verified that I am speaking with the correct person using two identifiers.  Location patient: home Location provider:work Persons participating in the virtual visit: patient, provider  I discussed the limitations of evaluation and management by telemedicine and the availability of in person appointments. The patient expressed understanding and agreed to proceed.   HPI: CC: right scapula blisters,  2 weeks, improved.   Had been having shoulder pain and 'crick in neck', had been using icyhot, bengay for 2 weeks until she realized that she blisters. No weakness or numbness in arms. Some drainage, unsure of color.  Notes working in the yard and 3 days later blisters presented. Blisters improved however pain is deep pain.  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   Thought it was poison oak at first, however notes lesions never spread nor were itchy. Used antibiotic OTC cream.   No f, n,v, pain with eating, abdominal pain. Feels fine other than the pain.   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  Had chicken pox as a child.   ROS: See pertinent positives and negatives per HPI.  Past Medical History:  Diagnosis Date  . Asthma   . Depression    bipolar  . Fainting spell    H/O  . H/O cardiac arrhythmia   . Migraines   . Seizures ([place])     Past Surgical History:  Procedure Laterality Date  . TUBAL LIGATION Bilateral 09/2002    Family History  Problem Relation  Age of Onset  . Hypertension Mother   . Diabetes Mother   . Arthritis Mother   . Heart disease Mother   . Breast cancer Neg Hx     SOCIAL HX: former smoker   Current Outpatient Medications:  .  albuterol (PROVENTIL HFA;VENTOLIN HFA) 108 (90 Base) MCG/ACT inhaler, Inhale 1-2 puffs into the lungs every 4 (four) hours as needed for wheezing or shortness of breath., Disp: 1 Inhaler, Rfl: 11 .  budesonide-formoterol (SYMBICORT) 160-4.5 MCG/ACT inhaler, Inhale 2 puffs into the lungs 2 (two) times daily. Rinse mouth, Disp: 1 Inhaler, Rfl: 12 .  dicyclomine (BENTYL) 10 MG/5ML syrup, Take 5 mLs by mouth at bedtime as needed., Disp: , Rfl:  .  famotidine (PEPCID) 20 MG tablet, Take 1 tablet (20 mg total) by mouth daily., Disp: 30 tablet, Rfl: 1 .  fluticasone (FLONASE) 50 MCG/ACT nasal spray, Place 2 sprays into both nostrils daily., Disp: 16 g, Rfl: 1 .  ipratropium-albuterol (DUONEB) 0.5-2.5 (3) MG/3ML SOLN, Take 3 mLs by nebulization every 4 (four) hours as needed., Disp: 360 mL, Rfl: 2 .  magnesium oxide (MAG-OX) 400 MG tablet, Take 1 tablet (400 mg total) by mouth daily., Disp: 30 tablet, Rfl: 1 .  nortriptyline (PAMELOR) 10 MG capsule, Take 10 mg by mouth 2 (two) times daily. Take two tablets at bedtime., Disp: , Rfl:  .  traZODone (DESYREL) 50 MG tablet, Take 50 mg by mouth at bedtime. 1-1 1-/2 tablets at bedtime., Disp: , Rfl:  .  butalbital-acetaminophen-caffeine (FIORICET, ESGIC) 50-325-40 MG tablet, Take 1 tablet by mouth 2 (two) times daily as needed for headache. (Patient not taking: Reported on 01/29/2019), Disp: 10 tablet, Rfl: 0 .  gabapentin (NEURONTIN) 100 MG capsule, Take 1 capsule (100 mg total) by mouth 3 (three) times daily., Disp: 30 capsule, Rfl: 0 .  valACYclovir (VALTREX) 1000 MG tablet, Take 1 tablet (1,000 mg total) by mouth 3 (three) times daily., Disp: 21 tablet, Rfl: 0  EXAM:  VITALS per patient if applicable:  GENERAL: alert, oriented, appears well and in no acute  distress  HEENT: atraumatic, conjunttiva clear, no obvious abnormalities on inspection of external nose and ears  NECK: normal movements of the head and neck  LUNGS: on inspection no signs of respiratory distress, breathing rate appears normal, no obvious gross SOB, gasping or wheezing  CV: no obvious cyanosis  MS: moves all visible extremities without noticeable abnormality  PSYCH/NEURO: pleasant and cooperative, no obvious depression or anxiety, speech and thought processing grossly intact  SKIN: 3 vesicles clustered together over right scapular area.  No obvious purulent discharge appreciated over video.  Lesions do not cross midline.  ASSESSMENT AND PLAN:  Discussed the following assessment and plan:  Herpes zoster without complication - Plan: valACYclovir (VALTREX) 1000 MG tablet, gabapentin (NEURONTIN) 100 MG capsule  Problem List Items Addressed This Visit      Other   Herpes zoster without complication - Primary    Presentation most consistent with herpes zoster.  Reasonable to trial Valtrex, gabapentin for pain.  Close follow-up.  Patient will let me know of any new or worsening symptoms      Relevant Medications   valACYclovir (VALTREX) 1000 MG tablet   gabapentin (NEURONTIN) 100 MG capsule        I discussed the assessment and treatment plan with the patient. The patient was provided an opportunity to ask questions and all were answered. The patient agreed with the plan and demonstrated an understanding of the instructions.   The patient was advised to call back or seek an in-person evaluation if the symptoms worsen or if the condition fails to improve as anticipated.   Mable Paris, FNP

## 2019-01-31 ENCOUNTER — Telehealth: Payer: Self-pay

## 2019-01-31 ENCOUNTER — Encounter: Payer: Self-pay | Admitting: Family

## 2019-01-31 NOTE — Progress Notes (Signed)
Printed and mailed

## 2019-01-31 NOTE — Telephone Encounter (Signed)
I can do follow up with her today if she wants  Or can increased the gabapentin like margaret suggested  Any new symptoms?

## 2019-01-31 NOTE — Telephone Encounter (Signed)
I called patient in regards to mychart message from today. I asked that she call back ASAP so we could try to schedule her for Doxy today at 3:30p.

## 2019-02-03 ENCOUNTER — Ambulatory Visit (INDEPENDENT_AMBULATORY_CARE_PROVIDER_SITE_OTHER): Payer: BLUE CROSS/BLUE SHIELD | Admitting: Family

## 2019-02-03 ENCOUNTER — Encounter: Payer: Self-pay | Admitting: Family

## 2019-02-03 ENCOUNTER — Other Ambulatory Visit: Payer: Self-pay

## 2019-02-03 DIAGNOSIS — B029 Zoster without complications: Secondary | ICD-10-CM

## 2019-02-03 MED ORDER — GABAPENTIN 100 MG PO CAPS: 200.0000 mg | ORAL_CAPSULE | Freq: Three times a day (TID) | ORAL | 1 refills | Status: DC

## 2019-02-03 NOTE — Patient Instructions (Addendum)
I suspect shingles and glad you have improved.   However as we discussed at length,I do have suspicion of carpal tunnel.  Please read the following and educate yourself to see if you think symptoms may agree.   Follow up in 2 weeks. Let us know how you are doing.    Carpal Tunnel Syndrome  Carpal tunnel syndrome is a condition that causes pain in your hand and arm. The carpal tunnel is a narrow area that is on the palm side of your wrist. Repeated wrist motion or certain diseases may cause swelling in the tunnel. This swelling can pinch the main nerve in the wrist (median nerve). What are the causes? This condition may be caused by:  Repeated wrist motions.  Wrist injuries.  Arthritis.  A sac of fluid (cyst) or abnormal growth (tumor) in the carpal tunnel.  Fluid buildup during pregnancy. Sometimes the cause is not known. What increases the risk? The following factors may make you more likely to develop this condition:  Having a job in which you move your wrist in the same way many times. This includes jobs like being a Software engineer or a Scientist, water quality.  Being a woman.  Having other health conditions, such as: ? Diabetes. ? Obesity. ? A thyroid gland that is not active enough (hypothyroidism). ? Kidney failure. What are the signs or symptoms? Symptoms of this condition include:  A tingling feeling in your fingers.  Tingling or a loss of feeling (numbness) in your hand.  Pain in your entire arm. This pain may get worse when you bend your wrist and elbow for a long time.  Pain in your wrist that goes up your arm to your shoulder.  Pain that goes down into your palm or fingers.  A weak feeling in your hands. You may find it hard to grab and hold items. You may feel worse at night. How is this diagnosed? This condition is diagnosed with a medical history and physical exam. You may also have tests, such as:  Electromyogram (EMG). This test checks the signals that the nerves send  to the muscles.  Nerve conduction study. This test checks how well signals pass through your nerves.  Imaging tests, such as X-rays, ultrasound, and MRI. These tests check for what might be the cause of your condition. How is this treated? This condition may be treated with:  Lifestyle changes. You will be asked to stop or change the activity that caused your problem.  Doing exercise and activities that make bones and muscles stronger (physical therapy).  Learning how to use your hand again (occupational therapy).  Medicines for pain and swelling (inflammation). You may have injections in your wrist.  A wrist splint.  Surgery. Follow these instructions at home: If you have a splint:  Wear the splint as told by your doctor. Remove it only as told by your doctor.  Loosen the splint if your fingers: ? Tingle. ? Lose feeling (become numb). ? Turn cold and blue.  Keep the splint clean.  If the splint is not waterproof: ? Do not let it get wet. ? Cover it with a watertight covering when you take a bath or a shower. Managing pain, stiffness, and swelling   If told, put ice on the painful area: ? If you have a removable splint, remove it as told by your doctor. ? Put ice in a plastic bag. ? Place a towel between your skin and the bag. ? Leave the ice on for 20  minutes, 2-3 times per day. General instructions  Take over-the-counter and prescription medicines only as told by your doctor.  Rest your wrist from any activity that may cause pain. If needed, talk with your boss at work about changes that can help your wrist heal.  Do any exercises as told by your doctor, physical therapist, or occupational therapist.  Keep all follow-up visits as told by your doctor. This is important. Contact a doctor if:  You have new symptoms.  Medicine does not help your pain.  Your symptoms get worse. Get help right away if:  You have very bad numbness or tingling in your wrist or  hand. Summary  Carpal tunnel syndrome is a condition that causes pain in your hand and arm.  It is often caused by repeated wrist motions.  Lifestyle changes and medicines are used to treat this problem. Surgery may help in very bad cases.  Follow your doctor's instructions about wearing a splint, resting your wrist, keeping follow-up visits, and calling for help. This information is not intended to replace advice given to you by your health care provider. Make sure you discuss any questions you have with your health care provider. Document Released: 08/31/2011 Document Revised: 01/18/2018 Document Reviewed: 01/18/2018 Elsevier Interactive Patient Education  2019 [employer].

## 2019-02-03 NOTE — Progress Notes (Signed)
This visit type was conducted due to national recommendations for restrictions regarding the COVID-19 pandemic (e.g. social distancing).  This format is felt to be most appropriate for this patient at this time.  All issues noted in this document were discussed and addressed.  No physical exam was performed (except for noted visual exam findings with Video Visits). Virtual Visit via Video Note  I connected with@  on 02/05/19 at  3:00 PM EDT by a video enabled telemedicine application and verified that I am speaking with the correct person using two identifiers.  Location patient: home Location provider:work Persons participating in the virtual visit: patient, provider  I discussed the limitations of evaluation and management by telemedicine and the availability of in person appointments. The patient expressed understanding and agreed to proceed.   HPI:  Feels well today. No new complaints.   Itching and 'achey' right arm pain improved. No numbness or tingling.  Had new blisters present proximal to right forearm couple of days ago, since have scabbed over. Lesions on right scapula have become scabs.   Right shoulder had ached, resolved.  Had intermittent right forearm,  right thumb and pointer finger pain, improved as well. NO weakness.   Works at [employer] 12 hour shift. Right handed.   Has been using capsaicin with relief.  One more day of valtrex.  Treated for suspected shingles 01/29/19 with gabapentin, valtrex Gabapentin 200mg  tid, improved with pain versus the 100mg  TID.   No fever, chills.   ROS: See pertinent positives and negatives per HPI.  Past Medical History:  Diagnosis Date  . Asthma   . Depression    bipolar  . Fainting spell    H/O  . H/O cardiac arrhythmia   . Migraines   . Seizures ([place])     Past Surgical History:  Procedure Laterality Date  . TUBAL LIGATION Bilateral 09/2002    Family History  Problem Relation Age of Onset  . Hypertension Mother   .  Diabetes Mother   . Arthritis Mother   . Heart disease Mother   . Breast cancer Neg Hx     SOCIAL HX: former smoker   Current Outpatient Medications:  .  albuterol (PROVENTIL HFA;VENTOLIN HFA) 108 (90 Base) MCG/ACT inhaler, Inhale 1-2 puffs into the lungs every 4 (four) hours as needed for wheezing or shortness of breath., Disp: 1 Inhaler, Rfl: 11 .  budesonide-formoterol (SYMBICORT) 160-4.5 MCG/ACT inhaler, Inhale 2 puffs into the lungs 2 (two) times daily. Rinse mouth, Disp: 1 Inhaler, Rfl: 12 .  butalbital-acetaminophen-caffeine (FIORICET, ESGIC) 50-325-40 MG tablet, Take 1 tablet by mouth 2 (two) times daily as needed for headache., Disp: 10 tablet, Rfl: 0 .  dicyclomine (BENTYL) 10 MG/5ML syrup, Take 5 mLs by mouth at bedtime as needed., Disp: , Rfl:  .  famotidine (PEPCID) 20 MG tablet, Take 1 tablet (20 mg total) by mouth daily., Disp: 30 tablet, Rfl: 1 .  fluticasone (FLONASE) 50 MCG/ACT nasal spray, Place 2 sprays into both nostrils daily., Disp: 16 g, Rfl: 1 .  gabapentin (NEURONTIN) 100 MG capsule, Take 2 capsules (200 mg total) by mouth 3 (three) times daily., Disp: 180 capsule, Rfl: 1 .  ipratropium-albuterol (DUONEB) 0.5-2.5 (3) MG/3ML SOLN, Take 3 mLs by nebulization every 4 (four) hours as needed., Disp: 360 mL, Rfl: 2 .  Magnesium 200 MG TABS, Take by mouth., Disp: , Rfl:  .  magnesium oxide (MAG-OX) 400 MG tablet, Take 1 tablet (400 mg total) by mouth daily., Disp: 30 tablet,  Rfl: 1 .  nortriptyline (PAMELOR) 10 MG capsule, Take 10 mg by mouth 2 (two) times daily. Take two tablets at bedtime., Disp: , Rfl:  .  traZODone (DESYREL) 50 MG tablet, Take 50 mg by mouth at bedtime. 1-1 1-/2 tablets at bedtime., Disp: , Rfl:  .  valACYclovir (VALTREX) 1000 MG tablet, Take 1 tablet (1,000 mg total) by mouth 3 (three) times daily., Disp: 21 tablet, Rfl: 0  EXAM:  VITALS per patient if applicable:  GENERAL: alert, oriented, appears well and in no acute distress  HEENT:  atraumatic, conjunttiva clear, no obvious abnormalities on inspection of external nose and ears  NECK: normal movements of the head and neck  LUNGS: on inspection no signs of respiratory distress, breathing rate appears normal, no obvious gross SOB, gasping or wheezing  CV: no obvious cyanosis  MS: moves all visible extremities without noticeable abnormality  PSYCH/NEURO: pleasant and cooperative, no obvious depression or anxiety, speech and thought processing grossly intact  ASSESSMENT AND PLAN:  Discussed the following assessment and plan:  Herpes zoster without complication - Plan: gabapentin (NEURONTIN) 100 MG capsule  Problem List Items Addressed This Visit      Other   Herpes zoster without complication    Pleased as patient overall improved.  Discussed again with limitations of telemedicine.  I discussed at length with her that symptoms in right hand raise a suspicion for perhaps carpal tunnel or radial impingement in addition to zoster, even cervical spine impingement.  We jointly agreed it is likely to soon to differentiate as she is still recovering from but we suspect for shingles.  Patient is agreeable to giving this a bit more time and to have provided her with education in regards to differential I am considering today .   she will let me know if no improvements we can pursue imaging, or consult in regards to any residual joint pain.      Relevant Medications   gabapentin (NEURONTIN) 100 MG capsule        I discussed the assessment and treatment plan with the patient. The patient was provided an opportunity to ask questions and all were answered. The patient agreed with the plan and demonstrated an understanding of the instructions.   The patient was advised to call back or seek an in-person evaluation if the symptoms worsen or if the condition fails to improve as anticipated.   Mable Paris, FNP

## 2019-02-05 ENCOUNTER — Encounter: Payer: Self-pay | Admitting: Family

## 2019-02-05 NOTE — Assessment & Plan Note (Signed)
Pleased as patient overall improved.  Discussed again with limitations of telemedicine.  I discussed at length with her that symptoms in right hand raise a suspicion for perhaps carpal tunnel or radial impingement in addition to zoster, even cervical spine impingement.  We jointly agreed it is likely to soon to differentiate as she is still recovering from but we suspect for shingles.  Patient is agreeable to giving this a bit more time and to have provided her with education in regards to differential I am considering today .   she will let me know if no improvements we can pursue imaging, or consult in regards to any residual joint pain.

## 2019-02-26 ENCOUNTER — Ambulatory Visit: Payer: BLUE CROSS/BLUE SHIELD | Admitting: Family

## 2019-02-27 ENCOUNTER — Ambulatory Visit (INDEPENDENT_AMBULATORY_CARE_PROVIDER_SITE_OTHER): Payer: BLUE CROSS/BLUE SHIELD | Admitting: Internal Medicine

## 2019-02-27 ENCOUNTER — Telehealth: Payer: Self-pay | Admitting: *Deleted

## 2019-02-27 ENCOUNTER — Encounter: Payer: Self-pay | Admitting: Internal Medicine

## 2019-02-27 ENCOUNTER — Other Ambulatory Visit: Payer: Self-pay

## 2019-02-27 DIAGNOSIS — B029 Zoster without complications: Secondary | ICD-10-CM | POA: Diagnosis not present

## 2019-02-27 DIAGNOSIS — K219 Gastro-esophageal reflux disease without esophagitis: Secondary | ICD-10-CM

## 2019-02-27 DIAGNOSIS — G43809 Other migraine, not intractable, without status migrainosus: Secondary | ICD-10-CM | POA: Diagnosis not present

## 2019-02-27 DIAGNOSIS — G40909 Epilepsy, unspecified, not intractable, without status epilepticus: Secondary | ICD-10-CM

## 2019-02-27 DIAGNOSIS — J4520 Mild intermittent asthma, uncomplicated: Secondary | ICD-10-CM | POA: Diagnosis not present

## 2019-02-27 NOTE — Progress Notes (Signed)
Patient ID: Heather [name], female   DOB: 1976/03/07, 43 y.o.   MRN: [id number]   Virtual Visit via video Note  This visit type was conducted due to national recommendations for restrictions regarding the COVID-19 pandemic (e.g. social distancing).  This format is felt to be most appropriate for this patient at this time.  All issues noted in this document were discussed and addressed.  No physical exam was performed (except for noted visual exam findings with Video Visits).   I connected with [name] by a video enabled telemedicine application and verified that I am speaking with the correct person using two identifiers. Location patient: home Location provider: work Persons participating in the virtual visit: patient, provider  I discussed the limitations, risks, security and privacy concerns of performing an evaluation and management service by video and the availability of in person appointments. The patient expressed understanding and agreed to proceed.   Reason for visit: scheduled follow up.   HPI: She reports she is doing well.  Feeling better.  Has a history of asthma and allergies.  Flares intermittently.  Recently treated with prednisone (two rounds).  Breathing much improved.  Did notice over the last couple of days, some allergy symptoms.  Reports some nasal congestion, drainage.  No fever.  Felt a little change last night in her breathing.  States this is typical feeling when her allergies start to flare.  Used her nebulizer x 2 and is taking antihistamine and feels fine now.  No fever.  No sinus pressure or chest tightness.  No sob.  No acid reflux.  No abdominal pain.  Bowels moving.  Diagnosed with herpes zoster recently.  Treated. Resolved.  Seeing Dr Melrose Nakayama for her headaches.  Started on nortriptyline.  Headaches are better.  Trazodone did not help.  Plans to f/u with neurology 03/10/19.  States someone at her work was diagnosed with [place].  She is unsure if she had any contact  with the employee.  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  Trying to stay in - other than her work.  No fever.  No chest congestion or sob.  No nausea or vomiting.     ROS: See pertinent positives and negatives per HPI.  Past Medical History:  Diagnosis Date  . Asthma   . Depression    bipolar  . Fainting spell    H/O  . H/O cardiac arrhythmia   . Migraines   . Seizures ([place])     Past Surgical History:  Procedure Laterality Date  . TUBAL LIGATION Bilateral 09/2002    Family History  Problem Relation Age of Onset  . Hypertension Mother   . Diabetes Mother   . Arthritis Mother   . Heart disease Mother   . Breast cancer Neg Hx     SOCIAL HX: reviewed.    Current Outpatient Medications:  .  nortriptyline (PAMELOR) 10 MG capsule, Take 30 mg nightly for two weeks then increase to 40 mg at night and continue that dose, Disp: , Rfl:  .  albuterol (PROVENTIL HFA;VENTOLIN HFA) 108 (90 Base) MCG/ACT inhaler, Inhale 1-2 puffs into the lungs every 4 (four) hours as needed for wheezing or shortness of breath., Disp: 1 Inhaler, Rfl: 11 .  budesonide-formoterol (SYMBICORT) 160-4.5 MCG/ACT inhaler, Inhale 2 puffs into the lungs 2 (two) times daily. Rinse mouth, Disp: 1 Inhaler, Rfl: 12 .  diazepam (VALIUM) 10 MG tablet, TAKE 1 TABLET BY MOUTH 4 TIMES DAILY  AS NEEDED, Disp: , Rfl:  .  dicyclomine (BENTYL) 10 MG/5ML syrup, Take 5 mLs by mouth at bedtime as needed., Disp: , Rfl:  .  famotidine (PEPCID) 20 MG tablet, Take 1 tablet (20 mg total) by mouth daily., Disp: 30 tablet, Rfl: 1 .  fluticasone (FLONASE) 50 MCG/ACT nasal spray, Place 2 sprays into both nostrils daily., Disp: 16 g, Rfl: 1 .  ipratropium-albuterol (DUONEB) 0.5-2.5 (3) MG/3ML SOLN, Take 3 mLs by nebulization every 4 (four) hours as needed., Disp: 360 mL, Rfl: 2 .  Magnesium 200 MG TABS, Take by mouth., Disp: , Rfl:  .  QUEtiapine (SEROQUEL XR) 300 MG 24 hr tablet, Take 600 mg by mouth at  bedtime., Disp: , Rfl:   EXAM:  GENERAL: alert, oriented, appears well and in no acute distress  HEENT: atraumatic, conjunttiva clear, no obvious abnormalities on inspection of external nose and ears  NECK: normal movements of the head and neck  LUNGS: on inspection no signs of respiratory distress, breathing rate appears normal, no obvious gross SOB, gasping or wheezing  CV: no obvious cyanosis  PSYCH/NEURO: pleasant and cooperative, no obvious depression or anxiety, speech and thought processing grossly intact  ASSESSMENT AND PLAN:  Discussed the following assessment and plan:  Mild intermittent asthma without complication  Gastroesophageal reflux disease, esophagitis presence not specified  Herpes zoster without complication  Other migraine without status migrainosus, not intractable  Seizure disorder (HCC)  Asthma Overall much improved.  Some allergy symptoms currently as outlined.  Works at [employer] center. Someone at her work tested positive.  She has no idea if she has come in contact with the person.  She works in an office by herself.  Wears a mask.  No fever.  No chest congestion or sob.  This feels like her typical allergy symptoms.  Discussed taking her antihistamine, using her inhalers and monitoring symptoms closely.  Will continue to wear a mask, wash hands, etc.  Any progression of symptoms she is to notify me for evaluation.  Discussed importance of social distancing and quarantine.    GERD (gastroesophageal reflux disease) Appears to be controlled.    Herpes zoster without complication Recently diagnosed.  Off gabapentin.  Does not feel needs.  Follow.    Migraine Seeing Dr Melrose Nakayama.  Headaches are better.  Follow.    Seizure disorder Seeing neurology.  F/u EEG.     I discussed the assessment and treatment plan with the patient. The patient was provided an opportunity to ask questions and all were answered. The patient agreed with the plan and  demonstrated an understanding of the instructions.   The patient was advised to call back or seek an in-person evaluation if the symptoms worsen or if the condition fails to improve as anticipated.    Einar Pheasant, MD

## 2019-02-27 NOTE — Telephone Encounter (Signed)
Pt had visit today.

## 2019-02-27 NOTE — Telephone Encounter (Signed)
Copied from [place] [id number]. Topic: Appointment Scheduling - Scheduling Inquiry for Clinic >> Feb 26, 2019  4:43 PM Erick Blinks wrote: Reason for CRM: Pt called back to provide phone number for virtual visit tomorrow. Listed under appt notes

## 2019-03-02 ENCOUNTER — Encounter: Payer: Self-pay | Admitting: Internal Medicine

## 2019-03-02 NOTE — Assessment & Plan Note (Signed)
Seeing Dr Melrose Nakayama.  Headaches are better.  Follow.

## 2019-03-02 NOTE — Assessment & Plan Note (Signed)
Overall much improved.  Some allergy symptoms currently as outlined.  Works at [employer]. Someone at her work tested positive.  She has no idea if she has come in contact with the person.  She works in an office by herself.  Wears a mask.  No fever.  No chest congestion or sob.  This feels like her typical allergy symptoms.  Discussed taking her antihistamine, using her inhalers and monitoring symptoms closely.  Will continue to wear a mask, wash hands, etc.  Any progression of symptoms she is to notify me for evaluation.  Discussed importance of social distancing and quarantine.

## 2019-03-02 NOTE — Assessment & Plan Note (Signed)
Appears to be controlled.  

## 2019-03-02 NOTE — Assessment & Plan Note (Signed)
Recently diagnosed.  Off gabapentin.  Does not feel needs.  Follow.

## 2019-03-02 NOTE — Assessment & Plan Note (Signed)
Seeing neurology.  F/u EEG.

## 2019-03-11 ENCOUNTER — Encounter: Payer: Self-pay | Admitting: Internal Medicine

## 2019-03-11 DIAGNOSIS — L259 Unspecified contact dermatitis, unspecified cause: Secondary | ICD-10-CM

## 2019-03-12 ENCOUNTER — Telehealth: Payer: Self-pay | Admitting: Internal Medicine

## 2019-03-12 MED ORDER — TRIAMCINOLONE ACETONIDE 0.1 % EX CREA: 1.0000 "application " | TOPICAL_CREAM | Freq: Two times a day (BID) | CUTANEOUS | 0 refills | Status: DC

## 2019-03-12 NOTE — Telephone Encounter (Signed)
TC from patient. Reviewed Dr.Scott's message with her. Spoke with Kerin Salen at office and she will send prescription to pharmacy at this time. No fever/SOB. Denies any other symptoms.

## 2019-03-12 NOTE — Telephone Encounter (Signed)
I reviewed the picture she sent.  May be contact dermatitis (possible poison oak, etc).  I can only see the one area on the picture.  Not sure if more.  Previously treated for shingles.  Given is itching,etc, I can send in steroid cream for her to apply topically bid. (Triamcinolone cream .1% - apply to affected area bid.  Avoid face, breast and genitalia area).  If problems, needs to let us know.  Please confirm no other symptoms, fever, sob, etc.

## 2019-03-12 NOTE — Telephone Encounter (Signed)
Tried to reach patient by phone no answer left voicemail to call office.  

## 2019-03-13 NOTE — Telephone Encounter (Signed)
Medication sent patient called back and spoke with RN at [facility] medication ordered by me through [facility].

## 2019-03-16 ENCOUNTER — Encounter: Payer: Self-pay | Admitting: Internal Medicine

## 2019-03-16 DIAGNOSIS — G479 Sleep disorder, unspecified: Secondary | ICD-10-CM | POA: Insufficient documentation

## 2019-04-11 ENCOUNTER — Other Ambulatory Visit: Payer: Self-pay | Admitting: Internal Medicine

## 2019-05-11 ENCOUNTER — Other Ambulatory Visit: Payer: Self-pay | Admitting: Internal Medicine

## 2019-05-19 ENCOUNTER — Ambulatory Visit (INDEPENDENT_AMBULATORY_CARE_PROVIDER_SITE_OTHER): Payer: BC Managed Care – PPO | Admitting: Family Medicine

## 2019-05-19 ENCOUNTER — Other Ambulatory Visit: Payer: Self-pay

## 2019-05-19 DIAGNOSIS — J4541 Moderate persistent asthma with (acute) exacerbation: Secondary | ICD-10-CM

## 2019-05-19 DIAGNOSIS — R058 Other specified cough: Secondary | ICD-10-CM

## 2019-05-19 DIAGNOSIS — R05 Cough: Secondary | ICD-10-CM

## 2019-05-19 MED ORDER — DOXYCYCLINE HYCLATE 100 MG PO TABS: 100.0000 mg | ORAL_TABLET | Freq: Two times a day (BID) | ORAL | 0 refills | Status: DC

## 2019-05-19 MED ORDER — PREDNISONE 10 MG (21) PO TBPK: ORAL_TABLET | ORAL | 0 refills | Status: DC

## 2019-05-19 NOTE — Progress Notes (Signed)
Patient ID: Heather [name], female   DOB: May 02, 1976, 43 y.o.   MRN: [id number]    Virtual Visit via video Note  This visit type was conducted due to national recommendations for restrictions regarding the COVID-19 pandemic (e.g. social distancing).  This format is felt to be most appropriate for this patient at this time.  All issues noted in this document were discussed and addressed.  No physical exam was performed (except for noted visual exam findings with Video Visits).   I connected with Heather [name] today at  8:20 AM EDT by a video enabled telemedicine application and verified that I am speaking with the correct person using two identifiers. Location patient: home Location provider: work or home office Persons participating in the virtual visit: patient, provider  I discussed the limitations, risks, security and privacy concerns of performing an evaluation and management service by video and the availability of in person appointments. I also discussed with the patient that there may be a patient responsible charge related to this service. The patient expressed understanding and agreed to proceed.   HPI:  Patient and I connected via video due to cough, nasal congestion, sinus pressure and chest congestion.  Patient states her symptoms have been present for almost 10 days.  She has a history of asthma with exacerbations especially if her sinuses do become congested.  She has been taking Robitussin which does help to thin mucus and break up phlegm.  Is using either albuterol inhaler or albuterol nebulizer about every 4 hours to call keep her lungs open.  Does feel that the Robitussin is helping to break up the phlegm.  No fever or chills.  No body aches.  No chest pain.  Some shortness of breath and wheezing, but nothing more than her usual with this type of asthma exacerbation that she has had previously.  Appetite is normal.  No nausea, vomiting or diarrhea.  States she has not been around  anyone that she knows of who has tested positive for COVID-19 or who is under suspicion for COVID-19, and would prefer not to have to go and be tested.    ROS: See pertinent positives and negatives per HPI.  Past Medical History:  Diagnosis Date  . Asthma   . Depression    bipolar  . Fainting spell    H/O  . H/O cardiac arrhythmia   . Migraines   . Seizures ([place])     Past Surgical History:  Procedure Laterality Date  . TUBAL LIGATION Bilateral 09/2002    Family History  Problem Relation Age of Onset  . Hypertension Mother   . Diabetes Mother   . Arthritis Mother   . Heart disease Mother   . Breast cancer Neg Hx    Social History   Tobacco Use  . Smoking status: Former Smoker    Packs/day: 0.50    Types: Cigarettes  . Smokeless tobacco: Never Used  . Tobacco comment: Quit mid October 2014  Substance Use Topics  . Alcohol use: No    Alcohol/week: 0.0 standard drinks    Current Outpatient Medications:  .  albuterol (PROVENTIL HFA;VENTOLIN HFA) 108 (90 Base) MCG/ACT inhaler, Inhale 1-2 puffs into the lungs every 4 (four) hours as needed for wheezing or shortness of breath., Disp: 1 Inhaler, Rfl: 11 .  budesonide-formoterol (SYMBICORT) 160-4.5 MCG/ACT inhaler, Inhale 2 puffs into the lungs 2 (two) times daily. Rinse mouth, Disp: 1 Inhaler, Rfl: 12 .  diazepam (VALIUM) 10 MG tablet, TAKE 1  TABLET BY MOUTH 4 TIMES DAILY AS NEEDED, Disp: , Rfl:  .  dicyclomine (BENTYL) 10 MG/5ML syrup, Take 5 mLs by mouth at bedtime as needed., Disp: , Rfl:  .  famotidine (PEPCID) 20 MG tablet, Take 1 tablet (20 mg total) by mouth daily., Disp: 30 tablet, Rfl: 1 .  fluticasone (FLONASE) 50 MCG/ACT nasal spray, Place 2 sprays into both nostrils daily., Disp: 16 g, Rfl: 1 .  ipratropium-albuterol (DUONEB) 0.5-2.5 (3) MG/3ML SOLN, USE 1 AMPULE IN NEBULIZER EVERY 4 HOURS AS NEEDED, Disp: 360 mL, Rfl: 0 .  Magnesium 200 MG TABS, Take by mouth., Disp: , Rfl:  .  nortriptyline (PAMELOR) 10 MG  capsule, Take 30 mg nightly for two weeks then increase to 40 mg at night and continue that dose, Disp: , Rfl:  .  QUEtiapine (SEROQUEL XR) 300 MG 24 hr tablet, Take 600 mg by mouth at bedtime., Disp: , Rfl:  .  triamcinolone cream (KENALOG) 0.1 %, Apply 1 application topically 2 (two) times daily., Disp: 30 g, Rfl: 0  EXAM:  GENERAL: alert, oriented, appears well and in no acute distress  HEENT: atraumatic, conjunttiva clear, no obvious abnormalities on inspection of external nose and ears  NECK: normal movements of the head and neck  LUNGS: on inspection no signs of respiratory distress, breathing rate appears normal, no obvious gross SOB, gasping or wheezing  CV: no obvious cyanosis  MS: moves all visible extremities without noticeable abnormality  PSYCH/NEURO: pleasant and cooperative, no obvious depression or anxiety, speech and thought processing grossly intact  ASSESSMENT AND PLAN:  Discussed the following assessment and plan:  Asthma exacerbation, cough productive of purulent sputum - patient will continue Robitussin to break up phlegm and congestion, she will continue albuterol every 4-6 hours as needed to help keep her lungs open.  We will cover with doxycycline course to treat respiratory infection and she will take high-dose oral steroid taper down.  Advised to get plenty of rest, drink lots of fluids, do good handwashing and remain home throughout this week to allow body to rest and recover.  Advised if she is not feeling better by the end of this week to let us know.  Advised if symptoms worsen in any way including shortness of breath, wheezing or worsening of any of her symptoms to call office and or go to ER right away for evaluation.   I discussed the assessment and treatment plan with the patient. The patient was provided an opportunity to ask questions and all were answered. The patient agreed with the plan and demonstrated an understanding of the instructions.   The  patient was advised to call back or seek an in-person evaluation if the symptoms worsen or if the condition fails to improve as anticipated.  Jodelle Green, FNP

## 2019-05-22 ENCOUNTER — Encounter: Payer: Self-pay | Admitting: Internal Medicine

## 2019-05-22 ENCOUNTER — Encounter: Payer: Self-pay | Admitting: Family Medicine

## 2019-05-22 DIAGNOSIS — R058 Other specified cough: Secondary | ICD-10-CM

## 2019-05-22 DIAGNOSIS — R05 Cough: Secondary | ICD-10-CM

## 2019-05-22 DIAGNOSIS — J4541 Moderate persistent asthma with (acute) exacerbation: Secondary | ICD-10-CM

## 2019-05-22 MED ORDER — PREDNISONE 20 MG PO TABS: ORAL_TABLET | ORAL | 0 refills | Status: DC

## 2019-05-22 MED ORDER — LEVOFLOXACIN 500 MG PO TABS: 500.0000 mg | ORAL_TABLET | Freq: Every day | ORAL | 0 refills | Status: DC

## 2019-05-22 NOTE — Addendum Note (Signed)
Addended by: Philis Nettle on: 05/22/2019 12:16 PM   Modules accepted: Orders

## 2019-05-23 NOTE — Telephone Encounter (Signed)
Pt dropped off FMLA paperwork. Placed in Dr. Bary Leriche color folder upfront

## 2019-05-26 NOTE — Telephone Encounter (Signed)
Paperwork is in form folder for you.

## 2019-05-26 NOTE — Telephone Encounter (Signed)
Is this just for her to be off for intermittent appts.  Just need to clarify exactly what is needed.

## 2019-05-27 ENCOUNTER — Encounter: Payer: Self-pay | Admitting: Internal Medicine

## 2019-05-27 DIAGNOSIS — Z0279 Encounter for issue of other medical certificate: Secondary | ICD-10-CM

## 2019-05-27 NOTE — Telephone Encounter (Signed)
Form completed.  Placed in box.  Need to make a copy for our records.

## 2019-05-27 NOTE — Telephone Encounter (Signed)
Form completed.  See other my chart note.  Placed in box.

## 2019-05-27 NOTE — Telephone Encounter (Signed)
She needs intermittent leave for doctors appointments and if she is out of work sick due to chronic issues (her asthma or migraines)

## 2019-06-04 ENCOUNTER — Encounter: Payer: Self-pay | Admitting: Family Medicine

## 2019-06-04 DIAGNOSIS — R058 Other specified cough: Secondary | ICD-10-CM

## 2019-06-04 DIAGNOSIS — R05 Cough: Secondary | ICD-10-CM

## 2019-06-04 DIAGNOSIS — J4541 Moderate persistent asthma with (acute) exacerbation: Secondary | ICD-10-CM

## 2019-06-04 MED ORDER — PREDNISONE 20 MG PO TABS: ORAL_TABLET | ORAL | 0 refills | Status: DC

## 2019-06-04 MED ORDER — LEVOFLOXACIN 500 MG PO TABS: 500.0000 mg | ORAL_TABLET | Freq: Every day | ORAL | 0 refills | Status: DC

## 2019-06-11 ENCOUNTER — Other Ambulatory Visit: Payer: Self-pay | Admitting: Internal Medicine

## 2019-07-05 ENCOUNTER — Encounter: Payer: Self-pay | Admitting: Internal Medicine

## 2019-07-08 NOTE — Telephone Encounter (Signed)
Talked with Heather [name] about this. She had recent visit with Heather [name] discussing her asthma. Are you okay with me giving her a neb machine from our offi

## 2019-07-08 NOTE — Telephone Encounter (Signed)
Yes, I am ok with her having a machine.  Confirm pt doing ok.

## 2019-07-08 NOTE — Telephone Encounter (Signed)
Office

## 2019-07-11 ENCOUNTER — Other Ambulatory Visit: Payer: Self-pay | Admitting: Internal Medicine

## 2019-07-31 ENCOUNTER — Ambulatory Visit: Payer: Self-pay | Admitting: Internal Medicine

## 2019-07-31 ENCOUNTER — Ambulatory Visit (INDEPENDENT_AMBULATORY_CARE_PROVIDER_SITE_OTHER): Payer: BC Managed Care – PPO | Admitting: Family Medicine

## 2019-07-31 ENCOUNTER — Other Ambulatory Visit: Payer: Self-pay

## 2019-07-31 ENCOUNTER — Encounter: Payer: Self-pay | Admitting: Family Medicine

## 2019-07-31 ENCOUNTER — Telehealth: Payer: Self-pay | Admitting: Internal Medicine

## 2019-07-31 VITALS — Ht 64.0 in | Wt 174.0 lb

## 2019-07-31 DIAGNOSIS — J4541 Moderate persistent asthma with (acute) exacerbation: Secondary | ICD-10-CM | POA: Diagnosis not present

## 2019-07-31 DIAGNOSIS — J988 Other specified respiratory disorders: Secondary | ICD-10-CM

## 2019-07-31 MED ORDER — PREDNISONE 20 MG PO TABS: ORAL_TABLET | ORAL | 0 refills | Status: DC

## 2019-07-31 MED ORDER — AZITHROMYCIN 250 MG PO TABS: ORAL_TABLET | ORAL | 0 refills | Status: DC

## 2019-07-31 MED ORDER — CETIRIZINE HCL 10 MG PO TABS: 10.0000 mg | ORAL_TABLET | Freq: Every day | ORAL | 3 refills | Status: DC

## 2019-07-31 MED ORDER — MONTELUKAST SODIUM 10 MG PO TABS: 10.0000 mg | ORAL_TABLET | Freq: Every day | ORAL | 3 refills | Status: DC

## 2019-07-31 NOTE — Telephone Encounter (Signed)
Pt reports ongoing issues with asthma and "Seasonal allergies." On several rounds of ATBs and steroids past few months; see previous encounters. States past week ( over weekend) symptoms worsened; increased cough, increased "Phelgm and drainage." Reports chest tightness, states nebs and inhaler help but using nebs more often, 4-5 times a day. Reports sinus pressure, some tenderness. Denies fever, no known exposure to positive covid. Call transferred to practice, [facility], for consideration of virtual appt today. Care advise given to pt per protocol, verbalizes understanding.  Reason for Disposition . [1] Continuous (nonstop) coughing interferes with work or school AND [2] no improvement using cough treatment per protocol  Answer Assessment - Initial Assessment Questions 1. ONSET: "When did the cough begin?"      H/O allergies and asthma, worsening past weekend 2. SEVERITY: "How bad is the cough today?"      severe 3. RESPIRATORY DISTRESS: "Describe your breathing."      Wheezing at times 4. FEVER: "Do you have a fever?" If so, ask: "What is your temperature, how was it measured, and when did it start?"     no 5. SPUTUM: "Describe the color of your sputum" (e.g., clear, white, yellow, green), "Has there been any change recently?"     In morning thick clear, during day greenish at times 6. HEMOPTYSIS: "Are you coughing up any blood?" If so ask: "How much, flecks, streaks, tablespoons, etc.?"     no 7. CARDIAC HISTORY: "Do you have any history of heart disease?" (e.g., heart attack, congestive heart failure)      no 8. LUNG HISTORY: "Do you have any history of lung disease?"  (e.g., pulmonary embolus, asthma, emphysema/COPD)     Asthma, seasonal allergies 9. OTHER SYMPTOMS: "Do you have any other symptoms? (e.g., runny nose, wheezing, chest pain)     Chest tightness, sinus tenderness, pressure  Protocols used: COUGH - CHRONIC-A-AH

## 2019-07-31 NOTE — Progress Notes (Signed)
Patient ID: Heather [name], female   DOB: 1976-08-08, 43 y.o.   MRN: [id number]    Virtual Visit via video Note  This visit type was conducted due to national recommendations for restrictions regarding the COVID-19 pandemic (e.g. social distancing).  This format is felt to be most appropriate for this patient at this time.  All issues noted in this document were discussed and addressed.  No physical exam was performed (except for noted visual exam findings with Video Visits).   I connected with Heather [name] today at  2:20 PM EST by a video enabled telemedicine application or telephone and verified that I am speaking with the correct person using two identifiers. Location patient: home Location provider: work or home office Persons participating in the virtual visit: patient, provider  I discussed the limitations, risks, security and privacy concerns of performing an evaluation and management service by video and the availability of in person appointments. I also discussed with the patient that there may be a patient responsible charge related to this service. The patient expressed understanding and agreed to proceed.  HPI:  Patient and I connected via video due to complaints of cough, congestion, shortness of breath and concerns for asthma flaring up.  Patient has long history of asthma that seems to be triggered by allergies.  Patient tends to have difficulty at the end of summer and into the fall.  Currently she takes Zyrtec in the morning, but otherwise no other regular allergy medicines.  She does take Symbicort twice a day and has albuterol inhaler as needed as well as DuoNeb nebulizers as needed.  She is starting doing her DuoNeb nebulizers at least twice a day over the past week.  Is coughing up some phlegm and feels tight congested in the chest.  Does not leave her house often, always wears a mask when leaves the home and denies any concerns for COVID-19.  Denies fever or chills.  Denies  vomiting or diarrhea.  Denies body aches.   ROS: See pertinent positives and negatives per HPI.  Past Medical History:  Diagnosis Date  . Asthma   . Depression    bipolar  . Fainting spell    H/O  . H/O cardiac arrhythmia   . Migraines   . Seizures ([place])     Past Surgical History:  Procedure Laterality Date  . TUBAL LIGATION Bilateral 09/2002    Family History  Problem Relation Age of Onset  . Hypertension Mother   . Diabetes Mother   . Arthritis Mother   . Heart disease Mother   . Breast cancer Neg Hx    Social History   Tobacco Use  . Smoking status: Former Smoker    Packs/day: 0.50    Types: Cigarettes  . Smokeless tobacco: Never Used  . Tobacco comment: Quit mid October 2014  Substance Use Topics  . Alcohol use: No    Alcohol/week: 0.0 standard drinks    Current Outpatient Medications:  .  albuterol (PROVENTIL HFA;VENTOLIN HFA) 108 (90 Base) MCG/ACT inhaler, Inhale 1-2 puffs into the lungs every 4 (four) hours as needed for wheezing or shortness of breath., Disp: 1 Inhaler, Rfl: 11 .  budesonide-formoterol (SYMBICORT) 160-4.5 MCG/ACT inhaler, Inhale 2 puffs into the lungs 2 (two) times daily. Rinse mouth, Disp: 1 Inhaler, Rfl: 12 .  diazepam (VALIUM) 10 MG tablet, TAKE 1 TABLET BY MOUTH 4 TIMES DAILY AS NEEDED, Disp: , Rfl:  .  dicyclomine (BENTYL) 10 MG/5ML syrup, Take 5 mLs by mouth  at bedtime as needed., Disp: , Rfl:  .  doxycycline (VIBRA-TABS) 100 MG tablet, Take 1 tablet (100 mg total) by mouth 2 (two) times daily., Disp: 20 tablet, Rfl: 0 .  famotidine (PEPCID) 20 MG tablet, Take 1 tablet (20 mg total) by mouth daily., Disp: 30 tablet, Rfl: 1 .  fluticasone (FLONASE) 50 MCG/ACT nasal spray, Place 2 sprays into both nostrils daily., Disp: 16 g, Rfl: 1 .  ipratropium-albuterol (DUONEB) 0.5-2.5 (3) MG/3ML SOLN, USE 1 AMPULE IN NEBULIZER EVERY 4 HOURS AS NEEDED, Disp: 360 mL, Rfl: 0 .  levofloxacin (LEVAQUIN) 500 MG tablet, Take 1 tablet (500 mg total) by  mouth daily., Disp: 7 tablet, Rfl: 0 .  Magnesium 200 MG TABS, Take by mouth., Disp: , Rfl:  .  nortriptyline (PAMELOR) 10 MG capsule, Take 30 mg nightly for two weeks then increase to 40 mg at night and continue that dose, Disp: , Rfl:  .  predniSONE (DELTASONE) 20 MG tablet, Take 4 tablets by mouth for 4 days, then take 3 tablets by mouth for 3 days, then take 2 tablets by mouth for 2 days, then take 1 tablet by mouth for 2 days, then take 1/2 tablet by mouth for 2 days and stop., Disp: 28 tablet, Rfl: 0 .  QUEtiapine (SEROQUEL XR) 300 MG 24 hr tablet, Take 600 mg by mouth at bedtime., Disp: , Rfl:  .  triamcinolone cream (KENALOG) 0.1 %, Apply 1 application topically 2 (two) times daily., Disp: 30 g, Rfl: 0  EXAM:  GENERAL: alert, oriented, appears well and in no acute distress  HEENT: atraumatic, conjunttiva clear, no obvious abnormalities on inspection of external nose and ears  NECK: normal movements of the head and neck  LUNGS: on inspection no signs of respiratory distress, breathing rate appears normal, no obvious gross SOB, gasping or wheezing. +moist cough while we are on video call.   CV: no obvious cyanosis  MS: moves all visible extremities without noticeable abnormality  PSYCH/NEURO: pleasant and cooperative, no obvious depression or anxiety, speech and thought processing grossly intact  ASSESSMENT AND PLAN:  Discussed the following assessment and plan:  Respiratory infection - Plan: azithromycin (ZITHROMAX) 250 MG tablet  Moderate persistent asthma with exacerbation - Plan: azithromycin (ZITHROMAX) 250 MG tablet, predniSONE (DELTASONE) 20 MG tablet, montelukast (SINGULAIR) 10 MG tablet, cetirizine (ZYRTEC) 10 MG tablet  Suspect patient is having asthma exacerbation triggered by seasonal allergies.  She will do azithromycin course and high-dose steroid course.  She will continue her regular inhalers as well as continue DuoNeb treatments at least 2 times a day for the  next week.  She will use Mucinex tablets to help improve cough.  She will remain home until she is feeling better.  Patient declines being tested for COVID-19, states she does not feel it is Covid as it feels exactly like her asthma flaring up.  Encourage patient to reconsider being tested just to be on safe side, declines COVID-19 testing again.  We will also add on Singulair at bedtime on top of Zyrtec in the morning to help better control her allergy symptoms overall.   I discussed the assessment and treatment plan with the patient. The patient was provided an opportunity to ask questions and all were answered. The patient agreed with the plan and demonstrated an understanding of the instructions.   The patient was advised to call back or seek an in-person evaluation if the symptoms worsen or if the condition fails to improve as anticipated.  Jodelle Green, FNP

## 2019-07-31 NOTE — Telephone Encounter (Signed)
Could not attache this to note from [facility] ,triaged. Patient is scheduled with [facility] for Virtual today at 2:20pm.

## 2019-07-31 NOTE — Telephone Encounter (Signed)
Scheduled with NP at 2 today.

## 2019-08-05 ENCOUNTER — Encounter: Payer: Self-pay | Admitting: Family Medicine

## 2019-08-05 ENCOUNTER — Telehealth: Payer: Self-pay | Admitting: Internal Medicine

## 2019-08-05 DIAGNOSIS — J4541 Moderate persistent asthma with (acute) exacerbation: Secondary | ICD-10-CM

## 2019-08-05 MED ORDER — PREDNISONE 20 MG PO TABS: ORAL_TABLET | ORAL | 0 refills | Status: DC

## 2019-08-05 NOTE — Telephone Encounter (Signed)
Pharm is calling and pt will need additional 4 tablet of prednisone 20 mg to complete 13 day taper

## 2019-08-05 NOTE — Telephone Encounter (Signed)
Patient notified that prednisone was sent in. Both taper & another round of prednisone.

## 2019-08-05 NOTE — Telephone Encounter (Signed)
Yes that is fine for the 4 tablet to complete the taper

## 2019-08-05 NOTE — Telephone Encounter (Signed)
Prednisone was sent in by you recently. Ok to send in the other 4 tablets?

## 2019-08-15 ENCOUNTER — Other Ambulatory Visit: Payer: Self-pay | Admitting: Internal Medicine

## 2019-09-07 ENCOUNTER — Other Ambulatory Visit: Payer: Self-pay | Admitting: Internal Medicine

## 2019-09-11 ENCOUNTER — Encounter: Payer: Self-pay | Admitting: Internal Medicine

## 2019-09-11 ENCOUNTER — Encounter: Payer: Self-pay | Admitting: Family

## 2019-09-11 NOTE — Telephone Encounter (Signed)
Called pt, unable to leave message.

## 2019-09-11 NOTE — Telephone Encounter (Signed)
Please call pt.  Reviewed her chart.  She has had multiple rounds of prednisone and abx recently - in the last few months.  If she is continuing to have issues, she does need an evaluation.  She also needs pulmonary referral.

## 2019-09-12 ENCOUNTER — Telehealth: Payer: Self-pay | Admitting: Internal Medicine

## 2019-09-12 ENCOUNTER — Encounter: Payer: Self-pay | Admitting: Internal Medicine

## 2019-09-12 ENCOUNTER — Ambulatory Visit
Admission: EM | Admit: 2019-09-12 | Discharge: 2019-09-12 | Disposition: A | Discharge status: Home / Self Care | Payer: BC Managed Care – PPO | Attending: Nurse Practitioner | Admitting: Nurse Practitioner

## 2019-09-12 DIAGNOSIS — J42 Unspecified chronic bronchitis: Secondary | ICD-10-CM

## 2019-09-12 MED ORDER — PREDNISONE 20 MG PO TABS: 60.0000 mg | ORAL_TABLET | Freq: Every day | ORAL | 0 refills | Status: DC

## 2019-09-12 MED ORDER — PROMETHAZINE-DM 6.25-15 MG/5ML PO SYRP: 5.0000 mL | ORAL_SOLUTION | Freq: Four times a day (QID) | ORAL | 0 refills | Status: DC | PRN

## 2019-09-12 MED ORDER — LEVOFLOXACIN 500 MG PO TABS: 500.0000 mg | ORAL_TABLET | Freq: Every day | ORAL | 0 refills | Status: DC

## 2019-09-12 MED ORDER — METHYLPREDNISOLONE SODIUM SUCC 125 MG IJ SOLR
80.0000 mg | Freq: Once | INTRAMUSCULAR | Status: AC
  Administered 2019-09-12: 80 mg via INTRAMUSCULAR

## 2019-09-12 MED ORDER — DM-GUAIFENESIN ER 30-600 MG PO TB12: 1.0000 | ORAL_TABLET | Freq: Two times a day (BID) | ORAL | 0 refills | Status: AC

## 2019-09-12 NOTE — Telephone Encounter (Signed)
Noted  

## 2019-09-12 NOTE — Telephone Encounter (Signed)
Reviewed, but per our discussion, pt is going to go to urgent care.  I do not mind following up on whatever she needs.

## 2019-09-12 NOTE — Telephone Encounter (Signed)
Per our discussion, she will get initial work note from urgent care.  If needs something more, let me know.

## 2019-09-12 NOTE — Telephone Encounter (Signed)
Pt has been written out of work until 09/27/2019. Will follow up with her next week.

## 2019-09-12 NOTE — Telephone Encounter (Signed)
See mychart.  

## 2019-09-12 NOTE — Telephone Encounter (Signed)
With her having continuous need for prednisone and now with increased congestion, sob, chest tightness, she needs to be seen.  May need cxr, etc. Recommend urgent care today.  Also needs referral to pulmonary

## 2019-09-12 NOTE — Discharge Instructions (Addendum)
Take medications as prescribed  A chest x-ray has been ordered for you but they close at 5 pm. Report to the imaging center on Monday to get this done  Continue Zyrtec in the mornings and Singulair in the evenings  Continue Nebulizer treatments every 6 hours  Continue Symbicort twice daily  Albuterol inhaler as needed   You may take tylenol or ibuprofen as needed for fevers/headache/body aches. Drink plenty of fluids. Stay in home isolation until you receive results of your COVID test. You will only be notified for positive results. You may go online to MyChart in the next few days and review your results. Please follow CDC guidelines that are attached. You may discontinue home isolation when there has been at least 10 days since symptoms onset AND 3 days fever free without antipyretics (Tylenol or Ibuprofen) AND an overall improvement in your symptoms. Go to the ED immediately if you get worse or have any other symptoms.   Hope you feel better soon!  Merry Christmas Aldona Bar FNP-C

## 2019-09-12 NOTE — ED Triage Notes (Addendum)
Patient reports cough and drainage on Wednesday, states induced asthma attack. Patient states chest is tight, nasal congestion, headache sinus pressure. Patient reports back and chest discomfort with coughing.

## 2019-09-12 NOTE — Telephone Encounter (Signed)
See attached. Per our discussion, pt is going to urgent care.  I do not mind following up on whatever she needs.

## 2019-09-12 NOTE — Telephone Encounter (Signed)
See other message

## 2019-09-12 NOTE — Telephone Encounter (Signed)
Patient is going to the urgent care across the street from our office. They will do COVID test there and send over to the hospital if they feel that cxr is needed. Pt stated she will need a work note because even if her COVID test comes back negative she is required to complete quarantine period before she can return to work. Are you ok to write her a note stating that she needs to be out due to covid test and return date will be determined upon results? She was supposed to work Midwife.

## 2019-09-12 NOTE — Telephone Encounter (Signed)
Called patient to get some more info and advise her of previous message. She is agreeable to do a visit. She is congested in her chest, coughing, sinus pressure, some SOB but more just chest tightness. Temp has been 98.9-99.0. Using sudafed, inhalers and neb treatments. Has had multiple rounds of prednisone. She says she feels like her symptoms have worsened over the last week. She has not been COVID tested but says she has not been around anyone. Did you want to work her in this PM? Was not sure she needed to wait through the weekend.

## 2019-09-12 NOTE — ED Provider Notes (Signed)
Roderic Palau    CSN: [id number] Arrival date & time: 09/12/19  1608      History   Chief Complaint Chief Complaint  Patient presents with  . Cough    HPI UMA JUNIPER is a 43 y.o. female.   Subjective:   RUSTI HARDEN is a 43 y.o. female here for evaluation of a cough.  The cough is with blood tinged/dark yellow colored sputum, wheezing and shortness of breath. The cough is harsh and barking-like. The cough has worsened over the past couple of days. Initial onset of symptoms was about 4 months ago and has been waxing and waning since that time.  The patient also endorses sinus drainage, sweats, headache, fatigue and back pain. She denies any fevers, chills, body aches, sore throat, nausea, vomiting, diarrhea, dizziness or change in taste/smell. The patient does have a history of asthma. She denies any recent travel or known exposure to COVID-19. The patient does have a history of smoking but quit over 10 years ago. Patient  has not had a chest x-ray recently.   Notably, the patient has had several evaluations and phone call communications with her PCP for the same. She has been on at least two rounds of antibiotics as well as several courses of steroids for this problem since August 2020 and reports minimal improvement in her symptoms. Patient reports compliance with her prescribed regimen.   The following portions of the patient's history were reviewed and updated as appropriate: allergies, current medications, past family history, past medical history, past social history, past surgical history and problem list.        Past Medical History:  Diagnosis Date  . Asthma   . Depression    bipolar  . Fainting spell    H/O  . H/O cardiac arrhythmia   . Migraines   . Seizures [facility])     Patient Active Problem List   Diagnosis Date Noted  . Herpes zoster without complication 0000000  . GERD (gastroesophageal reflux disease) 01/05/2019  . Back pain 12/21/2018    . Leukocytosis 11/21/2018  . Abdominal pain 11/19/2018  . Conjunctivitis 05/23/2018  . Sleeping difficulty 10/31/2017  . Rash 06/11/2017  . Health care maintenance 10/12/2016  . History of miscarriage 10/12/2016  . Hypercholesterolemia 07/09/2016  . Migraine 07/20/2013  . Seizure disorder ([place]) 07/20/2013  . Asthma 07/20/2013  . Alternating constipation and diarrhea 07/20/2013    Past Surgical History:  Procedure Laterality Date  . TUBAL LIGATION Bilateral 09/2002    OB History   No obstetric history on file.      Home Medications    Prior to Admission medications   Medication Sig Start Date End Date Taking? Authorizing Provider  albuterol (PROVENTIL HFA;VENTOLIN HFA) 108 (90 Base) MCG/ACT inhaler Inhale 1-2 puffs into the lungs every 4 (four) hours as needed for wheezing or shortness of breath. 12/26/18   Einar Pheasant, MD  budesonide-formoterol [facility]) 160-4.5 MCG/ACT inhaler Inhale 2 puffs into the lungs 2 (two) times daily. Rinse mouth 12/26/18   Einar Pheasant, MD  cetirizine (ZYRTEC) 10 MG tablet Take 1 tablet (10 mg total) by mouth daily. 07/31/19   Guse, Jacquelynn Cree, FNP  dextromethorphan-guaiFENesin (MUCINEX DM) 30-600 MG 12hr tablet Take 1 tablet by mouth 2 (two) times daily for 14 days. 09/12/19 09/26/19  Enrique Sack, FNP  diazepam (VALIUM) 10 MG tablet TAKE 1 TABLET BY MOUTH 4 TIMES DAILY AS NEEDED 02/13/19   [provider]  dicyclomine (BENTYL) 10 MG/5ML syrup  Take 5 mLs by mouth at bedtime as needed. 12/25/18   [provider]  famotidine (PEPCID) 20 MG tablet Take 1 tablet (20 mg total) by mouth daily. 01/02/19   Einar Pheasant, MD  fluticasone (FLONASE) 50 MCG/ACT nasal spray Place 2 sprays into both nostrils daily. 01/02/19   Einar Pheasant, MD  ipratropium-albuterol (DUONEB) 0.5-2.5 (3) MG/3ML SOLN USE 1 AMPULE IN NEBULIZER EVERY 4 HOURS AS NEEDED 09/08/19   Einar Pheasant, MD  levofloxacin (LEVAQUIN) 500 MG tablet Take 1 tablet (500 mg  total) by mouth daily. 09/12/19   Enrique Sack, FNP  Magnesium 200 MG TABS Take by mouth.    [provider]  montelukast (SINGULAIR) 10 MG tablet Take 1 tablet (10 mg total) by mouth at bedtime. 07/31/19   Jodelle Green, FNP  nortriptyline (PAMELOR) 10 MG capsule Take 30 mg nightly for two weeks then increase to 40 mg at night and continue that dose 02/04/19   [provider]  predniSONE (DELTASONE) 20 MG tablet Take 3 tablets (60 mg total) by mouth daily for 5 days. 09/12/19 09/17/19  Enrique Sack, FNP  promethazine-dextromethorphan (PROMETHAZINE-DM) 6.25-15 MG/5ML syrup Take 5 mLs by mouth 4 (four) times daily as needed for cough. 09/12/19   Enrique Sack, FNP  QUEtiapine (SEROQUEL XR) 300 MG 24 hr tablet Take 600 mg by mouth at bedtime. 02/13/19   [provider]  triamcinolone cream (KENALOG) 0.1 % Apply 1 application topically 2 (two) times daily. 03/12/19   Einar Pheasant, MD    Family History Family History  Problem Relation Age of Onset  . Hypertension Mother   . Diabetes Mother   . Arthritis Mother   . Heart disease Mother   . Breast cancer Neg Hx     Social History Social History   Tobacco Use  . Smoking status: Former Smoker    Packs/day: 0.50    Types: Cigarettes  . Smokeless tobacco: Never Used  . Tobacco comment: Quit mid October 2014  Substance Use Topics  . Alcohol use: No    Alcohol/week: 0.0 standard drinks  . Drug use: No     Allergies   Amoxicillin, Augmentin [amoxicillin-pot clavulanate], Erythromycin, Lithium, and Tramadol   Review of Systems Review of Systems  Constitutional: Positive for fatigue. Negative for fever.  HENT: Positive for congestion and postnasal drip.   Respiratory: Positive for cough, shortness of breath and wheezing.   Cardiovascular: Negative for chest pain.  Gastrointestinal: Negative for nausea and vomiting.  Musculoskeletal: Positive for back pain.  Neurological: Positive for  headaches. Negative for dizziness.  All other systems reviewed and are negative.    Physical Exam Triage Vital Signs ED Triage Vitals  Enc Vitals Group     BP 09/12/19 1612 132/77     Pulse Rate 09/12/19 1612 (!) 118     Resp 09/12/19 1612 (!) 30     Temp 09/12/19 1612 98.6 F (37 C)     Temp Source 09/12/19 1612 Oral     SpO2 09/12/19 1612 98 %     Weight --      Height --      Head Circumference --      Peak Flow --      Pain Score 09/12/19 1609 3     Pain Loc --      Pain Edu? --      Excl. in [place]? --    No data found.  Updated Vital Signs BP 132/77 (BP Location: Left Arm)  Pulse (!) 118 Comment: took dose of albuterol about 25min ago  Temp 98.6 F (37 C) (Oral)   Resp (!) 30   SpO2 98%   Visual Acuity Right Eye Distance:   Left Eye Distance:   Bilateral Distance:    Right Eye Near:   Left Eye Near:    Bilateral Near:     Physical Exam Vitals reviewed.  Constitutional:      General: She is not in acute distress.    Appearance: Normal appearance. She is ill-appearing. She is not toxic-appearing or diaphoretic.  HENT:     Head: Normocephalic.  Cardiovascular:     Rate and Rhythm: Regular rhythm. Tachycardia present.  Pulmonary:     Effort: Pulmonary effort is normal. Tachypnea present. No accessory muscle usage, prolonged expiration, respiratory distress or retractions.     Breath sounds: No decreased air movement. Wheezing present.  Musculoskeletal:        General: Normal range of motion.     Cervical back: Normal range of motion and neck supple.  Skin:    General: Skin is warm and dry.  Neurological:     General: No focal deficit present.     Mental Status: [name] and oriented to person, place, and time.  Psychiatric:        Mood and Affect: Mood normal.        Behavior: Behavior normal.      UC Treatments / Results  Labs (all labs ordered are listed, but only abnormal results are displayed) Labs Reviewed  NOVEL CORONAVIRUS, NAA     EKG   Radiology No results found.  Procedures Procedures (including critical care time)  Medications Ordered in UC Medications  methylPREDNISolone sodium succinate (SOLU-MEDROL) 125 mg/2 mL injection 80 mg (has no administration in time range)    Initial Impression / Assessment and Plan / UC Course  I have reviewed the triage vital signs and the nursing notes.  Pertinent labs & imaging results that were available during my care of the patient were reviewed by me and considered in my medical decision making (see chart for details).    43 yo female with a history of asthma presents with worsening cough that has been waxing and waning over the past 4 months. She also has sinus drainage, sweats, headache, fatigue and back pain. She has had antibiotics and steroids for her symptoms with minimal improvement in her symptoms. Patient is AAOx3. Afebrile. Ill-appearing but nontoxic. Mild tachycardia and tachypnea present without any acute distress noted. Solumedrol 80 mg IM given in clinic. COVID PCR testing pending. Antibiotics, steroids and antitussives per medication orders. Continue regimen prescribed by PCP. Avoid exposure to tobacco smoke and fumes. Outpatient CXR ordered. Considering that it is almost 5 pm, patient advised to report to the outpatient imaging center on Monday morning. She should report to the ED immediately over the weekend should she have any worsening or concerning symptoms.   Today's evaluation has revealed no signs of a dangerous process. Discussed diagnosis with patient and/or guardian. Patient and/or guardian aware of their diagnosis, possible red flag symptoms to watch out for and need for close follow up. Patient and/or guardian understands verbal and written discharge instructions. Patient and/or guardian comfortable with plan and disposition.  Patient and/or guardian has a clear mental status at this time, good insight into illness (after discussion and teaching)  and has clear judgment to make decisions regarding their care  This care was provided during an unprecedented [facility] Emergency  due to the Novel Coronavirus (COVID-19) pandemic. COVID-19 infections and transmission risks place heavy strains on healthcare resources.  As this pandemic evolves, our facility, providers, and staff strive to respond fluidly, to remain operational, and to provide care relative to available resources and information. Outcomes are unpredictable and treatments are without well-defined guidelines. Further, the impact of COVID-19 on all aspects of urgent care, including the impact to patients seeking care for reasons other than COVID-19, is unavoidable during this national emergency. At this time of the global pandemic, management of patients has significantly changed, even for non-COVID positive patients given high local and regional COVID volumes at this time requiring high healthcare system and resource utilization. The standard of care for management of both COVID suspected and non-COVID suspected patients continues to change rapidly at the local, regional, national, and global levels. This patient was worked up and treated to the best available but ever changing evidence and resources available at this current time.   Documentation was completed with the aid of voice recognition software. Transcription may contain typographical errors. Final Clinical Impressions(s) / UC Diagnoses   Final diagnoses:  Chronic bronchitis, unspecified chronic bronchitis type [facility])     Discharge Instructions     . Take medications as prescribed  . A chest x-ray has been ordered for you but they close at 5 pm. Report to the imaging center on Monday to get this done  . Continue Zyrtec in the mornings and Singulair in the evenings  . Continue Nebulizer treatments every 6 hours  . Continue Symbicort twice daily  . Albuterol inhaler as needed  . Follow-up with PCP in 1 week or sooner if needed   . You will probably benefit from a pulmonary consultation as well  . Go to ED immediately if symptoms get worse   Hope you feel better soon!  Merry Christmas Aldona Bar FNP-C    ED Prescriptions    Medication Sig Dispense Auth. Provider   levofloxacin (LEVAQUIN) 500 MG tablet Take 1 tablet (500 mg total) by mouth daily. 10 tablet Enrique Sack, FNP   predniSONE (DELTASONE) 20 MG tablet Take 3 tablets (60 mg total) by mouth daily for 5 days. 15 tablet Enrique Sack, FNP   promethazine-dextromethorphan (PROMETHAZINE-DM) 6.25-15 MG/5ML syrup Take 5 mLs by mouth 4 (four) times daily as needed for cough. 118 mL Keyvon Herter, Aldona Bar, FNP   dextromethorphan-guaiFENesin [facility] DM) 30-600 MG 12hr tablet Take 1 tablet by mouth 2 (two) times daily for 14 days. 28 tablet Enrique Sack, FNP     PDMP not reviewed this encounter.   Enrique Sack, [place] 09/12/19 1708

## 2019-09-12 NOTE — Telephone Encounter (Signed)
Patient returned your call and only wanted to speak to [place].

## 2019-09-12 NOTE — Telephone Encounter (Signed)
Patient is refusing to go to urgent care or ED. Explained the need for her to have hands on exam, may need covid test, cxr, etc. Pt stated she absolutely was not going to acute care/ED. Her daughter is an ED nurse and there are too many sick people. She would like to have cxr. Explained that she cannot come into our office due to symptoms. Pt also stated she does not want referral to pulmonary, she would like ENT to evaluate and handle her sleep study.

## 2019-09-13 NOTE — Telephone Encounter (Signed)
Reviewed.  Please f/u and confirm ok.

## 2019-09-14 LAB — NOVEL CORONAVIRUS, NAA: SARS-CoV-2, NAA: NOT DETECTED

## 2019-09-15 ENCOUNTER — Ambulatory Visit
Admission: RE | Admit: 2019-09-15 | Discharge: 2019-09-15 | Disposition: A | Discharge status: Home / Self Care | Payer: BC Managed Care – PPO | Source: Ambulatory Visit | Attending: Nurse Practitioner | Admitting: Nurse Practitioner

## 2019-09-15 ENCOUNTER — Ambulatory Visit
Admission: RE | Admit: 2019-09-15 | Discharge: 2019-09-15 | Disposition: A | Discharge status: Home / Self Care | Payer: BC Managed Care – PPO | Attending: Nurse Practitioner | Admitting: Nurse Practitioner

## 2019-09-15 ENCOUNTER — Other Ambulatory Visit: Payer: Self-pay

## 2019-09-15 DIAGNOSIS — Z87891 Personal history of nicotine dependence: Secondary | ICD-10-CM | POA: Diagnosis not present

## 2019-09-15 DIAGNOSIS — J440 Chronic obstructive pulmonary disease with acute lower respiratory infection: Secondary | ICD-10-CM | POA: Insufficient documentation

## 2019-09-15 DIAGNOSIS — J209 Acute bronchitis, unspecified: Secondary | ICD-10-CM | POA: Diagnosis present

## 2019-09-15 IMAGING — CR DG CHEST 2V
1 series · 2 of 2 positions shown · non-contrast
Comparison: [DATE]

CLINICAL DATA: Productive cough and wheezing. Acute on chronic
bronchitis. Asthma. Former smoker.

EXAM:
CHEST - 2 VIEW

[Series 1: dg chest 2 view · 0.14mm/px · 2 of 2 slices shown]
[im 1/2]
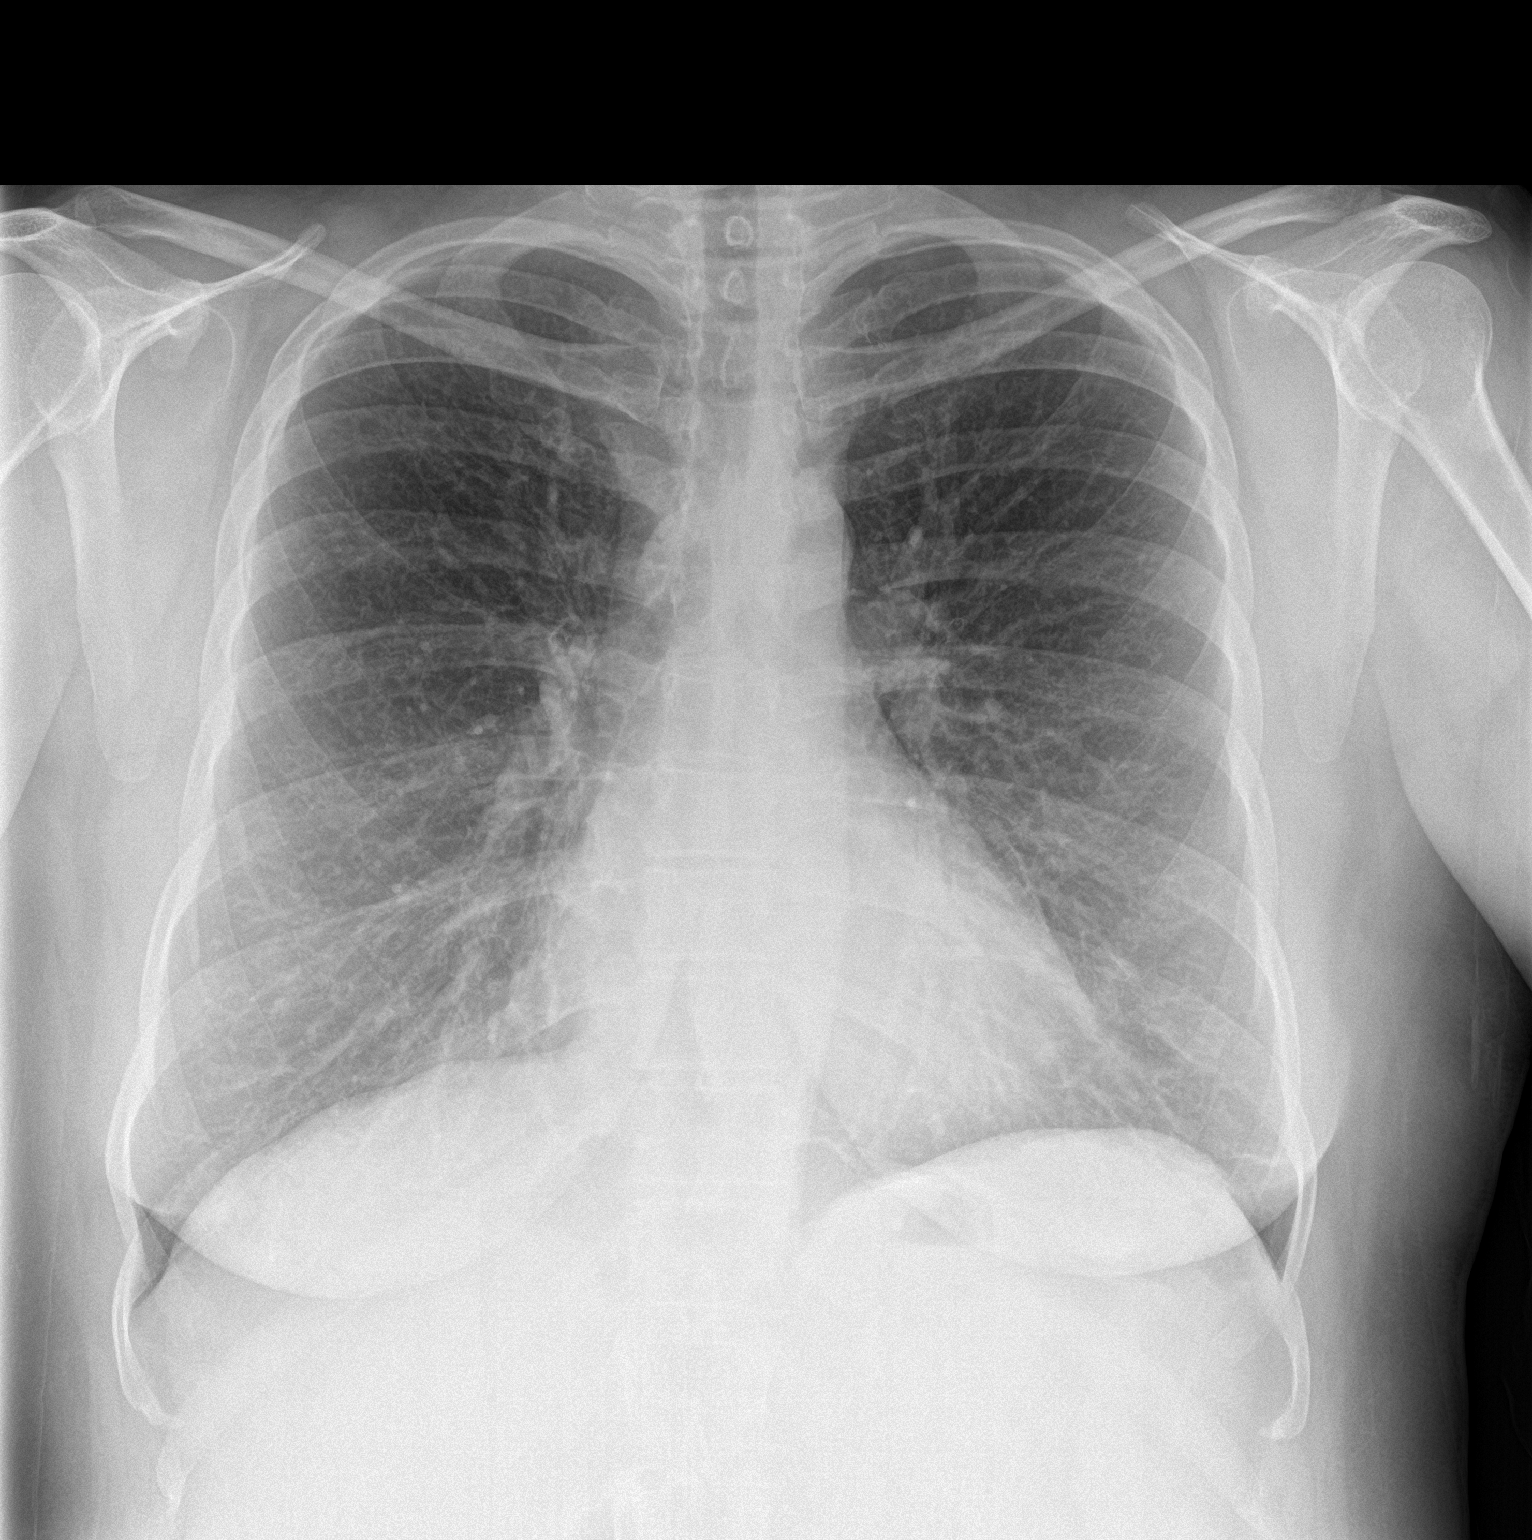
[im 2/2]
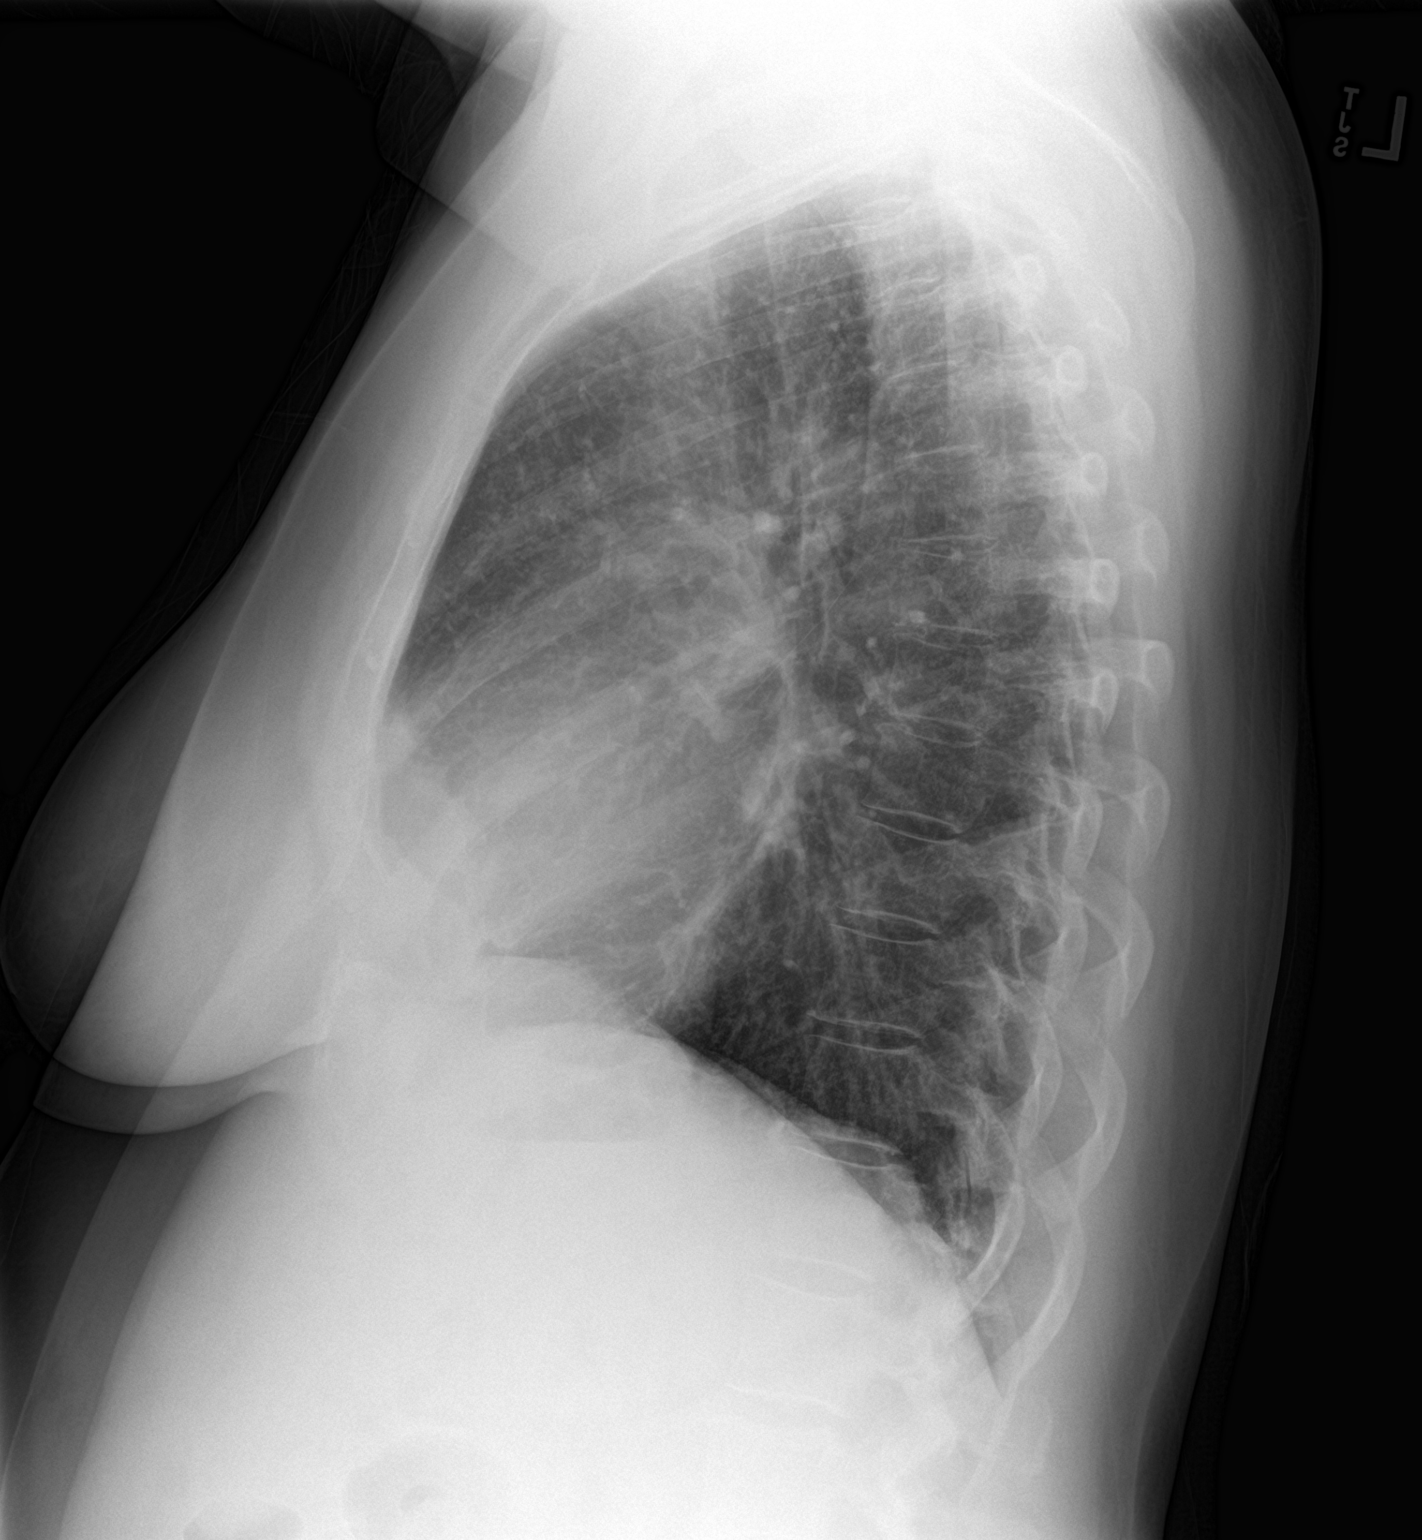

[2 of 2 positions shown; findings below may reference images not displayed]

FINDINGS: The heart size and mediastinal contours are within normal limits.
Both lungs are clear. The visualized skeletal structures are
unremarkable.
IMPRESSION: No active cardiopulmonary disease.

## 2019-09-16 ENCOUNTER — Encounter: Payer: Self-pay | Admitting: Internal Medicine

## 2019-09-16 NOTE — Telephone Encounter (Signed)
See other message

## 2019-09-16 NOTE — Telephone Encounter (Signed)
Can schedule f/u appt to discuss.

## 2019-09-16 NOTE — Telephone Encounter (Signed)
Pt scheduled for [place] tomorrow at 1:00

## 2019-09-17 ENCOUNTER — Ambulatory Visit (INDEPENDENT_AMBULATORY_CARE_PROVIDER_SITE_OTHER): Payer: BC Managed Care – PPO | Admitting: Internal Medicine

## 2019-09-17 ENCOUNTER — Other Ambulatory Visit: Payer: Self-pay

## 2019-09-17 DIAGNOSIS — J40 Bronchitis, not specified as acute or chronic: Secondary | ICD-10-CM

## 2019-09-17 DIAGNOSIS — J4520 Mild intermittent asthma, uncomplicated: Secondary | ICD-10-CM

## 2019-09-17 MED ORDER — PREDNISONE 10 MG PO TABS: ORAL_TABLET | ORAL | 0 refills | Status: DC

## 2019-09-17 NOTE — Progress Notes (Signed)
Patient ID: Heather [name], female   DOB: [birth year], 43 y.o.   MRN: [id number]   Virtual Visit via video Note  This visit type was conducted due to national recommendations for restrictions regarding the COVID-19 pandemic (e.g. social distancing).  This format is felt to be most appropriate for this patient at this time.  All issues noted in this document were discussed and addressed.  No physical exam was performed (except for noted visual exam findings with Video Visits).   I connected with Heather [name] by a video enabled telemedicine application and verified that I am speaking with the correct person using two identifiers. Location patient: home Location provider: work  Persons participating in the virtual visit: patient, provider  I discussed the limitations, risks, security and privacy concerns of performing an evaluation and management service by video and the availability of in person appointments. The patient expressed understanding and agreed to proceed.   Reason for visit: work in appt  HPI: Was evaluated at urgent care 09/12/19.  Diagnosed with bronchitis.  Treated with levaquin and short course of prednisone.  Symptoms started with watery eyes and nasal congestion.  Increased drainage into chest.  Progressed - tight chest.  Occasional productive cough.  No nausea or vomiting.  Has noticed recently some increased acid reflux.  Taking zyrtec and singulair.  Using flonase.  Still has several days of abx.  She does not feel prednisone course was long enough.  She is some better, but still with chest symptoms.     ROS: See pertinent positives and negatives per HPI.  Past Medical History:  Diagnosis Date  . Asthma   . Depression    bipolar  . Fainting spell    H/O  . H/O cardiac arrhythmia   . Migraines   . Seizures ([place])     Past Surgical History:  Procedure Laterality Date  . TUBAL LIGATION Bilateral 09/2002    Family History  Problem Relation Age of Onset  .  Hypertension Mother   . Diabetes Mother   . Arthritis Mother   . Heart disease Mother   . Breast cancer Neg Hx     SOCIAL HX: reviewed.    Current Outpatient Medications:  .  albuterol (PROVENTIL HFA;VENTOLIN HFA) 108 (90 Base) MCG/ACT inhaler, Inhale 1-2 puffs into the lungs every 4 (four) hours as needed for wheezing or shortness of breath., Disp: 1 Inhaler, Rfl: 11 .  budesonide-formoterol (SYMBICORT) 160-4.5 MCG/ACT inhaler, Inhale 2 puffs into the lungs 2 (two) times daily. Rinse mouth, Disp: 1 Inhaler, Rfl: 12 .  cetirizine (ZYRTEC) 10 MG tablet, Take 1 tablet (10 mg total) by mouth daily., Disp: 30 tablet, Rfl: 3 .  dextromethorphan-guaiFENesin (MUCINEX DM) 30-600 MG 12hr tablet, Take 1 tablet by mouth 2 (two) times daily for 14 days., Disp: 28 tablet, Rfl: 0 .  diazepam (VALIUM) 10 MG tablet, TAKE 1 TABLET BY MOUTH 4 TIMES DAILY AS NEEDED, Disp: , Rfl:  .  dicyclomine (BENTYL) 10 MG/5ML syrup, Take 5 mLs by mouth at bedtime as needed., Disp: , Rfl:  .  famotidine (PEPCID) 20 MG tablet, Take 1 tablet (20 mg total) by mouth daily., Disp: 30 tablet, Rfl: 1 .  fluticasone (FLONASE) 50 MCG/ACT nasal spray, Place 2 sprays into both nostrils daily., Disp: 16 g, Rfl: 1 .  ipratropium-albuterol (DUONEB) 0.5-2.5 (3) MG/3ML SOLN, USE 1 AMPULE IN NEBULIZER EVERY 4 HOURS AS NEEDED, Disp: 360 mL, Rfl: 0 .  levofloxacin (LEVAQUIN) 500 MG tablet, Take 1 tablet (  500 mg total) by mouth daily., Disp: 10 tablet, Rfl: 0 .  Magnesium 200 MG TABS, Take by mouth., Disp: , Rfl:  .  montelukast (SINGULAIR) 10 MG tablet, Take 1 tablet (10 mg total) by mouth at bedtime., Disp: 30 tablet, Rfl: 3 .  nortriptyline (PAMELOR) 10 MG capsule, Take 30 mg nightly for two weeks then increase to 40 mg at night and continue that dose, Disp: , Rfl:  .  predniSONE (DELTASONE) 10 MG tablet, Take 6 tablets x 1 day and then decrease by 1/2 tablet per day until down to zero mg., Disp: 39 tablet, Rfl: 0 .   promethazine-dextromethorphan (PROMETHAZINE-DM) 6.25-15 MG/5ML syrup, Take 5 mLs by mouth 4 (four) times daily as needed for cough., Disp: 118 mL, Rfl: 0 .  rizatriptan (MAXALT-MLT) 10 MG disintegrating tablet, TAKE ONE TABLET BY MOUTH ONCE AS NEEDED FOR MIGRAINE FOR UP TO ONE DOSE. MAY TAKE A SECOND DOSE AFTER TWO HOURS IF NEEDED., Disp: , Rfl:  .  traZODone (DESYREL) 100 MG tablet, Take 100 mg by mouth at bedtime., Disp: , Rfl:  .  triamcinolone cream (KENALOG) 0.1 %, Apply 1 application topically 2 (two) times daily., Disp: 30 g, Rfl: 0  EXAM:  GENERAL: alert, oriented, in no acute distress  HEENT: atraumatic, conjunttiva clear, no obvious abnormalities on inspection of external nose and ears  NECK: normal movements of the head and neck  LUNGS: on inspection no signs of respiratory distress, breathing rate appears normal, no obvious gross SOB, gasping or wheezing  CV: no obvious cyanosis  PSYCH/NEURO: pleasant and cooperative, no obvious depression or anxiety, speech and thought processing grossly intact  ASSESSMENT AND PLAN:  Discussed the following assessment and plan:  Asthma Treat active flare and infection as outlined.  Continue inhalers.    Bronchitis Continue levaquin - complete course.  Continue inhalers and mucinex.  Prednisone as directed.  Will extend.  Discussed with her regarding persistent intermittent flares. She feels they start with sinus issues and then move into her chest.  Discussed referral.  She request ENT referral for further evaluation and w/up - to prevent flares.  Call with update.     I discussed the assessment and treatment plan with the patient. The patient was provided an opportunity to ask questions and all were answered. The patient agreed with the plan and demonstrated an understanding of the instructions.   The patient was advised to call back or seek an in-person evaluation if the symptoms worsen or if the condition fails to improve as  anticipated.   Einar Pheasant, MD

## 2019-09-17 NOTE — Telephone Encounter (Signed)
Appt moved up to 11:30 per Dr Nicki Reaper

## 2019-09-22 ENCOUNTER — Encounter: Payer: Self-pay | Admitting: Internal Medicine

## 2019-09-22 DIAGNOSIS — J40 Bronchitis, not specified as acute or chronic: Secondary | ICD-10-CM | POA: Insufficient documentation

## 2019-09-22 NOTE — Assessment & Plan Note (Signed)
Treat active flare and infection as outlined.  Continue inhalers.

## 2019-09-22 NOTE — Assessment & Plan Note (Signed)
Continue levaquin - complete course.  Continue inhalers and mucinex.  Prednisone as directed.  Will extend.  Discussed with her regarding persistent intermittent flares. She feels they start with sinus issues and then move into her chest.  Discussed referral.  She request ENT referral for further evaluation and w/up - to prevent flares.  Call with update.

## 2019-10-02 ENCOUNTER — Encounter: Payer: Self-pay | Admitting: Internal Medicine

## 2019-10-29 ENCOUNTER — Other Ambulatory Visit: Payer: Self-pay | Admitting: Unknown Physician Specialty

## 2019-10-29 DIAGNOSIS — J329 Chronic sinusitis, unspecified: Secondary | ICD-10-CM

## 2019-10-30 ENCOUNTER — Other Ambulatory Visit: Payer: Self-pay | Admitting: Internal Medicine

## 2019-10-31 ENCOUNTER — Encounter: Payer: Self-pay | Admitting: Internal Medicine

## 2019-11-03 ENCOUNTER — Other Ambulatory Visit: Payer: Self-pay

## 2019-11-03 DIAGNOSIS — J4541 Moderate persistent asthma with (acute) exacerbation: Secondary | ICD-10-CM

## 2019-11-03 MED ORDER — MONTELUKAST SODIUM 10 MG PO TABS: 10.0000 mg | ORAL_TABLET | Freq: Every day | ORAL | 1 refills | Status: DC

## 2019-11-04 ENCOUNTER — Other Ambulatory Visit: Payer: Self-pay

## 2019-11-04 ENCOUNTER — Ambulatory Visit
Admission: RE | Admit: 2019-11-04 | Discharge: 2019-11-04 | Disposition: A | Discharge status: Home / Self Care | Payer: BC Managed Care – PPO | Source: Ambulatory Visit | Attending: Unknown Physician Specialty | Admitting: Unknown Physician Specialty

## 2019-11-04 DIAGNOSIS — J329 Chronic sinusitis, unspecified: Secondary | ICD-10-CM

## 2019-11-06 ENCOUNTER — Other Ambulatory Visit: Payer: Self-pay

## 2019-11-06 MED ORDER — TRAZODONE HCL 100 MG PO TABS: 100.0000 mg | ORAL_TABLET | Freq: Every day | ORAL | 1 refills | Status: DC

## 2019-12-09 ENCOUNTER — Other Ambulatory Visit: Payer: Self-pay | Admitting: Internal Medicine

## 2019-12-11 ENCOUNTER — Other Ambulatory Visit: Payer: Self-pay | Admitting: Internal Medicine

## 2020-01-15 ENCOUNTER — Other Ambulatory Visit: Payer: Self-pay | Admitting: Internal Medicine

## 2020-04-29 ENCOUNTER — Other Ambulatory Visit: Payer: Self-pay | Admitting: Internal Medicine

## 2020-04-29 ENCOUNTER — Encounter: Payer: Self-pay | Admitting: Internal Medicine

## 2020-04-30 ENCOUNTER — Encounter: Payer: Self-pay | Admitting: Internal Medicine

## 2020-04-30 ENCOUNTER — Telehealth (INDEPENDENT_AMBULATORY_CARE_PROVIDER_SITE_OTHER): Payer: BC Managed Care – PPO | Admitting: Internal Medicine

## 2020-04-30 DIAGNOSIS — J4541 Moderate persistent asthma with (acute) exacerbation: Secondary | ICD-10-CM | POA: Diagnosis not present

## 2020-04-30 MED ORDER — BUDESONIDE-FORMOTEROL FUMARATE 160-4.5 MCG/ACT IN AERO: 2.0000 | INHALATION_SPRAY | Freq: Two times a day (BID) | RESPIRATORY_TRACT | 11 refills | Status: DC

## 2020-04-30 MED ORDER — ALBUTEROL SULFATE HFA 108 (90 BASE) MCG/ACT IN AERS: 1.0000 | INHALATION_SPRAY | RESPIRATORY_TRACT | 11 refills | Status: DC | PRN

## 2020-04-30 MED ORDER — CETIRIZINE HCL 10 MG PO TABS: 10.0000 mg | ORAL_TABLET | Freq: Every day | ORAL | 3 refills | Status: DC

## 2020-04-30 MED ORDER — PREDNISONE 10 MG PO TABS: ORAL_TABLET | ORAL | 0 refills | Status: DC

## 2020-04-30 MED ORDER — MONTELUKAST SODIUM 10 MG PO TABS: 10.0000 mg | ORAL_TABLET | Freq: Every day | ORAL | 3 refills | Status: DC

## 2020-04-30 MED ORDER — IPRATROPIUM-ALBUTEROL 0.5-2.5 (3) MG/3ML IN SOLN: 3.0000 mL | RESPIRATORY_TRACT | 11 refills | Status: DC | PRN

## 2020-04-30 NOTE — Progress Notes (Signed)
Virtual Visit via Video Note  I connected with Heather [name]  on 04/30/20 at  3:30 PM EDT by a video enabled telemedicine application and verified that I am speaking with the correct person using two identifiers.  Location patient: home Location provider:work or home office Persons participating in the virtual visit: patient, provider  I discussed the limitations of evaluation and management by telemedicine and the availability of in person appointments. The patient expressed understanding and agreed to proceed.   HPI: 1. Asthma exacerbation with cough worse at night she had been moving the lawn w/in the last week and sx's started cough worse at night and in the am she is seeing allergist and will have allergy testing and f/u with them and ask about Dupixent   2 OSA on cpap helping with her breathing at night  ROS: See pertinent positives and negatives per HPI.  Past Medical History:  Diagnosis Date  . Asthma   . Depression    bipolar  . Fainting spell    H/O  . H/O cardiac arrhythmia   . Migraines   . Seizures ([place])     Past Surgical History:  Procedure Laterality Date  . TUBAL LIGATION Bilateral 09/2002    Family History  Problem Relation Age of Onset  . Hypertension Mother   . Diabetes Mother   . Arthritis Mother   . Heart disease Mother   . Breast cancer Neg Hx     SOCIAL HX: lives at home   Current Outpatient Medications:  .  albuterol (VENTOLIN HFA) 108 (90 Base) MCG/ACT inhaler, Inhale 1-2 puffs into the lungs every 4 (four) hours as needed for wheezing or shortness of breath., Disp: 18 g, Rfl: 11 .  budesonide-formoterol (SYMBICORT) 160-4.5 MCG/ACT inhaler, Inhale 2 puffs into the lungs 2 (two) times daily. Rinse mouth, Disp: 10.2 g, Rfl: 11 .  cetirizine (ZYRTEC) 10 MG tablet, Take 1 tablet (10 mg total) by mouth at bedtime., Disp: 90 tablet, Rfl: 3 .  diazepam (VALIUM) 10 MG tablet, TAKE 1 TABLET BY MOUTH 4 TIMES DAILY AS NEEDED, Disp: , Rfl:  .   famotidine (PEPCID) 20 MG tablet, Take 1 tablet (20 mg total) by mouth daily., Disp: 30 tablet, Rfl: 1 .  fluticasone (FLONASE) 50 MCG/ACT nasal spray, Place 2 sprays into both nostrils daily., Disp: 16 g, Rfl: 1 .  ipratropium-albuterol (DUONEB) 0.5-2.5 (3) MG/3ML SOLN, Inhale 3 mLs into the lungs every 4 (four) hours as needed., Disp: 360 mL, Rfl: 11 .  Magnesium 200 MG TABS, Take by mouth., Disp: , Rfl:  .  montelukast (SINGULAIR) 10 MG tablet, Take 1 tablet (10 mg total) by mouth at bedtime., Disp: 90 tablet, Rfl: 3 .  nortriptyline (PAMELOR) 10 MG capsule, Take 30 mg nightly for two weeks then increase to 40 mg at night and continue that dose, Disp: , Rfl:  .  promethazine-dextromethorphan (PROMETHAZINE-DM) 6.25-15 MG/5ML syrup, Take 5 mLs by mouth 4 (four) times daily as needed for cough., Disp: 118 mL, Rfl: 0 .  rizatriptan (MAXALT-MLT) 10 MG disintegrating tablet, TAKE ONE TABLET BY MOUTH ONCE AS NEEDED FOR MIGRAINE FOR UP TO ONE DOSE. MAY TAKE A SECOND DOSE AFTER TWO HOURS IF NEEDED., Disp: , Rfl:  .  traZODone (DESYREL) 100 MG tablet, Take 1 tablet (100 mg total) by mouth at bedtime., Disp: 30 tablet, Rfl: 1 .  triamcinolone cream (KENALOG) 0.1 %, Apply 1 application topically 2 (two) times daily., Disp: 30 g, Rfl: 0 .  predniSONE (DELTASONE)  10 MG tablet, Take 6 tablets x 1 day and then decrease by 1/2 tablet per day until down to zero mg., Disp: 39 tablet, Rfl: 0  EXAM:  VITALS per patient if applicable:  GENERAL: alert, oriented, appears well and in no acute distress  HEENT: atraumatic, conjunttiva clear, no obvious abnormalities on inspection of external nose and ears  NECK: normal movements of the head and neck  LUNGS: on inspection no signs of respiratory distress, breathing rate appears normal, no obvious gross SOB, gasping or wheezing Coughing on exam   CV: no obvious cyanosis  MS: moves all visible extremities without noticeable abnormality  PSYCH/NEURO: pleasant and  cooperative, no obvious depression or anxiety, speech and thought processing grossly intact  ASSESSMENT AND PLAN:  Discussed the following assessment and plan:  Moderate persistent asthma with exacerbation - Plan: montelukast (SINGULAIR) 10 MG tablet, ipratropium-albuterol (DUONEB) 0.5-2.5 (3) MG/3ML SOLN, cetirizine (ZYRTEC) 10 MG tablet, albuterol (VENTOLIN HFA) 108 (90 Base) MCG/ACT inhaler, budesonide-formoterol (SYMBICORT) 160-4.5 MCG/ACT inhaler, predniSONE (DELTASONE) 10 MG tablet Considering Dupixent with allergy appt sch 05/2020  If worse go to ED  Denies covid 19 sx's/exposure  -we discussed possible serious and likely etiologies, options for evaluation and workup, limitations of telemedicine visit vs in person visit, treatment, treatment risks and precautions. Pt prefers to treat via telemedicine empirically rather then risking or undertaking an in person visit at this moment. Patient agrees to seek prompt in person care if worsening, new symptoms arise, or if is not improving with treatment.   I discussed the assessment and treatment plan with the patient. The patient was provided an opportunity to ask questions and all were answered. The patient agreed with the plan and demonstrated an understanding of the instructions.   The patient was advised to call back or seek an in-person evaluation if the symptoms worsen or if the condition fails to improve as anticipated.  Time spent 30 min Delorise Jackson, MD

## 2020-05-07 ENCOUNTER — Encounter: Payer: Self-pay | Admitting: Internal Medicine

## 2020-05-12 NOTE — Telephone Encounter (Signed)
I have her form.  Her mother dropped it off. Need to clarify - is this just stating she needs intermittent time off for MD appts, etc.

## 2020-05-13 NOTE — Telephone Encounter (Signed)
Pt called about FMLA paperwork. Please advise

## 2020-05-14 NOTE — Telephone Encounter (Signed)
Patient stated that it is for intermittent MD appts or when she has sickness/flare ups and has to miss more than 3 consecutive days of work.Advised I will let her know when complete

## 2020-05-18 NOTE — Telephone Encounter (Signed)
Patients mother has picked up forms

## 2020-05-18 NOTE — Telephone Encounter (Signed)
Please confirm pt gets forms.

## 2020-05-18 NOTE — Telephone Encounter (Signed)
Form is completed and placed in box.  I have sent pt a my chart message. Please contact her and see how she wants [place] to proceed - for example - fax, etc.

## 2020-06-07 ENCOUNTER — Encounter: Payer: Self-pay | Admitting: Internal Medicine

## 2020-06-07 ENCOUNTER — Ambulatory Visit: Payer: Self-pay

## 2020-06-07 ENCOUNTER — Ambulatory Visit
Admission: EM | Admit: 2020-06-07 | Discharge: 2020-06-07 | Disposition: A | Discharge status: Home / Self Care | Payer: BC Managed Care – PPO | Attending: Emergency Medicine | Admitting: Emergency Medicine

## 2020-06-07 DIAGNOSIS — R519 Headache, unspecified: Secondary | ICD-10-CM

## 2020-06-07 DIAGNOSIS — J069 Acute upper respiratory infection, unspecified: Secondary | ICD-10-CM

## 2020-06-07 DIAGNOSIS — Z20822 Contact with and (suspected) exposure to covid-19: Secondary | ICD-10-CM

## 2020-06-07 MED ORDER — ONDANSETRON 4 MG PO TBDP
4.0000 mg | ORAL_TABLET | Freq: Once | ORAL | Status: AC
  Administered 2020-06-07: 4 mg via ORAL

## 2020-06-07 MED ORDER — CETIRIZINE HCL 10 MG PO TABS: 10.0000 mg | ORAL_TABLET | Freq: Every day | ORAL | 0 refills | Status: DC

## 2020-06-07 MED ORDER — PREDNISONE 50 MG PO TABS: 50.0000 mg | ORAL_TABLET | Freq: Every day | ORAL | 0 refills | Status: DC

## 2020-06-07 MED ORDER — KETOROLAC TROMETHAMINE 30 MG/ML IJ SOLN
30.0000 mg | Freq: Once | INTRAMUSCULAR | Status: AC
  Administered 2020-06-07: 30 mg via INTRAMUSCULAR

## 2020-06-07 MED ORDER — FLUTICASONE PROPIONATE 50 MCG/ACT NA SUSP: 1.0000 | Freq: Every day | NASAL | 0 refills | Status: DC

## 2020-06-07 MED ORDER — BENZONATATE 100 MG PO CAPS: 100.0000 mg | ORAL_CAPSULE | Freq: Three times a day (TID) | ORAL | 0 refills | Status: DC

## 2020-06-07 MED ORDER — DEXAMETHASONE SODIUM PHOSPHATE 10 MG/ML IJ SOLN
10.0000 mg | Freq: Once | INTRAMUSCULAR | Status: AC
  Administered 2020-06-07: 10 mg via INTRAMUSCULAR

## 2020-06-07 NOTE — ED Triage Notes (Signed)
C/o headache, sinus pressure, cough, and loss of taste and smell. Request covid testing.

## 2020-06-07 NOTE — ED Provider Notes (Signed)
Roderic Palau    CSN: [id number] Arrival date & time: 06/07/20  0935      History   Chief Complaint Chief Complaint  Patient presents with  . Headache  . loss of taste and smell  . Cough    HPI Heather [name] is a 44 y.o. female  Presenting for Covid testing.  Provides history: Endorsing "bad headache ", sinus pressure, mildly productive cough, loss of taste and smell.  No fever, vomiting, chest pain or palpitations.  Has been doing albuterol nebulizer at home with some relief.  Does not need refill at this time.  Denies worst negative life, thunderclap headache, numbness or tingling.  Past Medical History:  Diagnosis Date  . Asthma   . Depression    bipolar  . Fainting spell    H/O  . H/O cardiac arrhythmia   . Migraines   . Seizures [facility])     Patient Active Problem List   Diagnosis Date Noted  . Bronchitis 09/22/2019  . Herpes zoster without complication 75/64/3329  . GERD (gastroesophageal reflux disease) 01/05/2019  . Back pain 12/21/2018  . Leukocytosis 11/21/2018  . Abdominal pain 11/19/2018  . Conjunctivitis 05/23/2018  . Sleeping difficulty 10/31/2017  . Rash 06/11/2017  . Health care maintenance 10/12/2016  . History of miscarriage 10/12/2016  . Hypercholesterolemia 07/09/2016  . Migraine 07/20/2013  . Seizure disorder ([place]) 07/20/2013  . Asthma 07/20/2013  . Alternating constipation and diarrhea 07/20/2013    Past Surgical History:  Procedure Laterality Date  . TUBAL LIGATION Bilateral 09/2002    OB History   No obstetric history on file.      Home Medications    Prior to Admission medications   Medication Sig Start Date End Date Taking? Authorizing Provider  albuterol (VENTOLIN HFA) 108 (90 Base) MCG/ACT inhaler Inhale 1-2 puffs into the lungs every 4 (four) hours as needed for wheezing or shortness of breath. 04/30/20   McLean-Scocuzza, Nino Glow, MD  benzonatate (TESSALON) 100 MG capsule Take 1 capsule (100 mg total) by mouth  every 8 (eight) hours. 06/07/20   Hall-Potvin, [place], PA-C  budesonide-formoterol (SYMBICORT) 160-4.5 MCG/ACT inhaler Inhale 2 puffs into the lungs 2 (two) times daily. Rinse mouth 04/30/20   McLean-Scocuzza, Nino Glow, MD  cetirizine (ZYRTEC ALLERGY) 10 MG tablet Take 1 tablet (10 mg total) by mouth daily. 06/07/20   Hall-Potvin, [place], PA-C  diazepam (VALIUM) 10 MG tablet TAKE 1 TABLET BY MOUTH 4 TIMES DAILY AS NEEDED 02/13/19   [provider]  famotidine (PEPCID) 20 MG tablet Take 1 tablet (20 mg total) by mouth daily. 01/02/19   Einar Pheasant, MD  fluticasone (FLONASE) 50 MCG/ACT nasal spray Place 1 spray into both nostrils daily. 06/07/20   Hall-Potvin, [place], PA-C  ipratropium-albuterol (DUONEB) 0.5-2.5 (3) MG/3ML SOLN Inhale 3 mLs into the lungs every 4 (four) hours as needed. 04/30/20   McLean-Scocuzza, Nino Glow, MD  Magnesium 200 MG TABS Take by mouth.    [provider]  montelukast (SINGULAIR) 10 MG tablet Take 1 tablet (10 mg total) by mouth at bedtime. 04/30/20   McLean-Scocuzza, Nino Glow, MD  nortriptyline (PAMELOR) 10 MG capsule Take 30 mg nightly for two weeks then increase to 40 mg at night and continue that dose 02/04/19   [provider]  predniSONE (DELTASONE) 50 MG tablet Take 1 tablet (50 mg total) by mouth daily with breakfast. 06/07/20   Hall-Potvin, [place], PA-C  promethazine-dextromethorphan (PROMETHAZINE-DM) 6.25-15 MG/5ML syrup Take 5 mLs by mouth 4 (  four) times daily as needed for cough. 09/12/19   Enrique Sack, FNP  rizatriptan (MAXALT-MLT) 10 MG disintegrating tablet TAKE ONE TABLET BY MOUTH ONCE AS NEEDED FOR MIGRAINE FOR UP TO ONE DOSE. MAY TAKE A SECOND DOSE AFTER TWO HOURS IF NEEDED. 09/10/19   [provider]  traZODone (DESYREL) 100 MG tablet Take 1 tablet (100 mg total) by mouth at bedtime. 11/06/19   Einar Pheasant, MD  triamcinolone cream (KENALOG) 0.1 % Apply 1 application topically 2 (two) times daily. 03/12/19   Einar Pheasant, MD    Family History Family History  Problem Relation Age of Onset  . Hypertension Mother   . Diabetes Mother   . Arthritis Mother   . Heart disease Mother   . Breast cancer Neg Hx     Social History Social History   Tobacco Use  . Smoking status: Former Smoker    Packs/day: 0.50    Types: Cigarettes  . Smokeless tobacco: Never Used  . Tobacco comment: Quit mid October 2014  Substance Use Topics  . Alcohol use: No    Alcohol/week: 0.0 standard drinks  . Drug use: No     Allergies   Amoxicillin, Augmentin [amoxicillin-pot clavulanate], Erythromycin, Levaquin [levofloxacin], Lithium, and Tramadol   Review of Systems As per HPI   Physical Exam Triage Vital Signs ED Triage Vitals  Enc Vitals Group     BP 06/07/20 1032 134/90     Pulse Rate 06/07/20 1032 90     Resp 06/07/20 1032 18     Temp 06/07/20 1032 98.2 F (36.8 C)     Temp src --      SpO2 06/07/20 1032 98 %     Weight --      Height --      Head Circumference --      Peak Flow --      Pain Score 06/07/20 1030 7     Pain Loc --      Pain Edu? --      Excl. in [place]? --    No data found.  Updated Vital Signs BP 134/90   Pulse 90   Temp 98.2 F (36.8 C)   Resp 18   SpO2 98%   Visual Acuity Right Eye Distance:   Left Eye Distance:   Bilateral Distance:    Right Eye Near:   Left Eye Near:    Bilateral Near:     Physical Exam Constitutional:      General: She is not in acute distress.    Appearance: She is obese. She is not ill-appearing or diaphoretic.  HENT:     Head: Normocephalic and atraumatic.     Right Ear: Tympanic membrane, ear canal and external ear normal.     Left Ear: Tympanic membrane, ear canal and external ear normal.     Mouth/Throat:     Mouth: Mucous membranes are moist.     Pharynx: Oropharynx is clear. No oropharyngeal exudate or posterior oropharyngeal erythema.  Eyes:     General: No scleral icterus.    Extraocular Movements: Extraocular movements  intact.     Conjunctiva/sclera: Conjunctivae normal.     Pupils: Pupils are equal, round, and reactive to light.  Neck:     Comments: Trachea midline, negative JVD Cardiovascular:     Rate and Rhythm: Normal rate and regular rhythm.     Heart sounds: No murmur heard.  No gallop.   Pulmonary:     Effort: Pulmonary effort is  normal. No respiratory distress.     Breath sounds: No stridor. Wheezing and rhonchi present. No rales.     Comments: Scattered/mild.  No prolonged exp phase Musculoskeletal:        General: No deformity. Normal range of motion.     Cervical back: Normal range of motion and neck supple. No rigidity or tenderness.  Lymphadenopathy:     Cervical: No cervical adenopathy.  Skin:    Capillary Refill: Capillary refill takes less than 2 seconds.     Coloration: Skin is not jaundiced or pale.     Findings: No bruising or rash.  Neurological:     General: No focal deficit present.     Mental Status: [name] and oriented to person, place, and time.     Cranial Nerves: Cranial nerves are intact.     Sensory: Sensation is intact.     Motor: Motor function is intact.     Coordination: Coordination is intact.     Gait: Gait is intact.  Psychiatric:        Mood and Affect: Mood normal.        Behavior: Behavior normal.      UC Treatments / Results  Labs (all labs ordered are listed, but only abnormal results are displayed) Labs Reviewed  NOVEL CORONAVIRUS, NAA    EKG   Radiology No results found.  Procedures Procedures (including critical care time)  Medications Ordered in UC Medications  ketorolac (TORADOL) 30 MG/ML injection 30 mg (has no administration in time range)  dexamethasone (DECADRON) injection 10 mg (has no administration in time range)  ondansetron (ZOFRAN-ODT) disintegrating tablet 4 mg (has no administration in time range)    Initial Impression / Assessment and Plan / UC Course  I have reviewed the triage vital signs and the nursing  notes.  Pertinent labs & imaging results that were available during my care of the patient were reviewed by me and considered in my medical decision making (see chart for details).     Patient afebrile, nontoxic, with SpO2 98%.  Given HA cocktail as above, tolerated well.  Covid PCR pending.  Patient to quarantine until results are back.  We will treat supportively as outlined below.  Return precautions discussed, patient verbalized understanding and is agreeable to plan. Final Clinical Impressions(s) / UC Diagnoses   Final diagnoses:  URI with cough and congestion  Suspected COVID-19 virus infection  Bad headache     Discharge Instructions     Your COVID test is pending - it is important to quarantine / isolate at home until your results are back. If you test positive and would like further evaluation for persistent or worsening symptoms, you may schedule an E-visit or virtual (video) visit throughout the [facility] app or website.  PLEASE NOTE: If you develop severe chest pain or shortness of breath please go to the ER or call 9-1-1 for further evaluation --> DO NOT schedule electronic or virtual visits for this. Please call our office for further guidance / recommendations as needed.  For information about the Covid vaccine, please visit [email or web address]    ED Prescriptions    Medication Sig Dispense Auth. Provider   benzonatate (TESSALON) 100 MG capsule Take 1 capsule (100 mg total) by mouth every 8 (eight) hours. 21 capsule Hall-Potvin, [place], PA-C   cetirizine (ZYRTEC ALLERGY) 10 MG tablet Take 1 tablet (10 mg total) by mouth daily. 30 tablet Hall-Potvin, [place], PA-C   fluticasone (FLONASE) 50 MCG/ACT nasal  spray Place 1 spray into both nostrils daily. 16 g Hall-Potvin, [place], PA-C   predniSONE (DELTASONE) 50 MG tablet Take 1 tablet (50 mg total) by mouth daily with breakfast. 5 tablet Hall-Potvin, [place], PA-C     PDMP not reviewed this  encounter.   Hall-Potvin, [place], [place] 06/07/20 1121

## 2020-06-07 NOTE — Discharge Instructions (Addendum)
Your COVID test is pending - it is important to quarantine / isolate at home until your results are back. °If you test positive and would like further evaluation for persistent or worsening symptoms, you may schedule an E-visit or virtual (video) visit throughout the [facility] MyChart app or website. ° °PLEASE NOTE: If you develop severe chest pain or shortness of breath please go to the ER or call 9-1-1 for further evaluation --> DO NOT schedule electronic or virtual visits for this. °Please call our office for further guidance / recommendations as needed. ° °For information about the Covid vaccine, please visit [email or web address] °

## 2020-06-08 NOTE — Telephone Encounter (Signed)
Please call pt and see how she is doing.  She was seen in UC yesterday.  Her covid test is pending.  Needs to continue to quarantine.  What symptoms is she having now and what is she taking?  Also, when did symptoms start?  Has she been vaccinated (covid vaccine)?

## 2020-06-09 ENCOUNTER — Encounter: Payer: Self-pay | Admitting: Internal Medicine

## 2020-06-09 LAB — NOVEL CORONAVIRUS, NAA: SARS-CoV-2, NAA: DETECTED — AB

## 2020-06-09 LAB — SARS-COV-2, NAA 2 DAY TAT

## 2020-06-09 NOTE — Telephone Encounter (Signed)
Left a message to call back.

## 2020-06-10 ENCOUNTER — Telehealth (INDEPENDENT_AMBULATORY_CARE_PROVIDER_SITE_OTHER): Payer: BC Managed Care – PPO | Admitting: Internal Medicine

## 2020-06-10 ENCOUNTER — Encounter: Payer: Self-pay | Admitting: Internal Medicine

## 2020-06-10 ENCOUNTER — Telehealth: Payer: Self-pay | Admitting: Internal Medicine

## 2020-06-10 DIAGNOSIS — U071 COVID-19: Secondary | ICD-10-CM

## 2020-06-10 DIAGNOSIS — Z8616 Personal history of COVID-19: Secondary | ICD-10-CM

## 2020-06-10 DIAGNOSIS — R059 Cough, unspecified: Secondary | ICD-10-CM

## 2020-06-10 DIAGNOSIS — J4520 Mild intermittent asthma, uncomplicated: Secondary | ICD-10-CM

## 2020-06-10 MED ORDER — PREDNISONE 10 MG PO TABS: ORAL_TABLET | ORAL | 0 refills | Status: DC

## 2020-06-10 NOTE — Telephone Encounter (Signed)
Patient was called and scheduled for a video visit on 06/10/20 with Dr. Nicki Reaper.

## 2020-06-10 NOTE — Progress Notes (Signed)
Patient ID: Heather [name], female   DOB: December 22, 1975, 44 y.o.   MRN: [id number]   Virtual Visit via video Note  This visit type was conducted due to national recommendations for restrictions regarding the COVID-19 pandemic (e.g. social distancing).  This format is felt to be most appropriate for this patient at this time.  All issues noted in this document were discussed and addressed.  No physical exam was performed (except for noted visual exam findings with Video Visits).   I connected with Heather [name] by a video enabled telemedicine application and verified that I am speaking with the correct person using two identifiers. Location patient: home Location provider: work Persons participating in the virtual visit: patient, provider  The limitations, risks, security and privacy concerns of performing an evaluation and management service by video and the availability of in person appointments have been discussed.  It has also been discussed with the patient that there may be a patient responsible charge related to this service. The patient expressed understanding and agreed to proceed.   Reason for visit: work in appt  HPI: Work in appt with congestion and cough.  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  She started developing headache.  Described maxillary sinus burning.  Took migraine medication.  Did not help. Temp 100.1/cough.  Then developed pain in her back.  Felt was related to increased cough.  Developed loss of taste and smell.  Seen at urgent care.  Treated with five days of prednisone.  Given injection of steroid.  Continues with loss of taste and smell.  She is eating and trying to stay hydrated.  Still with increased cough, occasionally productive of thick mucus.  Chest feels tight at times.  Using nebulizer and inhaler.  No increased sob, except for when she first wakes up.  Pulse ox averaging 96-98%.  97% on this visit.  No abdominal pain or diarrhea.  Did  receive notice that her covid test was positive.  Discussed monoclonal antibody infusion and quarantine.     ROS: See pertinent positives and negatives per HPI.  Past Medical History:  Diagnosis Date  . Asthma   . Depression    bipolar  . Fainting spell    H/O  . H/O cardiac arrhythmia   . Migraines   . Seizures ([place])     Past Surgical History:  Procedure Laterality Date  . TUBAL LIGATION Bilateral 09/2002    Family History  Problem Relation Age of Onset  . Hypertension Mother   . Diabetes Mother   . Arthritis Mother   . Heart disease Mother   . Breast cancer Neg Hx     SOCIAL HX: reviewed.   Current Outpatient Medications:  .  albuterol (VENTOLIN HFA) 108 (90 Base) MCG/ACT inhaler, Inhale 1-2 puffs into the lungs every 4 (four) hours as needed for wheezing or shortness of breath., Disp: 18 g, Rfl: 11 .  benzonatate (TESSALON) 100 MG capsule, Take 1 capsule (100 mg total) by mouth every 8 (eight) hours., Disp: 21 capsule, Rfl: 0 .  budesonide-formoterol (SYMBICORT) 160-4.5 MCG/ACT inhaler, Inhale 2 puffs into the lungs 2 (two) times daily. Rinse mouth, Disp: 10.2 g, Rfl: 11 .  cetirizine (ZYRTEC ALLERGY) 10 MG tablet, Take 1 tablet (10 mg total) by mouth daily., Disp: 30 tablet, Rfl: 0 .  diazepam (VALIUM) 10 MG tablet, TAKE 1 TABLET BY MOUTH 4 TIMES DAILY AS NEEDED, Disp: , Rfl:  .  famotidine (PEPCID) 20  MG tablet, Take 1 tablet (20 mg total) by mouth daily., Disp: 30 tablet, Rfl: 1 .  fluticasone (FLONASE) 50 MCG/ACT nasal spray, Place 1 spray into both nostrils daily., Disp: 16 g, Rfl: 0 .  ipratropium-albuterol (DUONEB) 0.5-2.5 (3) MG/3ML SOLN, Inhale 3 mLs into the lungs every 4 (four) hours as needed., Disp: 360 mL, Rfl: 11 .  Magnesium 200 MG TABS, Take by mouth., Disp: , Rfl:  .  montelukast (SINGULAIR) 10 MG tablet, Take 1 tablet (10 mg total) by mouth at bedtime., Disp: 90 tablet, Rfl: 3 .  nortriptyline (PAMELOR) 10 MG capsule, Take 30 mg nightly for two weeks  then increase to 40 mg at night and continue that dose, Disp: , Rfl:  .  promethazine-dextromethorphan (PROMETHAZINE-DM) 6.25-15 MG/5ML syrup, Take 5 mLs by mouth 4 (four) times daily as needed for cough., Disp: 118 mL, Rfl: 0 .  rizatriptan (MAXALT-MLT) 10 MG disintegrating tablet, TAKE ONE TABLET BY MOUTH ONCE AS NEEDED FOR MIGRAINE FOR UP TO ONE DOSE. MAY TAKE A SECOND DOSE AFTER TWO HOURS IF NEEDED., Disp: , Rfl:  .  traZODone (DESYREL) 100 MG tablet, Take 1 tablet (100 mg total) by mouth at bedtime., Disp: 30 tablet, Rfl: 1 .  triamcinolone cream (KENALOG) 0.1 %, Apply 1 application topically 2 (two) times daily., Disp: 30 g, Rfl: 0 .  predniSONE (DELTASONE) 10 MG tablet, Take 6 tablets x 1 day and then decrease by 1/2 tablet per day until down to zero mg., Disp: 39 tablet, Rfl: 0  EXAM:  GENERAL: alert, oriented, appears well and in no acute distress  HEENT: atraumatic, conjunttiva clear, no obvious abnormalities on inspection of external nose and ears  NECK: normal movements of the head and neck  LUNGS: on inspection no signs of respiratory distress, breathing rate appears normal, no obvious gross SOB, gasping or wheezing  CV: no obvious cyanosis  PSYCH/NEURO: pleasant and cooperative, no obvious depression or anxiety, speech and thought processing grossly intact  ASSESSMENT AND PLAN:  Discussed the following assessment and plan:  COVID-19 virus infection Diagnosed with covid.  Has a history of asthma.  Has been using her inhalers, nebulizer.  Taking mucinex.  Using flonase.  Taking prednisone. Was given a 5 day course.  Feels needs a longer taper.  Pulse ox 97%.  No increased sob currently.  Discussed following symptoms closely.  Discussed the need for evaluation if any worsening or progression.  rx sent in for prednisone taper. Discussed continued quarantine.  Also discussed monoclonal antibody infusion. She messaged me back and informed me she was agreeable. Has been contacted and  plans to notify she is agreeable.    No orders of the defined types were placed in this encounter.   Meds ordered this encounter  Medications  . predniSONE (DELTASONE) 10 MG tablet    Sig: Take 6 tablets x 1 day and then decrease by 1/2 tablet per day until down to zero mg.    Dispense:  39 tablet    Refill:  0     I discussed the assessment and treatment plan with the patient. The patient was provided an opportunity to ask questions and all were answered. The patient agreed with the plan and demonstrated an understanding of the instructions.   The patient was advised to call back or seek an in-person evaluation if the symptoms worsen or if the condition fails to improve as anticipated.   Einar Pheasant, MD

## 2020-06-10 NOTE — Telephone Encounter (Signed)
Patient was scheduled for a video visit for 4:30.

## 2020-06-10 NOTE — Telephone Encounter (Signed)
My chart message sent to pt regarding update.  

## 2020-06-10 NOTE — Telephone Encounter (Signed)
Please schedule a virtual visit with me today at 4:30.

## 2020-06-10 NOTE — Telephone Encounter (Signed)
Called to Discuss with patient about Covid symptoms and the use of the monoclonal antibody infusion for those with mild to moderate Covid symptoms and at a high risk of hospitalization.     Pt appears to qualify for this infusion due to co-morbid conditions and/or a member of an at-risk group in accordance with the FDA Emergency Use Authorization. Pt with BMI >25 and hx of seizures. Would need to determine onset of symptoms.    Will send MyChart message with [facility] infusion hotline number if pt interested.   Nemiah Commander, NP [facility] hotline [phone]

## 2020-06-11 ENCOUNTER — Telehealth: Payer: Self-pay | Admitting: Internal Medicine

## 2020-06-11 ENCOUNTER — Other Ambulatory Visit (HOSPITAL_COMMUNITY): Payer: Self-pay | Admitting: Nurse Practitioner

## 2020-06-11 ENCOUNTER — Encounter: Payer: Self-pay | Admitting: Internal Medicine

## 2020-06-11 DIAGNOSIS — U071 COVID-19: Secondary | ICD-10-CM

## 2020-06-11 DIAGNOSIS — J4520 Mild intermittent asthma, uncomplicated: Secondary | ICD-10-CM

## 2020-06-11 DIAGNOSIS — Z8616 Personal history of COVID-19: Secondary | ICD-10-CM | POA: Insufficient documentation

## 2020-06-11 NOTE — Assessment & Plan Note (Signed)
Diagnosed with covid.  Has a history of asthma.  Has been using her inhalers, nebulizer.  Taking mucinex.  Using flonase.  Taking prednisone. Was given a 5 day course.  Feels needs a longer taper.  Pulse ox 97%.  No increased sob currently.  Discussed following symptoms closely.  Discussed the need for evaluation if any worsening or progression.  rx sent in for prednisone taper. Discussed continued quarantine.  Also discussed monoclonal antibody infusion. She messaged me back and informed me she was agreeable. Has been contacted and plans to notify she is agreeable.

## 2020-06-11 NOTE — Progress Notes (Signed)
I connected by phone with Heather [name] on 06/11/2020 at 9:34 AM to discuss the potential use of a new treatment for mild to moderate COVID-19 viral infection in non-hospitalized patients.  This patient is a 44 y.o. female that meets the FDA criteria for Emergency Use Authorization of COVID monoclonal antibody casirivimab/imdevimab.  Has a (+) direct SARS-CoV-2 viral test result  Has mild or moderate COVID-19   Is NOT hospitalized due to COVID-19  Is within 10 days of symptom onset 06/05/20  Has at least one of the high risk factor(s) for progression to severe COVID-19 and/or hospitalization as defined in EUA.  Specific high risk criteria : BMI > 25 and Chronic Lung Disease   I have spoken and communicated the following to the patient or parent/caregiver regarding COVID monoclonal antibody treatment:  1. FDA has authorized the emergency use for the treatment of mild to moderate COVID-19 in adults and pediatric patients with positive results of direct SARS-CoV-2 viral testing who are 78 years of age and older weighing at least 40 kg, and who are at high risk for progressing to severe COVID-19 and/or hospitalization.  2. The significant known and potential risks and benefits of COVID monoclonal antibody, and the extent to which such potential risks and benefits are unknown.  3. Information on available alternative treatments and the risks and benefits of those alternatives, including clinical trials.  4. Patients treated with COVID monoclonal antibody should continue to self-isolate and use infection control measures (e.g., wear mask, isolate, social distance, avoid sharing personal items, clean and disinfect "high touch" surfaces, and frequent handwashing) according to CDC guidelines.   5. The patient or parent/caregiver has the option to accept or refuse COVID monoclonal antibody treatment.  After reviewing this information with the patient, The patient agreed to proceed with receiving  casirivimab\imdevimab infusion and will be provided a copy of the Fact sheet prior to receiving the infusion. Heather [name] 06/11/2020 9:34 AM

## 2020-06-11 NOTE — Telephone Encounter (Signed)
Tried to reach out due to BPA on COVID questionnaire, unable to reach, number left to call back if advice needed.

## 2020-06-12 ENCOUNTER — Telehealth: Payer: Self-pay

## 2020-06-12 ENCOUNTER — Ambulatory Visit (HOSPITAL_COMMUNITY)
Admission: RE | Admit: 2020-06-12 | Discharge: 2020-06-12 | Disposition: A | Discharge status: Home / Self Care | Payer: BC Managed Care – PPO | Source: Ambulatory Visit | Attending: Pulmonary Disease | Admitting: Pulmonary Disease

## 2020-06-12 DIAGNOSIS — J4520 Mild intermittent asthma, uncomplicated: Secondary | ICD-10-CM | POA: Insufficient documentation

## 2020-06-12 DIAGNOSIS — U071 COVID-19: Secondary | ICD-10-CM | POA: Diagnosis present

## 2020-06-12 MED ORDER — FAMOTIDINE IN NACL 20-0.9 MG/50ML-% IV SOLN: 20.0000 mg | Freq: Once | INTRAVENOUS | Status: DC | PRN

## 2020-06-12 MED ORDER — METHYLPREDNISOLONE SODIUM SUCC 125 MG IJ SOLR: 125.0000 mg | Freq: Once | INTRAMUSCULAR | Status: DC | PRN

## 2020-06-12 MED ORDER — ONDANSETRON HCL 4 MG/2ML IJ SOLN
4.0000 mg | Freq: Once | INTRAMUSCULAR | Status: AC
  Administered 2020-06-12: 4 mg via INTRAVENOUS
  Filled 2020-06-12: qty 2

## 2020-06-12 MED ORDER — SODIUM CHLORIDE 0.9 % IV SOLN
1200.0000 mg | Freq: Once | INTRAVENOUS | Status: AC
  Administered 2020-06-12: 1200 mg via INTRAVENOUS

## 2020-06-12 MED ORDER — EPINEPHRINE 0.3 MG/0.3ML IJ SOAJ: 0.3000 mg | Freq: Once | INTRAMUSCULAR | Status: DC | PRN

## 2020-06-12 MED ORDER — DIPHENHYDRAMINE HCL 50 MG/ML IJ SOLN: 50.0000 mg | Freq: Once | INTRAMUSCULAR | Status: DC | PRN

## 2020-06-12 MED ORDER — ALBUTEROL SULFATE HFA 108 (90 BASE) MCG/ACT IN AERS: 2.0000 | INHALATION_SPRAY | Freq: Once | RESPIRATORY_TRACT | Status: DC | PRN

## 2020-06-12 MED ORDER — SODIUM CHLORIDE 0.9 % IV SOLN: INTRAVENOUS | Status: DC | PRN

## 2020-06-12 NOTE — Discharge Instructions (Signed)

## 2020-06-12 NOTE — Telephone Encounter (Signed)
Called pt (who was sitting at infusion center waiting to be seen.) Denies diarrhea.  Pt stated she has loss her sense of taste and smell but is drinking fluids. Pt is coughing up phlegm that is white to clear to Yellow brown in color. Pt is using her Albuterol MDI and pt states it helps. Denies fever but + body aches. Pt advised to:  If cough remains the same or better: continue to treat with over the counter medications.  Hard candy or cough drops and drinking warm fluids. Adults can also use honey 2 tsp (10 ML) at bedtime.   Honey is not recommended for infants under one.  Pt stated that they were calling her name for her infusion and conversation ended.

## 2020-06-12 NOTE — Progress Notes (Signed)
°  Diagnosis: COVID-19  Physician: Asencion Noble, MD  Procedure: Covid Infusion Clinic Med: casirivimab\imdevimab infusion - Provided patient with casirivimab\imdevimab fact sheet for patients, parents and caregivers prior to infusion.  Complications: No immediate complications noted.  Discharge: Discharged home   Janne Napoleon 06/12/2020

## 2020-06-14 ENCOUNTER — Telehealth: Payer: Self-pay

## 2020-06-14 NOTE — Telephone Encounter (Signed)
Patient 02 sats or at 96 to 97 % right now, and the breathing gets better with breathing treatment , she has been using robitussin and mucinex,  DX with CoVID on 06/07/20, patient  Coughing seems to be aggrevated with talking and exertion.

## 2020-06-14 NOTE — Telephone Encounter (Signed)
I copied you on this message as well - if acute sob, then agree with ER evaluation.  See note, for respiratory clinic.

## 2020-06-14 NOTE — Telephone Encounter (Signed)
If acute worsening sob, then do agree with ER evaluation.  If she feel symptoms are stable, but still with cough, etc, then I recommend getting her in with respiratory clinic.  Please call [phone] to schedule her appt if agreeable to be seen.

## 2020-06-14 NOTE — Telephone Encounter (Signed)
Spoke with patient and she states she feels stable and agrees to be seen at the respiratory clinic. I called clinic and set up an appt for patient for 06/16/20 at 8:00 am. Called patient back and she is aware of her appt.

## 2020-06-14 NOTE — Telephone Encounter (Signed)
Patient called due to her response for WORSENING SOB on her MyChart COVID questionnaire below. She says that she's having SOB off and on, but it clears with her breathing treatments. She says she has asthma and has been sending her doctor messages. I advised to go to the ED if SOB is not getting better after the breathing treatments, she verbalized understanding.   [facility] Mychart Covid-19 Condition Monitoring  Question 06/14/2020 2:22 PM EDT - Filed by Patient  Are you feeling short of breath today? Yes  Is the shortness of breath better, the same, or worse than yesterday? WorseAbnormal  Are you having a cough today? Yes  Is the cough better, the same, or worse than yesterday?  Same  Are you experiencing weakness today? Yes  Is the weakness better, the same, or worse than yesterday? Same  Are you vomiting? No  How is your appetite compared to yesterday? Same  Are you experiencing diarrhea?  No

## 2020-06-16 ENCOUNTER — Telehealth: Payer: Self-pay | Admitting: Internal Medicine

## 2020-06-16 ENCOUNTER — Ambulatory Visit (INDEPENDENT_AMBULATORY_CARE_PROVIDER_SITE_OTHER): Payer: BC Managed Care – PPO | Admitting: Nurse Practitioner

## 2020-06-16 ENCOUNTER — Ambulatory Visit (HOSPITAL_COMMUNITY)
Admission: RE | Admit: 2020-06-16 | Discharge: 2020-06-16 | Disposition: A | Discharge status: Home / Self Care | Payer: BC Managed Care – PPO | Source: Ambulatory Visit | Attending: Nurse Practitioner | Admitting: Nurse Practitioner

## 2020-06-16 ENCOUNTER — Other Ambulatory Visit: Payer: Self-pay

## 2020-06-16 VITALS — BP 108/62 | HR 85 | Temp 97.5°F | Ht 64.0 in | Wt 183.0 lb

## 2020-06-16 DIAGNOSIS — R0602 Shortness of breath: Secondary | ICD-10-CM

## 2020-06-16 DIAGNOSIS — U071 COVID-19: Secondary | ICD-10-CM | POA: Diagnosis not present

## 2020-06-16 MED ORDER — AZITHROMYCIN 250 MG PO TABS: ORAL_TABLET | ORAL | 0 refills | Status: DC

## 2020-06-16 MED ORDER — TIZANIDINE HCL 4 MG PO TABS: 4.0000 mg | ORAL_TABLET | Freq: Four times a day (QID) | ORAL | 0 refills | Status: DC | PRN

## 2020-06-16 NOTE — Telephone Encounter (Addendum)
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 Had Bowel movement around 2:00 to 3:00 PM.  Patient stated she feels better now but still  Feels like she might be constipated. Patient normally takes dicyclomine 10 mg liquid . Has some on hand . Patient asking what to do for constipation.

## 2020-06-16 NOTE — Addendum Note (Signed)
Addended by: Fenton Foy on: 06/16/2020 01:34 PM   Modules accepted: Orders

## 2020-06-16 NOTE — Telephone Encounter (Signed)
Attempted to reach pt after receiving BPA via MyChart for new onset vomiting. Two attempts, line busy each attempt. Addressed in [place]: For severe vomiting (More than 6 times a day and/ or over 8 hour and/or any severe abdominal pain, please seek treatment in ED. Attempt to stay hydrated, fluids as tolerated. If you are unable to tolerate any food or fluids, call PCP.

## 2020-06-16 NOTE — Assessment & Plan Note (Signed)
Cough Shortness of breath:   Stay well hydrated  Stay active  Deep breathing exercises  May start vitamin C 2,000 mg daily, vitamin D3 2,000 IU daily, Zinc 220 mg daily, and Quercetin 500 mg twice daily  May take tylenol or fever or pain  May take mucinex DM twice daily  Will order chest x ray  Muscle spasm back:  Warm wet compresses  Muscle relaxer   Follow up:  Follow up in 2 weeks or sooner if needed

## 2020-06-16 NOTE — Patient Instructions (Signed)
Covid 19 Cough Shortness of breath:   Stay well hydrated  Stay active  Deep breathing exercises  May start vitamin C 2,000 mg daily, vitamin D3 2,000 IU daily, Zinc 220 mg daily, and Quercetin 500 mg twice daily  May take tylenol or fever or pain  May take mucinex DM twice daily  Will order chest x ray  Muscle spasm back:  Warm wet compresses  Muscle relaxer   Follow up:  Follow up in 2 weeks or sooner if needed

## 2020-06-16 NOTE — Progress Notes (Signed)
@Patient  ID: Heather [name], female    DOB: July 25, 1976, 44 y.o.   MRN: [id number]  Chief Complaint  Patient presents with  . Covid Positive    Positive 9/15. Referred by PCP for further evalation 02 94-97% at home. Still has a cough was just given prednisone by PCP    Referring provider: Einar Pheasant, MD   44 year old female with history of asthma, migraines, GERD, seizure disorder, depression, hypercholesterolemia. Diagnosed with Covid on 06/09/2020.  HPI  Patient presents today for post COVID care clinic visit. She was diagnosed with Covid on 06/09/2020. She did receive monoclonal antibody infusion. Patient does have a history of asthma and states that she is having ongoing cough and shortness of breath. She is currently completing a round of prednisone. She is compliant with her inhalers. She is currently on albuterol, Symbicort, Singulair, and nebulizers as needed. She states that she does have muscle spasm in her back from coughing. Denies f/c/s, n/v/d, hemoptysis, PND, chest pain or edema.      Allergies  Allergen Reactions  . Amoxicillin Nausea And Vomiting  . Augmentin [Amoxicillin-Pot Clavulanate]   . Erythromycin Nausea And Vomiting  . Levaquin [Levofloxacin] Swelling  . Lithium Nausea Only  . Tramadol     Seizures    Immunization History  Administered Date(s) Administered  . PPD Test 07/20/2015    Past Medical History:  Diagnosis Date  . Asthma   . Depression    bipolar  . Fainting spell    H/O  . H/O cardiac arrhythmia   . Migraines   . Seizures ([place])     Tobacco History: Social History   Tobacco Use  Smoking Status Former Smoker  . Packs/day: 0.50  . Types: Cigarettes  Smokeless Tobacco Never Used  Tobacco Comment   Quit mid October 2014   Counseling given: Yes Comment: Quit mid October 2014   Outpatient Encounter Medications as of 06/16/2020  Medication Sig  . albuterol (VENTOLIN HFA) 108 (90 Base) MCG/ACT inhaler Inhale 1-2 puffs into  the lungs every 4 (four) hours as needed for wheezing or shortness of breath.  . budesonide-formoterol (SYMBICORT) 160-4.5 MCG/ACT inhaler Inhale 2 puffs into the lungs 2 (two) times daily. Rinse mouth  . cetirizine (ZYRTEC ALLERGY) 10 MG tablet Take 1 tablet (10 mg total) by mouth daily.  . diazepam (VALIUM) 10 MG tablet TAKE 1 TABLET BY MOUTH 4 TIMES DAILY AS NEEDED  . fluticasone (FLONASE) 50 MCG/ACT nasal spray Place 1 spray into both nostrils daily.  Marland Kitchen ipratropium-albuterol (DUONEB) 0.5-2.5 (3) MG/3ML SOLN Inhale 3 mLs into the lungs every 4 (four) hours as needed.  . montelukast (SINGULAIR) 10 MG tablet Take 1 tablet (10 mg total) by mouth at bedtime.  . predniSONE (DELTASONE) 10 MG tablet Take 6 tablets x 1 day and then decrease by 1/2 tablet per day until down to zero mg.  . rizatriptan (MAXALT-MLT) 10 MG disintegrating tablet TAKE ONE TABLET BY MOUTH ONCE AS NEEDED FOR MIGRAINE FOR UP TO ONE DOSE. MAY TAKE A SECOND DOSE AFTER TWO HOURS IF NEEDED.  Marland Kitchen traZODone (DESYREL) 100 MG tablet Take 1 tablet (100 mg total) by mouth at bedtime.  Marland Kitchen azithromycin (ZITHROMAX) 250 MG tablet Take 2 tablets (500 mg) on day 1, then take 1 tablet (250 mg) on days 2-5  . benzonatate (TESSALON) 100 MG capsule Take 1 capsule (100 mg total) by mouth every 8 (eight) hours. (Patient not taking: Reported on 06/16/2020)  . famotidine (PEPCID) 20 MG tablet Take  1 tablet (20 mg total) by mouth daily. (Patient not taking: Reported on 06/16/2020)  . Magnesium 200 MG TABS Take by mouth. (Patient not taking: Reported on 06/16/2020)  . nortriptyline (PAMELOR) 10 MG capsule Take 30 mg nightly for two weeks then increase to 40 mg at night and continue that dose (Patient not taking: Reported on 06/16/2020)  . promethazine-dextromethorphan (PROMETHAZINE-DM) 6.25-15 MG/5ML syrup Take 5 mLs by mouth 4 (four) times daily as needed for cough. (Patient not taking: Reported on 06/16/2020)  . tiZANidine (ZANAFLEX) 4 MG tablet Take 1 tablet (4  mg total) by mouth every 6 (six) hours as needed for muscle spasms.  Marland Kitchen triamcinolone cream (KENALOG) 0.1 % Apply 1 application topically 2 (two) times daily. (Patient not taking: Reported on 06/16/2020)   No facility-administered encounter medications on file as of 06/16/2020.     Review of Systems  Review of Systems  Constitutional: Negative.  Negative for chills and fever.  HENT: Negative.   Respiratory: Positive for cough, shortness of breath and wheezing.   Cardiovascular: Negative.  Negative for chest pain, palpitations and leg swelling.  Gastrointestinal: Negative.   Allergic/Immunologic: Negative.   Neurological: Negative.   Psychiatric/Behavioral: Negative.        Physical Exam  BP 108/62 (BP Location: Left Arm)   Pulse 85   Temp (!) 97.5 F (36.4 C)   Ht 5\' 4"  (1.626 m)   Wt 183 lb (83 kg)   LMP 05/26/2020   SpO2 98%   BMI 31.41 kg/m   Wt Readings from Last 5 Encounters:  06/16/20 183 lb (83 kg)  04/30/20 153 lb (69.4 kg)  07/31/19 174 lb (78.9 kg)  12/10/18 174 lb 12.8 oz (79.3 kg)  11/21/18 174 lb 3.2 oz (79 kg)     Physical Exam Vitals and nursing note reviewed.  Constitutional:      General: She is not in acute distress.    Appearance: She is well-developed.  Cardiovascular:     Rate and Rhythm: Normal rate and regular rhythm.  Pulmonary:     Effort: Pulmonary effort is normal.     Breath sounds: Normal breath sounds.  Musculoskeletal:     Right lower leg: No edema.     Left lower leg: No edema.  Neurological:     Mental Status: [name] and oriented to person, place, and time.  Psychiatric:        Mood and Affect: Mood normal.        Behavior: Behavior normal.       Assessment & Plan:   COVID-19 virus infection Cough Shortness of breath:   Stay well hydrated  Stay active  Deep breathing exercises  May start vitamin C 2,000 mg daily, vitamin D3 2,000 IU daily, Zinc 220 mg daily, and Quercetin 500 mg twice daily  May take  tylenol or fever or pain  May take mucinex DM twice daily  Will order chest x ray  Muscle spasm back:  Warm wet compresses  Muscle relaxer   Follow up:  Follow up in 2 weeks or sooner if needed      Fenton Foy, NP 06/16/2020

## 2020-06-16 NOTE — Telephone Encounter (Signed)
Ok to use dicyclomine for cramps  Gentlest laxative is miralax or metamucil.  She can use these daily  Next stronger would be MOM

## 2020-06-16 NOTE — Telephone Encounter (Signed)
See messages.  Dr Derrel Nip covering.  Please call and confirm pt doing better.

## 2020-06-16 NOTE — Telephone Encounter (Signed)
Patient advised and voiced understanding in quarantine will  have someone drop off Metamucil at door. Patient will follow orders of dicyclomine for spasm and metamucil will attain MOM if needed and use as directed on bottle.

## 2020-06-17 NOTE — Telephone Encounter (Signed)
Pt called back returning your call Pt stated that she is doing better but its up and down

## 2020-06-17 NOTE — Telephone Encounter (Signed)
LMTCB

## 2020-06-17 NOTE — Telephone Encounter (Signed)
Pt called back returning your call °

## 2020-06-18 ENCOUNTER — Telehealth: Payer: Self-pay | Admitting: *Deleted

## 2020-06-18 NOTE — Telephone Encounter (Signed)
2nd attempt to contact pt; left message on voicemail.

## 2020-06-18 NOTE — Telephone Encounter (Signed)
Contacted pt to discuss symptoms; she says her breathing is better, but she starting feeling weaker on PM 06/17/20; the pt says started feeling worse after doing multiple things since she was feeling better; she has been resting today; pt advised to continue to rest, hydrate, and contact her PCP for further instruction; she verbalized understanding; the pt sees Dr Einar Pheasant; will route to provider to notification.

## 2020-06-18 NOTE — Telephone Encounter (Signed)
Received BPA alert on COVID questionnaire due to patient ongoing weakness; attempted to contact patient to discuss symptoms; left message on voicemail.

## 2020-06-18 NOTE — Telephone Encounter (Signed)
Agree with rest, fluids.  Confirm breathing is better.  Let me know if feels needs to be seen

## 2020-06-18 NOTE — Telephone Encounter (Signed)
Left detailed message for patient.

## 2020-06-19 ENCOUNTER — Encounter (INDEPENDENT_AMBULATORY_CARE_PROVIDER_SITE_OTHER): Payer: Self-pay

## 2020-06-20 ENCOUNTER — Encounter (INDEPENDENT_AMBULATORY_CARE_PROVIDER_SITE_OTHER): Payer: Self-pay

## 2020-06-22 ENCOUNTER — Telehealth: Payer: Self-pay | Admitting: Internal Medicine

## 2020-06-22 NOTE — Telephone Encounter (Signed)
LMTCB

## 2020-06-22 NOTE — Telephone Encounter (Signed)
Please contact pt for update.  Recently diagnosed with covid.  Also, does she have f/u planned in respiratory clinic.

## 2020-06-23 ENCOUNTER — Encounter (INDEPENDENT_AMBULATORY_CARE_PROVIDER_SITE_OTHER): Payer: Self-pay

## 2020-06-23 NOTE — Telephone Encounter (Signed)
LMTCB

## 2020-06-24 ENCOUNTER — Encounter (INDEPENDENT_AMBULATORY_CARE_PROVIDER_SITE_OTHER): Payer: Self-pay

## 2020-06-24 ENCOUNTER — Telehealth: Payer: Self-pay | Admitting: Internal Medicine

## 2020-06-24 NOTE — Telephone Encounter (Signed)
Left message for patient. Per questions that she is answering daily for the My Chart COVID Companion, patient is doing better.

## 2020-06-24 NOTE — Telephone Encounter (Signed)
Patient was returning call in reference  to covid.

## 2020-06-24 NOTE — Telephone Encounter (Signed)
See other note

## 2020-06-24 NOTE — Telephone Encounter (Signed)
Pt called back returning your call °

## 2020-06-27 NOTE — Telephone Encounter (Signed)
Order placed for pulmonary referral.  

## 2020-06-27 NOTE — Addendum Note (Signed)
Addended by: Alisa Graff on: 06/27/2020 09:11 AM   Modules accepted: Orders

## 2020-07-02 NOTE — Telephone Encounter (Signed)
LMTCB

## 2020-07-02 NOTE — Telephone Encounter (Signed)
Please call pt.  Heather [name] had asked about returning to work this weekend.  She sent an update last pm.  If she is continuing to be sob, etc with minimal activity, I would recommend holding on going back to work.  Also, the referral to pulmonary states authorized, but no appt scheduled.  Need to confirm appt scheduled.  Given history of covid and persistent sob with history of asthma, would like her to see pulmonary

## 2020-07-02 NOTE — Telephone Encounter (Signed)
Patient stated that she is feeling better. Advised of below. She is still having the SOB and has not heard from pulmonary. Advised to hold off on returning to work at this time. Has appt with Dr Nicki Reaper on 10/18.

## 2020-07-05 NOTE — Telephone Encounter (Signed)
Have her call with update over the next few days and let me know how she is doing.  Can then do a letter for release back to work.  Also, I will forward a message to [place] to ask about pulmonary appt.

## 2020-07-05 NOTE — Telephone Encounter (Signed)
Patient is aware 

## 2020-07-08 NOTE — Telephone Encounter (Signed)
I am going to need to adjust Monday pm schedule.  We can reschedule her to a new time for appt.  See me about this.  I can still get her paperwork done.    Dr Nicki Reaper

## 2020-07-08 NOTE — Telephone Encounter (Signed)
Called to screen her for appt on 10/18. She stated that she is still having SOB, fatigue,and no taste or smell  Pt is wanting to come in the office

## 2020-07-08 NOTE — Telephone Encounter (Signed)
Per note, form being faxed for her time out of work.  See her note.

## 2020-07-08 NOTE — Telephone Encounter (Signed)
She is out of the time frame for quarantine but she is still having lingering symptoms from covid that are listed below. Requesting an in office appt.

## 2020-07-09 ENCOUNTER — Telehealth: Payer: Self-pay

## 2020-07-09 NOTE — Telephone Encounter (Signed)
Please call pt as we discussed.  Thanks

## 2020-07-09 NOTE — Telephone Encounter (Signed)
LVM for the patient to call back. [name],cma  

## 2020-07-09 NOTE — Telephone Encounter (Signed)
See other note

## 2020-07-09 NOTE — Telephone Encounter (Signed)
Pt states that she was returning your call 

## 2020-07-09 NOTE — Telephone Encounter (Signed)
Pt called back again said she was returning your call from this morning. She didn't know exactly why you called. She would like a call back.

## 2020-07-09 NOTE — Telephone Encounter (Signed)
Patient coming in on Tuesday to office will call on arriavl and follow PPE protocol and isolation room.

## 2020-07-09 NOTE — Telephone Encounter (Signed)
LMTCB x2. Appt needs to be moved to Tuesday at 12 per Dr Nicki Reaper- virtual if agreeable. Can complete letter if pt needs it today to go back to work.

## 2020-07-12 ENCOUNTER — Ambulatory Visit: Payer: BC Managed Care – PPO | Admitting: Internal Medicine

## 2020-07-13 ENCOUNTER — Ambulatory Visit (INDEPENDENT_AMBULATORY_CARE_PROVIDER_SITE_OTHER): Payer: BC Managed Care – PPO | Admitting: Internal Medicine

## 2020-07-13 ENCOUNTER — Other Ambulatory Visit: Payer: Self-pay

## 2020-07-13 DIAGNOSIS — Z0289 Encounter for other administrative examinations: Secondary | ICD-10-CM | POA: Diagnosis not present

## 2020-07-13 DIAGNOSIS — J4520 Mild intermittent asthma, uncomplicated: Secondary | ICD-10-CM | POA: Diagnosis not present

## 2020-07-13 DIAGNOSIS — K219 Gastro-esophageal reflux disease without esophagitis: Secondary | ICD-10-CM | POA: Diagnosis not present

## 2020-07-13 DIAGNOSIS — Z8616 Personal history of COVID-19: Secondary | ICD-10-CM | POA: Diagnosis not present

## 2020-07-13 MED ORDER — PREDNISONE 10 MG PO TABS
ORAL_TABLET | ORAL | 0 refills | Status: DC
Start: 2020-07-13 — End: 2020-09-29

## 2020-07-13 NOTE — Progress Notes (Signed)
Patient ID: Heather [name], female   DOB: 09-Dec-1975, 44 y.o.   MRN: [id number]   Subjective:    Patient ID: Heather [name], female    DOB: [birth year], 44 y.o.   MRN: [id number]  HPI This visit occurred during the SARS-CoV-2 public health emergency.  Safety protocols were in place, including screening questions prior to the visit, additional usage of staff PPE, and extensive cleaning of exam room while observing appropriate contact time as indicated for disinfecting solutions.  Patient here for work in appt.  Diagnosed with covid 06/09/20.  Received monoclonal antibody infusion.  Has a history of asthma and has ongoing cough and congestion.  Using inhalers and nebs.  She does feel better, but still with decreased energy and fatigue.  Still with sob with exertion, but breathing has overall improved.  Still with increased cough and congestion.  Some coughing fits.  Feels like asthma flare.  Eating. No vomiting.  Bowels moving.  No fever.    Past Medical History:  Diagnosis Date  . Asthma   . Depression    bipolar  . Fainting spell    H/O  . H/O cardiac arrhythmia   . Migraines   . Seizures ([place])    Past Surgical History:  Procedure Laterality Date  . TUBAL LIGATION Bilateral 09/2002   Family History  Problem Relation Age of Onset  . Hypertension Mother   . Diabetes Mother   . Arthritis Mother   . Heart disease Mother   . Breast cancer Neg Hx    Social History   Socioeconomic History  . Marital status: Married    Spouse name: Not on file  . Number of children: Not on file  . Years of education: Not on file  . Highest education level: Not on file  Occupational History  . Not on file  Tobacco Use  . Smoking status: Former Smoker    Packs/day: 0.50    Types: Cigarettes  . Smokeless tobacco: Never Used  . Tobacco comment: Quit mid October 2014  Substance and Sexual Activity  . Alcohol use: No    Alcohol/week: 0.0 standard drinks  . Drug use: No  . Sexual activity: Not on  file  Other Topics Concern  . Not on file  Social History Narrative  . Not on file   Social Determinants of Health   Financial Resource Strain:   . Difficulty of Paying Living Expenses: Not on file  Food Insecurity:   . Worried About Charity fundraiser in the Last Year: Not on file  . Ran Out of Food in the Last Year: Not on file  Transportation Needs:   . Lack of Transportation (Medical): Not on file  . Lack of Transportation (Non-Medical): Not on file  Physical Activity:   . Days of Exercise per Week: Not on file  . Minutes of Exercise per Session: Not on file  Stress:   . Feeling of Stress : Not on file  Social Connections:   . Frequency of Communication with Friends and Family: Not on file  . Frequency of Social Gatherings with Friends and Family: Not on file  . Attends Religious Services: Not on file  . Active Member of Clubs or Organizations: Not on file  . Attends Archivist Meetings: Not on file  . Marital Status: Not on file    Outpatient Encounter Medications as of 07/13/2020  Medication Sig  . albuterol (VENTOLIN HFA) 108 (90 Base) MCG/ACT inhaler Inhale 1-2 puffs into  the lungs every 4 (four) hours as needed for wheezing or shortness of breath.  Marland Kitchen azithromycin (ZITHROMAX) 250 MG tablet Take 2 tablets (500 mg) on day 1, then take 1 tablet (250 mg) on days 2-5  . benzonatate (TESSALON) 100 MG capsule Take 1 capsule (100 mg total) by mouth every 8 (eight) hours. (Patient not taking: Reported on 06/16/2020)  . budesonide-formoterol (SYMBICORT) 160-4.5 MCG/ACT inhaler Inhale 2 puffs into the lungs 2 (two) times daily. Rinse mouth  . cetirizine (ZYRTEC ALLERGY) 10 MG tablet Take 1 tablet (10 mg total) by mouth daily.  . diazepam (VALIUM) 10 MG tablet TAKE 1 TABLET BY MOUTH 4 TIMES DAILY AS NEEDED  . famotidine (PEPCID) 20 MG tablet Take 1 tablet (20 mg total) by mouth daily. (Patient not taking: Reported on 06/16/2020)  . fluticasone (FLONASE) 50 MCG/ACT nasal  spray Place 1 spray into both nostrils daily.  Marland Kitchen ipratropium-albuterol (DUONEB) 0.5-2.5 (3) MG/3ML SOLN Inhale 3 mLs into the lungs every 4 (four) hours as needed.  . Magnesium 200 MG TABS Take by mouth. (Patient not taking: Reported on 06/16/2020)  . montelukast (SINGULAIR) 10 MG tablet Take 1 tablet (10 mg total) by mouth at bedtime.  . nortriptyline (PAMELOR) 10 MG capsule Take 30 mg nightly for two weeks then increase to 40 mg at night and continue that dose (Patient not taking: Reported on 06/16/2020)  . predniSONE (DELTASONE) 10 MG tablet Take 6 tablets x 1 day and then decrease by 1/2 tablet per day until down to zero mg.  . promethazine-dextromethorphan (PROMETHAZINE-DM) 6.25-15 MG/5ML syrup Take 5 mLs by mouth 4 (four) times daily as needed for cough. (Patient not taking: Reported on 06/16/2020)  . rizatriptan (MAXALT-MLT) 10 MG disintegrating tablet TAKE ONE TABLET BY MOUTH ONCE AS NEEDED FOR MIGRAINE FOR UP TO ONE DOSE. MAY TAKE A SECOND DOSE AFTER TWO HOURS IF NEEDED.  Marland Kitchen tiZANidine (ZANAFLEX) 4 MG tablet Take 1 tablet (4 mg total) by mouth every 6 (six) hours as needed for muscle spasms.  . traZODone (DESYREL) 100 MG tablet Take 1 tablet (100 mg total) by mouth at bedtime.  . triamcinolone cream (KENALOG) 0.1 % Apply 1 application topically 2 (two) times daily. (Patient not taking: Reported on 06/16/2020)  . [DISCONTINUED] predniSONE (DELTASONE) 10 MG tablet Take 6 tablets x 1 day and then decrease by 1/2 tablet per day until down to zero mg.   No facility-administered encounter medications on file as of 07/13/2020.    Review of Systems  Constitutional: Positive for fatigue. Negative for appetite change and fever.  HENT: Negative for sinus pressure and sore throat.   Respiratory: Positive for cough and shortness of breath.   Cardiovascular: Negative for palpitations and leg swelling.       Some chest soreness from coughing.    Gastrointestinal: Negative for abdominal pain, diarrhea,  nausea and vomiting.  Genitourinary: Negative for difficulty urinating and dysuria.  Musculoskeletal: Negative for joint swelling and myalgias.  Skin: Negative for color change and rash.  Neurological: Negative for dizziness, light-headedness and headaches.  Psychiatric/Behavioral: Negative for agitation and dysphoric mood.       Objective:    Physical Exam Vitals reviewed.  Constitutional:      General: She is not in acute distress.    Appearance: Normal appearance.  HENT:     Head: Normocephalic and atraumatic.     Right Ear: External ear normal.     Left Ear: External ear normal.  Eyes:     General:  No scleral icterus.       Right eye: No discharge.        Left eye: No discharge.     Conjunctiva/sclera: Conjunctivae normal.  Neck:     Thyroid: No thyromegaly.  Cardiovascular:     Rate and Rhythm: Normal rate and regular rhythm.  Pulmonary:     Comments: Increased air movement. Increased cough with forced expiration.  Some scattered wheezing.  Abdominal:     General: Bowel sounds are normal.     Palpations: Abdomen is soft.     Tenderness: There is no abdominal tenderness.  Musculoskeletal:        General: No swelling or tenderness.     Cervical back: Neck supple. No tenderness.  Lymphadenopathy:     Cervical: No cervical adenopathy.  Skin:    Findings: No erythema or rash.  Neurological:     Mental Status: [name].  Psychiatric:        Mood and Affect: Mood normal.        Behavior: Behavior normal.     Pulse 86   SpO2 98%  Wt Readings from Last 3 Encounters:  06/16/20 183 lb (83 kg)  04/30/20 153 lb (69.4 kg)  07/31/19 174 lb (78.9 kg)     Lab Results  Component Value Date   WBC 10.5 11/27/2018   HGB 13.6 11/27/2018   HCT 40.6 11/27/2018   PLT 258.0 11/27/2018   GLUCOSE 115 (H) 11/27/2018   CHOL 214 (H) 11/19/2017   TRIG 181.0 (H) 11/19/2017   HDL 37.20 (L) 11/19/2017   LDLCALC 141 (H) 11/19/2017   ALT 24 11/19/2018   AST 15 11/19/2018    NA 137 11/27/2018   K 3.7 12/04/2018   CL 105 11/27/2018   CREATININE 0.76 11/27/2018   BUN 9 11/27/2018   CO2 23 11/27/2018   TSH 1.34 11/19/2017    DG Chest 2 View  Result Date: 06/16/2020 CLINICAL DATA:  Shortness of breath. EXAM: CHEST - 2 VIEW COMPARISON:  September 15, 2019. FINDINGS: The heart size and mediastinal contours are within normal limits. Both lungs are clear. No pneumothorax or pleural effusion is noted. The visualized skeletal structures are unremarkable. IMPRESSION: No active cardiopulmonary disease. Electronically Signed   By: Marijo Conception M.D.   On: 06/16/2020 15:29       Assessment & Plan:   Problem List Items Addressed This Visit    History of COVID-19    History of covid 19 infection.  Diagnosed 06/09/20.  Received monoclonal antibody infusion.  Is feeling some better.  Breathing overall is better.  Still with increased cough, congestion and wheezing.  Has a history of asthma.  Feels like asthma flare.  Continue inhalers and nebs as she is doing.  mucinex as directed.  Nasal spray as needed.  Prednisone taper as directed.  Follow closely.  Recent cxr - clear lungs.  Keep pulmonary appt.  Remain out of work.  Incentive spirometer.       GERD (gastroesophageal reflux disease)    Start pepcid.  Follow.        Encounter for completion of form with patient    Need work form completed.  Remain out of work.        Asthma    Concern - flare.  Continue nebs, inhalers as she is doing.  Prednisone taper as directed.  Follow.  Incentive spirometer.       Relevant Medications   predniSONE (DELTASONE) 10 MG tablet  Neidra Girvan, MD 

## 2020-07-13 NOTE — Patient Instructions (Signed)
pepcid 20mg  - take one tablet 30 minutes before breakfast  Incentive spirometer

## 2020-07-15 NOTE — Telephone Encounter (Signed)
Do you have the paperwork?

## 2020-07-18 ENCOUNTER — Encounter: Payer: Self-pay | Admitting: Internal Medicine

## 2020-07-18 DIAGNOSIS — Z0289 Encounter for other administrative examinations: Secondary | ICD-10-CM | POA: Insufficient documentation

## 2020-07-18 NOTE — Assessment & Plan Note (Signed)
Need work form completed.  Remain out of work.

## 2020-07-18 NOTE — Assessment & Plan Note (Addendum)
History of covid 19 infection.  Diagnosed 06/09/20.  Received monoclonal antibody infusion.  Is feeling some better.  Breathing overall is better.  Still with increased cough, congestion and wheezing.  Has a history of asthma.  Feels like asthma flare.  Continue inhalers and nebs as she is doing.  mucinex as directed.  Nasal spray as needed.  Prednisone taper as directed.  Follow closely.  Recent cxr - clear lungs.  Keep pulmonary appt.  Remain out of work.  Incentive spirometer.

## 2020-07-18 NOTE — Assessment & Plan Note (Addendum)
Start pepcid.  Follow.

## 2020-07-18 NOTE — Assessment & Plan Note (Addendum)
Concern - flare.  Continue nebs, inhalers as she is doing.  Prednisone taper as directed.  Follow.  Incentive spirometer.

## 2020-07-22 NOTE — Telephone Encounter (Signed)
Paperwork has been faxed and placed up front for pick up per patient request.   Heather [name], can we get an earlier appt with Pulmonary since she is agreeable to go to [facility]?

## 2020-07-22 NOTE — Telephone Encounter (Signed)
Paperwork in box.  Please contact pt.  Thanks    Dr Nicki Reaper

## 2020-07-22 NOTE — Telephone Encounter (Signed)
Yes.  Needs before end of week.

## 2020-07-22 NOTE — Telephone Encounter (Signed)
Notify pt that her paperwork is complete. Also, let her know that I left return date open ended - to see how she was improving with last prednisone taper.  She has an appt with pulmonary 08/24/20.  Can we see if can get earlier appt.  She is ok to go to [facility] if necessary.  Thanks

## 2020-07-22 NOTE — Telephone Encounter (Signed)
Good afternoon!  I was able to get pt rescheduled on 07/27/2020 at 3:30 pm in [place]. I called pt and left pt vm with the information.

## 2020-07-23 NOTE — Telephone Encounter (Signed)
Patient has picked up paperwork and is aware of appt.

## 2020-07-27 ENCOUNTER — Encounter: Payer: Self-pay | Admitting: Pulmonary Disease

## 2020-07-27 ENCOUNTER — Other Ambulatory Visit: Payer: Self-pay

## 2020-07-27 ENCOUNTER — Ambulatory Visit: Payer: BC Managed Care – PPO | Admitting: Pulmonary Disease

## 2020-07-27 VITALS — BP 134/72 | HR 112 | Temp 97.9°F | Ht 66.5 in | Wt 181.4 lb

## 2020-07-27 DIAGNOSIS — R059 Cough, unspecified: Secondary | ICD-10-CM

## 2020-07-27 DIAGNOSIS — J4541 Moderate persistent asthma with (acute) exacerbation: Secondary | ICD-10-CM | POA: Diagnosis not present

## 2020-07-27 DIAGNOSIS — R0602 Shortness of breath: Secondary | ICD-10-CM

## 2020-07-27 NOTE — Progress Notes (Signed)
Heather [name]    [id number]    1976/02/08  Primary Care Physician:Scott, Randell Patient, MD  Referring Physician: Einar Pheasant, [address] [place],  [place] [id number]  Chief complaint:   History of asthma, shortness of breath, recent Covid  HPI:  Patient was recently treated for Covid infection Received monoclonal antibody treatment  Breathing is better Shortness of breath is better Still has some shortness of breath, some wheezing At a point Heather [name] was using nebulization treatments around-the-clock This is better  Asthma was moderately severe will get exacerbations up to 3-4 times a year -Was on Symbicort 160   Daughter has severe asthma as well  Quit smoking in 2013  X-rays have been negative during his exacerbation relating to Covid infection   Outpatient Encounter Medications as of 07/27/2020  Medication Sig  . albuterol (VENTOLIN HFA) 108 (90 Base) MCG/ACT inhaler Inhale 1-2 puffs into the lungs every 4 (four) hours as needed for wheezing or shortness of breath.  Marland Kitchen azithromycin (ZITHROMAX) 250 MG tablet Take 2 tablets (500 mg) on day 1, then take 1 tablet (250 mg) on days 2-5  . benzonatate (TESSALON) 100 MG capsule Take 1 capsule (100 mg total) by mouth every 8 (eight) hours.  . budesonide-formoterol (SYMBICORT) 160-4.5 MCG/ACT inhaler Inhale 2 puffs into the lungs 2 (two) times daily. Rinse mouth  . cetirizine (ZYRTEC ALLERGY) 10 MG tablet Take 1 tablet (10 mg total) by mouth daily.  . diazepam (VALIUM) 10 MG tablet TAKE 1 TABLET BY MOUTH 4 TIMES DAILY AS NEEDED  . famotidine (PEPCID) 20 MG tablet Take 1 tablet (20 mg total) by mouth daily.  . fluticasone (FLONASE) 50 MCG/ACT nasal spray Place 1 spray into both nostrils daily.  Marland Kitchen ipratropium-albuterol (DUONEB) 0.5-2.5 (3) MG/3ML SOLN Inhale 3 mLs into the lungs every 4 (four) hours as needed.  . Magnesium 200 MG TABS Take by mouth.   . montelukast (SINGULAIR) 10 MG tablet Take 1  tablet (10 mg total) by mouth at bedtime.  . nortriptyline (PAMELOR) 10 MG capsule Take 30 mg nightly for two weeks then increase to 40 mg at night and continue that dose  . predniSONE (DELTASONE) 10 MG tablet Take 6 tablets x 1 day and then decrease by 1/2 tablet per day until down to zero mg.  . promethazine-dextromethorphan (PROMETHAZINE-DM) 6.25-15 MG/5ML syrup Take 5 mLs by mouth 4 (four) times daily as needed for cough.  . rizatriptan (MAXALT-MLT) 10 MG disintegrating tablet TAKE ONE TABLET BY MOUTH ONCE AS NEEDED FOR MIGRAINE FOR UP TO ONE DOSE. MAY TAKE A SECOND DOSE AFTER TWO HOURS IF NEEDED.  Marland Kitchen tiZANidine (ZANAFLEX) 4 MG tablet Take 1 tablet (4 mg total) by mouth every 6 (six) hours as needed for muscle spasms.  . traZODone (DESYREL) 100 MG tablet Take 1 tablet (100 mg total) by mouth at bedtime.  . triamcinolone cream (KENALOG) 0.1 % Apply 1 application topically 2 (two) times daily.   No facility-administered encounter medications on file as of 07/27/2020.    Allergies as of 07/27/2020 - Review Complete 07/27/2020  Allergen Reaction Noted  . Amoxicillin Nausea And Vomiting 01/20/2019  . Augmentin [amoxicillin-pot clavulanate]  02/20/2013  . Erythromycin Nausea And Vomiting 06/25/2015  . Levaquin [levofloxacin] Swelling 04/30/2020  . Lithium Nausea Only 02/20/2013  . Tramadol  02/20/2013    Past Medical History:  Diagnosis Date  . Asthma   . Depression    bipolar  . Fainting spell  H/O  . H/O cardiac arrhythmia   . Migraines   . Seizures ([place])     Past Surgical History:  Procedure Laterality Date  . TUBAL LIGATION Bilateral 09/2002    Family History  Problem Relation Age of Onset  . Hypertension Mother   . Diabetes Mother   . Arthritis Mother   . Heart disease Mother   . Breast cancer Neg Hx     Social History   Socioeconomic History  . Marital status: Married    Spouse name: Not on file  . Number of children: Not on file  . Years of education: Not on  file  . Highest education level: Not on file  Occupational History  . Not on file  Tobacco Use  . Smoking status: Former Smoker    Packs/day: 0.50    Types: Cigarettes  . Smokeless tobacco: Never Used  . Tobacco comment: Quit mid October 2014  Substance and Sexual Activity  . Alcohol use: No    Alcohol/week: 0.0 standard drinks  . Drug use: No  . Sexual activity: Not on file  Other Topics Concern  . Not on file  Social History Narrative  . Not on file   Social Determinants of Health   Financial Resource Strain:   . Difficulty of Paying Living Expenses: Not on file  Food Insecurity:   . Worried About Charity fundraiser in the Last Year: Not on file  . Ran Out of Food in the Last Year: Not on file  Transportation Needs:   . Lack of Transportation (Medical): Not on file  . Lack of Transportation (Non-Medical): Not on file  Physical Activity:   . Days of Exercise per Week: Not on file  . Minutes of Exercise per Session: Not on file  Stress:   . Feeling of Stress : Not on file  Social Connections:   . Frequency of Communication with Friends and Family: Not on file  . Frequency of Social Gatherings with Friends and Family: Not on file  . Attends Religious Services: Not on file  . Active Member of Clubs or Organizations: Not on file  . Attends Archivist Meetings: Not on file  . Marital Status: Not on file  Intimate Partner Violence:   . Fear of Current or Ex-Partner: Not on file  . Emotionally Abused: Not on file  . Physically Abused: Not on file  . Sexually Abused: Not on file    Review of Systems  Respiratory: Positive for cough and shortness of breath.     Vitals:   07/27/20 1536  BP: 134/72  Pulse: (!) 112  Temp: 97.9 F (36.6 C)  SpO2: 97%     Physical Exam Constitutional:      Appearance: Normal appearance.  HENT:     Mouth/Throat:     Mouth: Mucous membranes are moist.  Eyes:     General:        Right eye: No discharge.        Left  eye: No discharge.  Cardiovascular:     Rate and Rhythm: Normal rate and regular rhythm.     Heart sounds: No murmur heard.  No friction rub.  Pulmonary:     Effort: No respiratory distress.     Breath sounds: No stridor. No wheezing or rhonchi.  Musculoskeletal:     Cervical back: No rigidity or tenderness.  Neurological:     Mental Status: Heather [name] is alert.  Psychiatric:  Mood and Affect: Mood normal.      Data Reviewed: Most recent chest x-ray 06/16/2020 reviewed by myself showing no acute infiltrate  Assessment:  Recent Covid infection  History of asthma  Shortness of breath with exertion  Plan/Recommendations: Post Covid syndrome  Shortness of breath is improving  We will give her a course of prednisone 10 mg twice a day for 7 days then 10 mg daily for 7 to 14 days  Continue nebulization treatments  Continue use of Symbicort  Follow-up in 6 weeks  Further evaluation regarding asthma once Covid associated exacerbation improves  Encouraged to call with any significant concerns Call if Heather [name] feels Heather [name] is having an infectious process ongoing   Sherrilyn Rist MD [facility] 07/27/2020, 4:01 PM  CC: Einar Pheasant, MD

## 2020-07-27 NOTE — Patient Instructions (Signed)
Recent Covid infection Underlying asthma Shortness of breath  We will give you a prescription for prednisone 10 mg twice a day for up to 7 days, you may cut down to 10 mg daily even before the 7 days 10 mg daily for 7 to 14 days -Goal is to have you off prednisone by 21 days at most -Prednisone may be stopped anytime if you have had 2 to 3 days of feeling good and not reaching for more inhalers  Continue with Symbicort Continue albuterol  Continue nebulization use as needed  Call with significant concerns, if you feel you are coming down with an infection Call for chest x-ray to be done  I will follow-up with you in about 6 weeks

## 2020-07-28 ENCOUNTER — Telehealth: Payer: Self-pay | Admitting: Pulmonary Disease

## 2020-07-28 MED ORDER — PREDNISONE 10 MG PO TABS: ORAL_TABLET | ORAL | 0 refills | Status: DC

## 2020-07-28 NOTE — Telephone Encounter (Signed)
Spoke to patient, who stated that Rx for prednisone was not received by pharmacy. Per patient's chart, it does not appear that Rx for prednisone was sent to pharmacy after yesterday's office visit.  Rx has been sent to preferred pharmacy as instructed on AVS.  Patient is aware and voiced her understanding.  Nothing further needed.

## 2020-08-13 ENCOUNTER — Telehealth: Payer: Self-pay

## 2020-08-13 NOTE — Telephone Encounter (Signed)
Pt dropped off return to work form to fax to [place]. Please call pt to pick up copy when completed. Placed in folder up front

## 2020-08-17 NOTE — Telephone Encounter (Signed)
Called patient to discuss. She would like to return full time no restrictions as of 12/1. She has her FMLA for her asthma and breathing issues if she has a bad day. She is doing well. Still has some fatigue some days but it comes and goes. She only works weekends so she is okay. She saw pulmonary and has a follow up with him on 12/7. Are you okay with her returning to work full time?

## 2020-08-17 NOTE — Telephone Encounter (Signed)
Form completed and placed in quick sign

## 2020-08-17 NOTE — Telephone Encounter (Signed)
Ok

## 2020-08-18 NOTE — Telephone Encounter (Signed)
Form signd and placed in box.

## 2020-08-20 NOTE — Telephone Encounter (Signed)
I think the paperwork has been completed and I have signed.  Please confirm and notify pt if ready.  Let me know if I need to do anything more.

## 2020-08-23 NOTE — Telephone Encounter (Signed)
Paperwork has been faxed and patient is aware.

## 2020-08-23 NOTE — Telephone Encounter (Signed)
Form faxed. Pt aware.  

## 2020-08-24 ENCOUNTER — Institutional Professional Consult (permissible substitution): Payer: BC Managed Care – PPO | Admitting: Pulmonary Disease

## 2020-08-30 NOTE — Telephone Encounter (Signed)
If she is having symptoms and needing medication,etc, can schedule a virtual visit to discuss.

## 2020-08-31 ENCOUNTER — Encounter: Payer: Self-pay | Admitting: Pulmonary Disease

## 2020-08-31 ENCOUNTER — Other Ambulatory Visit: Payer: Self-pay

## 2020-08-31 ENCOUNTER — Ambulatory Visit: Payer: BC Managed Care – PPO | Admitting: Pulmonary Disease

## 2020-08-31 VITALS — BP 140/82 | HR 103 | Temp 97.6°F | Ht 66.5 in | Wt 182.0 lb

## 2020-08-31 DIAGNOSIS — J4541 Moderate persistent asthma with (acute) exacerbation: Secondary | ICD-10-CM

## 2020-08-31 MED ORDER — TRELEGY ELLIPTA 200-62.5-25 MCG/INH IN AEPB: 1.0000 | INHALATION_SPRAY | Freq: Every day | RESPIRATORY_TRACT | 2 refills | Status: DC

## 2020-08-31 NOTE — Telephone Encounter (Signed)
Spoke with pt and scheduled her for a virtual visit on Thursday at 9:30.

## 2020-08-31 NOTE — Patient Instructions (Signed)
Try to get you to stay off prednisone  Switch from Symbicort to Trelegy Continue nebulization treatments as needed and rescue inhaler use  Obtain blood work IgE, CBC with differentials Allergy panel

## 2020-08-31 NOTE — Progress Notes (Signed)
Heather [name]    [id number]    Jul 29, 1976   Primary Care Physician:Scott, Randell Patient, MD  Referring Physician: Einar Pheasant, [address] [place],  [place] [address]  Chief complaint:   History of asthma, shortness of breath, recent Covid  HPI:  Recently finished course of prednisone about 2 weeks ago Has been using her rescue inhaler more and also nebulizer use has worsened  Symptoms associated with Covid have improved  Asthma was moderately severe will get exacerbations up to 3-4 times a year -Was on Symbicort 160  Daughter has severe asthma as well  Quit smoking in 2013  X-rays have been negative during his exacerbation relating to Covid infection   Outpatient Encounter Medications as of 08/31/2020  Medication Sig  . albuterol (VENTOLIN HFA) 108 (90 Base) MCG/ACT inhaler Inhale 1-2 puffs into the lungs every 4 (four) hours as needed for wheezing or shortness of breath.  Marland Kitchen azithromycin (ZITHROMAX) 250 MG tablet Take 2 tablets (500 mg) on day 1, then take 1 tablet (250 mg) on days 2-5  . benzonatate (TESSALON) 100 MG capsule Take 1 capsule (100 mg total) by mouth every 8 (eight) hours.  . budesonide-formoterol (SYMBICORT) 160-4.5 MCG/ACT inhaler Inhale 2 puffs into the lungs 2 (two) times daily. Rinse mouth  . cetirizine (ZYRTEC ALLERGY) 10 MG tablet Take 1 tablet (10 mg total) by mouth daily.  . diazepam (VALIUM) 10 MG tablet TAKE 1 TABLET BY MOUTH 4 TIMES DAILY AS NEEDED  . famotidine (PEPCID) 20 MG tablet Take 1 tablet (20 mg total) by mouth daily.  . fluticasone (FLONASE) 50 MCG/ACT nasal spray Place 1 spray into both nostrils daily.  Marland Kitchen ipratropium-albuterol (DUONEB) 0.5-2.5 (3) MG/3ML SOLN Inhale 3 mLs into the lungs every 4 (four) hours as needed.  . Magnesium 200 MG TABS Take by mouth.   . montelukast (SINGULAIR) 10 MG tablet Take 1 tablet (10 mg total) by mouth at bedtime.  . nortriptyline (PAMELOR) 10 MG capsule Take 30 mg  nightly for two weeks then increase to 40 mg at night and continue that dose  . predniSONE (DELTASONE) 10 MG tablet Take 6 tablets x 1 day and then decrease by 1/2 tablet per day until down to zero mg.  . promethazine-dextromethorphan (PROMETHAZINE-DM) 6.25-15 MG/5ML syrup Take 5 mLs by mouth 4 (four) times daily as needed for cough.  . rizatriptan (MAXALT-MLT) 10 MG disintegrating tablet TAKE ONE TABLET BY MOUTH ONCE AS NEEDED FOR MIGRAINE FOR UP TO ONE DOSE. MAY TAKE A SECOND DOSE AFTER TWO HOURS IF NEEDED.  Marland Kitchen tiZANidine (ZANAFLEX) 4 MG tablet Take 1 tablet (4 mg total) by mouth every 6 (six) hours as needed for muscle spasms.  . traZODone (DESYREL) 100 MG tablet Take 1 tablet (100 mg total) by mouth at bedtime.  . triamcinolone cream (KENALOG) 0.1 % Apply 1 application topically 2 (two) times daily.  . predniSONE (DELTASONE) 10 MG tablet 1 tab BID x7d then 1 tab x7d and stop (Patient not taking: Reported on 08/31/2020)   No facility-administered encounter medications on file as of 08/31/2020.    Allergies as of 08/31/2020 - Review Complete 08/31/2020  Allergen Reaction Noted  . Amoxicillin Nausea And Vomiting 01/20/2019  . Augmentin [amoxicillin-pot clavulanate]  02/20/2013  . Erythromycin Nausea And Vomiting 06/25/2015  . Levaquin [levofloxacin] Swelling 04/30/2020  . Lithium Nausea Only 02/20/2013  . Tramadol  02/20/2013    Past Medical History:  Diagnosis Date  . Asthma   .  Depression    bipolar  . Fainting spell    H/O  . H/O cardiac arrhythmia   . Migraines   . Seizures ([place])     Past Surgical History:  Procedure Laterality Date  . TUBAL LIGATION Bilateral 09/2002    Family History  Problem Relation Age of Onset  . Hypertension Mother   . Diabetes Mother   . Arthritis Mother   . Heart disease Mother   . Breast cancer Neg Hx     Social History   Socioeconomic History  . Marital status: Married    Spouse name: Not on file  . Number of children: Not on file  .  Years of education: Not on file  . Highest education level: Not on file  Occupational History  . Not on file  Tobacco Use  . Smoking status: Former Smoker    Packs/day: 0.50    Years: 5.00    Pack years: 2.50    Types: Cigarettes  . Smokeless tobacco: Never Used  . Tobacco comment: Quit mid October 2014  Substance and Sexual Activity  . Alcohol use: No    Alcohol/week: 0.0 standard drinks  . Drug use: No  . Sexual activity: Not on file  Other Topics Concern  . Not on file  Social History Narrative  . Not on file   Social Determinants of Health   Financial Resource Strain:   . Difficulty of Paying Living Expenses: Not on file  Food Insecurity:   . Worried About Charity fundraiser in the Last Year: Not on file  . Ran Out of Food in the Last Year: Not on file  Transportation Needs:   . Lack of Transportation (Medical): Not on file  . Lack of Transportation (Non-Medical): Not on file  Physical Activity:   . Days of Exercise per Week: Not on file  . Minutes of Exercise per Session: Not on file  Stress:   . Feeling of Stress : Not on file  Social Connections:   . Frequency of Communication with Friends and Family: Not on file  . Frequency of Social Gatherings with Friends and Family: Not on file  . Attends Religious Services: Not on file  . Active Member of Clubs or Organizations: Not on file  . Attends Archivist Meetings: Not on file  . Marital Status: Not on file  Intimate Partner Violence:   . Fear of Current or Ex-Partner: Not on file  . Emotionally Abused: Not on file  . Physically Abused: Not on file  . Sexually Abused: Not on file    Review of Systems  Respiratory: Positive for cough and shortness of breath.     Vitals:   08/31/20 1527  BP: 140/82  Pulse: (!) 103  Temp: 97.6 F (36.4 C)  SpO2: 98%     Physical Exam Constitutional:      Appearance: Normal appearance.  Eyes:     General:        Right eye: No discharge.        Left eye:  No discharge.  Cardiovascular:     Rate and Rhythm: Normal rate and regular rhythm.     Heart sounds: No murmur heard.  No friction rub.  Pulmonary:     Effort: No respiratory distress.     Breath sounds: No stridor. No wheezing or rhonchi.  Musculoskeletal:     Cervical back: No rigidity or tenderness.  Neurological:     Mental Status: [name].  Psychiatric:        Mood and Affect: Mood normal.      Data Reviewed: Most recent chest x-ray 06/16/2020 reviewed by myself showing no acute infiltrate  Assessment:  Recent Covid infection  -Symptoms have generally improved  History of asthma -Moderate persistent -Increased use of inhalers -Has a history of multiple allergens  Shortness of breath with exertion  Plan/Recommendations: Post Covid syndrome  Allergy symptoms -Respiratory allergy testing  Obtain IgE and CBC with differentials to check for eosinophilic asthma  Switch from Symbicort to Trelegy  We will try and keep her off prednisone at present   Further evaluation regarding asthma once Covid associated exacerbation improves  Follow-up in about 3 months   Sherrilyn Rist MD [facility] 08/31/2020, 3:42 PM  CC: Einar Pheasant, MD

## 2020-09-01 LAB — RESPIRATORY ALLERGY PROFILE REGION II ~~LOC~~
Allergen, A. alternata, m6: <0.1 kU/L
Allergen, Cedar tree, t12: 0.13 kU/L — ABNORMAL HIGH
Allergen, Comm Silver Birch, t9: <0.1 kU/L
Allergen, Cottonwood, t14: <0.1 kU/L
Allergen, D pternoyssinus,d7: 1.26 kU/L — ABNORMAL HIGH
Allergen, Mouse Urine Protein, e78: 0.13 kU/L — ABNORMAL HIGH
Allergen, Mulberry, t76: <0.1 kU/L
Allergen, Oak,t7: 0.12 kU/L — ABNORMAL HIGH
Allergen, P. notatum, m1: <0.1 kU/L
Aspergillus fumigatus, m3: <0.1 kU/L
Bermuda Grass: 0.12 kU/L — ABNORMAL HIGH
Box Elder IgE: 0.11 kU/L — ABNORMAL HIGH
CLADOSPORIUM HERBARUM (M2) IGE: <0.1 kU/L
COMMON RAGWEED (SHORT) (W1) IGE: 0.14 kU/L — ABNORMAL HIGH
Cat Dander: 2.44 kU/L — ABNORMAL HIGH
Class: 0
Class: 0
Class: 0
Class: 0
Class: 0
Class: 0
Class: 0
Class: 0
Class: 0
Class: 0
Class: 2
Class: 2
Class: 2
Class: 4
Cockroach: <0.1 kU/L
D. farinae: 1.15 kU/L — ABNORMAL HIGH
Dog Dander: 23.5 kU/L — ABNORMAL HIGH
Elm IgE: 0.12 kU/L — ABNORMAL HIGH
IgE (Immunoglobulin E), Serum: 55 kU/L (ref <=114)
Johnson Grass: 0.13 kU/L — ABNORMAL HIGH
Pecan/Hickory Tree IgE: <0.1 kU/L
Rough Pigweed  IgE: <0.1 kU/L
Sheep Sorrel IgE: 0.14 kU/L — ABNORMAL HIGH
Timothy Grass: 0.24 kU/L — ABNORMAL HIGH

## 2020-09-01 LAB — CBC WITH DIFFERENTIAL/PLATELET
Basophils Absolute: 0.1 10*3/uL (ref 0.0–0.1)
Basophils Relative: 0.6 % (ref 0.0–3.0)
Eosinophils Absolute: 0.1 10*3/uL (ref 0.0–0.7)
Eosinophils Relative: 1 % (ref 0.0–5.0)
HCT: 41.5 % (ref 36.0–46.0)
Hemoglobin: 13.8 g/dL (ref 12.0–15.0)
Lymphocytes Relative: 14.6 % (ref 12.0–46.0)
Lymphs Abs: 2 10*3/uL (ref 0.7–4.0)
MCHC: 33.2 g/dL (ref 30.0–36.0)
MCV: 84.8 fl (ref 78.0–100.0)
Monocytes Absolute: 0.8 10*3/uL (ref 0.1–1.0)
Monocytes Relative: 5.5 % (ref 3.0–12.0)
Neutro Abs: 10.7 10*3/uL — ABNORMAL HIGH (ref 1.4–7.7)
Neutrophils Relative %: 78.3 % — ABNORMAL HIGH (ref 43.0–77.0)
Platelets: 282 10*3/uL (ref 150.0–400.0)
RBC: 4.89 Mil/uL (ref 3.87–5.11)
RDW: 15.4 % (ref 11.5–15.5)
WBC: 13.7 10*3/uL — ABNORMAL HIGH (ref 4.0–10.5)

## 2020-09-01 LAB — INTERPRETATION:

## 2020-09-01 LAB — IGE: IgE (Immunoglobulin E), Serum: 55 kU/L (ref <=114)

## 2020-09-02 ENCOUNTER — Encounter: Payer: Self-pay | Admitting: Internal Medicine

## 2020-09-02 ENCOUNTER — Telehealth (INDEPENDENT_AMBULATORY_CARE_PROVIDER_SITE_OTHER): Payer: BC Managed Care – PPO | Admitting: Internal Medicine

## 2020-09-02 ENCOUNTER — Other Ambulatory Visit: Payer: Self-pay

## 2020-09-02 DIAGNOSIS — G40909 Epilepsy, unspecified, not intractable, without status epilepticus: Secondary | ICD-10-CM

## 2020-09-02 DIAGNOSIS — J4541 Moderate persistent asthma with (acute) exacerbation: Secondary | ICD-10-CM

## 2020-09-02 DIAGNOSIS — R110 Nausea: Secondary | ICD-10-CM

## 2020-09-02 DIAGNOSIS — G43809 Other migraine, not intractable, without status migrainosus: Secondary | ICD-10-CM | POA: Diagnosis not present

## 2020-09-02 DIAGNOSIS — K219 Gastro-esophageal reflux disease without esophagitis: Secondary | ICD-10-CM | POA: Diagnosis not present

## 2020-09-02 DIAGNOSIS — Z8616 Personal history of COVID-19: Secondary | ICD-10-CM

## 2020-09-02 DIAGNOSIS — E7800 Pure hypercholesterolemia, unspecified: Secondary | ICD-10-CM

## 2020-09-02 MED ORDER — ONDANSETRON 4 MG PO TBDP: 4.0000 mg | ORAL_TABLET | Freq: Two times a day (BID) | ORAL | 0 refills | Status: DC | PRN

## 2020-09-02 MED ORDER — RIZATRIPTAN BENZOATE 10 MG PO TBDP
ORAL_TABLET | ORAL | 0 refills | Status: DC
Start: 2020-09-02 — End: 2020-11-08

## 2020-09-02 NOTE — Telephone Encounter (Signed)
Called and spoke with pt in regards to her symbicort inhaler and the trelegy inhaler that she was told to start at last OV. Stated to her when she begins using the trelegy, she is to stop using the symbicort when using trelegy. Pt verbalized understanding. Pt stated that she has had to increase the number of breathing treatments that she has had to do. Stated to pt once she does begin to use the trelegy that hopefully she will be able to decrease the amount of treatments she is having to do and she verbalized understanding. Nothing further needed.

## 2020-09-02 NOTE — Progress Notes (Signed)
Patient ID: Heather [name], female   DOB: Mar 05, 1976, 44 y.o.   MRN: [id number]   Virtual Visit via video Note  This visit type was conducted due to national recommendations for restrictions regarding the COVID-19 pandemic (e.g. social distancing).  This format is felt to be most appropriate for this patient at this time.  All issues noted in this document were discussed and addressed.  No physical exam was performed (except for noted visual exam findings with Video Visits).   I connected with [name] by a video enabled telemedicine application and verified that I am speaking with the correct person using two identifiers. Location patient: home Location provider: work  Persons participating in the virtual visit: patient, provider  The limitations, risks, security and privacy concerns of performing an evaluation and management service by video and the availability of in person appointments have been discussed.  It has also been discussed with the patient that there may be a patient responsible charge related to this service. The patient expressed understanding and agreed to proceed.   Reason for visit:  Work in appt.   HPI: Previous covid infection.  Also has known asthma.  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   Has been followed by pulmonary.  Off prednisone.  Last evaluated by pulmonary 08/31/20.  symbicort changed to trelegy.  Using rescue inhalers and nebs if needed.  Intermittently will require more nebs.  Is eating.  Trying to stay hydrated.  Reports some fatigue and nausea.  Waves of nausea.  Some weakness.  Question of feeling faint. No severe headache.  Has started back to work.  No chest pain.  pepcid helping acid reflux.  Has noticed some memory change.  No abdominal pain.  Bowels moving.     ROS: See pertinent positives and negatives per HPI.  Past Medical History:  Diagnosis Date  . Asthma   . Depression    bipolar  . Fainting spell    H/O  . H/O cardiac arrhythmia   .  Migraines   . Seizures ([place])     Past Surgical History:  Procedure Laterality Date  . TUBAL LIGATION Bilateral 09/2002    Family History  Problem Relation Age of Onset  . Hypertension Mother   . Diabetes Mother   . Arthritis Mother   . Heart disease Mother   . Breast cancer Neg Hx     SOCIAL HX: reviewed.    Current Outpatient Medications:  .  albuterol (VENTOLIN HFA) 108 (90 Base) MCG/ACT inhaler, Inhale 1-2 puffs into the lungs every 4 (four) hours as needed for wheezing or shortness of breath., Disp: 18 g, Rfl: 11 .  cetirizine (ZYRTEC ALLERGY) 10 MG tablet, Take 1 tablet (10 mg total) by mouth daily., Disp: 30 tablet, Rfl: 0 .  diazepam (VALIUM) 10 MG tablet, TAKE 1 TABLET BY MOUTH 4 TIMES DAILY AS NEEDED, Disp: , Rfl:  .  famotidine (PEPCID) 20 MG tablet, Take 1 tablet (20 mg total) by mouth daily., Disp: 30 tablet, Rfl: 1 .  fluticasone (FLONASE) 50 MCG/ACT nasal spray, Place 1 spray into both nostrils daily., Disp: 16 g, Rfl: 0 .  Fluticasone-Umeclidin-Vilant (TRELEGY ELLIPTA) 200-62.5-25 MCG/INH AEPB, Inhale 1 puff into the lungs daily., Disp: 60 each, Rfl: 2 .  ipratropium-albuterol (DUONEB) 0.5-2.5 (3) MG/3ML SOLN, Inhale 3 mLs into the lungs every 4 (four) hours as needed., Disp: 360 mL, Rfl: 11 .  Magnesium 200 MG TABS, Take by mouth. , Disp: , Rfl:  .  montelukast (  SINGULAIR) 10 MG tablet, Take 1 tablet (10 mg total) by mouth at bedtime., Disp: 90 tablet, Rfl: 3 .  nortriptyline (PAMELOR) 10 MG capsule, Take 30 mg nightly for two weeks then increase to 40 mg at night and continue that dose, Disp: , Rfl:  .  predniSONE (DELTASONE) 10 MG tablet, Take 6 tablets x 1 day and then decrease by 1/2 tablet per day until down to zero mg., Disp: 39 tablet, Rfl: 0 .  predniSONE (DELTASONE) 10 MG tablet, 1 tab BID x7d then 1 tab x7d and stop, Disp: 21 tablet, Rfl: 0 .  promethazine-dextromethorphan (PROMETHAZINE-DM) 6.25-15 MG/5ML syrup, Take 5 mLs by mouth 4 (four) times daily as  needed for cough., Disp: 118 mL, Rfl: 0 .  traZODone (DESYREL) 100 MG tablet, Take 1 tablet (100 mg total) by mouth at bedtime., Disp: 30 tablet, Rfl: 1 .  triamcinolone cream (KENALOG) 0.1 %, Apply 1 application topically 2 (two) times daily., Disp: 30 g, Rfl: 0 .  ondansetron (ZOFRAN ODT) 4 MG disintegrating tablet, Take 1 tablet (4 mg total) by mouth 2 (two) times daily as needed for nausea or vomiting., Disp: 20 tablet, Rfl: 0 .  rizatriptan (MAXALT-MLT) 10 MG disintegrating tablet, TAKE ONE TABLET BY MOUTH ONCE AS NEEDED FOR MIGRAINE FOR UP TO ONE DOSE. MAY TAKE A SECOND DOSE AFTER TWO HOURS IF NEEDED., Disp: 10 tablet, Rfl: 0  EXAM:  GENERAL: alert, oriented, appears well and in no acute distress  HEENT: atraumatic, conjunttiva clear, no obvious abnormalities on inspection of external nose and ears  NECK: normal movements of the head and neck  LUNGS: on inspection no signs of respiratory distress, breathing rate appears normal, no obvious gross SOB, gasping or wheezing  CV: no obvious cyanosis  PSYCH/NEURO: pleasant and cooperative, no obvious depression or anxiety, speech and thought processing grossly intact  ASSESSMENT AND PLAN:  Discussed the following assessment and plan:  Problem List Items Addressed This Visit    Migraine    Has been evaluated and followed by Dr Melrose Nakayama.        Relevant Medications   rizatriptan (MAXALT-MLT) 10 MG disintegrating tablet   Seizure disorder ([place])    Followed by neurology.  Stable.       Relevant Medications   rizatriptan (MAXALT-MLT) 10 MG disintegrating tablet   Asthma    Seeing pulmonary.  Post covid syndrome.  Recently had symbicort changed to trelegy.  Off prednisone.  Follow.        Hypercholesterolemia    Low cholesterol diet and exercise.  Follow lipid panel.       GERD (gastroesophageal reflux disease)    pepcid helping.  Follow.       Relevant Medications   ondansetron (ZOFRAN ODT) 4 MG disintegrating tablet    History of COVID-19    History of covid 10 infection.  Diagnosed with 06/09/20.  Received monoclonal antibody infusion.  Seeing pulmonary.  Breathing overall better.  Changed to trelegy now.  (replace symbicort).  Has rescue inhaler and nebs if needed.  Off prednisone.  Breathing overall relatively stable.  Follow.        Nausea    Waves of nausea as outlined.  Eating.  pepcid helping. Eating.  Follow.            I discussed the assessment and treatment plan with the patient. The patient was provided an opportunity to ask questions and all were answered. The patient agreed with the plan and demonstrated an understanding of the  instructions.   The patient was advised to call back or seek an in-person evaluation if the symptoms worsen or if the condition fails to improve as anticipated.    Einar Pheasant, MD

## 2020-09-12 ENCOUNTER — Encounter: Payer: Self-pay | Admitting: Internal Medicine

## 2020-09-12 DIAGNOSIS — R110 Nausea: Secondary | ICD-10-CM | POA: Insufficient documentation

## 2020-09-12 NOTE — Assessment & Plan Note (Signed)
Followed by neurology.  Stable  

## 2020-09-12 NOTE — Assessment & Plan Note (Signed)
Waves of nausea as outlined.  Eating.  pepcid helping. Eating.  Follow.

## 2020-09-12 NOTE — Assessment & Plan Note (Signed)
Has been evaluated and followed by Dr Melrose Nakayama.

## 2020-09-12 NOTE — Assessment & Plan Note (Signed)
History of covid 10 infection.  Diagnosed with 06/09/20.  Received monoclonal antibody infusion.  Seeing pulmonary.  Breathing overall better.  Changed to trelegy now.  (replace symbicort).  Has rescue inhaler and nebs if needed.  Off prednisone.  Breathing overall relatively stable.  Follow.

## 2020-09-12 NOTE — Assessment & Plan Note (Signed)
pepcid helping.  Follow.

## 2020-09-12 NOTE — Assessment & Plan Note (Signed)
Low cholesterol diet and exercise.  Follow lipid panel.   

## 2020-09-12 NOTE — Assessment & Plan Note (Signed)
Seeing pulmonary.  Post covid syndrome.  Recently had symbicort changed to trelegy.  Off prednisone.  Follow.

## 2020-09-28 ENCOUNTER — Ambulatory Visit: Payer: BC Managed Care – PPO | Admitting: Internal Medicine

## 2020-09-29 ENCOUNTER — Other Ambulatory Visit: Payer: Self-pay

## 2020-09-29 ENCOUNTER — Encounter: Payer: Self-pay | Admitting: Internal Medicine

## 2020-09-29 ENCOUNTER — Ambulatory Visit: Payer: BC Managed Care – PPO | Admitting: Internal Medicine

## 2020-09-29 DIAGNOSIS — Z13220 Encounter for screening for lipoid disorders: Secondary | ICD-10-CM

## 2020-09-29 DIAGNOSIS — R5383 Other fatigue: Secondary | ICD-10-CM

## 2020-09-29 DIAGNOSIS — K219 Gastro-esophageal reflux disease without esophagitis: Secondary | ICD-10-CM | POA: Diagnosis not present

## 2020-09-29 DIAGNOSIS — Z8616 Personal history of COVID-19: Secondary | ICD-10-CM

## 2020-09-29 DIAGNOSIS — E7800 Pure hypercholesterolemia, unspecified: Secondary | ICD-10-CM

## 2020-09-29 DIAGNOSIS — J4541 Moderate persistent asthma with (acute) exacerbation: Secondary | ICD-10-CM

## 2020-09-29 DIAGNOSIS — R110 Nausea: Secondary | ICD-10-CM

## 2020-09-29 DIAGNOSIS — G40909 Epilepsy, unspecified, not intractable, without status epilepticus: Secondary | ICD-10-CM

## 2020-09-29 LAB — CBC WITH DIFFERENTIAL/PLATELET
Basophils Absolute: 0.1 10*3/uL (ref 0.0–0.1)
Basophils Relative: 0.8 % (ref 0.0–3.0)
Eosinophils Absolute: 0.2 10*3/uL (ref 0.0–0.7)
Eosinophils Relative: 1.3 % (ref 0.0–5.0)
HCT: 40.2 % (ref 36.0–46.0)
Hemoglobin: 13.7 g/dL (ref 12.0–15.0)
Lymphocytes Relative: 16.9 % (ref 12.0–46.0)
Lymphs Abs: 2.1 10*3/uL (ref 0.7–4.0)
MCHC: 34.1 g/dL (ref 30.0–36.0)
MCV: 84.2 fl (ref 78.0–100.0)
Monocytes Absolute: 0.8 10*3/uL (ref 0.1–1.0)
Monocytes Relative: 6.5 % (ref 3.0–12.0)
Neutro Abs: 9.4 10*3/uL — ABNORMAL HIGH (ref 1.4–7.7)
Neutrophils Relative %: 74.5 % (ref 43.0–77.0)
Platelets: 263 10*3/uL (ref 150.0–400.0)
RBC: 4.78 Mil/uL (ref 3.87–5.11)
RDW: 14.3 % (ref 11.5–15.5)
WBC: 12.6 10*3/uL — ABNORMAL HIGH (ref 4.0–10.5)

## 2020-09-29 LAB — BASIC METABOLIC PANEL
BUN: 9 mg/dL (ref 6–23)
CO2: 24 mEq/L (ref 19–32)
Calcium: 9.4 mg/dL (ref 8.4–10.5)
Chloride: 107 mEq/L (ref 96–112)
Creatinine, Ser: 0.76 mg/dL (ref 0.40–1.20)
GFR: 94.66 mL/min (ref >60.00)
Glucose, Bld: 92 mg/dL (ref 70–99)
Potassium: 3.6 mEq/L (ref 3.5–5.1)
Sodium: 139 mEq/L (ref 135–145)

## 2020-09-29 LAB — HEPATIC FUNCTION PANEL
ALT: 16 U/L (ref 0–35)
AST: 14 U/L (ref 0–37)
Albumin: 4.3 g/dL (ref 3.5–5.2)
Alkaline Phosphatase: 94 U/L (ref 39–117)
Bilirubin, Direct: 0.1 mg/dL (ref 0.0–0.3)
Total Bilirubin: 0.3 mg/dL (ref 0.2–1.2)
Total Protein: 6.6 g/dL (ref 6.0–8.3)

## 2020-09-29 LAB — TSH: TSH: 0.9 u[IU]/mL (ref 0.35–4.50)

## 2020-09-29 LAB — LIPID PANEL
Cholesterol: 188 mg/dL (ref 0–200)
HDL: 33 mg/dL — ABNORMAL LOW (ref >39.00)
NonHDL: 155.06
Total CHOL/HDL Ratio: 6
Triglycerides: 207 mg/dL — ABNORMAL HIGH (ref 0.0–149.0)
VLDL: 41.4 mg/dL — ABNORMAL HIGH (ref 0.0–40.0)

## 2020-09-29 LAB — LDL CHOLESTEROL, DIRECT: Direct LDL: 127 mg/dL

## 2020-09-29 MED ORDER — ONDANSETRON 4 MG PO TBDP: 4.0000 mg | ORAL_TABLET | Freq: Two times a day (BID) | ORAL | 0 refills | Status: DC | PRN

## 2020-09-29 NOTE — Progress Notes (Signed)
Patient ID: Heather [name], female   DOB: Feb 07, 1976, 45 y.o.   MRN: [id number]   Subjective:    Patient ID: Heather [name], female    DOB: 1976-07-28, 45 y.o.   MRN: [id number]  HPI This visit occurred during the SARS-CoV-2 public health emergency.  Safety protocols were in place, including screening questions prior to the visit, additional usage of staff PPE, and extensive cleaning of exam room while observing appropriate contact time as indicated for disinfecting solutions.  Patient here for a scheduled follow up.  F/u - recent covid infection.  Also has asthma.  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  Followed by pulmonary.  She is doing better overall.  Breathing overall is better.  Brain fog is getting better. Back at work.  Taste and smell still affected.  Still with some fatigue.  She is changing her work schedule.  She is going to work 4 10 hour shifts instead of 2 12 hour shifts.  With intermittent flares - breathing.  Using trelegy.  Helping.  Does report increased nausea.  Taking zofran prn.  If has nausea and tries to eat something, nausea worsens.  Can eat without experiencing nausea.  Taking pepcid.  No vomiting.  Has been a continued intermittent issue for her.  Previously saw GI.  Was scheduled to have EGD.  States postponed secondary to covid. Would like to reschedule.   Past Medical History:  Diagnosis Date  . Asthma   . Depression    bipolar  . Fainting spell    H/O  . H/O cardiac arrhythmia   . Migraines   . Seizures ([place])    Past Surgical History:  Procedure Laterality Date  . TUBAL LIGATION Bilateral 09/2002   Family History  Problem Relation Age of Onset  . Hypertension Mother   . Diabetes Mother   . Arthritis Mother   . Heart disease Mother   . Breast cancer Neg Hx    Social History   Socioeconomic History  . Marital status: Married    Spouse name: Not on file  . Number of children: Not on file  . Years of education: Not on file  . Highest education  level: Not on file  Occupational History  . Not on file  Tobacco Use  . Smoking status: Former Smoker    Packs/day: 0.50    Years: 5.00    Pack years: 2.50    Types: Cigarettes  . Smokeless tobacco: Never Used  . Tobacco comment: Quit mid October 2014  Substance and Sexual Activity  . Alcohol use: No    Alcohol/week: 0.0 standard drinks  . Drug use: No  . Sexual activity: Not on file  Other Topics Concern  . Not on file  Social History Narrative  . Not on file   Social Determinants of Health   Financial Resource Strain: Not on file  Food Insecurity: Not on file  Transportation Needs: Not on file  Physical Activity: Not on file  Stress: Not on file  Social Connections: Not on file    Outpatient Encounter Medications as of 09/29/2020  Medication Sig  . albuterol (VENTOLIN HFA) 108 (90 Base) MCG/ACT inhaler Inhale 1-2 puffs into the lungs every 4 (four) hours as needed for wheezing or shortness of breath.  . cetirizine (ZYRTEC ALLERGY) 10 MG tablet Take 1 tablet (10 mg total) by mouth daily.  . famotidine (PEPCID) 20 MG tablet Take 1 tablet (20 mg total) by mouth daily.  . fluticasone (FLONASE) 50  MCG/ACT nasal spray Place 1 spray into both nostrils daily.  . Fluticasone-Umeclidin-Vilant (TRELEGY ELLIPTA) 200-62.5-25 MCG/INH AEPB Inhale 1 puff into the lungs daily.  Marland Kitchen ipratropium-albuterol (DUONEB) 0.5-2.5 (3) MG/3ML SOLN Inhale 3 mLs into the lungs every 4 (four) hours as needed.  . [DISCONTINUED] Magnesium 200 MG TABS Take by mouth.   . montelukast (SINGULAIR) 10 MG tablet Take 1 tablet (10 mg total) by mouth at bedtime.  . ondansetron (ZOFRAN ODT) 4 MG disintegrating tablet Take 1 tablet (4 mg total) by mouth 2 (two) times daily as needed for nausea or vomiting.  . rizatriptan (MAXALT-MLT) 10 MG disintegrating tablet TAKE ONE TABLET BY MOUTH ONCE AS NEEDED FOR MIGRAINE FOR UP TO ONE DOSE. MAY TAKE A SECOND DOSE AFTER TWO HOURS IF NEEDED.  . [DISCONTINUED] diazepam (VALIUM) 10  MG tablet TAKE 1 TABLET BY MOUTH 4 TIMES DAILY AS NEEDED (Patient not taking: Reported on 09/29/2020)  . [DISCONTINUED] nortriptyline (PAMELOR) 10 MG capsule Take 30 mg nightly for two weeks then increase to 40 mg at night and continue that dose (Patient not taking: Reported on 09/29/2020)  . [DISCONTINUED] ondansetron (ZOFRAN ODT) 4 MG disintegrating tablet Take 1 tablet (4 mg total) by mouth 2 (two) times daily as needed for nausea or vomiting.  . [DISCONTINUED] predniSONE (DELTASONE) 10 MG tablet Take 6 tablets x 1 day and then decrease by 1/2 tablet per day until down to zero mg.  . [DISCONTINUED] predniSONE (DELTASONE) 10 MG tablet 1 tab BID x7d then 1 tab x7d and stop  . [DISCONTINUED] promethazine-dextromethorphan (PROMETHAZINE-DM) 6.25-15 MG/5ML syrup Take 5 mLs by mouth 4 (four) times daily as needed for cough.  . [DISCONTINUED] traZODone (DESYREL) 100 MG tablet Take 1 tablet (100 mg total) by mouth at bedtime. (Patient not taking: Reported on 09/29/2020)  . [DISCONTINUED] triamcinolone cream (KENALOG) 0.1 % Apply 1 application topically 2 (two) times daily. (Patient not taking: Reported on 09/29/2020)   No facility-administered encounter medications on file as of 09/29/2020.    Review of Systems  Constitutional: Positive for fatigue. Negative for unexpected weight change.  HENT: Negative for sore throat.        No increased sinus congestion or pressure.   Respiratory:       Breathing better.  Flares intermittent.  trelegy helping.    Cardiovascular: Negative for chest pain, palpitations and leg swelling.  Gastrointestinal: Positive for nausea. Negative for diarrhea and vomiting.  Genitourinary: Negative for difficulty urinating and dysuria.  Musculoskeletal: Negative for joint swelling and myalgias.  Skin: Negative for color change and rash.  Neurological: Negative for dizziness, light-headedness and headaches.  Psychiatric/Behavioral: Negative for agitation and dysphoric mood.        Objective:    Physical Exam Vitals reviewed.  Constitutional:      General: She is not in acute distress.    Appearance: Normal appearance.  HENT:     Head: Normocephalic and atraumatic.     Right Ear: External ear normal.     Left Ear: External ear normal.     Mouth/Throat:     Mouth: Oropharynx is clear and moist.  Eyes:     General: No scleral icterus.       Right eye: No discharge.        Left eye: No discharge.     Conjunctiva/sclera: Conjunctivae normal.  Neck:     Thyroid: No thyromegaly.  Cardiovascular:     Rate and Rhythm: Normal rate and regular rhythm.  Pulmonary:  Effort: No respiratory distress.     Breath sounds: Normal breath sounds. No wheezing.  Abdominal:     General: Bowel sounds are normal.     Palpations: Abdomen is soft.     Tenderness: There is no abdominal tenderness.  Musculoskeletal:        General: No swelling, tenderness or edema.     Cervical back: Neck supple. No tenderness.  Lymphadenopathy:     Cervical: No cervical adenopathy.  Skin:    Findings: No erythema or rash.  Neurological:     Mental Status: [name].  Psychiatric:        Mood and Affect: Mood normal.        Behavior: Behavior normal.     BP 120/78   Pulse 86   Temp (!) 97.3 F (36.3 C) (Oral)   Resp 16   Ht 5\' 6"  (1.676 m)   Wt 178 lb 9.6 oz (81 kg)   SpO2 97%   BMI 28.83 kg/m  Wt Readings from Last 3 Encounters:  09/29/20 178 lb 9.6 oz (81 kg)  09/02/20 181 lb (82.1 kg)  08/31/20 182 lb (82.6 kg)     Lab Results  Component Value Date   WBC 12.6 (H) 09/29/2020   HGB 13.7 09/29/2020   HCT 40.2 09/29/2020   PLT 263.0 09/29/2020   GLUCOSE 92 09/29/2020   CHOL 188 09/29/2020   TRIG 207.0 (H) 09/29/2020   HDL 33.00 (L) 09/29/2020   LDLDIRECT 127.0 09/29/2020   LDLCALC 141 (H) 11/19/2017   ALT 16 09/29/2020   AST 14 09/29/2020   NA 139 09/29/2020   K 3.6 09/29/2020   CL 107 09/29/2020   CREATININE 0.76 09/29/2020   BUN 9 09/29/2020   CO2 24  09/29/2020   TSH 0.90 09/29/2020    DG Chest 2 View  Result Date: 06/16/2020 CLINICAL DATA:  Shortness of breath. EXAM: CHEST - 2 VIEW COMPARISON:  September 15, 2019. FINDINGS: The heart size and mediastinal contours are within normal limits. Both lungs are clear. No pneumothorax or pleural effusion is noted. The visualized skeletal structures are unremarkable. IMPRESSION: No active cardiopulmonary disease. Electronically Signed   By: Marijo Conception M.D.   On: 06/16/2020 15:29       Assessment & Plan:   Problem List Items Addressed This Visit    Seizure disorder ([place])    Followed by neurology.  Stable.       Asthma    On trelegy now.  Helping.  Still with intermittent flares.  Overall breathing better.  Follow.       Hypercholesterolemia    Low cholesterol diet and exercise.  Follow lipid panel.       GERD (gastroesophageal reflux disease)    Taking pepcid.  Persistent nausea.  No abdominal pain.  Given persistent intermittent nausea will refer to GI for further evaluation.        Relevant Medications   ondansetron (ZOFRAN ODT) 4 MG disintegrating tablet   History of COVID-19    Diagnosed 06/09/20.  Received monoclonal antibody infusion.  Breathing overall better.  Energy is improving, but still with increased fatigue.  Brain fog is improving.  Continue trelegy.  Helping.  Follow.       Nausea    Persistent intermittent issue.  Refill zofran to have if needed.  Refer to GI for further evaluation.  No increased abdominal pain.  Continue pepcid. Previously saw GI.  Was scheduled for EGD.  Postponed secondary to covid.  Desires to reschedule.        Relevant Orders   Ambulatory referral to Gastroenterology   Fatigue    Improving.  Plans to change work schedule as outlined.        Relevant Orders   CBC with Differential/Platelet (Completed)   Hepatic function panel (Completed)   TSH (Completed)   Basic metabolic panel (Completed)    Other Visit Diagnoses    Screening  cholesterol level       Relevant Orders   Lipid panel (Completed)       [place], MD

## 2020-10-04 ENCOUNTER — Encounter: Payer: Self-pay | Admitting: Internal Medicine

## 2020-10-04 NOTE — Assessment & Plan Note (Signed)
Diagnosed 06/09/20.  Received monoclonal antibody infusion.  Breathing overall better.  Energy is improving, but still with increased fatigue.  Brain fog is improving.  Continue trelegy.  Helping.  Follow.

## 2020-10-04 NOTE — Assessment & Plan Note (Signed)
Improving.  Plans to change work schedule as outlined.

## 2020-10-04 NOTE — Assessment & Plan Note (Signed)
On trelegy now.  Helping.  Still with intermittent flares.  Overall breathing better.  Follow.

## 2020-10-04 NOTE — Assessment & Plan Note (Signed)
Followed by neurology.  Stable  

## 2020-10-04 NOTE — Assessment & Plan Note (Addendum)
Persistent intermittent issue.  Refill zofran to have if needed.  Refer to GI for further evaluation.  No increased abdominal pain.  Continue pepcid. Previously saw GI.  Was scheduled for EGD.  Postponed secondary to covid.  Desires to reschedule.

## 2020-10-04 NOTE — Assessment & Plan Note (Signed)
Taking pepcid.  Persistent nausea.  No abdominal pain.  Given persistent intermittent nausea will refer to GI for further evaluation.

## 2020-10-04 NOTE — Assessment & Plan Note (Signed)
Low cholesterol diet and exercise.  Follow lipid panel.   

## 2020-11-08 ENCOUNTER — Other Ambulatory Visit: Payer: Self-pay | Admitting: Internal Medicine

## 2020-11-26 ENCOUNTER — Other Ambulatory Visit: Payer: Self-pay | Admitting: Pulmonary Disease

## 2020-11-26 MED ORDER — TRELEGY ELLIPTA 200-62.5-25 MCG/INH IN AEPB: 1.0000 | INHALATION_SPRAY | Freq: Every day | RESPIRATORY_TRACT | 2 refills | Status: DC

## 2020-12-08 ENCOUNTER — Other Ambulatory Visit: Payer: Self-pay | Admitting: Internal Medicine

## 2020-12-09 MED ORDER — RIZATRIPTAN BENZOATE 10 MG PO TBDP: ORAL_TABLET | ORAL | 0 refills | Status: DC

## 2020-12-09 MED ORDER — ONDANSETRON 4 MG PO TBDP: 4.0000 mg | ORAL_TABLET | Freq: Three times a day (TID) | ORAL | 0 refills | Status: DC | PRN

## 2021-01-08 ENCOUNTER — Other Ambulatory Visit: Payer: Self-pay | Admitting: Internal Medicine

## 2021-01-28 ENCOUNTER — Encounter: Payer: BC Managed Care – PPO | Admitting: Internal Medicine

## 2021-02-04 ENCOUNTER — Ambulatory Visit (INDEPENDENT_AMBULATORY_CARE_PROVIDER_SITE_OTHER): Payer: BC Managed Care – PPO | Admitting: Internal Medicine

## 2021-02-04 ENCOUNTER — Other Ambulatory Visit: Payer: Self-pay

## 2021-02-04 VITALS — BP 140/78 | HR 90 | Temp 97.2°F | Resp 16 | Ht 66.0 in | Wt 169.0 lb

## 2021-02-04 DIAGNOSIS — E7800 Pure hypercholesterolemia, unspecified: Secondary | ICD-10-CM

## 2021-02-04 DIAGNOSIS — R5383 Other fatigue: Secondary | ICD-10-CM | POA: Diagnosis not present

## 2021-02-04 DIAGNOSIS — G40909 Epilepsy, unspecified, not intractable, without status epilepticus: Secondary | ICD-10-CM

## 2021-02-04 DIAGNOSIS — Z1231 Encounter for screening mammogram for malignant neoplasm of breast: Secondary | ICD-10-CM | POA: Diagnosis not present

## 2021-02-04 DIAGNOSIS — G43809 Other migraine, not intractable, without status migrainosus: Secondary | ICD-10-CM

## 2021-02-04 DIAGNOSIS — J4541 Moderate persistent asthma with (acute) exacerbation: Secondary | ICD-10-CM

## 2021-02-04 DIAGNOSIS — R110 Nausea: Secondary | ICD-10-CM

## 2021-02-04 DIAGNOSIS — E559 Vitamin D deficiency, unspecified: Secondary | ICD-10-CM

## 2021-02-04 DIAGNOSIS — Z8616 Personal history of COVID-19: Secondary | ICD-10-CM

## 2021-02-04 DIAGNOSIS — K219 Gastro-esophageal reflux disease without esophagitis: Secondary | ICD-10-CM

## 2021-02-04 LAB — HEPATIC FUNCTION PANEL
ALT: 11 U/L (ref 0–35)
AST: 12 U/L (ref 0–37)
Albumin: 4.4 g/dL (ref 3.5–5.2)
Alkaline Phosphatase: 126 U/L — ABNORMAL HIGH (ref 39–117)
Bilirubin, Direct: 0.1 mg/dL (ref 0.0–0.3)
Total Bilirubin: 0.5 mg/dL (ref 0.2–1.2)
Total Protein: 6.6 g/dL (ref 6.0–8.3)

## 2021-02-04 LAB — CBC WITH DIFFERENTIAL/PLATELET
Basophils Absolute: 0.1 10*3/uL (ref 0.0–0.1)
Basophils Relative: 0.8 % (ref 0.0–3.0)
Eosinophils Absolute: 0.3 10*3/uL (ref 0.0–0.7)
Eosinophils Relative: 2 % (ref 0.0–5.0)
HCT: 42.2 % (ref 36.0–46.0)
Hemoglobin: 14.2 g/dL (ref 12.0–15.0)
Lymphocytes Relative: 22.5 % (ref 12.0–46.0)
Lymphs Abs: 3.6 10*3/uL (ref 0.7–4.0)
MCHC: 33.6 g/dL (ref 30.0–36.0)
MCV: 84.3 fl (ref 78.0–100.0)
Monocytes Absolute: 1 10*3/uL (ref 0.1–1.0)
Monocytes Relative: 6 % (ref 3.0–12.0)
Neutro Abs: 11.1 10*3/uL — ABNORMAL HIGH (ref 1.4–7.7)
Neutrophils Relative %: 68.7 % (ref 43.0–77.0)
Platelets: 270 10*3/uL (ref 150.0–400.0)
RBC: 5 Mil/uL (ref 3.87–5.11)
RDW: 14 % (ref 11.5–15.5)
WBC: 16.2 10*3/uL — ABNORMAL HIGH (ref 4.0–10.5)

## 2021-02-04 LAB — LIPID PANEL
Cholesterol: 219 mg/dL — ABNORMAL HIGH (ref 0–200)
HDL: 45.4 mg/dL (ref >39.00)
LDL Cholesterol: 141 mg/dL — ABNORMAL HIGH (ref 0–99)
NonHDL: 173.38
Total CHOL/HDL Ratio: 5
Triglycerides: 161 mg/dL — ABNORMAL HIGH (ref 0.0–149.0)
VLDL: 32.2 mg/dL (ref 0.0–40.0)

## 2021-02-04 LAB — BASIC METABOLIC PANEL
BUN: 9 mg/dL (ref 6–23)
CO2: 24 mEq/L (ref 19–32)
Calcium: 9.5 mg/dL (ref 8.4–10.5)
Chloride: 104 mEq/L (ref 96–112)
Creatinine, Ser: 0.78 mg/dL (ref 0.40–1.20)
GFR: 91.53 mL/min (ref >60.00)
Glucose, Bld: 92 mg/dL (ref 70–99)
Potassium: 3.8 mEq/L (ref 3.5–5.1)
Sodium: 139 mEq/L (ref 135–145)

## 2021-02-04 LAB — TSH: TSH: 2.07 u[IU]/mL (ref 0.35–4.50)

## 2021-02-04 LAB — VITAMIN D 25 HYDROXY (VIT D DEFICIENCY, FRACTURES): VITD: 30.03 ng/mL (ref 30.00–100.00)

## 2021-02-04 MED ORDER — MAGNESIUM OXIDE -MG SUPPLEMENT 400 (240 MG) MG PO TABS: 400.0000 mg | ORAL_TABLET | Freq: Every day | ORAL | 2 refills | Status: DC

## 2021-02-04 NOTE — Progress Notes (Signed)
Patient ID: LASHONNE SHULL, female   DOB: [birth year], 45 y.o.   MRN: [id number]   Subjective:    Patient ID: [name], female    DOB: [birth year], 45 y.o.   MRN: [id number]  HPI This visit occurred during the SARS-CoV-2 public health emergency.  Safety protocols were in place, including screening questions prior to the visit, additional usage of staff PPE, and extensive cleaning of exam room while observing appropriate contact time as indicated for disinfecting solutions.  Patient here for work in appt.  Work in to discuss increased stress - related to persistent symptoms and problems - post covid. Diagnosed with covid 05/2020.  States she still cannot smell or taste normally.  Still with short term memory issues.  Increased fatigue.  Gets full faster.  Feels her eating issues are related to not being able to taste.  Some nausea.  Also constipation has been an intermittent issue.  Using MOM or suppository.  Headaches are becoming more of an issue.  Discussed taking magnesium.  Increased stress related to above.  Increased anxiety.  Tired of being tired and tired of ongoing symptoms.  Feels she needs to get back in with her psychiatrist.  Has lost weight.  Breathing overall relatively stable.  No increased sob or congestion.  Still dealing with increased stress related to her father's passing.    Past Medical History:  Diagnosis Date  . Asthma   . Depression    bipolar  . Fainting spell    H/O  . H/O cardiac arrhythmia   . Migraines   . Seizures ([place])    Past Surgical History:  Procedure Laterality Date  . TUBAL LIGATION Bilateral 09/2002   Family History  Problem Relation Age of Onset  . Hypertension Mother   . Diabetes Mother   . Arthritis Mother   . Heart disease Mother   . Breast cancer Neg Hx    Social History   Socioeconomic History  . Marital status: Married    Spouse name: Not on file  . Number of children: Not on file  . Years of education: Not on file  . Highest  education level: Not on file  Occupational History  . Not on file  Tobacco Use  . Smoking status: Former Smoker    Packs/day: 0.50    Years: 5.00    Pack years: 2.50    Types: Cigarettes  . Smokeless tobacco: Never Used  . Tobacco comment: Quit mid October 2014  Substance and Sexual Activity  . Alcohol use: No    Alcohol/week: 0.0 standard drinks  . Drug use: No  . Sexual activity: Not on file  Other Topics Concern  . Not on file  Social History Narrative  . Not on file   Social Determinants of Health   Financial Resource Strain: Not on file  Food Insecurity: Not on file  Transportation Needs: Not on file  Physical Activity: Not on file  Stress: Not on file  Social Connections: Not on file    Outpatient Encounter Medications as of 02/04/2021  Medication Sig  . albuterol (VENTOLIN HFA) 108 (90 Base) MCG/ACT inhaler Inhale 1-2 puffs into the lungs every 4 (four) hours as needed for wheezing or shortness of breath.  . cetirizine (ZYRTEC ALLERGY) 10 MG tablet Take 1 tablet (10 mg total) by mouth daily.  . famotidine (PEPCID) 20 MG tablet Take 1 tablet (20 mg total) by mouth daily.  . fluticasone (FLONASE) 50 MCG/ACT nasal spray Place 1 spray  into both nostrils daily.  . Fluticasone-Umeclidin-Vilant (TRELEGY ELLIPTA) 200-62.5-25 MCG/INH AEPB Inhale 1 puff into the lungs daily.  Marland Kitchen ipratropium-albuterol (DUONEB) 0.5-2.5 (3) MG/3ML SOLN Inhale 3 mLs into the lungs every 4 (four) hours as needed.  . magnesium oxide (MAG-OX) 400 (240 Mg) MG tablet Take 1 tablet (400 mg total) by mouth daily.  . montelukast (SINGULAIR) 10 MG tablet Take 1 tablet (10 mg total) by mouth at bedtime.  . ondansetron (ZOFRAN-ODT) 4 MG disintegrating tablet DISSOLVE 1 TABLET IN MOUTH EVERY 8 HOURS AS NEEDED FOR NAUSEA FOR VOMITING  . ondansetron (ZOFRAN-ODT) 4 MG disintegrating tablet DISSOLVE 1 TABLET IN MOUTH TWICE DAILY AS NEEDED FOR NAUSEA FOR VOMITING  . rizatriptan (MAXALT-MLT) 10 MG disintegrating  tablet TAKE 1 TABLET BY MOUTH ONCE AS NEEDED FOR ONSET OF MIGRAINE - MAY TAKE A SECOND DOSE AFTER 2 HOURS IF NEEDED   No facility-administered encounter medications on file as of 02/04/2021.    Review of Systems  Constitutional:       Appetite change as outlined.  Weight loss.    HENT: Negative for congestion and sinus pressure.   Respiratory: Negative for cough and chest tightness.        Breathing stable.   Cardiovascular: Negative for chest pain, palpitations and leg swelling.  Gastrointestinal: Positive for nausea. Negative for abdominal pain and vomiting.       Some constipation as outlined.   Genitourinary: Negative for difficulty urinating and dysuria.  Musculoskeletal: Negative for joint swelling and myalgias.  Skin: Negative for color change and rash.  Neurological: Positive for headaches. Negative for dizziness.  Psychiatric/Behavioral:       Increased stress and anxiety as outlined.         Objective:    Physical Exam Vitals reviewed.  Constitutional:      General: She is not in acute distress.    Appearance: Normal appearance.  HENT:     Head: Normocephalic and atraumatic.     Right Ear: External ear normal.     Left Ear: External ear normal.  Eyes:     General: No scleral icterus.       Right eye: No discharge.        Left eye: No discharge.     Conjunctiva/sclera: Conjunctivae normal.  Neck:     Thyroid: No thyromegaly.  Cardiovascular:     Rate and Rhythm: Normal rate and regular rhythm.  Pulmonary:     Effort: No respiratory distress.     Breath sounds: Normal breath sounds. No wheezing.  Abdominal:     General: Bowel sounds are normal.     Palpations: Abdomen is soft.     Tenderness: There is no abdominal tenderness.  Musculoskeletal:        General: No swelling or tenderness.     Cervical back: Neck supple. No tenderness.  Lymphadenopathy:     Cervical: No cervical adenopathy.  Skin:    Findings: No erythema or rash.  Neurological:     Mental  Status: [name].  Psychiatric:        Mood and Affect: Mood normal.        Behavior: Behavior normal.     BP 140/78   Pulse 90   Temp (!) 97.2 F (36.2 C)   Resp 16   Ht 5\' 6"  (1.676 m)   Wt 169 lb (76.7 kg)   SpO2 98%   BMI 27.28 kg/m  Wt Readings from Last 3 Encounters:  02/04/21 169 lb (  76.7 kg)  09/29/20 178 lb 9.6 oz (81 kg)  09/02/20 181 lb (82.1 kg)     Lab Results  Component Value Date   WBC 16.2 (H) 02/04/2021   HGB 14.2 02/04/2021   HCT 42.2 02/04/2021   PLT 270.0 02/04/2021   GLUCOSE 92 02/04/2021   CHOL 219 (H) 02/04/2021   TRIG 161.0 (H) 02/04/2021   HDL 45.40 02/04/2021   LDLDIRECT 127.0 09/29/2020   LDLCALC 141 (H) 02/04/2021   ALT 11 02/04/2021   AST 12 02/04/2021   NA 139 02/04/2021   K 3.8 02/04/2021   CL 104 02/04/2021   CREATININE 0.78 02/04/2021   BUN 9 02/04/2021   CO2 24 02/04/2021   TSH 2.07 02/04/2021    DG Chest 2 View  Result Date: 06/16/2020 CLINICAL DATA:  Shortness of breath. EXAM: CHEST - 2 VIEW COMPARISON:  September 15, 2019. FINDINGS: The heart size and mediastinal contours are within normal limits. Both lungs are clear. No pneumothorax or pleural effusion is noted. The visualized skeletal structures are unremarkable. IMPRESSION: No active cardiopulmonary disease. Electronically Signed   By: Marijo Conception M.D.   On: 06/16/2020 15:29       Assessment & Plan:   Problem List Items Addressed This Visit    Asthma    On trelegy now.  Helped breathing.  Overall stable.  Follow.       Fatigue    Persistent increased fatigue as outlined.  Discussed.  Check labs to confirm no metabolic etiology.  Post covid - persistent symptoms.  Breathing stable.       Relevant Orders   CBC with Differential/Platelet (Completed)   Hepatic function panel (Completed)   TSH (Completed)   Basic metabolic panel (Completed)   GERD (gastroesophageal reflux disease)    No increased acid reflux reported.  Continue pepcid.       History of  COVID-19    Diagnosed 05/2020.  Received monoclonal antibody infusion.  Breathing better.  Still with altered taste and smell.  Still with increased fatigue.  Increased anxiety related. Discussed.  Increased stress related to persistent symptoms.  Discussed with her today.  Request f/u with psychiatry.        Hypercholesterolemia    Low cholesterol diet and exercise.  Follow lipid panel.       Relevant Orders   Lipid panel (Completed)   Migraine    Has been evaluated previously by Dr Melrose Nakayama.  Had been doing well.  Some increase recently.  Discussed mag oxide.  Start 400mg  q day.  Follow.       Nausea    Intermittent issue.  Has altered sense of taste and smell. Could be contributing.  Previously saw GI.  Was scheduled for EGD.  Postponed secondary to covid.  Needs to f/u with GI for evaluation and further w/up.       Seizure disorder [facility])    Has been followed by neurology.  Stable.       Vitamin D deficiency    Check vitamin D level today.        Relevant Orders   VITAMIN D 25 Hydroxy (Vit-D Deficiency, Fractures) (Completed)    Other Visit Diagnoses    Visit for screening mammogram    -  Primary   Relevant Orders   MM 3D SCREEN BREAST BILATERAL       Einar Pheasant, MD

## 2021-02-08 ENCOUNTER — Other Ambulatory Visit: Payer: Self-pay | Admitting: Internal Medicine

## 2021-02-08 DIAGNOSIS — D72829 Elevated white blood cell count, unspecified: Secondary | ICD-10-CM

## 2021-02-08 DIAGNOSIS — R748 Abnormal levels of other serum enzymes: Secondary | ICD-10-CM

## 2021-02-08 NOTE — Progress Notes (Signed)
Order placed for f/u labs.  

## 2021-02-09 ENCOUNTER — Other Ambulatory Visit: Payer: Self-pay | Admitting: Internal Medicine

## 2021-02-09 DIAGNOSIS — R110 Nausea: Secondary | ICD-10-CM

## 2021-02-09 DIAGNOSIS — R748 Abnormal levels of other serum enzymes: Secondary | ICD-10-CM

## 2021-02-09 DIAGNOSIS — R6881 Early satiety: Secondary | ICD-10-CM

## 2021-02-09 NOTE — Progress Notes (Signed)
Order placed for abdominal ultrasound.   

## 2021-02-11 ENCOUNTER — Other Ambulatory Visit: Payer: Self-pay | Admitting: Internal Medicine

## 2021-02-11 ENCOUNTER — Telehealth: Payer: Self-pay | Admitting: Internal Medicine

## 2021-02-11 NOTE — Telephone Encounter (Signed)
lft pt a vm to call ofc to sch US abdomen complete. thanks

## 2021-02-13 ENCOUNTER — Encounter: Payer: Self-pay | Admitting: Internal Medicine

## 2021-02-13 ENCOUNTER — Telehealth: Payer: Self-pay | Admitting: Internal Medicine

## 2021-02-13 NOTE — Assessment & Plan Note (Signed)
Diagnosed 05/2020.  Received monoclonal antibody infusion.  Breathing better.  Still with altered taste and smell.  Still with increased fatigue.  Increased anxiety related. Discussed.  Increased stress related to persistent symptoms.  Discussed with her today.  Request f/u with psychiatry.

## 2021-02-13 NOTE — Telephone Encounter (Signed)
See chat message.  Please call pt and inform her that I did contact Dr Starleen Arms office.  They can schedule her for an appt in 04/2021 and put on cancellation list.  She would need to call them and confirm that she wants the appt in 04/2021.

## 2021-02-13 NOTE — Assessment & Plan Note (Signed)
Persistent increased fatigue as outlined.  Discussed.  Check labs to confirm no metabolic etiology.  Post covid - persistent symptoms.  Breathing stable.

## 2021-02-13 NOTE — Assessment & Plan Note (Signed)
No increased acid reflux reported.  Continue pepcid.

## 2021-02-13 NOTE — Assessment & Plan Note (Signed)
Has been followed by neurology.  Stable.  

## 2021-02-13 NOTE — Assessment & Plan Note (Signed)
Has been evaluated previously by Dr Melrose Nakayama.  Had been doing well.  Some increase recently.  Discussed mag oxide.  Start 400mg  q day.  Follow.

## 2021-02-13 NOTE — Assessment & Plan Note (Signed)
On trelegy now.  Helped breathing.  Overall stable.  Follow.

## 2021-02-13 NOTE — Assessment & Plan Note (Signed)
Check vitamin D level today 

## 2021-02-13 NOTE — Assessment & Plan Note (Signed)
Intermittent issue.  Has altered sense of taste and smell. Could be contributing.  Previously saw GI.  Was scheduled for EGD.  Postponed secondary to covid.  Needs to f/u with GI for evaluation and further w/up.

## 2021-02-13 NOTE — Assessment & Plan Note (Signed)
Low cholesterol diet and exercise.  Follow lipid panel.   

## 2021-02-14 ENCOUNTER — Telehealth: Payer: Self-pay | Admitting: Internal Medicine

## 2021-02-14 MED ORDER — RIZATRIPTAN BENZOATE 10 MG PO TBDP: ORAL_TABLET | ORAL | 0 refills | Status: DC

## 2021-02-14 MED ORDER — ONDANSETRON 4 MG PO TBDP: 4.0000 mg | ORAL_TABLET | Freq: Once | ORAL | 0 refills | Status: AC

## 2021-02-14 NOTE — Telephone Encounter (Signed)
lft pt a vm to call ofc to sch US. thanks 

## 2021-02-14 NOTE — Telephone Encounter (Signed)
Patient returned office phone call. 

## 2021-02-14 NOTE — Telephone Encounter (Signed)
LMTCB

## 2021-02-14 NOTE — Telephone Encounter (Signed)
Pt returned your call.  

## 2021-02-15 NOTE — Telephone Encounter (Signed)
LMTCB

## 2021-02-15 NOTE — Telephone Encounter (Signed)
She is going to call Dr Malachy Moan office. She would like to get the August appt.

## 2021-02-16 ENCOUNTER — Telehealth: Payer: Self-pay | Admitting: Internal Medicine

## 2021-02-16 NOTE — Telephone Encounter (Signed)
lft pt a vm to call ofc to sch [place] thanks

## 2021-02-18 ENCOUNTER — Other Ambulatory Visit (INDEPENDENT_AMBULATORY_CARE_PROVIDER_SITE_OTHER): Payer: BC Managed Care – PPO

## 2021-02-18 ENCOUNTER — Other Ambulatory Visit: Payer: Self-pay

## 2021-02-18 DIAGNOSIS — D72829 Elevated white blood cell count, unspecified: Secondary | ICD-10-CM

## 2021-02-18 DIAGNOSIS — R748 Abnormal levels of other serum enzymes: Secondary | ICD-10-CM | POA: Diagnosis not present

## 2021-02-18 LAB — HEPATIC FUNCTION PANEL
ALT: 12 U/L (ref 0–35)
AST: 12 U/L (ref 0–37)
Albumin: 4.4 g/dL (ref 3.5–5.2)
Alkaline Phosphatase: 123 U/L — ABNORMAL HIGH (ref 39–117)
Bilirubin, Direct: 0.1 mg/dL (ref 0.0–0.3)
Total Bilirubin: 0.4 mg/dL (ref 0.2–1.2)
Total Protein: 6.5 g/dL (ref 6.0–8.3)

## 2021-02-18 LAB — CBC WITH DIFFERENTIAL/PLATELET
Basophils Absolute: 0.2 10*3/uL — ABNORMAL HIGH (ref 0.0–0.1)
Basophils Relative: 0.9 % (ref 0.0–3.0)
Eosinophils Absolute: 0.2 10*3/uL (ref 0.0–0.7)
Eosinophils Relative: 1.2 % (ref 0.0–5.0)
HCT: 41.1 % (ref 36.0–46.0)
Hemoglobin: 14.1 g/dL (ref 12.0–15.0)
Lymphocytes Relative: 19.2 % (ref 12.0–46.0)
Lymphs Abs: 3.6 10*3/uL (ref 0.7–4.0)
MCHC: 34.2 g/dL (ref 30.0–36.0)
MCV: 83.7 fl (ref 78.0–100.0)
Monocytes Absolute: 1 10*3/uL (ref 0.1–1.0)
Monocytes Relative: 5.3 % (ref 3.0–12.0)
Neutro Abs: 13.7 10*3/uL — ABNORMAL HIGH (ref 1.4–7.7)
Neutrophils Relative %: 73.4 % (ref 43.0–77.0)
Platelets: 283 10*3/uL (ref 150.0–400.0)
RBC: 4.91 Mil/uL (ref 3.87–5.11)
RDW: 14.2 % (ref 11.5–15.5)
WBC: 18.7 10*3/uL (ref 4.0–10.5)

## 2021-02-18 LAB — GAMMA GT: GGT: 23 U/L (ref 7–51)

## 2021-02-18 NOTE — Telephone Encounter (Signed)
Pt came in for labs and wanted to set up [place]  She said the best time to reach her is after 3pm

## 2021-02-22 ENCOUNTER — Telehealth: Payer: Self-pay | Admitting: *Deleted

## 2021-02-22 NOTE — Telephone Encounter (Signed)
-----   Message from Einar Pheasant, MD sent at 02/19/2021 11:19 AM EDT ----- Heather [name] was notified of lab results.  Please schedule f/u appt with me to reevaluate and plan for f/u cbc.  We have opening 8:00 02/23/21. Please schedule.

## 2021-02-23 NOTE — Telephone Encounter (Signed)
Left message to call back to schedule for 02/25/21 at 12 pm with Dr. Nicki Reaper.

## 2021-02-25 ENCOUNTER — Telehealth (INDEPENDENT_AMBULATORY_CARE_PROVIDER_SITE_OTHER): Payer: BC Managed Care – PPO | Admitting: Internal Medicine

## 2021-02-25 ENCOUNTER — Telehealth: Payer: Self-pay

## 2021-02-25 ENCOUNTER — Encounter: Payer: Self-pay | Admitting: Internal Medicine

## 2021-02-25 VITALS — Ht 66.0 in | Wt 169.0 lb

## 2021-02-25 DIAGNOSIS — D72829 Elevated white blood cell count, unspecified: Secondary | ICD-10-CM

## 2021-02-25 DIAGNOSIS — F419 Anxiety disorder, unspecified: Secondary | ICD-10-CM

## 2021-02-25 DIAGNOSIS — J4541 Moderate persistent asthma with (acute) exacerbation: Secondary | ICD-10-CM | POA: Diagnosis not present

## 2021-02-25 DIAGNOSIS — R059 Cough, unspecified: Secondary | ICD-10-CM

## 2021-02-25 DIAGNOSIS — Z8616 Personal history of COVID-19: Secondary | ICD-10-CM

## 2021-02-25 DIAGNOSIS — R109 Unspecified abdominal pain: Secondary | ICD-10-CM

## 2021-02-25 DIAGNOSIS — R110 Nausea: Secondary | ICD-10-CM

## 2021-02-25 DIAGNOSIS — K219 Gastro-esophageal reflux disease without esophagitis: Secondary | ICD-10-CM

## 2021-02-25 MED ORDER — AZITHROMYCIN 250 MG PO TABS: ORAL_TABLET | ORAL | 0 refills | Status: AC

## 2021-02-25 NOTE — Progress Notes (Deleted)
Patient ID: IDELLA [name], female   DOB: 01/01/1976, 45 y.o.   MRN: [id number]   Subjective:    Patient ID: Heather [name], female    DOB: December 06, 1975, 46 y.o.   MRN: [id number]  HPI This visit occurred during the SARS-CoV-2 public health emergency.  Safety protocols were in place, including screening questions prior to the visit, additional usage of staff PPE, and extensive cleaning of exam room while observing appropriate contact time as indicated for disinfecting solutions.  Patient here for work in appt. Work in for persistent   Past Medical History:  Diagnosis Date  . Asthma   . Depression    bipolar  . Fainting spell    H/O  . H/O cardiac arrhythmia   . Migraines   . Seizures ([place])    Past Surgical History:  Procedure Laterality Date  . TUBAL LIGATION Bilateral 09/2002   Family History  Problem Relation Age of Onset  . Hypertension Mother   . Diabetes Mother   . Arthritis Mother   . Heart disease Mother   . Breast cancer Neg Hx    Social History   Socioeconomic History  . Marital status: Married    Spouse name: Not on file  . Number of children: Not on file  . Years of education: Not on file  . Highest education level: Not on file  Occupational History  . Not on file  Tobacco Use  . Smoking status: Former Smoker    Packs/day: 0.50    Years: 5.00    Pack years: 2.50    Types: Cigarettes  . Smokeless tobacco: Never Used  . Tobacco comment: Quit mid October 2014  Substance and Sexual Activity  . Alcohol use: No    Alcohol/week: 0.0 standard drinks  . Drug use: No  . Sexual activity: Not on file  Other Topics Concern  . Not on file  Social History Narrative  . Not on file   Social Determinants of Health   Financial Resource Strain: Not on file  Food Insecurity: Not on file  Transportation Needs: Not on file  Physical Activity: Not on file  Stress: Not on file  Social Connections: Not on file    Outpatient Encounter Medications as of 02/25/2021   Medication Sig  . albuterol (VENTOLIN HFA) 108 (90 Base) MCG/ACT inhaler Inhale 1-2 puffs into the lungs every 4 (four) hours as needed for wheezing or shortness of breath.  . cetirizine (ZYRTEC ALLERGY) 10 MG tablet Take 1 tablet (10 mg total) by mouth daily.  . famotidine (PEPCID) 20 MG tablet Take 1 tablet (20 mg total) by mouth daily.  . fluticasone (FLONASE) 50 MCG/ACT nasal spray Place 1 spray into both nostrils daily.  . Fluticasone-Umeclidin-Vilant (TRELEGY ELLIPTA) 200-62.5-25 MCG/INH AEPB Inhale 1 puff into the lungs daily.  Marland Kitchen ipratropium-albuterol (DUONEB) 0.5-2.5 (3) MG/3ML SOLN Inhale 3 mLs into the lungs every 4 (four) hours as needed.  . magnesium oxide (MAG-OX) 400 (240 Mg) MG tablet Take 1 tablet (400 mg total) by mouth daily.  . montelukast (SINGULAIR) 10 MG tablet Take 1 tablet (10 mg total) by mouth at bedtime.  . ondansetron (ZOFRAN-ODT) 4 MG disintegrating tablet DISSOLVE 1 TABLET IN MOUTH TWICE DAILY AS NEEDED FOR NAUSEA FOR VOMITING  . rizatriptan (MAXALT-MLT) 10 MG disintegrating tablet TAKE 1 TABLET BY MOUTH ONCE AS NEEDED FOR ONSET OF MIGRAINE - MAY TAKE A SECOND DOSE AFTER 2 HOURS IF NEEDED   No facility-administered encounter medications on file as of 02/25/2021.  Review of Systems     Objective:    Physical Exam  Ht 5\' 6"  (1.676 m)   Wt 169 lb (76.7 kg)   BMI 27.28 kg/m  Wt Readings from Last 3 Encounters:  02/25/21 169 lb (76.7 kg)  02/04/21 169 lb (76.7 kg)  09/29/20 178 lb 9.6 oz (81 kg)     Lab Results  Component Value Date   WBC 18.7 cH (HH) 02/18/2021   HGB 14.1 02/18/2021   HCT 41.1 02/18/2021   PLT 283.0 02/18/2021   GLUCOSE 92 02/04/2021   CHOL 219 (H) 02/04/2021   TRIG 161.0 (H) 02/04/2021   HDL 45.40 02/04/2021   LDLDIRECT 127.0 09/29/2020   LDLCALC 141 (H) 02/04/2021   ALT 12 02/18/2021   AST 12 02/18/2021   NA 139 02/04/2021   K 3.8 02/04/2021   CL 104 02/04/2021   CREATININE 0.78 02/04/2021   BUN 9 02/04/2021   CO2 24  02/04/2021   TSH 2.07 02/04/2021    DG Chest 2 View  Result Date: 06/16/2020 CLINICAL DATA:  Shortness of breath. EXAM: CHEST - 2 VIEW COMPARISON:  September 15, 2019. FINDINGS: The heart size and mediastinal contours are within normal limits. Both lungs are clear. No pneumothorax or pleural effusion is noted. The visualized skeletal structures are unremarkable. IMPRESSION: No active cardiopulmonary disease. Electronically Signed   By: Marijo Conception M.D.   On: 06/16/2020 15:29       Assessment & Plan:   Problem List Items Addressed This Visit   None      Einar Pheasant, MD

## 2021-02-25 NOTE — Telephone Encounter (Signed)
LMTCB. Called patient to let her know that she will have to go to [place] to have chest xray done. Order placed

## 2021-02-27 ENCOUNTER — Encounter: Payer: Self-pay | Admitting: Internal Medicine

## 2021-02-27 DIAGNOSIS — F419 Anxiety disorder, unspecified: Secondary | ICD-10-CM | POA: Insufficient documentation

## 2021-02-27 MED ORDER — BUSPIRONE HCL 5 MG PO TABS: 5.0000 mg | ORAL_TABLET | Freq: Two times a day (BID) | ORAL | 1 refills | Status: DC | PRN

## 2021-02-27 NOTE — Assessment & Plan Note (Signed)
Has altered sense of taste and smell.  Nausea as outlined.  Taking zofran.  Some abdominal discomfort.  Scheduled for abdominal ultrasound.  See if can schedule an earlier appt.  Also needs f/u with GI.  Was scheduled for EGD.  Postponed for covid.

## 2021-02-27 NOTE — Assessment & Plan Note (Signed)
Persistent intermittent discomfort.  Discussed keeping bowels moving.  Acid reflux appears to be controlled.  Check abdominal ultrasound and f/u with GI as previously planned.

## 2021-02-27 NOTE — Progress Notes (Signed)
Patient ID: Heather [name], female   DOB: 16-Mar-1976, 45 y.o.   MRN: [id number]   Virtual Visit via video Note  This visit type was conducted due to national recommendations for restrictions regarding the COVID-19 pandemic (e.g. social distancing).  This format is felt to be most appropriate for this patient at this time.  All issues noted in this document were discussed and addressed.  No physical exam was performed (except for noted visual exam findings with Video Visits).   I connected with Heather [name] by a video enabled telemedicine application and verified that I am speaking with the correct person using two identifiers. Location patient: home Location provider: work Persons participating in the virtual visit: patient, provider  The limitations, risks, security and privacy concerns of performing an evaluation and management service by video and the availability of in person appointments have been discussed.  It has also been discussed with the patient that there may be a patient responsible charge related to this service. The patient expressed understanding and agreed to proceed.  Reason for visit: work in appt  HPI: She is here for work in appt. Work in for persistent leukocytosis.  Reports having increased sinus pressure.  Clear mucus production.  Watery/itchy eyes.  No chest pain.  Breathing ok.  She has been using her nebs a little more.  Still with increased anxiety and stress.  Has appt with her psychiatrist 04/06/21.  States she has previously taken valium and was requesting refill. Discussed other options.  Reports having tried several medications and valium worked best.  Discussed buspar.  She was agreeable.  Still with change in taste and smell.  Persistent intermittent nausea - takes zofran 4-5 days/week.  No acid reflux.  Some issues with constipation.  If increased constipation will have some vomiting then.  Some abdominal discomfort.  Scheduled for abdominal ultrasound 03/29/21.     ROS: See pertinent positives and negatives per HPI.  Past Medical History:  Diagnosis Date  . Asthma   . Depression    bipolar  . Fainting spell    H/O  . H/O cardiac arrhythmia   . Migraines   . Seizures ([place])     Past Surgical History:  Procedure Laterality Date  . TUBAL LIGATION Bilateral 09/2002    Family History  Problem Relation Age of Onset  . Hypertension Mother   . Diabetes Mother   . Arthritis Mother   . Heart disease Mother   . Breast cancer Neg Hx     SOCIAL HX: reviewed.    Current Outpatient Medications:  .  azithromycin (ZITHROMAX) 250 MG tablet, Take 2 tablets on day 1, then 1 tablet daily on days 2 through 5, Disp: 6 tablet, Rfl: 0 .  busPIRone (BUSPAR) 5 MG tablet, Take 1 tablet (5 mg total) by mouth 2 (two) times daily as needed., Disp: 60 tablet, Rfl: 1 .  albuterol (VENTOLIN HFA) 108 (90 Base) MCG/ACT inhaler, Inhale 1-2 puffs into the lungs every 4 (four) hours as needed for wheezing or shortness of breath., Disp: 18 g, Rfl: 11 .  cetirizine (ZYRTEC ALLERGY) 10 MG tablet, Take 1 tablet (10 mg total) by mouth daily., Disp: 30 tablet, Rfl: 0 .  famotidine (PEPCID) 20 MG tablet, Take 1 tablet (20 mg total) by mouth daily., Disp: 30 tablet, Rfl: 1 .  fluticasone (FLONASE) 50 MCG/ACT nasal spray, Place 1 spray into both nostrils daily., Disp: 16 g, Rfl: 0 .  Fluticasone-Umeclidin-Vilant (TRELEGY ELLIPTA) 200-62.5-25 MCG/INH AEPB, Inhale 1  puff into the lungs daily., Disp: 60 each, Rfl: 2 .  ipratropium-albuterol (DUONEB) 0.5-2.5 (3) MG/3ML SOLN, Inhale 3 mLs into the lungs every 4 (four) hours as needed., Disp: 360 mL, Rfl: 11 .  magnesium oxide (MAG-OX) 400 (240 Mg) MG tablet, Take 1 tablet (400 mg total) by mouth daily., Disp: 30 tablet, Rfl: 2 .  montelukast (SINGULAIR) 10 MG tablet, Take 1 tablet (10 mg total) by mouth at bedtime., Disp: 90 tablet, Rfl: 3 .  ondansetron (ZOFRAN-ODT) 4 MG disintegrating tablet, DISSOLVE 1 TABLET IN MOUTH TWICE DAILY  AS NEEDED FOR NAUSEA FOR VOMITING, Disp: 20 tablet, Rfl: 0 .  rizatriptan (MAXALT-MLT) 10 MG disintegrating tablet, TAKE 1 TABLET BY MOUTH ONCE AS NEEDED FOR ONSET OF MIGRAINE - MAY TAKE A SECOND DOSE AFTER 2 HOURS IF NEEDED, Disp: 10 tablet, Rfl: 0  EXAM:  GENERAL: alert, oriented, appears well and in no acute distress  HEENT: atraumatic, conjunttiva clear, no obvious abnormalities on inspection of external nose and ears  NECK: normal movements of the head and neck  LUNGS: on inspection no signs of respiratory distress, breathing rate appears normal, no obvious gross SOB, gasping or wheezing.    CV: no obvious cyanosis  PSYCH/NEURO: pleasant and cooperative, no obvious depression or anxiety, speech and thought processing grossly intact  ASSESSMENT AND PLAN:  Discussed the following assessment and plan:  Problem List Items Addressed This Visit    Abdominal pain    Persistent intermittent discomfort.  Discussed keeping bowels moving.  Acid reflux appears to be controlled.  Check abdominal ultrasound and f/u with GI as previously planned.       Anxiety    Increased anxiety as outlined.  Discussed treatment options.  She has appt with Dr Toy Care 04/06/21.  Feels needs something prior to that appt.  Will hold on valium.  Would prefer to use a different medication.  Discussed with her.  Trial of buspar.  Follow.       Relevant Medications   busPIRone (BUSPAR) 5 MG tablet   Asthma    Continue trelegy.  Has nebs if needed.  Treat sinus infection/URI.  States breathing ok.  Follow closely.  Check cxr.       Cough    Persistent congestion as outlined.  Saline nasal spray and flonase as directed.  Zpak. Check cxr.  Continue trelegy.  Has nebs.  Follow.        GERD (gastroesophageal reflux disease)    No increased acid reflux.  Have discussed pepcid.        History of COVID-19    Diagnosed 05/2020.  Received monoclonal antibody infusion.  Still with altered taste and smell.  Still with  increased fatigue.  Increased anxiety related.  Treat with buspar as outlined.  Treat current infection.  Follow.       Leukocytosis - Primary    Persistent elevation.  Off steroids.  Treat current infection.  Check cxr.  F/u cbc.       Nausea    Has altered sense of taste and smell.  Nausea as outlined.  Taking zofran.  Some abdominal discomfort.  Scheduled for abdominal ultrasound.  See if can schedule an earlier appt.  Also needs f/u with GI.  Was scheduled for EGD.  Postponed for covid.           Return if symptoms worsen or fail to improve, for keep scheduled.   I discussed the assessment and treatment plan with the patient. The patient was provided  an opportunity to ask questions and all were answered. The patient agreed with the plan and demonstrated an understanding of the instructions.   The patient was advised to call back or seek an in-person evaluation if the symptoms worsen or if the condition fails to improve as anticipated.   Einar Pheasant, MD

## 2021-02-27 NOTE — Assessment & Plan Note (Signed)
Persistent congestion as outlined.  Saline nasal spray and flonase as directed.  Zpak. Check cxr.  Continue trelegy.  Has nebs.  Follow.

## 2021-02-27 NOTE — Assessment & Plan Note (Signed)
Continue trelegy.  Has nebs if needed.  Treat sinus infection/URI.  States breathing ok.  Follow closely.  Check cxr.

## 2021-02-27 NOTE — Assessment & Plan Note (Signed)
No increased acid reflux.  Have discussed pepcid.

## 2021-02-27 NOTE — Assessment & Plan Note (Signed)
Increased anxiety as outlined.  Discussed treatment options.  She has appt with Dr Toy Care 04/06/21.  Feels needs something prior to that appt.  Will hold on valium.  Would prefer to use a different medication.  Discussed with her.  Trial of buspar.  Follow.

## 2021-02-27 NOTE — Assessment & Plan Note (Signed)
Diagnosed 05/2020.  Received monoclonal antibody infusion.  Still with altered taste and smell.  Still with increased fatigue.  Increased anxiety related.  Treat with buspar as outlined.  Treat current infection.  Follow.

## 2021-02-27 NOTE — Assessment & Plan Note (Signed)
Persistent elevation.  Off steroids.  Treat current infection.  Check cxr.  F/u cbc.

## 2021-03-23 ENCOUNTER — Encounter: Payer: Self-pay | Admitting: Internal Medicine

## 2021-03-26 ENCOUNTER — Other Ambulatory Visit: Payer: Self-pay | Admitting: Internal Medicine

## 2021-03-29 ENCOUNTER — Ambulatory Visit
Admission: RE | Admit: 2021-03-29 | Discharge: 2021-03-29 | Disposition: A | Discharge status: Home / Self Care | Payer: BC Managed Care – PPO | Source: Ambulatory Visit | Attending: Internal Medicine | Admitting: Internal Medicine

## 2021-03-29 ENCOUNTER — Other Ambulatory Visit: Payer: Self-pay

## 2021-03-29 DIAGNOSIS — R6881 Early satiety: Secondary | ICD-10-CM | POA: Insufficient documentation

## 2021-03-29 DIAGNOSIS — R748 Abnormal levels of other serum enzymes: Secondary | ICD-10-CM | POA: Diagnosis not present

## 2021-03-29 DIAGNOSIS — R110 Nausea: Secondary | ICD-10-CM | POA: Insufficient documentation

## 2021-04-06 DIAGNOSIS — F3111 Bipolar disorder, current episode manic without psychotic features, mild: Secondary | ICD-10-CM | POA: Diagnosis not present

## 2021-04-06 DIAGNOSIS — F3132 Bipolar disorder, current episode depressed, moderate: Secondary | ICD-10-CM | POA: Diagnosis not present

## 2021-04-10 ENCOUNTER — Other Ambulatory Visit: Payer: Self-pay | Admitting: Internal Medicine

## 2021-04-11 ENCOUNTER — Telehealth: Payer: Self-pay | Admitting: Gastroenterology

## 2021-04-11 ENCOUNTER — Ambulatory Visit: Payer: BC Managed Care – PPO | Admitting: Internal Medicine

## 2021-04-11 MED ORDER — ONDANSETRON 4 MG PO TBDP: 4.0000 mg | ORAL_TABLET | Freq: Three times a day (TID) | ORAL | 0 refills | Status: DC | PRN

## 2021-04-11 MED ORDER — RIZATRIPTAN BENZOATE 10 MG PO TBDP: ORAL_TABLET | ORAL | 0 refills | Status: DC

## 2021-04-11 NOTE — Telephone Encounter (Signed)
Heather [name] from Dr. Bary Leriche office wants a call back at [phone] to discuss mutual Patient. Clinical staff will follow up.

## 2021-04-12 NOTE — Telephone Encounter (Signed)
I spoke to [place] at [facility], she requested that we schedule pt appt... pt has been scheduled and Larena Glassman will let pt know of appt

## 2021-04-26 ENCOUNTER — Other Ambulatory Visit: Payer: Self-pay

## 2021-04-26 ENCOUNTER — Telehealth: Payer: Self-pay | Admitting: Internal Medicine

## 2021-04-26 ENCOUNTER — Ambulatory Visit: Payer: BC Managed Care – PPO | Admitting: Gastroenterology

## 2021-04-26 ENCOUNTER — Encounter: Payer: Self-pay | Admitting: Gastroenterology

## 2021-04-26 VITALS — BP 115/81 | HR 92 | Temp 98.2°F | Ht 66.5 in | Wt 170.0 lb

## 2021-04-26 DIAGNOSIS — R112 Nausea with vomiting, unspecified: Secondary | ICD-10-CM

## 2021-04-26 DIAGNOSIS — R109 Unspecified abdominal pain: Secondary | ICD-10-CM

## 2021-04-26 DIAGNOSIS — K5900 Constipation, unspecified: Secondary | ICD-10-CM | POA: Diagnosis not present

## 2021-04-26 DIAGNOSIS — D72829 Elevated white blood cell count, unspecified: Secondary | ICD-10-CM

## 2021-04-26 MED ORDER — PEG 3350-KCL-NA BICARB-NACL 420 G PO SOLR: 4000.0000 mL | Freq: Once | ORAL | 0 refills | Status: AC

## 2021-04-26 MED ORDER — LINACLOTIDE 145 MCG PO CAPS: 145.0000 ug | ORAL_CAPSULE | Freq: Every day | ORAL | 0 refills | Status: DC

## 2021-04-26 NOTE — Progress Notes (Addendum)
Heather Antigua, MD [address]  [place], [place] [address]  Main: [phone]  Fax: [phone]   Primary Care Physician: Einar Pheasant, MD   Chief Complaint  Patient presents with   New Patient (Initial Visit)   Nausea    Ongoing daily   Constipation    Pt reports intermittent problems with diarrhea... pt does take OTC Benefiber, milk of magnesia   Abdominal Pain    Worse when she is constipated, relief after BM...    HPI: Heather [name] is a 45 y.o. female previously seen in March 2020 for abdominal pain an EGD and colonoscopy were recommended at the time.  However, patient states due to [place] pandemic she did not have these procedures done.  Is now reporting that she has daily nausea, with intermittent vomiting about once a month, with no hematemesis.  Feels that her vomiting episodes occur when she is severely constipated.  She states her primary care provider recommended that she takes milk of magnesia when she gets too constipated.  And she takes this about once every 2 weeks and with this constipation is better but still continues to have hard stools or no bowel movements for days and then will have loose bowel movements after taking milk of magnesia.  Reports abdominal discomfort that is dull, 5/10, nonradiating, and associated with constipation, and improves after a bowel movement.  States does notice blood on toilet paper when she has to strain.  No family history of colon cancer.  She states she does have baseline anxiety but does not feel that her nausea or abdominal symptoms are related to this.  PCP note from June 2020 reviewed and they recommended taking Zofran for her nausea, scheduled her for abdominal ultrasound and GI follow-up.  They also discussed Pepcid for GERD.  Patient states she is only taking this as needed  Previous history:  Patient reports chronic history of intermittent abdominal pain, that starts in the left lower quadrant  region, and then moves to the entire abdomen depending on how severe it is.  States these episodes occur 1-2 times a week, but sometimes she can go months without an episode.  Describes the pain as cramping, nonradiating, 5/10.  Improves with liquid dicyclomine only, not the tablet dicyclomine.  Also improves after she has a bowel movement.  However, states she had a severe episode about 6 to 8 weeks ago and went to her primary care provider.  CT scan was done that was normal.  Patient states this episode was different as it did not improve after bowel movement, it did not improve after taking dicyclomine.  As at this time the pain is completely gone.  No nausea or vomiting.  No heartburn or dysphagia.  However, since the severe episodes have occurred 2 or 3 times since last 6 weeks she is concerned and is here to discuss endoscopic evaluation.   She states she had an upper endoscopy and colonoscopy 5 years ago and was told to have another one in 5 years.  We do not have these procedure reports but there is an [facility] colonoscopy listed under procedures in 2013.  Which states recommended follow-up 5 years.  The results are not available under provation either.   I was able to locate a 2010 colonoscopy and EGD report under her middle name listed as the last name and provation.  This was done by Dr. Gustavo Lah for abdominal pain.  Colonoscopy was reported to be normal and  biopsies were obtained.  EGD reports suggestion of esophageal dysmotility, gastric erythema and erosions, and suspicion for celiac disease.  The biopsy reports from these procedures are not available.  I suspect that the 2013 entry under procedures in her epic chart is actually in relation to her 2010 procedures as these are the only procedures that she has not done for GI.   ROS: All ROS reviewed and negative except as per HPI   Past Medical History:  Diagnosis Date   Asthma    Depression    bipolar   Fainting spell    H/O   H/O cardiac  arrhythmia    Migraines    Seizures ([place])     Past Surgical History:  Procedure Laterality Date   TUBAL LIGATION Bilateral 09/2002    Prior to Admission medications   Medication Sig Start Date End Date Taking? Authorizing Provider  albuterol (VENTOLIN HFA) 108 (90 Base) MCG/ACT inhaler Inhale 1-2 puffs into the lungs every 4 (four) hours as needed for wheezing or shortness of breath. 04/30/20  Yes McLean-Scocuzza, Nino Glow, MD  busPIRone (BUSPAR) 5 MG tablet Take 1 tablet (5 mg total) by mouth 2 (two) times daily as needed. 02/27/21  Yes Einar Pheasant, MD  cetirizine (ZYRTEC ALLERGY) 10 MG tablet Take 1 tablet (10 mg total) by mouth daily. 06/07/20  Yes Hall-Potvin, [place], PA-C  famotidine (PEPCID) 20 MG tablet Take 1 tablet (20 mg total) by mouth daily. 01/02/19  Yes Einar Pheasant, MD  fluticasone (FLONASE) 50 MCG/ACT nasal spray Place 1 spray into both nostrils daily. 06/07/20  Yes Hall-Potvin, [place], PA-C  Fluticasone-Umeclidin-Vilant (TRELEGY ELLIPTA) 200-62.5-25 MCG/INH AEPB Inhale 1 puff into the lungs daily. 11/26/20  Yes Olalere, Adewale A, MD  ipratropium-albuterol (DUONEB) 0.5-2.5 (3) MG/3ML SOLN Inhale 3 mLs into the lungs every 4 (four) hours as needed. 04/30/20  Yes McLean-Scocuzza, Nino Glow, MD  linaclotide [facility]) 145 MCG CAPS capsule Take 1 capsule (145 mcg total) by mouth daily before breakfast. 04/26/21  Yes Heather [name] B, MD  magnesium oxide (MAG-OX) 400 (240 Mg) MG tablet Take 1 tablet (400 mg total) by mouth daily. 02/04/21  Yes Einar Pheasant, MD  montelukast (SINGULAIR) 10 MG tablet Take 1 tablet (10 mg total) by mouth at bedtime. 04/30/20  Yes McLean-Scocuzza, Nino Glow, MD  ondansetron (ZOFRAN-ODT) 4 MG disintegrating tablet Take 1 tablet (4 mg total) by mouth every 8 (eight) hours as needed for nausea or vomiting. 04/11/21  Yes Einar Pheasant, MD  polyethylene glycol-electrolytes (NULYTELY) 420 g solution Take 4,000 mLs by mouth once for 1 dose. 04/26/21 04/26/21 Yes  Virgel Manifold, MD  rizatriptan (MAXALT-MLT) 10 MG disintegrating tablet TAKE ONE TABLET BY MOUTH ONCE AS NEEDED FOR ONSET OF MIGRAINE. MAY TAKE A SECOND DOSE AFTER TWO HOURS IF NEEDED 04/11/21  Yes Einar Pheasant, MD    Family History  Problem Relation Age of Onset   Hypertension Mother    Diabetes Mother    Arthritis Mother    Heart disease Mother    Breast cancer Neg Hx      Social History   Tobacco Use   Smoking status: Former    Packs/day: 0.50    Years: 5.00    Pack years: 2.50    Types: Cigarettes   Smokeless tobacco: Never   Tobacco comments:    Quit mid October 2014  Substance Use Topics   Alcohol use: No    Alcohol/week: 0.0 standard drinks   Drug use: No    Allergies  as of 04/26/2021 - Review Complete 04/26/2021  Allergen Reaction Noted   Amoxicillin Nausea And Vomiting 01/20/2019   Augmentin [amoxicillin-pot clavulanate]  02/20/2013   Erythromycin Nausea And Vomiting 06/25/2015   Levaquin [levofloxacin] Swelling 04/30/2020   Lithium Nausea Only 02/20/2013   Tramadol  02/20/2013    Physical Examination:  Constitutional: General:   Alert,  Well-developed, well-nourished, pleasant and cooperative in NAD BP 115/81   Pulse 92   Temp 98.2 F (36.8 C) (Oral)   Ht 5' 6.5" (1.689 m)   Wt 170 lb (77.1 kg)   BMI 27.03 kg/m   Respiratory: Normal respiratory effort  Gastrointestinal:  Soft, non-tender and non-distended without masses, hepatosplenomegaly or hernias noted.  No guarding or rebound tenderness.     Cardiac: No clubbing or edema.  No cyanosis. Normal posterior tibial pedal pulses noted.  Psych:  Alert and cooperative. Normal mood and affect.  Musculoskeletal:  Normal gait. Head normocephalic, atraumatic. Symmetrical without gross deformities. 5/5 Lower extremity strength bilaterally.  Skin: Warm. Intact without significant lesions or rashes. No jaundice.  Neck: Supple, trachea midline  Lymph: No cervical lymphadenopathy  Psych:   Alert and oriented x3, Alert and cooperative. Normal mood and affect.  Labs: CMP     Component Value Date/Time   NA 139 02/04/2021 0824   NA 138 06/19/2014 1437   K 3.8 02/04/2021 0824   K 3.5 06/19/2014 1437   CL 104 02/04/2021 0824   CL 107 06/19/2014 1437   CO2 24 02/04/2021 0824   CO2 25 06/19/2014 1437   GLUCOSE 92 02/04/2021 0824   GLUCOSE 105 (H) 06/19/2014 1437   BUN 9 02/04/2021 0824   BUN 5 (L) 06/19/2014 1437   CREATININE 0.78 02/04/2021 0824   CREATININE 0.89 06/19/2014 1437   CALCIUM 9.5 02/04/2021 0824   CALCIUM 8.2 (L) 06/19/2014 1437   PROT 6.5 02/18/2021 1005   PROT 7.1 06/19/2014 1437   ALBUMIN 4.4 02/18/2021 1005   ALBUMIN 3.5 06/19/2014 1437   AST 12 02/18/2021 1005   AST 32 06/19/2014 1437   ALT 12 02/18/2021 1005   ALT 49 06/19/2014 1437   ALKPHOS 123 (H) 02/18/2021 1005   ALKPHOS 169 (H) 06/19/2014 1437   BILITOT 0.4 02/18/2021 1005   BILITOT 0.4 06/19/2014 1437   GFRNONAA >60 02/02/2016 1641   GFRNONAA >60 06/19/2014 1437   GFRNONAA >60 04/23/2013 0950   GFRAA >60 02/02/2016 1641   GFRAA >60 06/19/2014 1437   GFRAA >60 04/23/2013 0950   Lab Results  Component Value Date   WBC 18.7 cH (HH) 02/18/2021   HGB 14.1 02/18/2021   HCT 41.1 02/18/2021   MCV 83.7 02/18/2021   PLT 283.0 02/18/2021    Imaging Studies: Abdominal ultrasound July 22 normal  Assessment and Plan:   Heather [name] is a 45 y.o. y/o female here for evaluation of nausea, abdominal pain, constipation  Patient's constipation seems to be playing into her nausea symptoms as well as described in HPI and as per her report to [place]  She has tried MiraLAX and other fiber products over-the-counter and this has not led to relief of symptoms.  Therefore, will start pharmacologic therapy.  Linzess, Trulance, Amitiza are listed as nonformulary drugs in epic for her insurance.  Motegrity is formulary but has almost $500 per cost associated for 30-day supply  We will give her Linzess  samples and send it to the pharmacy to confirm if it is covered and we may need to proceed with prior authorization  for medications for her  She will need screening colonoscopy at this time as well  Given her ongoing nausea and intermittent vomiting with abdominal pain, and previous plans for EGD, would recommend EGD also  However, her white count has been elevated.  PCP notes in this regard reviewed from June 2022 and they note that she has had persistent elevation and recommended staying off steroids, checking for infection.  We will send medical clearance request to PCP for her procedures given the leukocytosis.  She has had previous elevation in the past, but it has worsened recently  She was also referred to [place] for elevated alk phos.  However, GGT is normal and alk phos elevation is unlikely to be coming from the liver.  Other etiologies such as bone or medications to be worked up by PCP  I have discussed alternative options, risks & benefits,  which include, but are not limited to, bleeding, infection, perforation,respiratory complication & drug reaction.  The patient agrees with this plan & written consent will be obtained.    Abdominal ultrasound findings reassuring.  CBC CMP otherwise reassuring except for alk phos which is not from Liver etiology as mentioned above  Dr Heather [name]

## 2021-04-26 NOTE — Telephone Encounter (Signed)
Reviewed recent GI note.  Needs f/u cbc and liver panel within the next week.  Please schedule non fasting lab.  Thanks.

## 2021-04-27 ENCOUNTER — Telehealth: Payer: Self-pay

## 2021-04-27 NOTE — Telephone Encounter (Signed)
Medical clearance request faxed to PCP

## 2021-04-27 NOTE — Telephone Encounter (Signed)
Reviewed recent GI note.  Needs f/u cbc and liver panel within the next week.  Please schedule non fasting lab.  Thanks.

## 2021-04-27 NOTE — Addendum Note (Signed)
Addended by: Lurlean Nanny on: 04/27/2021 08:21 AM   Modules accepted: Orders, SmartSet

## 2021-04-28 NOTE — Telephone Encounter (Signed)
LMTCB

## 2021-05-02 NOTE — Telephone Encounter (Signed)
LMTCB. Needs non fasting lab appt beginning of this week. Orders are in

## 2021-05-04 ENCOUNTER — Encounter: Payer: Self-pay | Admitting: Internal Medicine

## 2021-05-04 ENCOUNTER — Other Ambulatory Visit: Payer: Self-pay | Admitting: Pulmonary Disease

## 2021-05-05 NOTE — Telephone Encounter (Signed)
LMTCB

## 2021-05-08 ENCOUNTER — Other Ambulatory Visit: Payer: Self-pay | Admitting: Internal Medicine

## 2021-05-08 DIAGNOSIS — J4541 Moderate persistent asthma with (acute) exacerbation: Secondary | ICD-10-CM

## 2021-05-09 ENCOUNTER — Other Ambulatory Visit: Payer: Self-pay

## 2021-05-09 DIAGNOSIS — J4541 Moderate persistent asthma with (acute) exacerbation: Secondary | ICD-10-CM

## 2021-05-09 MED ORDER — ALBUTEROL SULFATE HFA 108 (90 BASE) MCG/ACT IN AERS: 1.0000 | INHALATION_SPRAY | RESPIRATORY_TRACT | 11 refills | Status: DC | PRN

## 2021-05-09 MED ORDER — IPRATROPIUM-ALBUTEROL 0.5-2.5 (3) MG/3ML IN SOLN: 3.0000 mL | RESPIRATORY_TRACT | 11 refills | Status: DC | PRN

## 2021-05-09 NOTE — Telephone Encounter (Signed)
Confirmed nothing acute going on with patient. She is needing a refill on her albuterol inhaler and neb treatments to have on hand to use prn. OK to refill?

## 2021-05-09 NOTE — Telephone Encounter (Signed)
Yes

## 2021-05-09 NOTE — Telephone Encounter (Signed)
Medication refilled

## 2021-05-09 NOTE — Telephone Encounter (Signed)
Patient scheduled for labs- could not come in this week due to being on vacation. Will have labs done when she returns 08/22. Also scheduled for pre-op for colonoscopy. Pre-op form filed for appt.

## 2021-05-16 ENCOUNTER — Other Ambulatory Visit: Payer: BC Managed Care – PPO

## 2021-05-18 ENCOUNTER — Other Ambulatory Visit: Payer: Self-pay

## 2021-05-18 ENCOUNTER — Ambulatory Visit: Payer: BC Managed Care – PPO | Admitting: Internal Medicine

## 2021-05-18 VITALS — BP 136/80 | HR 90 | Temp 97.9°F | Resp 16 | Ht 67.0 in | Wt 178.2 lb

## 2021-05-18 DIAGNOSIS — E7800 Pure hypercholesterolemia, unspecified: Secondary | ICD-10-CM

## 2021-05-18 DIAGNOSIS — J4541 Moderate persistent asthma with (acute) exacerbation: Secondary | ICD-10-CM | POA: Diagnosis not present

## 2021-05-18 DIAGNOSIS — Z01818 Encounter for other preprocedural examination: Secondary | ICD-10-CM

## 2021-05-18 DIAGNOSIS — F3131 Bipolar disorder, current episode depressed, mild: Secondary | ICD-10-CM | POA: Diagnosis not present

## 2021-05-18 DIAGNOSIS — D72829 Elevated white blood cell count, unspecified: Secondary | ICD-10-CM | POA: Diagnosis not present

## 2021-05-18 DIAGNOSIS — F410 Panic disorder [episodic paroxysmal anxiety] without agoraphobia: Secondary | ICD-10-CM | POA: Diagnosis not present

## 2021-05-18 DIAGNOSIS — K219 Gastro-esophageal reflux disease without esophagitis: Secondary | ICD-10-CM

## 2021-05-18 DIAGNOSIS — F3174 Bipolar disorder, in full remission, most recent episode manic: Secondary | ICD-10-CM | POA: Diagnosis not present

## 2021-05-18 LAB — CBC WITH DIFFERENTIAL/PLATELET
Basophils Absolute: 0.1 10*3/uL (ref 0.0–0.1)
Basophils Relative: 0.9 % (ref 0.0–3.0)
Eosinophils Absolute: 0.2 10*3/uL (ref 0.0–0.7)
Eosinophils Relative: 2 % (ref 0.0–5.0)
HCT: 39.6 % (ref 36.0–46.0)
Hemoglobin: 13 g/dL (ref 12.0–15.0)
Lymphocytes Relative: 10.1 % — ABNORMAL LOW (ref 12.0–46.0)
Lymphs Abs: 1.1 10*3/uL (ref 0.7–4.0)
MCHC: 32.7 g/dL (ref 30.0–36.0)
MCV: 87.6 fl (ref 78.0–100.0)
Monocytes Absolute: 0.7 10*3/uL (ref 0.1–1.0)
Monocytes Relative: 6.7 % (ref 3.0–12.0)
Neutro Abs: 8.8 10*3/uL — ABNORMAL HIGH (ref 1.4–7.7)
Neutrophils Relative %: 80.3 % — ABNORMAL HIGH (ref 43.0–77.0)
Platelets: 231 10*3/uL (ref 150.0–400.0)
RBC: 4.53 Mil/uL (ref 3.87–5.11)
RDW: 14.3 % (ref 11.5–15.5)
WBC: 10.9 10*3/uL — ABNORMAL HIGH (ref 4.0–10.5)

## 2021-05-18 LAB — HEPATIC FUNCTION PANEL
ALT: 19 U/L (ref 0–35)
AST: 15 U/L (ref 0–37)
Albumin: 4 g/dL (ref 3.5–5.2)
Alkaline Phosphatase: 122 U/L — ABNORMAL HIGH (ref 39–117)
Bilirubin, Direct: 0.1 mg/dL (ref 0.0–0.3)
Total Bilirubin: 0.4 mg/dL (ref 0.2–1.2)
Total Protein: 6.2 g/dL (ref 6.0–8.3)

## 2021-05-18 LAB — BASIC METABOLIC PANEL
BUN: 8 mg/dL (ref 6–23)
CO2: 23 mEq/L (ref 19–32)
Calcium: 8.8 mg/dL (ref 8.4–10.5)
Chloride: 105 mEq/L (ref 96–112)
Creatinine, Ser: 0.83 mg/dL (ref 0.40–1.20)
GFR: 84.79 mL/min (ref >60.00)
Glucose, Bld: 75 mg/dL (ref 70–99)
Potassium: 4.1 mEq/L (ref 3.5–5.1)
Sodium: 136 mEq/L (ref 135–145)

## 2021-05-18 LAB — LIPID PANEL
Cholesterol: 170 mg/dL (ref 0–200)
HDL: 39.1 mg/dL (ref >39.00)
LDL Cholesterol: 110 mg/dL — ABNORMAL HIGH (ref 0–99)
NonHDL: 131.35
Total CHOL/HDL Ratio: 4
Triglycerides: 105 mg/dL (ref 0.0–149.0)
VLDL: 21 mg/dL (ref 0.0–40.0)

## 2021-05-18 MED ORDER — PREDNISONE 10 MG PO TABS: ORAL_TABLET | ORAL | 0 refills | Status: DC

## 2021-05-18 NOTE — Progress Notes (Signed)
Subjective:    Patient ID: Heather [name], female    DOB: [birth year], 45 y.o.   MRN: [id number]  HPI  This visit occurred during the SARS-CoV-2 public health emergency.  Safety protocols were in place, including screening questions prior to the visit, additional usage of staff PPE, and extensive cleaning of exam room while observing appropriate contact time as indicated for disinfecting solutions.   Patient here for pre op evaluation.  Chief Complaint  Patient presents with   Pre-op Exam   .   Scheduled for colonoscopy on 06/03/21. GI requested PCP clearance due to leukocytosis.  She has a history of asthma/bronchitis.  Intermittent episodes of congestion/cough.  Went to [employer] recently.  Started having symptoms last Sunday (approximately 10 days ago).  Started with throat drainage.  Increased sinus pressure.  Thick mucus production at times.  Clear.  Right side - more affected.  Took mucinex yesterday.  On flonase.  Taking singulair.  Increased fatigue.  Some nausea.  Using trelegy.  Occasional acid reflux.  Takes zofran 2-3x/week.  No vomiting.  Using nebs prn - recently started using in am and pm.  No fever.    Past Medical History:  Diagnosis Date   Asthma    Depression    bipolar   Fainting spell    H/O   H/O cardiac arrhythmia    Migraines    Seizures ([facility])    Past Surgical History:  Procedure Laterality Date   TUBAL LIGATION Bilateral 09/2002   Family History  Problem Relation Age of Onset   Hypertension Mother    Diabetes Mother    Arthritis Mother    Heart disease Mother    Breast cancer Neg Hx    Social History   Socioeconomic History   Marital status: Married    Spouse name: Not on file   Number of children: Not on file   Years of education: Not on file   Highest education level: Not on file  Occupational History   Not on file  Tobacco Use   Smoking status: Former    Packs/day: 0.50    Years: 5.00    Pack years: 2.50    Types: Cigarettes   Smokeless  tobacco: Never   Tobacco comments:    Quit mid October 2014  Substance and Sexual Activity   Alcohol use: No    Alcohol/week: 0.0 standard drinks   Drug use: No   Sexual activity: Not on file  Other Topics Concern   Not on file  Social History Narrative   Not on file   Social Determinants of Health   Financial Resource Strain: Not on file  Food Insecurity: Not on file  Transportation Needs: Not on file  Physical Activity: Not on file  Stress: Not on file  Social Connections: Not on file     Review of Systems  Constitutional:  Negative for appetite change, fever and unexpected weight change.  HENT:  Positive for congestion and postnasal drip.   Respiratory:  Positive for cough. Negative for chest tightness and shortness of breath.   Cardiovascular:  Negative for chest pain and palpitations.  Gastrointestinal:  Positive for nausea. Negative for abdominal pain, diarrhea and vomiting.  Genitourinary:  Negative for difficulty urinating and dysuria.  Musculoskeletal:  Negative for joint swelling and myalgias.  Skin:  Negative for color change and rash.  Neurological:  Negative for dizziness, light-headedness and headaches.  Psychiatric/Behavioral:  Negative for agitation and dysphoric mood.  Objective:    Physical Exam Vitals reviewed.  Constitutional:      General: She is not in acute distress.    Appearance: Normal appearance.  HENT:     Head: Normocephalic and atraumatic.     Right Ear: External ear normal.     Left Ear: External ear normal.  Eyes:     General: No scleral icterus.       Right eye: No discharge.        Left eye: No discharge.     Conjunctiva/sclera: Conjunctivae normal.  Neck:     Thyroid: No thyromegaly.  Cardiovascular:     Rate and Rhythm: Normal rate and regular rhythm.  Pulmonary:     Effort: No respiratory distress.     Breath sounds: Normal breath sounds. No wheezing.  Abdominal:     General: Bowel sounds are normal.      Palpations: Abdomen is soft.     Tenderness: There is no abdominal tenderness.  Musculoskeletal:        General: No swelling or tenderness.     Cervical back: Neck supple. No tenderness.  Lymphadenopathy:     Cervical: No cervical adenopathy.  Skin:    Findings: No erythema or rash.  Neurological:     Mental Status: [name].  Psychiatric:        Mood and Affect: Mood normal.        Behavior: Behavior normal.    BP 136/80   Pulse 90   Temp 97.9 F (36.6 C)   Resp 16   Ht '5\' 7"'$  (1.702 m)   Wt 178 lb 3.2 oz (80.8 kg)   LMP 05/03/2021   SpO2 99%   BMI 27.91 kg/m  Wt Readings from Last 3 Encounters:  05/18/21 178 lb 3.2 oz (80.8 kg)  04/26/21 170 lb (77.1 kg)  02/25/21 169 lb (76.7 kg)    Outpatient Encounter Medications as of 05/18/2021  Medication Sig   predniSONE (DELTASONE) 10 MG tablet Take 6 tablets x 1 day and then decrease by 1/2 tablet per day until down to zero mg.   albuterol (VENTOLIN HFA) 108 (90 Base) MCG/ACT inhaler Inhale 1-2 puffs into the lungs every 4 (four) hours as needed for wheezing or shortness of breath.   busPIRone (BUSPAR) 5 MG tablet Take 1 tablet (5 mg total) by mouth 2 (two) times daily as needed.   cetirizine (ZYRTEC ALLERGY) 10 MG tablet Take 1 tablet (10 mg total) by mouth daily.   diazepam (VALIUM) 10 MG tablet Take 10 mg by mouth 4 (four) times daily as needed.   famotidine (PEPCID) 20 MG tablet Take 1 tablet (20 mg total) by mouth daily.   fluticasone (FLONASE) 50 MCG/ACT nasal spray Place 1 spray into both nostrils daily.   ipratropium-albuterol (DUONEB) 0.5-2.5 (3) MG/3ML SOLN Inhale 3 mLs into the lungs every 4 (four) hours as needed.   linaclotide (LINZESS) 145 MCG CAPS capsule Take 1 capsule (145 mcg total) by mouth daily before breakfast.   magnesium oxide (MAG-OX) 400 (240 Mg) MG tablet Take 1 tablet (400 mg total) by mouth daily.   montelukast (SINGULAIR) 10 MG tablet Take 1 tablet (10 mg total) by mouth at bedtime.    ondansetron (ZOFRAN-ODT) 4 MG disintegrating tablet Take 1 tablet (4 mg total) by mouth every 8 (eight) hours as needed for nausea or vomiting.   QUEtiapine (SEROQUEL XR) 300 MG 24 hr tablet Take 600 mg by mouth at bedtime.   rizatriptan (MAXALT-MLT) 10  MG disintegrating tablet TAKE ONE TABLET BY MOUTH ONCE AS NEEDED FOR ONSET OF MIGRAINE. MAY TAKE A SECOND DOSE AFTER TWO HOURS IF NEEDED   TRELEGY ELLIPTA 200-62.5-25 MCG/INH AEPB INHALE 1 PUFF ONCE DAILY   No facility-administered encounter medications on file as of 05/18/2021.     Lab Results  Component Value Date   WBC 10.9 (H) 05/18/2021   HGB 13.0 05/18/2021   HCT 39.6 05/18/2021   PLT 231.0 05/18/2021   GLUCOSE 75 05/18/2021   CHOL 170 05/18/2021   TRIG 105.0 05/18/2021   HDL 39.10 05/18/2021   LDLDIRECT 127.0 09/29/2020   LDLCALC 110 (H) 05/18/2021   ALT 19 05/18/2021   AST 15 05/18/2021   NA 136 05/18/2021   K 4.1 05/18/2021   CL 105 05/18/2021   CREATININE 0.83 05/18/2021   BUN 8 05/18/2021   CO2 23 05/18/2021   TSH 2.07 02/04/2021    US Abdomen Complete  Result Date: 03/29/2021 CLINICAL DATA:  Elevated alkaline phosphatase, early satiety, nausea EXAM: ABDOMEN ULTRASOUND COMPLETE COMPARISON:  03/26/2008 FINDINGS: Gallbladder: Normally distended without stones or wall thickening. No pericholecystic fluid or sonographic Murphy sign. Common bile duct: Diameter: 3 mm, normal Liver: Normal echogenicity without mass or nodularity. Portal vein is patent on color Doppler imaging with normal direction of blood flow towards the liver. IVC: Normal appearance Pancreas: Normal appearance Spleen: Normal appearance, 11.0 cm length Right Kidney: Length: 10.1 cm. Normal morphology without mass or hydronephrosis. Left Kidney: Length: 10.2 cm. Normal morphology without mass or hydronephrosis. Abdominal aorta: Normal caliber Other findings: No free fluid IMPRESSION: Normal exam. Electronically Signed   By: Lavonia Dana M.D.   On: 03/29/2021 08:32        Assessment & Plan:   Problem List Items Addressed This Visit     Asthma    Has a history of asthma.  Recent increased congestion, drainage and sore throat.  Occasional acid reflux.  Continue mucinex.  flonase nasal spray and singulair.  Continue trelegy.  Has rescue inhaler and nebs if needed.  Prednisone taper as directed.  Follow closely.  Call with update.  Needs to be cleared prior to procedure.  Also, would like pulmonary evaluation prior to procedure.       Relevant Medications   predniSONE (DELTASONE) 10 MG tablet   GERD (gastroesophageal reflux disease)    Consider pepcid.        Hypercholesterolemia - Primary    Low cholesterol diet and exercise.  Follow lipid panel.       Relevant Orders   Lipid panel (Completed)   Leukocytosis    Recent elevated wbc count.  Recheck cbc to confirm improved.        Pre-op evaluation    No chest pain or sob with increased activity or exertion.  Breathing issues related to her asthma and underlying congestion.  Treat as outlined.  Discussed need for clearance of current active symptoms and f/u with pulmonary prior to procedure.         The entirety of the information documented in the History of Present Illness, Review of Systems and Physical Exam were personally obtained by me. Portions of this information were initially documented by the CMA and reviewed by me for thoroughness and accuracy.   Einar Pheasant, MD  Einar Pheasant, MD

## 2021-05-20 ENCOUNTER — Inpatient Hospital Stay
Admission: RE | Admit: 2021-05-20 | Discharge: 2021-05-20 | Disposition: A | Discharge status: Home / Self Care | Payer: Self-pay | Source: Ambulatory Visit

## 2021-05-20 ENCOUNTER — Other Ambulatory Visit: Payer: Self-pay

## 2021-05-20 ENCOUNTER — Ambulatory Visit
Admission: EM | Admit: 2021-05-20 | Discharge: 2021-05-20 | Disposition: A | Discharge status: Home / Self Care | Payer: BC Managed Care – PPO | Attending: Emergency Medicine | Admitting: Emergency Medicine

## 2021-05-20 DIAGNOSIS — Z113 Encounter for screening for infections with a predominantly sexual mode of transmission: Secondary | ICD-10-CM

## 2021-05-20 DIAGNOSIS — Z202 Contact with and (suspected) exposure to infections with a predominantly sexual mode of transmission: Secondary | ICD-10-CM | POA: Diagnosis not present

## 2021-05-20 MED ORDER — METRONIDAZOLE 500 MG PO TABS: 500.0000 mg | ORAL_TABLET | Freq: Two times a day (BID) | ORAL | 0 refills | Status: DC

## 2021-05-20 NOTE — ED Triage Notes (Signed)
Patient presents to Urgent Care with complaints of STD testing. She states multiple sexual partners in June/July. She states she was notified yesterday that her partner tested positive for trich. Denies any symptoms at this time.

## 2021-05-20 NOTE — ED Provider Notes (Signed)
[facility]    CSN: [id number] Arrival date & time: 05/20/21  1029      History   Chief Complaint Chief Complaint  Patient presents with   SEXUALLY TRANSMITTED DISEASE     HPI Heather [name] is a 45 y.o. female.  Patient presents with exposure to trichomoniasis.  She states she was notified by her sexual partner that his previous partner is positive for trichomoniasis.  She denies any symptoms, including vaginal discharge, pelvic pain, rash, lesions, abdominal pain, dysuria.  No treatments attempted.  Her medical history includes asthma, seizures, migraines.  The history is provided by the patient and medical records.   Past Medical History:  Diagnosis Date   Asthma    Depression    bipolar   Fainting spell    H/O   H/O cardiac arrhythmia    Migraines    Seizures ([place])     Patient Active Problem List   Diagnosis Date Noted   Anxiety 02/27/2021   Cough 02/25/2021   Vitamin D deficiency 02/04/2021   Fatigue 09/29/2020   Nausea 09/12/2020   Encounter for completion of form with patient 07/18/2020   History of COVID-19 06/11/2020   Bronchitis 09/22/2019   Herpes zoster without complication 0000000   GERD (gastroesophageal reflux disease) 01/05/2019   Back pain 12/21/2018   Leukocytosis 11/21/2018   Abdominal pain 11/19/2018   Conjunctivitis 05/23/2018   Sleeping difficulty 10/31/2017   Rash 06/11/2017   Health care maintenance 10/12/2016   History of miscarriage 10/12/2016   Hypercholesterolemia 07/09/2016   Migraine 07/20/2013   Seizure disorder ([place]) 07/20/2013   Asthma 07/20/2013   Alternating constipation and diarrhea 07/20/2013    Past Surgical History:  Procedure Laterality Date   TUBAL LIGATION Bilateral 09/2002    OB History   No obstetric history on file.      Home Medications    Prior to Admission medications   Medication Sig Start Date End Date Taking? Authorizing Provider  metroNIDAZOLE (FLAGYL) 500 MG tablet Take 1  tablet (500 mg total) by mouth 2 (two) times daily. 05/20/21  Yes Sharion Balloon, NP  albuterol (VENTOLIN HFA) 108 (90 Base) MCG/ACT inhaler Inhale 1-2 puffs into the lungs every 4 (four) hours as needed for wheezing or shortness of breath. 05/09/21   Einar Pheasant, MD  busPIRone (BUSPAR) 5 MG tablet Take 1 tablet (5 mg total) by mouth 2 (two) times daily as needed. 02/27/21   Einar Pheasant, MD  cetirizine (ZYRTEC ALLERGY) 10 MG tablet Take 1 tablet (10 mg total) by mouth daily. 06/07/20   Hall-Potvin, [place], PA-C  diazepam (VALIUM) 10 MG tablet Take 10 mg by mouth 4 (four) times daily as needed. 05/07/21   [provider]  famotidine (PEPCID) 20 MG tablet Take 1 tablet (20 mg total) by mouth daily. 01/02/19   Einar Pheasant, MD  fluticasone (FLONASE) 50 MCG/ACT nasal spray Place 1 spray into both nostrils daily. 06/07/20   Hall-Potvin, [place], PA-C  ipratropium-albuterol (DUONEB) 0.5-2.5 (3) MG/3ML SOLN Inhale 3 mLs into the lungs every 4 (four) hours as needed. 05/09/21   Einar Pheasant, MD  linaclotide [facility]) 145 MCG CAPS capsule Take 1 capsule (145 mcg total) by mouth daily before breakfast. 04/26/21   Vonda Antigua B, MD  magnesium oxide (MAG-OX) 400 (240 Mg) MG tablet Take 1 tablet (400 mg total) by mouth daily. 02/04/21   Einar Pheasant, MD  montelukast (SINGULAIR) 10 MG tablet Take 1 tablet (10 mg total) by mouth at bedtime.  04/30/20   McLean-Scocuzza, Nino Glow, MD  ondansetron (ZOFRAN-ODT) 4 MG disintegrating tablet Take 1 tablet (4 mg total) by mouth every 8 (eight) hours as needed for nausea or vomiting. 04/11/21   Einar Pheasant, MD  predniSONE (DELTASONE) 10 MG tablet Take 6 tablets x 1 day and then decrease by 1/2 tablet per day until down to zero mg. 05/18/21   Einar Pheasant, MD  QUEtiapine (SEROQUEL XR) 300 MG 24 hr tablet Take 600 mg by mouth at bedtime. 05/04/21   [provider]  rizatriptan (MAXALT-MLT) 10 MG disintegrating tablet TAKE ONE TABLET BY MOUTH ONCE  AS NEEDED FOR ONSET OF MIGRAINE. MAY TAKE A SECOND DOSE AFTER TWO HOURS IF NEEDED 04/11/21   Einar Pheasant, MD  Viviana Simpler ELLIPTA 200-62.5-25 MCG/INH AEPB INHALE 1 PUFF ONCE DAILY 05/04/21   Laurin Coder, MD    Family History Family History  Problem Relation Age of Onset   Hypertension Mother    Diabetes Mother    Arthritis Mother    Heart disease Mother    Breast cancer Neg Hx     Social History Social History   Tobacco Use   Smoking status: Former    Packs/day: 0.50    Years: 5.00    Pack years: 2.50    Types: Cigarettes   Smokeless tobacco: Never   Tobacco comments:    Quit mid October 2014  Substance Use Topics   Alcohol use: No    Alcohol/week: 0.0 standard drinks   Drug use: No     Allergies   Amoxicillin, Augmentin [amoxicillin-pot clavulanate], Erythromycin, Levaquin [levofloxacin], Lithium, and Tramadol   Review of Systems Review of Systems  Constitutional:  Negative for chills and fever.  Respiratory:  Negative for cough and shortness of breath.   Cardiovascular:  Negative for chest pain and palpitations.  Gastrointestinal:  Negative for abdominal pain and vomiting.  Genitourinary:  Negative for dysuria, flank pain, hematuria, pelvic pain and vaginal discharge.  Skin:  Negative for color change and rash.  All other systems reviewed and are negative.   Physical Exam Triage Vital Signs ED Triage Vitals  Enc Vitals Group     BP      Pulse      Resp      Temp      Temp src      SpO2      Weight      Height      Head Circumference      Peak Flow      Pain Score      Pain Loc      Pain Edu?      Excl. in [place]?    No data found.  Updated Vital Signs BP 132/89 (BP Location: Left Arm)   Pulse 99   Temp 98.1 F (36.7 C) (Temporal)   Resp 16   LMP 05/03/2021   SpO2 97%   Visual Acuity Right Eye Distance:   Left Eye Distance:   Bilateral Distance:    Right Eye Near:   Left Eye Near:    Bilateral Near:     Physical Exam Vitals and  nursing note reviewed.  Constitutional:      General: She is not in acute distress.    Appearance: She is well-developed. She is not ill-appearing.  HENT:     Head: Normocephalic and atraumatic.     Mouth/Throat:     Mouth: Mucous membranes are moist.  Eyes:     Conjunctiva/sclera: Conjunctivae normal.  Cardiovascular:     Rate and Rhythm: Normal rate and regular rhythm.     Heart sounds: Normal heart sounds.  Pulmonary:     Effort: Pulmonary effort is normal. No respiratory distress.     Breath sounds: Normal breath sounds.  Abdominal:     Palpations: Abdomen is soft.     Tenderness: There is no abdominal tenderness. There is no right CVA tenderness, left CVA tenderness, guarding or rebound.  Musculoskeletal:     Cervical back: Neck supple.  Skin:    General: Skin is warm and dry.  Neurological:     General: No focal deficit present.     Mental Status: [name] and oriented to person, place, and time.     Gait: Gait normal.  Psychiatric:        Mood and Affect: Mood normal.        Behavior: Behavior normal.     UC Treatments / Results  Labs (all labs ordered are listed, but only abnormal results are displayed) Labs Reviewed  CERVICOVAGINAL ANCILLARY ONLY    EKG   Radiology No results found.  Procedures Procedures (including critical care time)  Medications Ordered in UC Medications - No data to display  Initial Impression / Assessment and Plan / UC Course  I have reviewed the triage vital signs and the nursing notes.  Pertinent labs & imaging results that were available during my care of the patient were reviewed by me and considered in my medical decision making (see chart for details).  Exposure to STD, screening for STD.  Patient obtained vaginal self swab for testing.  Treating with metronidazole.  Discussed with patient that she may require additional treatment if her test results are positive.  Discussed that her sexual partner may also require  treatment.  Instructed her to abstain from sexual activity for at least 7 days.  Instructed her to follow-up with her PCP or OB/GYN if her symptoms are not improving.  Patient agrees to plan of care.    Final Clinical Impressions(s) / UC Diagnoses   Final diagnoses:  Exposure to STD  Screen for STD (sexually transmitted disease)     Discharge Instructions      Take metronidazole twice a day for 7 days.    Your vaginal tests are pending.  If your test results are positive, we will call you.  You and your sexual partner(s) may require treatment at that time.  Do not have sexual activity for at least 7 days.    Follow up with your primary care provider as needed.         ED Prescriptions     Medication Sig Dispense Auth. Provider   metroNIDAZOLE (FLAGYL) 500 MG tablet Take 1 tablet (500 mg total) by mouth 2 (two) times daily. 14 tablet Sharion Balloon, NP      PDMP not reviewed this encounter.   Sharion Balloon, NP 05/20/21 1109

## 2021-05-20 NOTE — Discharge Instructions (Addendum)
Take metronidazole twice a day for 7 days.    Your vaginal tests are pending.  If your test results are positive, we will call you.  You and your sexual partner(s) may require treatment at that time.  Do not have sexual activity for at least 7 days.    Follow up with your primary care provider as needed.

## 2021-05-23 LAB — CERVICOVAGINAL ANCILLARY ONLY
Bacterial Vaginitis (gardnerella): NEGATIVE
Candida Glabrata: NEGATIVE
Candida Vaginitis: POSITIVE — AB
Chlamydia: NEGATIVE
Comment: NEGATIVE
Comment: NEGATIVE
Comment: NEGATIVE
Comment: NEGATIVE
Comment: NEGATIVE
Comment: NORMAL
Neisseria Gonorrhea: NEGATIVE
Trichomonas: NEGATIVE

## 2021-05-24 ENCOUNTER — Encounter: Payer: Self-pay | Admitting: Internal Medicine

## 2021-05-24 DIAGNOSIS — Z01818 Encounter for other preprocedural examination: Secondary | ICD-10-CM | POA: Insufficient documentation

## 2021-05-24 MED ORDER — AZITHROMYCIN 250 MG PO TABS: ORAL_TABLET | ORAL | 0 refills | Status: AC

## 2021-05-24 NOTE — Assessment & Plan Note (Signed)
Recent elevated wbc count.  Recheck cbc to confirm improved.

## 2021-05-24 NOTE — Assessment & Plan Note (Signed)
Consider pepcid.

## 2021-05-24 NOTE — Assessment & Plan Note (Signed)
Has a history of asthma.  Recent increased congestion, drainage and sore throat.  Occasional acid reflux.  Continue mucinex.  flonase nasal spray and singulair.  Continue trelegy.  Has rescue inhaler and nebs if needed.  Prednisone taper as directed.  Follow closely.  Call with update.  Needs to be cleared prior to procedure.  Also, would like pulmonary evaluation prior to procedure.

## 2021-05-24 NOTE — Assessment & Plan Note (Signed)
No chest pain or sob with increased activity or exertion.  Breathing issues related to her asthma and underlying congestion.  Treat as outlined.  Discussed need for clearance of current active symptoms and f/u with pulmonary prior to procedure.

## 2021-05-24 NOTE — Telephone Encounter (Signed)
Are you ok with sending in abx for her since you just saw her?

## 2021-05-24 NOTE — Telephone Encounter (Signed)
I saw Heather [name] for pre op evaluation.  She was placed on prednisone.  Also, just gave her a prescription for zpak.  I had informed her that I would like for her to see pulmonary prior to her procedures for their recommendation and this acute infection needed to be clear.  Please confirm she is doing ok and let her know that I would like f/u scheduled with pulmonary prior to procedure.  Please call and schedule appt.  Thanks.

## 2021-05-24 NOTE — Assessment & Plan Note (Signed)
Low cholesterol diet and exercise.  Follow lipid panel.   

## 2021-05-25 ENCOUNTER — Telehealth (HOSPITAL_COMMUNITY): Payer: Self-pay | Admitting: Emergency Medicine

## 2021-05-25 MED ORDER — FLUCONAZOLE 150 MG PO TABS: 150.0000 mg | ORAL_TABLET | Freq: Once | ORAL | 0 refills | Status: AC

## 2021-05-26 ENCOUNTER — Telehealth: Payer: Self-pay

## 2021-05-26 NOTE — Telephone Encounter (Signed)
Received call from Garnette Scheuermann, LPN. Surgical clearance is needed prior to scheduled colonoscopy on 06/03/21. Patient currently being treated with zpak for URI. Last seen 08/31/2021. Appt scheduled for 05/27/2021 at 2:30. VM left for patient. Larena Glassman will attempt to notify patient of appt and fax clearance form to [place] office. Fax number provided.  Nothing further needed.

## 2021-05-27 ENCOUNTER — Ambulatory Visit (INDEPENDENT_AMBULATORY_CARE_PROVIDER_SITE_OTHER): Payer: BC Managed Care – PPO

## 2021-05-27 ENCOUNTER — Other Ambulatory Visit: Payer: Self-pay

## 2021-05-27 ENCOUNTER — Encounter: Payer: Self-pay | Admitting: Adult Health

## 2021-05-27 ENCOUNTER — Ambulatory Visit: Payer: BC Managed Care – PPO | Admitting: Adult Health

## 2021-05-27 VITALS — BP 124/78 | HR 95 | Temp 98.0°F | Ht 66.5 in | Wt 172.0 lb

## 2021-05-27 DIAGNOSIS — J4541 Moderate persistent asthma with (acute) exacerbation: Secondary | ICD-10-CM

## 2021-05-27 DIAGNOSIS — J45901 Unspecified asthma with (acute) exacerbation: Secondary | ICD-10-CM | POA: Insufficient documentation

## 2021-05-27 DIAGNOSIS — J45909 Unspecified asthma, uncomplicated: Secondary | ICD-10-CM | POA: Diagnosis not present

## 2021-05-27 MED ORDER — METHYLPREDNISOLONE ACETATE 80 MG/ML IJ SUSP
120.0000 mg | Freq: Once | INTRAMUSCULAR | Status: AC
  Administered 2021-05-27: 120 mg via INTRAMUSCULAR

## 2021-05-27 MED ORDER — PREDNISONE 10 MG PO TABS: ORAL_TABLET | ORAL | 0 refills | Status: DC

## 2021-05-27 NOTE — Addendum Note (Signed)
Addended by: Dessie Coma on: 05/27/2021 03:17 PM   Modules accepted: Orders

## 2021-05-27 NOTE — Assessment & Plan Note (Addendum)
Acute asthmatic bronchitic exacerbation slow to resolve.  Chest x-ray today.  We will give a Depo-Medrol injection today.  120 mg.  Prednisone taper.  Patient is continue on triple therapy with Trelegy.  Finish her antibiotic with Z-Pak. Will need follow-up in 1 week for preop pulmonary risk assessment.  As of right now would hold on endoscopy and colonoscopy with acute asthma exacerbation  Plan  Patient Instructions  Chest xray today  Depo Medrol shot today  Prednisone taper over next week.  Mucinex DM Twice daily  As needed  cough  Continue on Zyrtec  and Singulair daily  Duoneb As needed   Follow up in 1 week and As needed   Please contact office for sooner follow up if symptoms do not improve or worsen or seek emergency care

## 2021-05-27 NOTE — Patient Instructions (Addendum)
Chest xray today  Depo Medrol shot today  Prednisone taper over next week.  Mucinex DM Twice daily  As needed  cough  Continue on Zyrtec  and Singulair daily  Duoneb As needed   Follow up in 1 week and As needed   Please contact office for sooner follow up if symptoms do not improve or worsen or seek emergency care

## 2021-05-27 NOTE — Progress Notes (Signed)
$'@Patient'C$  ID: Heather [name], female    DOB: 18-Feb-1976, 45 y.o.   MRN: [id number]  Chief Complaint  Patient presents with   Follow-up    Referring provider: Einar Pheasant, MD  HPI: 45 year old female former smoker followed for moderate persistent asthma Covid 19 05/2020 - long hauler -fatigue, loss of taste/smell, nausea and GI issues , memory issues   TEST/EVENTS :  December 2021 IgE 55, allergy panel positive for dust, dog and cat dander, grass and trees, absolute eosinophil count 100  05/27/2021 Follow up : Asthma Patient presents for a follow-up visit.  Patient has underlying moderate persistent asthma.  She says that her asthma has been under better control since changing from Symbicort to Trelegy inhaler last year. Has had decreased exacerbations. Complains that 2 weeks ago, started having nasal congestion, drainage, cough and wheezing . Seen by PCP last week given Prednisone taper. Finished 2 days. Started Zpack 3 days ago, has 2 days left. Covid 19 test negative.   Patient does need a preop pulmonary risk assessment.  She has an upcoming colonoscopy and endoscopy due to ongoing issues nausea and upset stomach .   Allergies  Allergen Reactions   Levaquin [Levofloxacin] Swelling   Tramadol Other (See Comments)    Seizures   Amoxicillin Nausea And Vomiting   Augmentin [Amoxicillin-Pot Clavulanate] Other (See Comments)   Erythromycin Nausea And Vomiting   Lithium Nausea Only    Immunization History  Administered Date(s) Administered   PPD Test 07/20/2015    Past Medical History:  Diagnosis Date   Asthma    Depression    bipolar   Fainting spell    H/O   H/O cardiac arrhythmia    Migraines    Seizures ([facility])     Tobacco History: Social History   Tobacco Use  Smoking Status Former   Packs/day: 0.50   Years: 5.00   Pack years: 2.50   Types: Cigarettes  Smokeless Tobacco Never  Tobacco Comments   Quit mid October 2014   Counseling given: Not  Answered Tobacco comments: Quit mid October 2014   Outpatient Medications Prior to Visit  Medication Sig Dispense Refill   albuterol (VENTOLIN HFA) 108 (90 Base) MCG/ACT inhaler Inhale 1-2 puffs into the lungs every 4 (four) hours as needed for wheezing or shortness of breath. 18 g 11   azithromycin (ZITHROMAX) 250 MG tablet Take 2 tablets on day 1, then 1 tablet daily on days 2 through 5 6 tablet 0   cetirizine (ZYRTEC ALLERGY) 10 MG tablet Take 1 tablet (10 mg total) by mouth daily. 30 tablet 0   dextromethorphan-guaiFENesin (MUCINEX DM) 30-600 MG 12hr tablet Take 1 tablet by mouth 2 (two) times daily.     diazepam (VALIUM) 10 MG tablet Take 10 mg by mouth 4 (four) times daily as needed.     famotidine (PEPCID) 20 MG tablet Take 1 tablet (20 mg total) by mouth daily. 30 tablet 1   fluticasone (FLONASE) 50 MCG/ACT nasal spray Place 1 spray into both nostrils daily. 16 g 0   guaifenesin (ROBITUSSIN) 100 MG/5ML syrup Take 200 mg by mouth 3 (three) times daily as needed for cough.     ipratropium-albuterol (DUONEB) 0.5-2.5 (3) MG/3ML SOLN Inhale 3 mLs into the lungs every 4 (four) hours as needed. 360 mL 11   magnesium oxide (MAG-OX) 400 (240 Mg) MG tablet Take 1 tablet (400 mg total) by mouth daily. 30 tablet 2   montelukast (SINGULAIR) 10 MG tablet Take 1 tablet (  10 mg total) by mouth at bedtime. 90 tablet 3   ondansetron (ZOFRAN-ODT) 4 MG disintegrating tablet Take 1 tablet (4 mg total) by mouth every 8 (eight) hours as needed for nausea or vomiting. 20 tablet 0   rizatriptan (MAXALT-MLT) 10 MG disintegrating tablet TAKE ONE TABLET BY MOUTH ONCE AS NEEDED FOR ONSET OF MIGRAINE. MAY TAKE A SECOND DOSE AFTER TWO HOURS IF NEEDED 10 tablet 0   TRELEGY ELLIPTA 200-62.5-25 MCG/INH AEPB INHALE 1 PUFF ONCE DAILY 60 each 3   busPIRone (BUSPAR) 5 MG tablet Take 1 tablet (5 mg total) by mouth 2 (two) times daily as needed. (Patient not taking: Reported on 05/27/2021) 60 tablet 1   linaclotide (LINZESS) 145  MCG CAPS capsule Take 1 capsule (145 mcg total) by mouth daily before breakfast. (Patient not taking: Reported on 05/27/2021) 90 capsule 0   metroNIDAZOLE (FLAGYL) 500 MG tablet Take 1 tablet (500 mg total) by mouth 2 (two) times daily. (Patient not taking: Reported on 05/27/2021) 14 tablet 0   predniSONE (DELTASONE) 10 MG tablet Take 6 tablets x 1 day and then decrease by 1/2 tablet per day until down to zero mg. (Patient not taking: Reported on 05/27/2021) 39 tablet 0   QUEtiapine (SEROQUEL XR) 300 MG 24 hr tablet Take 600 mg by mouth at bedtime. (Patient not taking: Reported on 05/27/2021)     No facility-administered medications prior to visit.     Review of Systems:   Constitutional:   No  weight loss, night sweats,  Fevers, chills,  +fatigue, or  lassitude.  HEENT:   No headaches,  Difficulty swallowing,  Tooth/dental problems, or  Sore throat,                No sneezing, itching, ear ache,  +nasal congestion, post nasal drip,   CV:  No chest pain,  Orthopnea, PND, swelling in lower extremities, anasarca, dizziness, palpitations, syncope.   GI  No heartburn, indigestion, abdominal pain, nausea, vomiting, diarrhea, change in bowel habits, loss of appetite, bloody stools.   Resp:   No chest wall deformity  Skin: no rash or lesions.  GU: no dysuria, change in color of urine, no urgency or frequency.  No flank pain, no hematuria   MS:  No joint pain or swelling.  No decreased range of motion.  No back pain.    Physical Exam  BP 124/78 (BP Location: Left Arm, Patient Position: Sitting, Cuff Size: Normal)   Pulse 95   Temp 98 F (36.7 C) (Oral)   Ht 5' 6.5" (1.689 m)   Wt 172 lb (78 kg)   LMP 05/03/2021   SpO2 99%   BMI 27.35 kg/m   GEN: A/Ox3; pleasant , NAD, well nourished    HEENT:  [place]/AT,  EACs-clear, TMs-wnl, NOSE-clear, THROAT-clear, no lesions, no postnasal drip or exudate noted.   NECK:  Supple w/ fair ROM; no JVD; normal carotid impulses w/o bruits; no thyromegaly or  nodules palpated; no lymphadenopathy.    RESP  Exp wheezing, speaks in full sentences  no accessory muscle use, no dullness to percussion  CARD:  RRR, no m/r/g, no peripheral edema, pulses intact, no cyanosis or clubbing.  GI:   Soft & nt; nml bowel sounds; no organomegaly or masses detected.   Musco: Warm bil, no deformities or joint swelling noted.   Neuro: alert, no focal deficits noted.    Skin: Warm, no lesions or rashes    Lab Results:  CBC    Component Value  Date/Time   WBC 10.9 (H) 05/18/2021 0854   RBC 4.53 05/18/2021 0854   HGB 13.0 05/18/2021 0854   HGB 13.2 06/19/2014 1437   HCT 39.6 05/18/2021 0854   HCT 40.7 06/19/2014 1437   PLT 231.0 05/18/2021 0854   PLT 185 06/19/2014 1437   MCV 87.6 05/18/2021 0854   MCV 87 06/19/2014 1437   MCH 28.1 02/02/2016 1641   MCHC 32.7 05/18/2021 0854   RDW 14.3 05/18/2021 0854   RDW 14.2 06/19/2014 1437   LYMPHSABS 1.1 05/18/2021 0854   MONOABS 0.7 05/18/2021 0854   EOSABS 0.2 05/18/2021 0854   BASOSABS 0.1 05/18/2021 0854    BMET    Component Value Date/Time   NA 136 05/18/2021 0854   NA 138 06/19/2014 1437   K 4.1 05/18/2021 0854   K 3.5 06/19/2014 1437   CL 105 05/18/2021 0854   CL 107 06/19/2014 1437   CO2 23 05/18/2021 0854   CO2 25 06/19/2014 1437   GLUCOSE 75 05/18/2021 0854   GLUCOSE 105 (H) 06/19/2014 1437   BUN 8 05/18/2021 0854   BUN 5 (L) 06/19/2014 1437   CREATININE 0.83 05/18/2021 0854   CREATININE 0.89 06/19/2014 1437   CALCIUM 8.8 05/18/2021 0854   CALCIUM 8.2 (L) 06/19/2014 1437   GFRNONAA >60 02/02/2016 1641   GFRNONAA >60 06/19/2014 1437   GFRNONAA >60 04/23/2013 0950   GFRAA >60 02/02/2016 1641   GFRAA >60 06/19/2014 1437   GFRAA >60 04/23/2013 0950    BNP No results found for: BNP  ProBNP No results found for: PROBNP  Imaging: No results found.    No flowsheet data found.  No results found for: NITRICOXIDE      Assessment & Plan:   Asthmatic bronchitis with acute  exacerbation Acute asthmatic bronchitic exacerbation slow to resolve.  Chest x-ray today.  We will give a Depo-Medrol injection today.  120 mg.  Prednisone taper.  Patient is continue on triple therapy with Trelegy.  Finish her antibiotic with Z-Pak. Will need follow-up in 1 week for preop pulmonary risk assessment.  As of right now would hold on endoscopy and colonoscopy with acute asthma exacerbation  Plan  Patient Instructions  Chest xray today  Depo Medrol shot today  Prednisone taper over next week.  Mucinex DM Twice daily  As needed  cough  Continue on Zyrtec  and Singulair daily  Duoneb As needed   Follow up in 1 week and As needed   Please contact office for sooner follow up if symptoms do not improve or worsen or seek emergency care          I spent   30 minutes dedicated to the care of this patient on the date of this encounter to include pre-visit review of records, face-to-face time with the patient discussing conditions above, post visit ordering of testing, clinical documentation with the electronic health record, making appropriate referrals as documented, and communicating necessary findings to members of the patients care team.    Rexene Edison, NP 05/27/2021

## 2021-06-01 ENCOUNTER — Ambulatory Visit: Payer: BC Managed Care – PPO | Admitting: Pulmonary Disease

## 2021-06-01 ENCOUNTER — Ambulatory Visit: Payer: BC Managed Care – PPO | Admitting: Adult Health

## 2021-06-01 ENCOUNTER — Telehealth: Payer: Self-pay | Admitting: Internal Medicine

## 2021-06-01 ENCOUNTER — Encounter: Payer: Self-pay | Admitting: Pulmonary Disease

## 2021-06-01 ENCOUNTER — Other Ambulatory Visit: Payer: Self-pay

## 2021-06-01 VITALS — BP 110/70 | HR 108 | Temp 98.2°F | Ht 66.5 in | Wt 173.2 lb

## 2021-06-01 DIAGNOSIS — J4541 Moderate persistent asthma with (acute) exacerbation: Secondary | ICD-10-CM | POA: Diagnosis not present

## 2021-06-01 NOTE — Telephone Encounter (Signed)
Paperwork received. Patient is aware. Will complete

## 2021-06-01 NOTE — Patient Instructions (Signed)
Clear to go ahead with endoscopy  Follow up in 6 months  Call with significant concerns

## 2021-06-01 NOTE — Telephone Encounter (Signed)
Patient came in to drop off her FMLA paperwork,it is upfront in Dr.Scotts color folder.

## 2021-06-01 NOTE — Progress Notes (Signed)
Heather [name]    [id number]    1976-05-01   Primary Care Physician:Heather [name]  Referring Physician: Einar [name], [place] [id number] [place],  [place] [address]  Chief complaint:   History of asthma, shortness of breath, recent Covid  HPI: Recently treated for an exacerbation  Symptoms are much better since being on Trelegy  Still with need to use rescue inhaler and nebulization treatments  Patient is scheduled for EGD In today for visit prior to EGD  Symptoms are stable at present  Symptoms associated with Covid have improved  Asthma was moderately severe will get exacerbations up to 3-4 times a year -Was on Symbicort 160  Daughter has severe asthma as well  Quit smoking in 2013  X-rays have been negative during his exacerbation relating to Covid infection   Outpatient Encounter Medications as of 06/01/2021  Medication Sig   albuterol (VENTOLIN HFA) 108 (90 Base) MCG/ACT inhaler Inhale 1-2 puffs into the lungs every 4 (four) hours as needed for wheezing or shortness of breath.   cetirizine (ZYRTEC ALLERGY) 10 MG tablet Take 1 tablet (10 mg total) by mouth daily.   dextromethorphan-guaiFENesin (MUCINEX DM) 30-600 MG 12hr tablet Take 1 tablet by mouth 2 (two) times daily.   diazepam (VALIUM) 10 MG tablet Take 10 mg by mouth 4 (four) times daily as needed.   famotidine (PEPCID) 20 MG tablet Take 1 tablet (20 mg total) by mouth daily.   fluticasone (FLONASE) 50 MCG/ACT nasal spray Place 1 spray into both nostrils daily.   guaifenesin (ROBITUSSIN) 100 MG/5ML syrup Take 200 mg by mouth 3 (three) times daily as needed for cough.   ipratropium-albuterol (DUONEB) 0.5-2.5 (3) MG/3ML SOLN Inhale 3 mLs into the lungs every 4 (four) hours as needed.   magnesium oxide (MAG-OX) 400 (240 Mg) MG tablet Take 1 tablet (400 mg total) by mouth daily.   montelukast (SINGULAIR) 10 MG tablet Take 1 tablet (10 mg total) by mouth at bedtime.    ondansetron (ZOFRAN-ODT) 4 MG disintegrating tablet Take 1 tablet (4 mg total) by mouth every 8 (eight) hours as needed for nausea or vomiting.   predniSONE (DELTASONE) 10 MG tablet 4 tabs for 2 days, then 3 tabs for 2 days, 2 tabs for 2 days, then 1 tab for 2 days, then stop   rizatriptan (MAXALT-MLT) 10 MG disintegrating tablet TAKE ONE TABLET BY MOUTH ONCE AS NEEDED FOR ONSET OF MIGRAINE. MAY TAKE A SECOND DOSE AFTER TWO HOURS IF NEEDED   TRELEGY ELLIPTA 200-62.5-25 MCG/INH AEPB INHALE 1 PUFF ONCE DAILY   No facility-administered encounter medications on file as of 06/01/2021.    Allergies as of 06/01/2021 - Review Complete 06/01/2021  Allergen Reaction Noted   Levaquin [levofloxacin] Swelling 04/30/2020   Tramadol Other (See Comments) 02/20/2013   Amoxicillin Nausea And Vomiting 01/20/2019   Augmentin [amoxicillin-pot clavulanate] Other (See Comments) 02/20/2013   Erythromycin Nausea And Vomiting 06/25/2015   Lithium Nausea Only 02/20/2013    Past Medical History:  Diagnosis Date   Asthma    Depression    bipolar   Fainting spell    H/O   H/O cardiac arrhythmia    Migraines    Seizures ([place])     Past Surgical History:  Procedure Laterality Date   TUBAL LIGATION Bilateral 09/2002    Family History  Problem Relation Age of Onset   Hypertension Mother    Diabetes Mother    Arthritis Mother  Heart disease Mother    Breast cancer Neg Hx     Social History   Socioeconomic History   Marital status: Married    Spouse name: Not on file   Number of children: Not on file   Years of education: Not on file   Highest education level: Not on file  Occupational History   Not on file  Tobacco Use   Smoking status: Former    Packs/day: 0.50    Years: 5.00    Pack years: 2.50    Types: Cigarettes   Smokeless tobacco: Never   Tobacco comments:    Quit mid October 2014  Substance and Sexual Activity   Alcohol use: No    Alcohol/week: 0.0 standard drinks   Drug use: No    Sexual activity: Not on file  Other Topics Concern   Not on file  Social History Narrative   Not on file   Social Determinants of Health   Financial Resource Strain: Not on file  Food Insecurity: Not on file  Transportation Needs: Not on file  Physical Activity: Not on file  Stress: Not on file  Social Connections: Not on file  Intimate Partner Violence: Not on file    Review of Systems  Constitutional:  Negative for fatigue.  Respiratory:  Positive for cough and shortness of breath.    Vitals:   06/01/21 1435  BP: 110/70  Pulse: (!) 108  Temp: 98.2 F (36.8 C)  SpO2: 97%     Physical Exam Constitutional:      Appearance: Normal appearance.  HENT:     Head: Normocephalic.     Mouth/Throat:     Mouth: Mucous membranes are moist.  Eyes:     General:        Right eye: No discharge.        Left eye: No discharge.  Cardiovascular:     Rate and Rhythm: Normal rate and regular rhythm.     Heart sounds: No murmur heard.   No friction rub.  Pulmonary:     Effort: No respiratory distress.     Breath sounds: No stridor. No wheezing or rhonchi.  Musculoskeletal:     Cervical back: No rigidity or tenderness.  Neurological:     Mental Status: [name].  Psychiatric:        Mood and Affect: Mood normal.     Data Reviewed: Most recent chest x-ray 06/16/2020 reviewed by myself showing no acute infiltrate  Assessment:  Recent Covid infection  -Symptoms have generally improved  History of asthma -Moderate persistent -Use of inhalers have improved -Did receive Depo-Medrol shot and oral steroids during last visit  Shortness of breath with exertion -Stable  Patient will be having endoscopy -No acutely reversible problems at present  [name]   Plan/Recommendations:  [name] -[name] -May need Solu-Medrol and nebulization  treatment perioperatively  Continue Trelegy  Tapering dose of steroids  Tentative follow-up in 6 months   Heather Rist [name]  Pulmonary and Critical Care 06/01/2021, 2:49 PM  CC: Heather Pheasant, [name]

## 2021-06-03 ENCOUNTER — Ambulatory Visit: Payer: BC Managed Care – PPO

## 2021-06-03 ENCOUNTER — Other Ambulatory Visit: Payer: Self-pay

## 2021-06-03 ENCOUNTER — Encounter
Admission: RE | Disposition: A | Discharge status: Home / Self Care | Payer: Self-pay | Source: Home / Self Care | Attending: Gastroenterology

## 2021-06-03 ENCOUNTER — Encounter: Payer: Self-pay | Admitting: Gastroenterology

## 2021-06-03 ENCOUNTER — Ambulatory Visit
Admission: RE | Admit: 2021-06-03 | Discharge: 2021-06-03 | Disposition: A | Discharge status: Home / Self Care | Payer: BC Managed Care – PPO | Attending: Gastroenterology | Admitting: Gastroenterology

## 2021-06-03 DIAGNOSIS — R112 Nausea with vomiting, unspecified: Secondary | ICD-10-CM | POA: Insufficient documentation

## 2021-06-03 DIAGNOSIS — K621 Rectal polyp: Secondary | ICD-10-CM | POA: Diagnosis not present

## 2021-06-03 DIAGNOSIS — R109 Unspecified abdominal pain: Secondary | ICD-10-CM | POA: Diagnosis not present

## 2021-06-03 DIAGNOSIS — K449 Diaphragmatic hernia without obstruction or gangrene: Secondary | ICD-10-CM | POA: Diagnosis not present

## 2021-06-03 DIAGNOSIS — K2100 Gastro-esophageal reflux disease with esophagitis, without bleeding: Secondary | ICD-10-CM | POA: Diagnosis not present

## 2021-06-03 DIAGNOSIS — R1084 Generalized abdominal pain: Secondary | ICD-10-CM | POA: Diagnosis not present

## 2021-06-03 DIAGNOSIS — K648 Other hemorrhoids: Secondary | ICD-10-CM | POA: Diagnosis not present

## 2021-06-03 DIAGNOSIS — Z7951 Long term (current) use of inhaled steroids: Secondary | ICD-10-CM | POA: Insufficient documentation

## 2021-06-03 DIAGNOSIS — K6289 Other specified diseases of anus and rectum: Secondary | ICD-10-CM | POA: Diagnosis not present

## 2021-06-03 DIAGNOSIS — Z87891 Personal history of nicotine dependence: Secondary | ICD-10-CM | POA: Diagnosis not present

## 2021-06-03 DIAGNOSIS — K635 Polyp of colon: Secondary | ICD-10-CM | POA: Diagnosis not present

## 2021-06-03 DIAGNOSIS — R1110 Vomiting, unspecified: Secondary | ICD-10-CM

## 2021-06-03 DIAGNOSIS — K3189 Other diseases of stomach and duodenum: Secondary | ICD-10-CM | POA: Diagnosis not present

## 2021-06-03 DIAGNOSIS — Z79899 Other long term (current) drug therapy: Secondary | ICD-10-CM | POA: Diagnosis not present

## 2021-06-03 DIAGNOSIS — D128 Benign neoplasm of rectum: Secondary | ICD-10-CM | POA: Diagnosis not present

## 2021-06-03 DIAGNOSIS — Z1211 Encounter for screening for malignant neoplasm of colon: Secondary | ICD-10-CM | POA: Diagnosis not present

## 2021-06-03 HISTORY — PX: ESOPHAGOGASTRODUODENOSCOPY: SHX5428

## 2021-06-03 HISTORY — PX: COLONOSCOPY WITH PROPOFOL: SHX5780

## 2021-06-03 HISTORY — DX: Cardiac arrhythmia, unspecified: I49.9

## 2021-06-03 LAB — KOH PREP: KOH Prep: NONE SEEN

## 2021-06-03 LAB — POCT PREGNANCY, URINE: Preg Test, Ur: NEGATIVE

## 2021-06-03 SURGERY — COLONOSCOPY WITH PROPOFOL
Anesthesia: General

## 2021-06-03 MED ORDER — PROPOFOL 500 MG/50ML IV EMUL
INTRAVENOUS | Status: AC
  Filled 2021-06-03: qty 50

## 2021-06-03 MED ORDER — PROPOFOL 10 MG/ML IV BOLUS
INTRAVENOUS | Status: AC
  Filled 2021-06-03: qty 20

## 2021-06-03 MED ORDER — PHENYLEPHRINE HCL (PRESSORS) 10 MG/ML IV SOLN
INTRAVENOUS | Status: AC
  Filled 2021-06-03: qty 1

## 2021-06-03 MED ORDER — LIDOCAINE HCL (PF) 2 % IJ SOLN
INTRAMUSCULAR | Status: AC
  Filled 2021-06-03: qty 5

## 2021-06-03 MED ORDER — ESOMEPRAZOLE MAGNESIUM 10 MG PO PACK: 20.0000 mg | PACK | Freq: Every day | ORAL | 0 refills | Status: DC

## 2021-06-03 MED ORDER — GLYCOPYRROLATE 0.2 MG/ML IJ SOLN
INTRAMUSCULAR | Status: AC
  Filled 2021-06-03: qty 1

## 2021-06-03 MED ORDER — HYDROCORTISONE SOD SUC (PF) 100 MG IJ SOLR
INTRAMUSCULAR | Status: AC
  Filled 2021-06-03: qty 2

## 2021-06-03 MED ORDER — MIDAZOLAM HCL 2 MG/2ML IJ SOLN
INTRAMUSCULAR | Status: DC | PRN
  Administered 2021-06-03: 2 mg via INTRAVENOUS

## 2021-06-03 MED ORDER — PROPOFOL 10 MG/ML IV BOLUS
INTRAVENOUS | Status: DC | PRN
Start: 2021-06-03 — End: 2021-06-03
  Administered 2021-06-03: 10 mg via INTRAVENOUS
  Administered 2021-06-03: 20 mg via INTRAVENOUS
  Administered 2021-06-03: 40 mg via INTRAVENOUS
  Administered 2021-06-03 (×2): 20 mg via INTRAVENOUS

## 2021-06-03 MED ORDER — SODIUM CHLORIDE 0.9 % IV SOLN: INTRAVENOUS | Status: DC

## 2021-06-03 MED ORDER — HYDROCORTISONE SOD SUC (PF) 100 MG IJ SOLR
INTRAMUSCULAR | Status: DC | PRN
  Administered 2021-06-03: 50 mg via INTRAVENOUS

## 2021-06-03 MED ORDER — ALBUTEROL SULFATE HFA 108 (90 BASE) MCG/ACT IN AERS
INHALATION_SPRAY | RESPIRATORY_TRACT | Status: AC
  Filled 2021-06-03: qty 6.7

## 2021-06-03 MED ORDER — ALBUTEROL SULFATE HFA 108 (90 BASE) MCG/ACT IN AERS
INHALATION_SPRAY | RESPIRATORY_TRACT | Status: DC | PRN
  Administered 2021-06-03: 2 via RESPIRATORY_TRACT

## 2021-06-03 MED ORDER — PROPOFOL 500 MG/50ML IV EMUL
INTRAVENOUS | Status: DC | PRN
  Administered 2021-06-03: 120 ug/kg/min via INTRAVENOUS

## 2021-06-03 MED ORDER — MIDAZOLAM HCL 2 MG/2ML IJ SOLN
INTRAMUSCULAR | Status: AC
  Filled 2021-06-03: qty 2

## 2021-06-03 NOTE — H&P (Signed)
Vonda Antigua, MD [address] [place], [place], [place], [address] [address], [place], [place], [place], [address] Phone: [phone]  Fax: [phone]  Primary Care Physician:  Einar Pheasant, MD   Pre-Procedure History & Physical: HPI:  Heather [name] is a 45 y.o. female is here for a colonoscopy and EGD.   Past Medical History:  Diagnosis Date   Asthma    Depression    bipolar   Dysrhythmia    Fainting spell    H/O   H/O cardiac arrhythmia    Migraines    Seizures ([facility])     Past Surgical History:  Procedure Laterality Date   TUBAL LIGATION Bilateral 09/25/2002   tubal reversal      Prior to Admission medications   Medication Sig Start Date End Date Taking? Authorizing Provider  albuterol (VENTOLIN HFA) 108 (90 Base) MCG/ACT inhaler Inhale 1-2 puffs into the lungs every 4 (four) hours as needed for wheezing or shortness of breath. 05/09/21  Yes Einar Pheasant, MD  cetirizine (ZYRTEC ALLERGY) 10 MG tablet Take 1 tablet (10 mg total) by mouth daily. 06/07/20  Yes Hall-Potvin, [place], PA-C  dextromethorphan-guaiFENesin (MUCINEX DM) 30-600 MG 12hr tablet Take 1 tablet by mouth 2 (two) times daily.   Yes [provider]  diazepam (VALIUM) 10 MG tablet Take 10 mg by mouth 4 (four) times daily as needed. 05/07/21  Yes [provider]  famotidine (PEPCID) 20 MG tablet Take 1 tablet (20 mg total) by mouth daily. 01/02/19  Yes Einar Pheasant, MD  fluticasone (FLONASE) 50 MCG/ACT nasal spray Place 1 spray into both nostrils daily. 06/07/20  Yes Hall-Potvin, [place], PA-C  guaifenesin (ROBITUSSIN) 100 MG/5ML syrup Take 200 mg by mouth 3 (three) times daily as needed for cough.   Yes [provider]  ipratropium-albuterol (DUONEB) 0.5-2.5 (3) MG/3ML SOLN Inhale 3 mLs into the lungs every 4 (four) hours as needed. 05/09/21  Yes Einar Pheasant, MD  magnesium oxide (MAG-OX) 400 (240 Mg) MG tablet Take 1 tablet (400 mg total) by mouth daily.  02/04/21  Yes Einar Pheasant, MD  montelukast (SINGULAIR) 10 MG tablet Take 1 tablet (10 mg total) by mouth at bedtime. 04/30/20  Yes McLean-Scocuzza, Nino Glow, MD  ondansetron (ZOFRAN-ODT) 4 MG disintegrating tablet Take 1 tablet (4 mg total) by mouth every 8 (eight) hours as needed for nausea or vomiting. 04/11/21  Yes Einar Pheasant, MD  predniSONE (DELTASONE) 10 MG tablet 4 tabs for 2 days, then 3 tabs for 2 days, 2 tabs for 2 days, then 1 tab for 2 days, then stop 05/27/21  Yes Parrett, Tammy S, NP  rizatriptan (MAXALT-MLT) 10 MG disintegrating tablet TAKE ONE TABLET BY MOUTH ONCE AS NEEDED FOR ONSET OF MIGRAINE. MAY TAKE A SECOND DOSE AFTER TWO HOURS IF NEEDED 04/11/21  Yes Einar Pheasant, MD  TRELEGY ELLIPTA 200-62.5-25 MCG/INH AEPB INHALE 1 PUFF ONCE DAILY 05/04/21  Yes Olalere, Adewale A, MD    Allergies as of 04/27/2021 - Review Complete 04/26/2021  Allergen Reaction Noted   Amoxicillin Nausea And Vomiting 01/20/2019   Augmentin [amoxicillin-pot clavulanate]  02/20/2013   Erythromycin Nausea And Vomiting 06/25/2015   Levaquin [levofloxacin] Swelling 04/30/2020   Lithium Nausea Only 02/20/2013   Tramadol  02/20/2013    Family History  Problem Relation Age of Onset   Hypertension Mother    Diabetes Mother    Arthritis Mother    Heart disease Mother    Breast cancer Neg Hx     Social History  Socioeconomic History   Marital status: Married    Spouse name: Not on file   Number of children: Not on file   Years of education: Not on file   Highest education level: Not on file  Occupational History   Not on file  Tobacco Use   Smoking status: Former    Packs/day: 0.50    Years: 5.00    Pack years: 2.50    Types: Cigarettes   Smokeless tobacco: Never   Tobacco comments:    Quit mid October 2014  Vaping Use   Vaping Use: Never used  Substance and Sexual Activity   Alcohol use: No    Alcohol/week: 0.0 standard drinks   Drug use: No   Sexual activity: Not on file  Other  Topics Concern   Not on file  Social History Narrative   Not on file   Social Determinants of Health   Financial Resource Strain: Not on file  Food Insecurity: Not on file  Transportation Needs: Not on file  Physical Activity: Not on file  Stress: Not on file  Social Connections: Not on file  Intimate Partner Violence: Not on file    Review of Systems: See HPI, otherwise negative ROS  Constitutional: General:   Alert,  Well-developed, well-nourished, pleasant and cooperative in NAD BP 108/73   Pulse 95   Temp (!) 97.1 F (36.2 C) (Temporal)   Resp 16   Ht '5\' 7"'$  (1.702 m)   Wt 78 kg   LMP 05/19/2021   SpO2 100%   BMI 26.94 kg/m   Head: Normocephalic, atraumatic.   Eyes:  Sclera clear, no icterus.   Conjunctiva pink.   Mouth:  No deformity or lesions, oropharynx pink & moist.  Neck:  Supple, trachea midline  Respiratory: Normal respiratory effort  Gastrointestinal:  Soft, non-tender and non-distended without masses, hepatosplenomegaly or hernias noted.  No guarding or rebound tenderness.     Cardiac: No clubbing or edema.  No cyanosis. Normal posterior tibial pedal pulses noted.  Lymphatic:  No significant cervical adenopathy.  Psych:  Alert and cooperative. Normal mood and affect.  Musculoskeletal:   Symmetrical without gross deformities. 5/5 Lower extremity strength bilaterally.  Skin: Warm. Intact without significant lesions or rashes. No jaundice.  Neurologic:  Face symmetrical, tongue midline, Normal sensation to touch;  grossly normal neurologically.  Psych:  Alert and oriented x3, Alert and cooperative. Normal mood and affect.  Impression/Plan: Heather [name] is here for a colonoscopy to be performed for average risk screening and EGD for Abdominal pain, Nausea/vomiting.  Risks, benefits, limitations, and alternatives regarding the procedures have been reviewed with the patient.  Questions have been answered.  All parties agreeable.   Virgel Manifold, MD  06/03/2021, 7:41 AM

## 2021-06-03 NOTE — Transfer of Care (Signed)
Immediate Anesthesia Transfer of Care Note  Patient: CHAI VERDEJO  Procedure(s) Performed: COLONOSCOPY WITH PROPOFOL ESOPHAGOGASTRODUODENOSCOPY (EGD)  Patient Location: PACU  Anesthesia Type:MAC  Level of Consciousness: drowsy  Airway & Oxygen Therapy: Patient Spontanous Breathing  Post-op Assessment: Report given to RN and Post -op Vital signs reviewed and stable  Post vital signs: Reviewed and stable  Last Vitals:  Vitals Value Taken Time  BP 103/66 06/03/21 0842  Temp    Pulse 87 06/03/21 0843  Resp 25 06/03/21 0843  SpO2 100 % 06/03/21 0843  Vitals shown include unvalidated device data.  Last Pain:  Vitals:   06/03/21 0738  TempSrc: Temporal  PainSc: 0-No pain         Complications: No notable events documented.

## 2021-06-03 NOTE — Telephone Encounter (Signed)
Paper work has been completed and faxed. Patient is aware and will pick up copy on Monday

## 2021-06-03 NOTE — Op Note (Signed)
[facility] Gastroenterology Patient Name: Heather [name] Procedure Date: 06/03/2021 7:47 AM MRN: [id number] Account #: [id number] Date of Birth: Apr 10, 1976 Admit Type: Outpatient Age: 45 Room: [facility] ENDO ROOM 2 Gender: Female Note Status: Finalized Instrument Name: Jasper Riling [id number] Procedure:             Colonoscopy Indications:           Screening for colorectal malignant neoplasm Providers:             Erling Arrazola B. Bonna Gains MD, MD Referring MD:          Einar Pheasant, MD (Referring MD) Medicines:             Monitored Anesthesia Care Complications:         No immediate complications. Procedure:             Pre-Anesthesia Assessment:                        - ASA Grade Assessment: II - A patient with mild                         systemic disease.                        - Prior to the procedure, a History and Physical was                         performed, and patient medications, allergies and                         sensitivities were reviewed. The patient's tolerance                         of previous anesthesia was reviewed.                        - The risks and benefits of the procedure and the                         sedation options and risks were discussed with the                         patient. All questions were answered and informed                         consent was obtained.                        - Patient identification and proposed procedure were                         verified prior to the procedure by the physician, the                         nurse, the anesthesiologist, the anesthetist and the                         technician. The procedure was verified in the  procedure room.                        After obtaining informed consent, the colonoscope was                         passed under direct vision. Throughout the procedure,                         the patient's blood pressure, pulse, and oxygen                          saturations were monitored continuously. The                         Colonoscope was introduced through the anus and                         advanced to the the cecum, identified by appendiceal                         orifice and ileocecal valve. The colonoscopy was                         performed with ease. The patient tolerated the                         procedure well. The quality of the bowel preparation                         was poor. Vegetable matter was present throughout the                         colon, causing clogging of the suction channel. Stool                         could not be completely cleared but was partially                         cleared to improve visualization. Findings:      The perianal and digital rectal examinations were normal.      A 4 mm polyp was found in the rectum. The polyp was flat. The polyp was       removed with a cold biopsy forceps. Resection and retrieval were       complete.      The exam was otherwise without abnormality.      The rectum, sigmoid colon, descending colon, transverse colon, ascending       colon and cecum appeared normal.      Non-bleeding internal hemorrhoids were found during retroflexion.      Anal papilla(e) were hypertrophied.      No additional abnormalities were found on retroflexion. Impression:            - Preparation of the colon was poor.                        - One 4 mm polyp in the rectum, removed with a cold  biopsy forceps. Resected and retrieved.                        - The examination was otherwise normal.                        - The rectum, sigmoid colon, descending colon,                         transverse colon, ascending colon and cecum are normal.                        - Non-bleeding internal hemorrhoids.                        - Anal papilla(e) were hypertrophied. Recommendation:        - Await pathology results.                        - High fiber diet.                         - Discharge patient to home (with escort).                        - Advance diet as tolerated.                        - Continue present medications.                        - Repeat colonoscopy in 1 year, with 2 day prep and                         low fiber diet for 3-5 days, because the bowel                         preparation was suboptimal.                        - The findings and recommendations were discussed with                         the patient.                        - The findings and recommendations were discussed with                         the patient's family.                        - Return to primary care physician as previously                         scheduled. Procedure Code(s):     --- Professional ---                        [phone], Colonoscopy, flexible; with biopsy, single or  multiple Diagnosis Code(s):     --- Professional ---                        Z12.11, Encounter for screening for malignant neoplasm                         of colon                        K64.8, Other hemorrhoids                        K62.1, Rectal polyp                        K62.89, Other specified diseases of anus and rectum CPT copyright 2019 [employer]. All rights reserved. The codes documented in this report are preliminary and upon coder review may  be revised to meet current compliance requirements.  Vonda Antigua, MD Margretta Sidle B. Bonna Gains MD, MD 06/03/2021 8:46:48 AM This report has been signed electronically. Number of Addenda: 0 Note Initiated On: 06/03/2021 7:47 AM Scope Withdrawal Time: 0 hours 7 minutes 7 seconds  Total Procedure Duration: 0 hours 25 minutes 29 seconds  Estimated Blood Loss:  Estimated blood loss: none.      [facility]

## 2021-06-03 NOTE — Op Note (Signed)
[facility] Gastroenterology Patient Name: Heather [name] Procedure Date: 06/03/2021 7:47 AM MRN: [id number] Account #: [id number] Date of Birth: Oct 21, 1975 Admit Type: Outpatient Age: 45 Room: [facility] ENDO ROOM 2 Gender: Female Note Status: Finalized Instrument Name: Upper Endoscope [id number] Procedure:             Upper GI endoscopy Indications:           Abdominal pain, Nausea with vomiting Providers:             Andreea Arca B. Bonna Gains MD, MD Referring MD:          Einar Pheasant, MD (Referring MD) Medicines:             Monitored Anesthesia Care Complications:         No immediate complications. Procedure:             Pre-Anesthesia Assessment:                        - Prior to the procedure, a History and Physical was                         performed, and patient medications, allergies and                         sensitivities were reviewed. The patient's tolerance                         of previous anesthesia was reviewed.                        - The risks and benefits of the procedure and the                         sedation options and risks were discussed with the                         patient. All questions were answered and informed                         consent was obtained.                        - Patient identification and proposed procedure were                         verified prior to the procedure by the physician, the                         nurse, the anesthesiologist, the anesthetist and the                         technician. The procedure was verified in the                         procedure room.                        - ASA Grade Assessment: II - A patient with mild  systemic disease.                        After obtaining informed consent, the endoscope was                         passed under direct vision. Throughout the procedure,                         the patient's blood pressure, pulse, and oxygen                          saturations were monitored continuously. The Endoscope                         was introduced through the mouth, and advanced to the                         second part of duodenum. The upper GI endoscopy was                         accomplished with ease. The patient tolerated the                         procedure well. Findings:      LA Grade A (one or more mucosal breaks less than 5 mm, not extending       between tops of 2 mucosal folds) esophagitis with no bleeding was found       at the gastroesophageal junction.      White nummular lesions were noted in the mid esophagus. Brushings for       KOH prep were obtained.      The exam of the esophagus was otherwise normal.      A small hiatal hernia was present.      A medium amount of food (residue) was found in the entire examined       stomach. Biopsies were obtained in the gastric body, at the incisura and       in the gastric antrum with cold forceps for Helicobacter pylori testing.       The presence of food affected visualization as it could not be       completely cleared.      The exam of the stomach was otherwise normal.      Patchy mild mucosal changes characterized by discoloration were found in       the second portion of the duodenum. Biopsies were taken with a cold       forceps for histology. Biopsies were done around the 5 to 6 o clock       positions to avoid biopsies near the area the ampulla, which is usually       seen around the 11 to 12 o clock positions. The ampulla was not seen on       this exam.      The exam of the duodenum was otherwise normal. Impression:            - LA Grade A reflux esophagitis with no bleeding.                        - White nummular lesions in esophageal mucosa.  Brushings performed.                        - Small hiatal hernia.                        - A medium amount of food (residue) in the stomach.                        - Mucosal changes in the  duodenum. Biopsied.                        - Biopsies were obtained in the gastric body, at the                         incisura and in the gastric antrum. Recommendation:        - Await pathology results.                        - Gastric emptying study to be scheduled                        - Refer to a surgeon for evaluation of hiatal hernia                         repair.                        - Discharge patient to home (with escort).                        - Advance diet as tolerated.                        - Continue present medications.                        - Patient has a contact number available for                         emergencies. The signs and symptoms of potential                         delayed complications were discussed with the patient.                         Return to normal activities tomorrow. Written                         discharge instructions were provided to the patient.                        - Discharge patient to home (with escort).                        - The findings and recommendations were discussed with                         the patient.                        -  The findings and recommendations were discussed with                         the patient's family.                        - Follow an antireflux regimen.                        - Take prescribed proton pump inhibitor or H2 blocker                         (antacid) medications 30 - 60 minutes before meals. Procedure Code(s):     --- Professional ---                        [phone], Esophagogastroduodenoscopy, flexible,                         transoral; with biopsy, single or multiple Diagnosis Code(s):     --- Professional ---                        K21.00, Gastro-esophageal reflux disease with                         esophagitis, without bleeding                        K22.8, Other specified diseases of esophagus                        K44.9, Diaphragmatic hernia without obstruction or                          gangrene                        K31.89, Other diseases of stomach and duodenum                        R10.9, Unspecified abdominal pain                        R11.2, Nausea with vomiting, unspecified CPT copyright 2019 American Medical Association. All rights reserved. The codes documented in this report are preliminary and upon coder review may  be revised to meet current compliance requirements.  Vonda Antigua, MD Margretta Sidle B. Bonna Gains MD, MD 06/03/2021 8:42:19 AM This report has been signed electronically. Number of Addenda: 0 Note Initiated On: 06/03/2021 7:47 AM Estimated Blood Loss:  Estimated blood loss: none.      [facility]

## 2021-06-03 NOTE — Anesthesia Postprocedure Evaluation (Signed)
Anesthesia Post Note  Patient: Heather [name]  Procedure(s) Performed: COLONOSCOPY WITH PROPOFOL ESOPHAGOGASTRODUODENOSCOPY (EGD)  Patient location during evaluation: PACU Anesthesia Type: General Level of consciousness: awake and alert, awake and oriented Pain management: pain level controlled Vital Signs Assessment: post-procedure vital signs reviewed and stable Respiratory status: spontaneous breathing, nonlabored ventilation and respiratory function stable Cardiovascular status: blood pressure returned to baseline and stable Postop Assessment: no apparent nausea or vomiting Anesthetic complications: no   No notable events documented.   Last Vitals:  Vitals:   06/03/21 0901 06/03/21 0905  BP:  101/60  Pulse: 88 86  Resp:    Temp:    SpO2: 100% 100%    Last Pain:  Vitals:   06/03/21 0843  TempSrc: Temporal  PainSc:                  Phill Mutter

## 2021-06-03 NOTE — Anesthesia Preprocedure Evaluation (Signed)
Anesthesia Evaluation  Patient identified by MRN, date of birth, ID band Patient awake    Reviewed: Allergy & Precautions, NPO status , Patient's Chart, lab work & pertinent test results  Airway Mallampati: II  TM Distance: >3 FB Neck ROM: Full    Dental no notable dental hx.    Pulmonary asthma (Inhalers and Steroids) , former smoker,     + decreased breath sounds      Cardiovascular negative cardio ROS Normal cardiovascular exam     Neuro/Psych  Headaches, Seizures -, Well Controlled,  PSYCHIATRIC DISORDERS Anxiety Depression    GI/Hepatic Neg liver ROS, Bowel prep,GERD  Medicated,  Endo/Other  negative endocrine ROS  Renal/GU negative Renal ROS  negative genitourinary   Musculoskeletal negative musculoskeletal ROS (+)   Abdominal   Peds negative pediatric ROS (+)  Hematology negative hematology ROS (+)   Anesthesia Other Findings Asthma    Depression  bipolar  Fainting spell  H/O  H/O cardiac arrhythmia  Migraines    Seizures (HCC)       Reproductive/Obstetrics negative OB ROS                            Anesthesia Physical Anesthesia Plan  ASA: 3  Anesthesia Plan: General   Post-op Pain Management:    Induction: Intravenous  PONV Risk Score and Plan: 2 and Propofol infusion and TIVA  Airway Management Planned: Natural Airway and Nasal Cannula  Additional Equipment:   Intra-op Plan:   Post-operative Plan:   Informed Consent: I have reviewed the patients History and Physical, chart, labs and discussed the procedure including the risks, benefits and alternatives for the proposed anesthesia with the patient or authorized representative who has indicated his/her understanding and acceptance.       Plan Discussed with: CRNA, Anesthesiologist and Surgeon  Anesthesia Plan Comments:         Anesthesia Quick Evaluation

## 2021-06-06 ENCOUNTER — Encounter: Payer: Self-pay | Admitting: Gastroenterology

## 2021-06-06 LAB — SURGICAL PATHOLOGY

## 2021-06-08 ENCOUNTER — Other Ambulatory Visit: Payer: Self-pay | Admitting: Internal Medicine

## 2021-06-08 MED ORDER — ONDANSETRON 4 MG PO TBDP: 4.0000 mg | ORAL_TABLET | Freq: Three times a day (TID) | ORAL | 0 refills | Status: DC | PRN

## 2021-06-08 MED ORDER — RIZATRIPTAN BENZOATE 10 MG PO TBDP: ORAL_TABLET | ORAL | 0 refills | Status: DC

## 2021-06-10 ENCOUNTER — Encounter: Payer: Self-pay | Admitting: Gastroenterology

## 2021-06-23 ENCOUNTER — Telehealth: Payer: Self-pay

## 2021-06-23 NOTE — Telephone Encounter (Signed)
Referral faxed to [facility] surgery

## 2021-07-01 ENCOUNTER — Telehealth: Payer: Self-pay | Admitting: Internal Medicine

## 2021-07-01 NOTE — Telephone Encounter (Signed)
Lft pt vm to call [place] to sch appt for mammo. thanks

## 2021-07-07 ENCOUNTER — Other Ambulatory Visit: Payer: Self-pay

## 2021-07-07 DIAGNOSIS — R109 Unspecified abdominal pain: Secondary | ICD-10-CM

## 2021-07-07 DIAGNOSIS — R112 Nausea with vomiting, unspecified: Secondary | ICD-10-CM

## 2021-07-11 DIAGNOSIS — F3132 Bipolar disorder, current episode depressed, moderate: Secondary | ICD-10-CM | POA: Diagnosis not present

## 2021-07-11 DIAGNOSIS — F3112 Bipolar disorder, current episode manic without psychotic features, moderate: Secondary | ICD-10-CM | POA: Diagnosis not present

## 2021-07-27 ENCOUNTER — Other Ambulatory Visit: Payer: Self-pay | Admitting: Internal Medicine

## 2021-07-27 MED ORDER — RIZATRIPTAN BENZOATE 10 MG PO TBDP
ORAL_TABLET | ORAL | 0 refills | Status: DC
Start: 2021-07-27 — End: 2021-08-22

## 2021-07-27 MED ORDER — ONDANSETRON 4 MG PO TBDP: 4.0000 mg | ORAL_TABLET | Freq: Three times a day (TID) | ORAL | 0 refills | Status: DC | PRN

## 2021-07-28 ENCOUNTER — Ambulatory Visit: Payer: BC Managed Care – PPO | Admitting: Gastroenterology

## 2021-07-28 DIAGNOSIS — K30 Functional dyspepsia: Secondary | ICD-10-CM | POA: Diagnosis not present

## 2021-07-28 DIAGNOSIS — K449 Diaphragmatic hernia without obstruction or gangrene: Secondary | ICD-10-CM | POA: Diagnosis not present

## 2021-07-29 ENCOUNTER — Ambulatory Visit
Admission: RE | Admit: 2021-07-29 | Discharge: 2021-07-29 | Disposition: A | Discharge status: Home / Self Care | Payer: BC Managed Care – PPO | Source: Ambulatory Visit | Attending: Gastroenterology | Admitting: Gastroenterology

## 2021-07-29 ENCOUNTER — Other Ambulatory Visit: Payer: Self-pay

## 2021-07-29 DIAGNOSIS — R109 Unspecified abdominal pain: Secondary | ICD-10-CM | POA: Insufficient documentation

## 2021-07-29 DIAGNOSIS — R112 Nausea with vomiting, unspecified: Secondary | ICD-10-CM | POA: Diagnosis not present

## 2021-08-12 ENCOUNTER — Ambulatory Visit (INDEPENDENT_AMBULATORY_CARE_PROVIDER_SITE_OTHER): Payer: BC Managed Care – PPO | Admitting: Internal Medicine

## 2021-08-12 ENCOUNTER — Other Ambulatory Visit: Payer: Self-pay

## 2021-08-12 VITALS — BP 128/76 | HR 91 | Temp 97.9°F | Resp 16 | Ht 67.0 in | Wt 164.0 lb

## 2021-08-12 DIAGNOSIS — Z8616 Personal history of COVID-19: Secondary | ICD-10-CM

## 2021-08-12 DIAGNOSIS — F419 Anxiety disorder, unspecified: Secondary | ICD-10-CM

## 2021-08-12 DIAGNOSIS — G40909 Epilepsy, unspecified, not intractable, without status epilepticus: Secondary | ICD-10-CM

## 2021-08-12 DIAGNOSIS — E7800 Pure hypercholesterolemia, unspecified: Secondary | ICD-10-CM | POA: Diagnosis not present

## 2021-08-12 DIAGNOSIS — J4541 Moderate persistent asthma with (acute) exacerbation: Secondary | ICD-10-CM | POA: Diagnosis not present

## 2021-08-12 DIAGNOSIS — D72829 Elevated white blood cell count, unspecified: Secondary | ICD-10-CM

## 2021-08-12 DIAGNOSIS — Z13220 Encounter for screening for lipoid disorders: Secondary | ICD-10-CM | POA: Diagnosis not present

## 2021-08-12 DIAGNOSIS — K219 Gastro-esophageal reflux disease without esophagitis: Secondary | ICD-10-CM | POA: Diagnosis not present

## 2021-08-12 DIAGNOSIS — R5383 Other fatigue: Secondary | ICD-10-CM

## 2021-08-12 NOTE — Progress Notes (Signed)
Patient ID: Heather [name], female   DOB: May 25, 1976, 45 y.o.   MRN: [id number]   Subjective:    Patient ID: Heather [name], female    DOB: [birth year], 45 y.o.   MRN: [id number]  This visit occurred during the SARS-CoV-2 public health emergency.  Safety protocols were in place, including screening questions prior to the visit, additional usage of staff PPE, and extensive cleaning of exam room while observing appropriate contact time as indicated for disinfecting solutions.   Patient here for a scheduled follow up.  Chief Complaint  Patient presents with   Nausea   Stress   .   HPI Was initially scheduled for physical.  Changed to f/u appt.  Recently evaluated by gyn.  Had STD screening.  Some family stress.  Discussed.  Seeing Dr Toy Care.  Has valium if needed.  She feels she is doing better overall regarding depression - with post covid issues.  Still with some fatigue.  Her main post covid concern is memory issues and dealing with new information and new things.  Breathing overall stable.  Trelegy.  Using neti pot.  No chest pain reported.  No abdominal pain reported.    Past Medical History:  Diagnosis Date   Asthma    Depression    bipolar   Dysrhythmia    Fainting spell    H/O   H/O cardiac arrhythmia    Migraines    Seizures (HCC)    Past Surgical History:  Procedure Laterality Date   COLONOSCOPY WITH PROPOFOL N/A 06/03/2021   Procedure: COLONOSCOPY WITH PROPOFOL;  Surgeon: Virgel Manifold, MD;  Location: [facility] ENDOSCOPY;  Service: Endoscopy;  Laterality: N/A;   ESOPHAGOGASTRODUODENOSCOPY N/A 06/03/2021   Procedure: ESOPHAGOGASTRODUODENOSCOPY (EGD);  Surgeon: Virgel Manifold, MD;  Location: [facility] ENDOSCOPY;  Service: Endoscopy;  Laterality: N/A;   TUBAL LIGATION Bilateral 09/25/2002   tubal reversal     Family History  Problem Relation Age of Onset   Hypertension Mother    Diabetes Mother    Arthritis Mother    Heart disease Mother    Breast cancer Neg Hx    Social  History   Socioeconomic History   Marital status: Married    Spouse name: Not on file   Number of children: Not on file   Years of education: Not on file   Highest education level: Not on file  Occupational History   Not on file  Tobacco Use   Smoking status: Former    Packs/day: 0.50    Years: 5.00    Pack years: 2.50    Types: Cigarettes   Smokeless tobacco: Never   Tobacco comments:    Quit mid October 2014  Vaping Use   Vaping Use: Never used  Substance and Sexual Activity   Alcohol use: No    Alcohol/week: 0.0 standard drinks   Drug use: No   Sexual activity: Not on file  Other Topics Concern   Not on file  Social History Narrative   Not on file   Social Determinants of Health   Financial Resource Strain: Not on file  Food Insecurity: Not on file  Transportation Needs: Not on file  Physical Activity: Not on file  Stress: Not on file  Social Connections: Not on file     Review of Systems  Constitutional:  Negative for appetite change and unexpected weight change.  HENT:  Negative for congestion and sinus pressure.   Respiratory:  Negative for cough and chest tightness.  Breathing stable.   Cardiovascular:  Negative for chest pain, palpitations and leg swelling.  Gastrointestinal:  Negative for abdominal pain, diarrhea and vomiting.  Genitourinary:  Negative for difficulty urinating and dysuria.  Musculoskeletal:  Negative for joint swelling and myalgias.  Skin:  Negative for color change and rash.  Neurological:  Negative for dizziness, light-headedness and headaches.  Psychiatric/Behavioral:  Negative for agitation and dysphoric mood.        Increased stress dealing with post covid issues.        Objective:     BP 128/76   Pulse 91   Temp 97.9 F (36.6 C)   Resp 16   Ht 5\' 7"  (1.702 m)   Wt 164 lb (74.4 kg)   SpO2 98%   BMI 25.69 kg/m  Wt Readings from Last 3 Encounters:  08/12/21 164 lb (74.4 kg)  06/03/21 172 lb (78 kg)  06/01/21  173 lb 3.2 oz (78.6 kg)    Physical Exam Vitals reviewed.  Constitutional:      General: She is not in acute distress.    Appearance: Normal appearance.  HENT:     Head: Normocephalic and atraumatic.     Right Ear: External ear normal.     Left Ear: External ear normal.  Eyes:     General: No scleral icterus.       Right eye: No discharge.        Left eye: No discharge.     Conjunctiva/sclera: Conjunctivae normal.  Neck:     Thyroid: No thyromegaly.  Cardiovascular:     Rate and Rhythm: Normal rate and regular rhythm.  Pulmonary:     Effort: No respiratory distress.     Breath sounds: Normal breath sounds. No wheezing.  Abdominal:     General: Bowel sounds are normal.     Palpations: Abdomen is soft.     Tenderness: There is no abdominal tenderness.  Musculoskeletal:        General: No swelling or tenderness.     Cervical back: Neck supple. No tenderness.  Lymphadenopathy:     Cervical: No cervical adenopathy.  Skin:    Findings: No erythema or rash.  Neurological:     Mental Status: [name].  Psychiatric:        Mood and Affect: Mood normal.        Behavior: Behavior normal.     Outpatient Encounter Medications as of 08/12/2021  Medication Sig   albuterol (VENTOLIN HFA) 108 (90 Base) MCG/ACT inhaler Inhale 1-2 puffs into the lungs every 4 (four) hours as needed for wheezing or shortness of breath.   cetirizine (ZYRTEC ALLERGY) 10 MG tablet Take 1 tablet (10 mg total) by mouth daily.   dextromethorphan-guaiFENesin (MUCINEX DM) 30-600 MG 12hr tablet Take 1 tablet by mouth 2 (two) times daily.   diazepam (VALIUM) 10 MG tablet Take 10 mg by mouth 4 (four) times daily as needed.   esomeprazole (NEXIUM) 10 MG packet Take 20 mg by mouth daily for 56 doses. Take 30-45 mins before breakfast   fluticasone (FLONASE) 50 MCG/ACT nasal spray Place 1 spray into both nostrils daily.   guaifenesin (ROBITUSSIN) 100 MG/5ML syrup Take 200 mg by mouth 3 (three) times daily as  needed for cough.   ipratropium-albuterol (DUONEB) 0.5-2.5 (3) MG/3ML SOLN Inhale 3 mLs into the lungs every 4 (four) hours as needed.   magnesium oxide (MAG-OX) 400 (240 Mg) MG tablet Take 1 tablet (400 mg total) by mouth daily.  montelukast (SINGULAIR) 10 MG tablet Take 1 tablet (10 mg total) by mouth at bedtime.   ondansetron (ZOFRAN-ODT) 4 MG disintegrating tablet Take 1 tablet (4 mg total) by mouth every 8 (eight) hours as needed for nausea or vomiting.   predniSONE (DELTASONE) 10 MG tablet 4 tabs for 2 days, then 3 tabs for 2 days, 2 tabs for 2 days, then 1 tab for 2 days, then stop   rizatriptan (MAXALT-MLT) 10 MG disintegrating tablet TAKE ONE TABLET BY MOUTH ONCE AS NEEDED FOR ONSET OF MIGRAINE. MAY TAKE A SECOND DOSE AFTER TWO HOURS IF NEEDED   TRELEGY ELLIPTA 200-62.5-25 MCG/INH AEPB INHALE 1 PUFF ONCE DAILY   No facility-administered encounter medications on file as of 08/12/2021.     Lab Results  Component Value Date   WBC 10.9 (H) 05/18/2021   HGB 13.0 05/18/2021   HCT 39.6 05/18/2021   PLT 231.0 05/18/2021   GLUCOSE 75 05/18/2021   CHOL 170 05/18/2021   TRIG 105.0 05/18/2021   HDL 39.10 05/18/2021   LDLDIRECT 127.0 09/29/2020   LDLCALC 110 (H) 05/18/2021   ALT 19 05/18/2021   AST 15 05/18/2021   NA 136 05/18/2021   K 4.1 05/18/2021   CL 105 05/18/2021   CREATININE 0.83 05/18/2021   BUN 8 05/18/2021   CO2 23 05/18/2021   TSH 2.07 02/04/2021    NM GASTRIC EMPTYING  Result Date: 07/29/2021 CLINICAL DATA:  Nausea vomiting, concern for gastroparesis. EXAM: NUCLEAR MEDICINE GASTRIC EMPTYING SCAN TECHNIQUE: After oral ingestion of radiolabeled meal, sequential abdominal images were obtained for 120 minutes. Residual percentage of activity remaining within the stomach was calculated at 60 and 120 minutes. RADIOPHARMACEUTICALS:  2.1 mCi Tc-40m sulfur colloid in standardized meal COMPARISON:  Ultrasound March 29, 2021 FINDINGS: Expected location of the stomach in the left  upper quadrant. Ingested meal empties the stomach gradually over the course of the study with 20% retention at 60 min and 0% retention at 120 min (normal retention less than 30% at a 120 min). IMPRESSION: Normal gastric emptying study. Electronically Signed   By: Dahlia Bailiff M.D.   On: 07/29/2021 23:41       Assessment & Plan:   Problem List Items Addressed This Visit     Anxiety    Seeing Dr Toy Care.  Has valium if needed.  Follow.        Relevant Orders   Ambulatory referral to Physical Medicine Rehab   Asthma    Continue trelegy.  Breathing overall stable.       Fatigue    Appears to be some improved.  Breathing improved.  Follow.       Relevant Orders   Ambulatory referral to Physical Medicine Rehab   GERD (gastroesophageal reflux disease)   Relevant Orders   CBC with Differential/Platelet   Basic metabolic panel   TSH   History of COVID-19    Diagnosed 05/2020.  Received monoclonal antibody infusion.  Still with altered taste and smell.  Still with increased fatigue.  Increased anxiety related. Seeing her chiropractor.  Has valium now if needed.  Follow. Concern regarding her memory.  Discussed long haulers - covid.  Interested in [place] covid clinic.        Relevant Orders   Ambulatory referral to Physical Medicine Rehab   Hypercholesterolemia - Primary    Low cholesterol diet and exercise.  Follow lipid panel.       Relevant Orders   CBC with Differential/Platelet   Basic metabolic panel  Hepatic function panel   Lipid panel   TSH   Leukocytosis    Follow cbc.       Seizure disorder [facility])    Has been followed by neurology.  Stable.       Other Visit Diagnoses     Screening cholesterol level            Einar Pheasant, MD

## 2021-08-20 ENCOUNTER — Encounter: Payer: Self-pay | Admitting: Internal Medicine

## 2021-08-20 NOTE — Assessment & Plan Note (Signed)
Low cholesterol diet and exercise.  Follow lipid panel.   

## 2021-08-20 NOTE — Assessment & Plan Note (Signed)
Seeing Dr Toy Care.  Has valium if needed.  Follow.

## 2021-08-20 NOTE — Assessment & Plan Note (Signed)
Continue trelegy.  Breathing overall stable.

## 2021-08-20 NOTE — Assessment & Plan Note (Signed)
Appears to be some improved.  Breathing improved.  Follow.

## 2021-08-20 NOTE — Assessment & Plan Note (Signed)
Diagnosed 05/2020.  Received monoclonal antibody infusion.  Still with altered taste and smell.  Still with increased fatigue.  Increased anxiety related. Seeing her chiropractor.  Has valium now if needed.  Follow. Concern regarding her memory.  Discussed long haulers - covid.  Interested in [place] covid clinic.

## 2021-08-20 NOTE — Assessment & Plan Note (Signed)
Follow cbc.  

## 2021-08-20 NOTE — Assessment & Plan Note (Signed)
Has been followed by neurology.  Stable.  

## 2021-08-21 ENCOUNTER — Other Ambulatory Visit: Payer: Self-pay | Admitting: Internal Medicine

## 2021-08-29 ENCOUNTER — Other Ambulatory Visit (INDEPENDENT_AMBULATORY_CARE_PROVIDER_SITE_OTHER): Payer: BC Managed Care – PPO

## 2021-08-29 ENCOUNTER — Other Ambulatory Visit: Payer: Self-pay

## 2021-08-29 DIAGNOSIS — E7800 Pure hypercholesterolemia, unspecified: Secondary | ICD-10-CM

## 2021-08-29 DIAGNOSIS — K219 Gastro-esophageal reflux disease without esophagitis: Secondary | ICD-10-CM

## 2021-08-29 LAB — CBC WITH DIFFERENTIAL/PLATELET
Basophils Absolute: 0.1 10*3/uL (ref 0.0–0.1)
Basophils Relative: 1 % (ref 0.0–3.0)
Eosinophils Absolute: 0.2 10*3/uL (ref 0.0–0.7)
Eosinophils Relative: 1.7 % (ref 0.0–5.0)
HCT: 40 % (ref 36.0–46.0)
Hemoglobin: 13 g/dL (ref 12.0–15.0)
Lymphocytes Relative: 21 % (ref 12.0–46.0)
Lymphs Abs: 2.5 10*3/uL (ref 0.7–4.0)
MCHC: 32.5 g/dL (ref 30.0–36.0)
MCV: 88.1 fl (ref 78.0–100.0)
Monocytes Absolute: 0.7 10*3/uL (ref 0.1–1.0)
Monocytes Relative: 6.3 % (ref 3.0–12.0)
Neutro Abs: 8.3 10*3/uL — ABNORMAL HIGH (ref 1.4–7.7)
Neutrophils Relative %: 70 % (ref 43.0–77.0)
Platelets: 233 10*3/uL (ref 150.0–400.0)
RBC: 4.55 Mil/uL (ref 3.87–5.11)
RDW: 14 % (ref 11.5–15.5)
WBC: 11.9 10*3/uL — ABNORMAL HIGH (ref 4.0–10.5)

## 2021-08-29 LAB — HEPATIC FUNCTION PANEL
ALT: 15 U/L (ref 0–35)
AST: 15 U/L (ref 0–37)
Albumin: 4.1 g/dL (ref 3.5–5.2)
Alkaline Phosphatase: 80 U/L (ref 39–117)
Bilirubin, Direct: 0.1 mg/dL (ref 0.0–0.3)
Total Bilirubin: 0.5 mg/dL (ref 0.2–1.2)
Total Protein: 6.4 g/dL (ref 6.0–8.3)

## 2021-08-29 LAB — BASIC METABOLIC PANEL
BUN: 9 mg/dL (ref 6–23)
CO2: 24 mEq/L (ref 19–32)
Calcium: 9 mg/dL (ref 8.4–10.5)
Chloride: 105 mEq/L (ref 96–112)
Creatinine, Ser: 0.82 mg/dL (ref 0.40–1.20)
GFR: 85.86 mL/min (ref >60.00)
Glucose, Bld: 78 mg/dL (ref 70–99)
Potassium: 3.7 mEq/L (ref 3.5–5.1)
Sodium: 136 mEq/L (ref 135–145)

## 2021-08-29 LAB — LIPID PANEL
Cholesterol: 206 mg/dL — ABNORMAL HIGH (ref 0–200)
HDL: 39.2 mg/dL (ref >39.00)
LDL Cholesterol: 134 mg/dL — ABNORMAL HIGH (ref 0–99)
NonHDL: 166.93
Total CHOL/HDL Ratio: 5
Triglycerides: 164 mg/dL — ABNORMAL HIGH (ref 0.0–149.0)
VLDL: 32.8 mg/dL (ref 0.0–40.0)

## 2021-08-29 LAB — TSH: TSH: 1.47 u[IU]/mL (ref 0.35–5.50)

## 2021-09-20 ENCOUNTER — Other Ambulatory Visit: Payer: Self-pay | Admitting: Internal Medicine

## 2021-09-21 ENCOUNTER — Ambulatory Visit: Payer: BC Managed Care – PPO | Admitting: Gastroenterology

## 2021-09-21 MED ORDER — RIZATRIPTAN BENZOATE 10 MG PO TBDP: ORAL_TABLET | ORAL | 0 refills | Status: DC

## 2021-09-21 MED ORDER — ONDANSETRON 4 MG PO TBDP: 4.0000 mg | ORAL_TABLET | Freq: Three times a day (TID) | ORAL | 0 refills | Status: DC | PRN

## 2021-09-23 ENCOUNTER — Other Ambulatory Visit: Payer: Self-pay | Admitting: Internal Medicine

## 2021-09-23 MED ORDER — RIZATRIPTAN BENZOATE 10 MG PO TBDP: ORAL_TABLET | ORAL | 0 refills | Status: DC

## 2021-10-27 ENCOUNTER — Ambulatory Visit: Payer: BC Managed Care – PPO | Admitting: Gastroenterology

## 2021-11-03 ENCOUNTER — Encounter: Payer: Self-pay | Admitting: Internal Medicine

## 2021-11-03 ENCOUNTER — Other Ambulatory Visit: Payer: Self-pay

## 2021-11-03 ENCOUNTER — Other Ambulatory Visit (HOSPITAL_COMMUNITY)
Admission: RE | Admit: 2021-11-03 | Discharge: 2021-11-03 | Disposition: A | Discharge status: Home / Self Care | Payer: BC Managed Care – PPO | Source: Ambulatory Visit | Attending: Internal Medicine | Admitting: Internal Medicine

## 2021-11-03 ENCOUNTER — Ambulatory Visit (INDEPENDENT_AMBULATORY_CARE_PROVIDER_SITE_OTHER): Payer: BC Managed Care – PPO | Admitting: Internal Medicine

## 2021-11-03 VITALS — BP 130/70 | HR 90 | Temp 97.9°F | Resp 16 | Ht 66.5 in | Wt 166.4 lb

## 2021-11-03 DIAGNOSIS — J4541 Moderate persistent asthma with (acute) exacerbation: Secondary | ICD-10-CM | POA: Diagnosis not present

## 2021-11-03 DIAGNOSIS — Z8616 Personal history of COVID-19: Secondary | ICD-10-CM

## 2021-11-03 DIAGNOSIS — D72829 Elevated white blood cell count, unspecified: Secondary | ICD-10-CM

## 2021-11-03 DIAGNOSIS — G40909 Epilepsy, unspecified, not intractable, without status epilepticus: Secondary | ICD-10-CM | POA: Diagnosis not present

## 2021-11-03 DIAGNOSIS — R5383 Other fatigue: Secondary | ICD-10-CM

## 2021-11-03 DIAGNOSIS — R110 Nausea: Secondary | ICD-10-CM

## 2021-11-03 DIAGNOSIS — Z124 Encounter for screening for malignant neoplasm of cervix: Secondary | ICD-10-CM | POA: Insufficient documentation

## 2021-11-03 DIAGNOSIS — E7800 Pure hypercholesterolemia, unspecified: Secondary | ICD-10-CM

## 2021-11-03 DIAGNOSIS — F419 Anxiety disorder, unspecified: Secondary | ICD-10-CM

## 2021-11-03 DIAGNOSIS — Z0000 Encounter for general adult medical examination without abnormal findings: Secondary | ICD-10-CM

## 2021-11-03 MED ORDER — AZITHROMYCIN 250 MG PO TABS: ORAL_TABLET | ORAL | 0 refills | Status: AC

## 2021-11-03 MED ORDER — PREDNISONE 10 MG PO TABS: ORAL_TABLET | ORAL | 0 refills | Status: DC

## 2021-11-03 NOTE — Assessment & Plan Note (Signed)
Physical today 2/89/23.  Colonoscopy 06/03/21 - recommended f/u in one year.  Mammogram - needs to be scheduled.

## 2021-11-03 NOTE — Progress Notes (Signed)
Patient ID: Heather [name], female   DOB: 1976-03-15, 46 y.o.   MRN: [id number]   Subjective:    Patient ID: Heather [name], female    DOB: 1975/11/29, 46 y.o.   MRN: [id number]  This visit occurred during the SARS-CoV-2 public health emergency.  Safety protocols were in place, including screening questions prior to the visit, additional usage of staff PPE, and extensive cleaning of exam room while observing appropriate contact time as indicated for disinfecting solutions.   Patient here for her physical exam.    Chief Complaint  Patient presents with   Annual Exam   .   HPI Reports over this past week - asthma flare.  Using breathing treatments.  Worse when gets up/lying down.  Taking mucinex/robitussin.  Reports increased mucus - clear/yellow.  Cough.  Chest congestion. Using trelegy regularly.  Also using nebs and rescue inhaler - daily. Is planning to follow up with [facility] covid clinic.  Since covid - persistent fatigue, brain fog, nausea (zofran helping).  Still with altered taste and smell.  No chest pain.  Eating.  No vomiting reported.  Appears to be handling stress.     Past Medical History:  Diagnosis Date   Asthma    Depression    bipolar   Dysrhythmia    Fainting spell    H/O   H/O cardiac arrhythmia    Migraines    Seizures (HCC)    Past Surgical History:  Procedure Laterality Date   COLONOSCOPY WITH PROPOFOL N/A 06/03/2021   Procedure: COLONOSCOPY WITH PROPOFOL;  Surgeon: Virgel Manifold, MD;  Location: [facility] ENDOSCOPY;  Service: Endoscopy;  Laterality: N/A;   ESOPHAGOGASTRODUODENOSCOPY N/A 06/03/2021   Procedure: ESOPHAGOGASTRODUODENOSCOPY (EGD);  Surgeon: Virgel Manifold, MD;  Location: [facility] ENDOSCOPY;  Service: Endoscopy;  Laterality: N/A;   TUBAL LIGATION Bilateral 09/25/2002   tubal reversal     Family History  Problem Relation Age of Onset   Hypertension Mother    Diabetes Mother    Arthritis Mother    Heart disease Mother    Breast cancer Neg Hx     Social History   Socioeconomic History   Marital status: Married    Spouse name: Not on file   Number of children: Not on file   Years of education: Not on file   Highest education level: Not on file  Occupational History   Not on file  Tobacco Use   Smoking status: Former    Packs/day: 0.50    Years: 5.00    Pack years: 2.50    Types: Cigarettes   Smokeless tobacco: Never   Tobacco comments:    Quit mid October 2014  Vaping Use   Vaping Use: Never used  Substance and Sexual Activity   Alcohol use: No    Alcohol/week: 0.0 standard drinks   Drug use: No   Sexual activity: Not on file  Other Topics Concern   Not on file  Social History Narrative   Not on file   Social Determinants of Health   Financial Resource Strain: Not on file  Food Insecurity: Not on file  Transportation Needs: Not on file  Physical Activity: Not on file  Stress: Not on file  Social Connections: Not on file     Review of Systems  Constitutional:  Positive for fatigue. Negative for appetite change and unexpected weight change.  HENT:  Positive for congestion. Negative for sinus pressure.   Respiratory:  Positive for cough. Negative for chest tightness and  shortness of breath.        Chest congestion.   Cardiovascular:  Negative for chest pain, palpitations and leg swelling.  Gastrointestinal:  Positive for nausea. Negative for abdominal pain, diarrhea and vomiting.  Genitourinary:  Negative for difficulty urinating and dysuria.  Musculoskeletal:  Negative for joint swelling and myalgias.  Skin:  Negative for color change and rash.  Neurological:  Negative for dizziness, light-headedness and headaches.  Psychiatric/Behavioral:  Negative for agitation and dysphoric mood.       Objective:     BP 130/70    Pulse 90    Temp 97.9 F (36.6 C)    Resp 16    Ht 5' 6.5" (1.689 m)    Wt 166 lb 6.4 oz (75.5 kg)    SpO2 99%    BMI 26.46 kg/m  Wt Readings from Last 3 Encounters:  11/03/21 166 lb  6.4 oz (75.5 kg)  08/12/21 164 lb (74.4 kg)  06/03/21 172 lb (78 kg)    Physical Exam Vitals reviewed.  Constitutional:      General: She is not in acute distress.    Appearance: Normal appearance. She is well-developed.  HENT:     Head: Normocephalic and atraumatic.     Right Ear: External ear normal.     Left Ear: External ear normal.  Eyes:     General: No scleral icterus.       Right eye: No discharge.        Left eye: No discharge.     Conjunctiva/sclera: Conjunctivae normal.  Neck:     Thyroid: No thyromegaly.  Cardiovascular:     Rate and Rhythm: Normal rate and regular rhythm.  Pulmonary:     Effort: No tachypnea, accessory muscle usage or respiratory distress.     Breath sounds: Normal breath sounds. No decreased breath sounds or wheezing.     Comments: Some increased cough with expiration.  Chest:  Breasts:    Right: No inverted nipple, mass, nipple discharge or tenderness (no axillary adenopathy).     Left: No inverted nipple, mass, nipple discharge or tenderness (no axilarry adenopathy).  Abdominal:     General: Bowel sounds are normal.     Palpations: Abdomen is soft.     Tenderness: There is no abdominal tenderness.  Genitourinary:    Comments: Normal external genitalia.  Vaginal vault without lesions.  Cervix identified.  Pap smear performed.  Could not appreciate any adnexal masses or tenderness.   Musculoskeletal:        General: No swelling or tenderness.     Cervical back: Neck supple.  Lymphadenopathy:     Cervical: No cervical adenopathy.  Skin:    Findings: No erythema or rash.  Neurological:     Mental Status: [name] and oriented to person, place, and time.  Psychiatric:        Mood and Affect: Mood normal.        Behavior: Behavior normal.     Outpatient Encounter Medications as of 11/03/2021  Medication Sig   [EXPIRED] azithromycin (ZITHROMAX) 250 MG tablet Take 2 tablets on day 1, then 1 tablet daily on days 2 through 5   albuterol  (VENTOLIN HFA) 108 (90 Base) MCG/ACT inhaler Inhale 1-2 puffs into the lungs every 4 (four) hours as needed for wheezing or shortness of breath.   cetirizine (ZYRTEC ALLERGY) 10 MG tablet Take 1 tablet (10 mg total) by mouth daily.   dextromethorphan-guaiFENesin (MUCINEX DM) 30-600 MG 12hr tablet Take  1 tablet by mouth 2 (two) times daily.   diazepam (VALIUM) 10 MG tablet Take 10 mg by mouth 4 (four) times daily as needed.   esomeprazole (NEXIUM) 10 MG packet Take 20 mg by mouth daily for 56 doses. Take 30-45 mins before breakfast   fluticasone (FLONASE) 50 MCG/ACT nasal spray Place 1 spray into both nostrils daily.   guaifenesin (ROBITUSSIN) 100 MG/5ML syrup Take 200 mg by mouth 3 (three) times daily as needed for cough.   ipratropium-albuterol (DUONEB) 0.5-2.5 (3) MG/3ML SOLN Inhale 3 mLs into the lungs every 4 (four) hours as needed.   magnesium oxide (MAG-OX) 400 (240 Mg) MG tablet Take 1 tablet (400 mg total) by mouth daily.   ondansetron (ZOFRAN-ODT) 4 MG disintegrating tablet Take 1 tablet (4 mg total) by mouth every 8 (eight) hours as needed for nausea or vomiting.   rizatriptan (MAXALT-MLT) 10 MG disintegrating tablet TAKE 1 TABLET BY MOUTH AT ONSET OF MIGRAINE - MAY TAKE A SECOND DOSE AFTER 2 HOURS IF NEEDED   TRELEGY ELLIPTA 200-62.5-25 MCG/INH AEPB INHALE 1 PUFF ONCE DAILY   [DISCONTINUED] montelukast (SINGULAIR) 10 MG tablet Take 1 tablet (10 mg total) by mouth at bedtime.   [DISCONTINUED] predniSONE (DELTASONE) 10 MG tablet 4 tabs for 2 days, then 3 tabs for 2 days, 2 tabs for 2 days, then 1 tab for 2 days, then stop   [DISCONTINUED] predniSONE (DELTASONE) 10 MG tablet Take 6 tablets x  day and then decrease by 1/2 tablet per day until down to zero mg.   No facility-administered encounter medications on file as of 11/03/2021.     Lab Results  Component Value Date   WBC 11.9 (H) 08/29/2021   HGB 13.0 08/29/2021   HCT 40.0 08/29/2021   PLT 233.0 08/29/2021   GLUCOSE 78 08/29/2021    CHOL 206 (H) 08/29/2021   TRIG 164.0 (H) 08/29/2021   HDL 39.20 08/29/2021   LDLDIRECT 127.0 09/29/2020   LDLCALC 134 (H) 08/29/2021   ALT 15 08/29/2021   AST 15 08/29/2021   NA 136 08/29/2021   K 3.7 08/29/2021   CL 105 08/29/2021   CREATININE 0.82 08/29/2021   BUN 9 08/29/2021   CO2 24 08/29/2021   TSH 1.47 08/29/2021    NM GASTRIC EMPTYING  Result Date: 07/29/2021 CLINICAL DATA:  Nausea vomiting, concern for gastroparesis. EXAM: NUCLEAR MEDICINE GASTRIC EMPTYING SCAN TECHNIQUE: After oral ingestion of radiolabeled meal, sequential abdominal images were obtained for 120 minutes. Residual percentage of activity remaining within the stomach was calculated at 60 and 120 minutes. RADIOPHARMACEUTICALS:  2.1 mCi Tc-73m sulfur colloid in standardized meal COMPARISON:  Ultrasound March 29, 2021 FINDINGS: Expected location of the stomach in the left upper quadrant. Ingested meal empties the stomach gradually over the course of the study with 20% retention at 60 min and 0% retention at 120 min (normal retention less than 30% at a 120 min). IMPRESSION: Normal gastric emptying study. Electronically Signed   By: Dahlia Bailiff M.D.   On: 07/29/2021 23:41       Assessment & Plan:   Problem List Items Addressed This Visit     Anxiety    Seeing Dr Toy Care.  Has valium if needed.  Follow.        Asthmatic bronchitis with acute exacerbation    Increased congestion and cough as outlined.  Continue trelegy.  Continue nebs and albuterol inhaler as needed.  Prednisone taper as directed.  Follow closely.  Call with update.  Fatigue    Persistent since covid.  Planned appt in covid clinic.        Health care maintenance    Physical today 2/89/23.  Colonoscopy 06/03/21 - recommended f/u in one year.  Mammogram - needs to be scheduled.        History of COVID-19    Diagnosed 05/2020.  Received monoclonal antibody infusion.  Still with altered taste and smell.  Still with increased fatigue.   Increased anxiety related.  Brain fog. Has appt with [facility] covid clinic in 11/2021 for evaluation.         Hypercholesterolemia    Low cholesterol diet and exercise.  Follow lipid panel.       Leukocytosis    Follow cbc.       Nausea    Taking zofran prn.  Follow.       Seizure disorder [facility])    Has been followed by neurology.  Stable.       Other Visit Diagnoses     Cervical cancer screening    -  Primary   Relevant Orders   Cytology - PAP( [place]) (Completed)        Einar Pheasant, MD

## 2021-11-04 ENCOUNTER — Telehealth: Payer: Self-pay | Admitting: Internal Medicine

## 2021-11-04 ENCOUNTER — Other Ambulatory Visit: Payer: Self-pay

## 2021-11-04 MED ORDER — PREDNISONE 10 MG PO TABS: ORAL_TABLET | ORAL | 0 refills | Status: DC

## 2021-11-04 NOTE — Telephone Encounter (Signed)
Patient saw Dr Nicki Reaper yesterday. Patient called today and said her pharmacy needs clarification on the predniSONE (DELTASONE) 10 MG tablet. [facility] [address].

## 2021-11-04 NOTE — Telephone Encounter (Signed)
Corrected directions on rx and resent to pharmacy

## 2021-11-07 LAB — CYTOLOGY - PAP
Comment: NEGATIVE
High risk HPV: NEGATIVE

## 2021-11-08 ENCOUNTER — Other Ambulatory Visit: Payer: Self-pay

## 2021-11-08 DIAGNOSIS — R87619 Unspecified abnormal cytological findings in specimens from cervix uteri: Secondary | ICD-10-CM

## 2021-11-12 ENCOUNTER — Encounter: Payer: Self-pay | Admitting: Internal Medicine

## 2021-11-12 NOTE — Assessment & Plan Note (Signed)
Diagnosed 05/2020.  Received monoclonal antibody infusion.  Still with altered taste and smell.  Still with increased fatigue.  Increased anxiety related.  Brain fog. Has appt with [facility] covid clinic in 11/2021 for evaluation.

## 2021-11-12 NOTE — Assessment & Plan Note (Signed)
Low cholesterol diet and exercise.  Follow lipid panel.   

## 2021-11-12 NOTE — Assessment & Plan Note (Signed)
Taking zofran prn.  Follow.

## 2021-11-12 NOTE — Assessment & Plan Note (Signed)
Has been followed by neurology.  Stable.  

## 2021-11-12 NOTE — Assessment & Plan Note (Signed)
Follow cbc.  

## 2021-11-12 NOTE — Assessment & Plan Note (Signed)
Seeing Dr Toy Care.  Has valium if needed.  Follow.

## 2021-11-12 NOTE — Assessment & Plan Note (Signed)
Increased congestion and cough as outlined.  Continue trelegy.  Continue nebs and albuterol inhaler as needed.  Prednisone taper as directed.  Follow closely.  Call with update.   

## 2021-11-12 NOTE — Assessment & Plan Note (Signed)
Persistent since covid.  Planned appt in covid clinic.

## 2021-11-14 DIAGNOSIS — F3176 Bipolar disorder, in full remission, most recent episode depressed: Secondary | ICD-10-CM | POA: Diagnosis not present

## 2021-11-14 DIAGNOSIS — F410 Panic disorder [episodic paroxysmal anxiety] without agoraphobia: Secondary | ICD-10-CM | POA: Diagnosis not present

## 2021-11-14 DIAGNOSIS — F3174 Bipolar disorder, in full remission, most recent episode manic: Secondary | ICD-10-CM | POA: Diagnosis not present

## 2021-12-05 DIAGNOSIS — R4189 Other symptoms and signs involving cognitive functions and awareness: Secondary | ICD-10-CM | POA: Diagnosis not present

## 2021-12-05 DIAGNOSIS — G43909 Migraine, unspecified, not intractable, without status migrainosus: Secondary | ICD-10-CM | POA: Diagnosis not present

## 2021-12-05 DIAGNOSIS — G9339 Other post infection and related fatigue syndromes: Secondary | ICD-10-CM | POA: Diagnosis not present

## 2021-12-05 DIAGNOSIS — U099 Post covid-19 condition, unspecified: Secondary | ICD-10-CM | POA: Diagnosis not present

## 2021-12-19 ENCOUNTER — Other Ambulatory Visit: Payer: Self-pay | Admitting: Internal Medicine

## 2021-12-21 ENCOUNTER — Other Ambulatory Visit: Payer: Self-pay | Admitting: Internal Medicine

## 2021-12-22 ENCOUNTER — Other Ambulatory Visit: Payer: Self-pay | Admitting: Internal Medicine

## 2021-12-23 ENCOUNTER — Other Ambulatory Visit: Payer: Self-pay | Admitting: Internal Medicine

## 2021-12-23 MED ORDER — ONDANSETRON 4 MG PO TBDP: 4.0000 mg | ORAL_TABLET | Freq: Three times a day (TID) | ORAL | 0 refills | Status: DC | PRN

## 2021-12-23 MED ORDER — RIZATRIPTAN BENZOATE 10 MG PO TBDP: ORAL_TABLET | ORAL | 0 refills | Status: DC

## 2021-12-30 ENCOUNTER — Encounter: Payer: Self-pay | Admitting: Obstetrics and Gynecology

## 2022-01-05 ENCOUNTER — Ambulatory Visit: Payer: BC Managed Care – PPO | Admitting: Gastroenterology

## 2022-01-05 ENCOUNTER — Encounter: Payer: Self-pay | Admitting: Gastroenterology

## 2022-01-05 ENCOUNTER — Other Ambulatory Visit: Payer: Self-pay

## 2022-01-05 ENCOUNTER — Encounter: Payer: Self-pay | Admitting: Internal Medicine

## 2022-01-05 VITALS — BP 143/92 | HR 97 | Temp 98.0°F | Ht 66.5 in | Wt 172.2 lb

## 2022-01-05 DIAGNOSIS — K2210 Ulcer of esophagus without bleeding: Secondary | ICD-10-CM | POA: Diagnosis not present

## 2022-01-05 DIAGNOSIS — R112 Nausea with vomiting, unspecified: Secondary | ICD-10-CM

## 2022-01-05 DIAGNOSIS — K5900 Constipation, unspecified: Secondary | ICD-10-CM | POA: Insufficient documentation

## 2022-01-05 DIAGNOSIS — K581 Irritable bowel syndrome with constipation: Secondary | ICD-10-CM

## 2022-01-05 MED ORDER — OMEPRAZOLE 40 MG PO CPDR: 40.0000 mg | DELAYED_RELEASE_CAPSULE | Freq: Every day | ORAL | 3 refills | Status: DC

## 2022-01-05 NOTE — Progress Notes (Signed)
?  ?Cephas Darby, MD ?[address]  ?[address]  ?[place], [place] [address]  ?Main: [phone]  ?Fax: [phone] ? ? ? ?Gastroenterology Consultation ? ?Referring Provider:     Einar Pheasant, MD ?Primary Care Physician:  Einar Pheasant, MD ?Primary Gastroenterologist:  Dr. Bonna Gains ?Reason for Consultation: Chronic constipation, epigastric pain, nausea ?      ? HPI:   ?Heather [name] is a 46 y.o. female referred by Dr. Einar Pheasant, MD  for consultation & management of chronic constipation, epigastric pain and nausea.  Patient has history of chronic constipation which is severe, has bowel movement about once a week associated with significant straining, describes it as impacted stool followed by excruciating abdominal pain, disimpaction followed by overflow diarrhea.  Patient also reports lower abdominal cramps in addition to these painful episodes associated with severe constipation.  She also has epigastric pain associated with nausea.  She underwent upper endoscopy which was unremarkable except for mild erosive esophagitis.  Apparently, patient is taking over-the-counter omeprazole 20 mg twice daily which helps with reflux.  Ultrasound abdomen did not reveal cholelithiasis.  Colonoscopy did not reveal any space-occupying lesions or inflammation.  It was a fair prep ? ?NSAIDs: None ? ?Antiplts/Anticoagulants/Anti thrombotics: None ? ?GI Procedures:  ?EGD and colonoscopy 06/03/2021 ?- LA Grade A reflux esophagitis with no bleeding. ?- White nummular lesions in esophageal mucosa. Brushings performed. ?- Small hiatal hernia. ?- A medium amount of food (residue) in the stomach. ?- Mucosal changes in the duodenum. Biopsied. ?- Biopsies were obtained in the gastric body, at the incisura and in the gastric antrum. ? ?- Preparation of the colon was poor. ?- One 4 mm polyp in the rectum, removed with a cold biopsy forceps. Resected and retrieved. ?- The examination was otherwise normal. ?- The rectum,  sigmoid colon, descending colon, transverse colon, ascending colon and cecum are normal. ?- Non-bleeding internal hemorrhoids. ?- Anal papilla(e) were hypertrophied. ? ?DIAGNOSIS:  ?A. DUODENUM; COLD BIOPSY:  ?- ENTERIC MUCOSA WITH PRESERVED VILLOUS ARCHITECTURE AND NO SIGNIFICANT  ?HISTOPATHOLOGIC CHANGE.  ?- NEGATIVE FOR FEATURES OF CELIAC, DYSPLASIA, AND MALIGNANCY.  ? ?B. STOMACH; COLD BIOPSY:  ?- GASTRIC ANTRAL AND OXYNTIC MUCOSA WITH NO SIGNIFICANT HISTOPATHOLOGIC  ?CHANGE.  ?- NEGATIVE FOR H. PYLORI, DYSPLASIA, AND MALIGNANCY.  ? ?C. RECTAL POLYP; COLD BIOPSY:  ?- HYPERPLASTIC POLYP.  ?- NEGATIVE FOR DYSPLASIA AND MALIGNANCY.  ? ?Past Medical History:  ?Diagnosis Date  ? Asthma   ? Depression   ? bipolar  ? Dysrhythmia   ? Fainting spell   ? H/O  ? H/O cardiac arrhythmia   ? Migraines   ? Seizures ([place])   ? ? ?Past Surgical History:  ?Procedure Laterality Date  ? COLONOSCOPY WITH PROPOFOL N/A 06/03/2021  ? Procedure: COLONOSCOPY WITH PROPOFOL;  Surgeon: Virgel Manifold, MD;  Location: [facility] ENDOSCOPY;  Service: Endoscopy;  Laterality: N/A;  ? ESOPHAGOGASTRODUODENOSCOPY N/A 06/03/2021  ? Procedure: ESOPHAGOGASTRODUODENOSCOPY (EGD);  Surgeon: Virgel Manifold, MD;  Location: [facility] ENDOSCOPY;  Service: Endoscopy;  Laterality: N/A;  ? TUBAL LIGATION Bilateral 09/25/2002  ? tubal reversal    ? ? ?Current Outpatient Medications:  ?  Acetylcysteine 600 MG TBCR, Take by mouth., Disp: , Rfl:  ?  cetirizine (ZYRTEC) 10 MG tablet, Take 1 tablet by mouth daily., Disp: , Rfl:  ?  diazepam (VALIUM) 10 MG tablet, Take 1 tablet by mouth 4 (four) times daily as needed., Disp: , Rfl:  ?  fluticasone (FLONASE) 50 MCG/ACT nasal spray,  Place 1 spray into both nostrils daily., Disp: 16 g, Rfl: 0 ?  guaifenesin (ROBITUSSIN) 100 MG/5ML syrup, Take 200 mg by mouth 3 (three) times daily as needed for cough., Disp: , Rfl:  ?  guanFACINE (INTUNIV) 1 MG TB24 ER tablet, Take 1 tablet by mouth daily., Disp: , Rfl:  ?   ipratropium-albuterol (DUONEB) 0.5-2.5 (3) MG/3ML SOLN, Inhale 3 mLs into the lungs every 4 (four) hours as needed., Disp: 360 mL, Rfl: 11 ?  magnesium oxide (MAG-OX) 400 MG tablet, Take 1 tablet by mouth daily., Disp: , Rfl:  ?  omeprazole (PRILOSEC) 40 MG capsule, Take 1 capsule (40 mg total) by mouth daily before breakfast., Disp: 30 capsule, Rfl: 3 ?  ondansetron (ZOFRAN-ODT) 4 MG disintegrating tablet, Take 1 tablet (4 mg total) by mouth every 8 (eight) hours as needed for nausea or vomiting., Disp: 20 tablet, Rfl: 0 ?  QUEtiapine (SEROQUEL XR) 300 MG 24 hr tablet, Take 600 mg by mouth at bedtime., Disp: , Rfl:  ?  TRELEGY ELLIPTA 200-62.5-25 MCG/INH AEPB, INHALE 1 PUFF ONCE DAILY, Disp: 60 each, Rfl: 3 ?  albuterol (VENTOLIN HFA) 108 (90 Base) MCG/ACT inhaler, Inhale 1-2 puffs into the lungs every 4 (four) hours as needed for wheezing or shortness of breath., Disp: 18 g, Rfl: 11 ?  rizatriptan (MAXALT-MLT) 10 MG disintegrating tablet, TAKE 1 TABLET BY MOUTH AT ONSET OF MIGRAINE - MAY TAKE A SECOND DOSE AFTER 2 HOURS IF NEEDED, Disp: 10 tablet, Rfl: 0 ? ? ? ?Family History  ?Problem Relation Age of Onset  ? Hypertension Mother   ? Diabetes Mother   ? Arthritis Mother   ? Heart disease Mother   ? Breast cancer Neg Hx   ?  ? ?Social History  ? ?Tobacco Use  ? Smoking status: Former  ?  Packs/day: 0.50  ?  Years: 5.00  ?  Pack years: 2.50  ?  Types: Cigarettes  ? Smokeless tobacco: Never  ? Tobacco comments:  ?  Quit mid October 2014  ?Vaping Use  ? Vaping Use: Never used  ?Substance Use Topics  ? Alcohol use: No  ?  Alcohol/week: 0.0 standard drinks  ? Drug use: No  ? ? ?Allergies as of 01/05/2022 - Review Complete 01/05/2022  ?Allergen Reaction Noted  ? Levaquin [levofloxacin] Swelling 04/30/2020  ? Tramadol Other (See Comments) 02/20/2013  ? Amoxicillin Nausea And Vomiting 01/20/2019  ? Augmentin [amoxicillin-pot clavulanate] Other (See Comments) 02/20/2013  ? Erythromycin Nausea And Vomiting 06/25/2015  ?  Lithium Nausea Only 02/20/2013  ? ? ?Review of Systems:    ?All systems reviewed and negative except where noted in HPI. ? ? Physical Exam:  ?BP (!) 143/92 (BP Location: Left Arm, Patient Position: Sitting, Cuff Size: Normal)   Pulse 97   Temp 98 ?F (36.7 ?C) (Oral)   Ht 5' 6.5" (1.689 m)   Wt 172 lb 4 oz (78.1 kg)   BMI 27.39 kg/m?  ?No LMP recorded. ? ?General:   Alert,  Well-developed, well-nourished, pleasant and cooperative in NAD ?Head:  Normocephalic and atraumatic. ?Eyes:  Sclera clear, no icterus.   Conjunctiva pink. ?Ears:  Normal auditory acuity. ?Nose:  No deformity, discharge, or lesions. ?Mouth:  No deformity or lesions,oropharynx pink & moist. ?Neck:  Supple; no masses or thyromegaly. ?Lungs:  Respirations even and unlabored.  Clear throughout to auscultation.   No wheezes, crackles, or rhonchi. No acute distress. ?Heart:  Regular rate and rhythm; no murmurs, clicks, rubs, or gallops. ?Abdomen:  Normal bowel sounds. Soft, non-tender and non-distended without masses, hepatosplenomegaly or hernias noted.  No guarding or rebound tenderness.   ?Rectal: Not performed ?Msk:  Symmetrical without gross deformities. Good, equal movement & strength bilaterally. ?Pulses:  Normal pulses noted. ?Extremities:  No clubbing or edema.  No cyanosis. ?Neurologic:  Alert and oriented x3;  grossly normal neurologically. ?Skin:  Intact without significant lesions or rashes. No jaundice. ?Psych:  Alert and cooperative. Normal mood and affect. ? ?Imaging Studies: ?Reviewed ? ?Assessment and Plan:  ? ?Heather [name] is a 46 y.o. female with history of chronic GI symptoms including chronic upper abdominal pain associated nausea, chronic severe constipation associated with abdominal bloating, abdominal cramps ? ?Chronic upper abdominal pain associated with nausea ?Recommend HIDA scan ?EGD was unremarkable, no evidence of H. pylori ? ?Erosive esophagitis ?Switch to omeprazole 40 mg once a day before breakfast ? ?Small  hiatal hernia ?Patient is evaluated by [facility] surgery, would not benefit from hernia repair ? 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 ? ?Follow up in

## 2022-01-05 NOTE — Patient Instructions (Addendum)
Gave Linzess 145 samples  ?Your Hida scan is schedule for 01/17/22 arrive at 10:00am. Please arrive at 9:30am at [facility] mall. Nothing to eat or drink after midnight.  ?

## 2022-01-17 ENCOUNTER — Ambulatory Visit
Admission: RE | Admit: 2022-01-17 | Discharge: 2022-01-17 | Disposition: A | Discharge status: Home / Self Care | Payer: BC Managed Care – PPO | Source: Ambulatory Visit | Attending: Gastroenterology | Admitting: Gastroenterology

## 2022-01-17 DIAGNOSIS — R112 Nausea with vomiting, unspecified: Secondary | ICD-10-CM | POA: Insufficient documentation

## 2022-01-17 MED ORDER — TECHNETIUM TC 99M MEBROFENIN IV KIT
5.0000 | PACK | Freq: Once | INTRAVENOUS | Status: AC
  Administered 2022-01-17: 4.96 via INTRAVENOUS

## 2022-01-30 ENCOUNTER — Telehealth: Payer: Self-pay | Admitting: Internal Medicine

## 2022-01-30 NOTE — Telephone Encounter (Signed)
Received a notification that she is overdue a mammogram.  Need to schedule.  Thanks.  ?

## 2022-01-31 DIAGNOSIS — M545 Low back pain, unspecified: Secondary | ICD-10-CM | POA: Diagnosis not present

## 2022-01-31 DIAGNOSIS — R202 Paresthesia of skin: Secondary | ICD-10-CM | POA: Diagnosis not present

## 2022-01-31 DIAGNOSIS — M79652 Pain in left thigh: Secondary | ICD-10-CM | POA: Diagnosis not present

## 2022-01-31 DIAGNOSIS — R413 Other amnesia: Secondary | ICD-10-CM | POA: Diagnosis not present

## 2022-01-31 DIAGNOSIS — M79651 Pain in right thigh: Secondary | ICD-10-CM | POA: Diagnosis not present

## 2022-01-31 DIAGNOSIS — F419 Anxiety disorder, unspecified: Secondary | ICD-10-CM | POA: Diagnosis not present

## 2022-01-31 DIAGNOSIS — M79602 Pain in left arm: Secondary | ICD-10-CM | POA: Diagnosis not present

## 2022-01-31 DIAGNOSIS — R4189 Other symptoms and signs involving cognitive functions and awareness: Secondary | ICD-10-CM | POA: Diagnosis not present

## 2022-01-31 DIAGNOSIS — R5383 Other fatigue: Secondary | ICD-10-CM | POA: Diagnosis not present

## 2022-01-31 DIAGNOSIS — F418 Other specified anxiety disorders: Secondary | ICD-10-CM | POA: Diagnosis not present

## 2022-01-31 DIAGNOSIS — R0600 Dyspnea, unspecified: Secondary | ICD-10-CM | POA: Diagnosis not present

## 2022-01-31 DIAGNOSIS — G8929 Other chronic pain: Secondary | ICD-10-CM | POA: Diagnosis not present

## 2022-01-31 DIAGNOSIS — R439 Unspecified disturbances of smell and taste: Secondary | ICD-10-CM | POA: Diagnosis not present

## 2022-01-31 DIAGNOSIS — G43909 Migraine, unspecified, not intractable, without status migrainosus: Secondary | ICD-10-CM | POA: Diagnosis not present

## 2022-01-31 DIAGNOSIS — R42 Dizziness and giddiness: Secondary | ICD-10-CM | POA: Diagnosis not present

## 2022-01-31 DIAGNOSIS — G479 Sleep disorder, unspecified: Secondary | ICD-10-CM | POA: Diagnosis not present

## 2022-01-31 DIAGNOSIS — R000 Tachycardia, unspecified: Secondary | ICD-10-CM | POA: Diagnosis not present

## 2022-01-31 DIAGNOSIS — M79601 Pain in right arm: Secondary | ICD-10-CM | POA: Diagnosis not present

## 2022-01-31 DIAGNOSIS — F3132 Bipolar disorder, current episode depressed, moderate: Secondary | ICD-10-CM | POA: Diagnosis not present

## 2022-01-31 DIAGNOSIS — U099 Post covid-19 condition, unspecified: Secondary | ICD-10-CM | POA: Diagnosis not present

## 2022-02-02 ENCOUNTER — Encounter: Payer: Self-pay | Admitting: Internal Medicine

## 2022-02-02 ENCOUNTER — Ambulatory Visit: Payer: BC Managed Care – PPO | Admitting: Internal Medicine

## 2022-02-02 VITALS — BP 132/72 | HR 85 | Temp 97.9°F | Resp 14 | Ht 66.0 in | Wt 168.0 lb

## 2022-02-02 DIAGNOSIS — E7800 Pure hypercholesterolemia, unspecified: Secondary | ICD-10-CM | POA: Diagnosis not present

## 2022-02-02 DIAGNOSIS — R5383 Other fatigue: Secondary | ICD-10-CM

## 2022-02-02 DIAGNOSIS — Z1231 Encounter for screening mammogram for malignant neoplasm of breast: Secondary | ICD-10-CM | POA: Diagnosis not present

## 2022-02-02 DIAGNOSIS — J4541 Moderate persistent asthma with (acute) exacerbation: Secondary | ICD-10-CM

## 2022-02-02 DIAGNOSIS — F419 Anxiety disorder, unspecified: Secondary | ICD-10-CM

## 2022-02-02 DIAGNOSIS — G40909 Epilepsy, unspecified, not intractable, without status epilepticus: Secondary | ICD-10-CM | POA: Diagnosis not present

## 2022-02-02 DIAGNOSIS — K5900 Constipation, unspecified: Secondary | ICD-10-CM

## 2022-02-02 DIAGNOSIS — R110 Nausea: Secondary | ICD-10-CM

## 2022-02-02 NOTE — Progress Notes (Signed)
Patient ID: Heather [name], female   DOB: [birth year], 46 y.o.   MRN: [id number]   Subjective:    Patient ID: Heather [name], female    DOB: 08-Mar-1976, 46 y.o.   MRN: [id number]   Patient here for a scheduled follow up.   Chief Complaint  Patient presents with   Follow-up    62mof/u   .   HPI Here to follow up regarding persistent symptoms post covid, fatigue and asthma.  Recently evaluated at covid clinic - multidisciplinary approach.  Persistent fatigue and memory issues.  Seeing PT, OT and neuropsych.  She is unclear regarding  plan for f/u.  Instructed to not exercise.  No yard work.  Had questions about being out of work.  Eating.  Breathing overall stable.  No increased cough and congestion.  Linzess - improved bowels.     Past Medical History:  Diagnosis Date   Asthma    Depression    bipolar   Dysrhythmia    Fainting spell    H/O   H/O cardiac arrhythmia    Migraines    Seizures (HCC)    Past Surgical History:  Procedure Laterality Date   COLONOSCOPY WITH PROPOFOL N/A 06/03/2021   Procedure: COLONOSCOPY WITH PROPOFOL;  Surgeon: TVirgel Manifold MD;  Location: [facility] ENDOSCOPY;  Service: Endoscopy;  Laterality: N/A;   ESOPHAGOGASTRODUODENOSCOPY N/A 06/03/2021   Procedure: ESOPHAGOGASTRODUODENOSCOPY (EGD);  Surgeon: TVirgel Manifold MD;  [facility];  Service: Endoscopy;  Laterality: N/A;   TUBAL LIGATION Bilateral 09/25/2002   tubal reversal     Family History  Problem Relation Age of Onset   Hypertension Mother    Diabetes Mother    Arthritis Mother    Heart disease Mother    Breast cancer Neg Hx    Social History   Socioeconomic History   Marital status: Married    Spouse name: Not on file   Number of children: Not on file   Years of education: Not on file   Highest education level: Not on file  Occupational History   Not on file  Tobacco Use   Smoking status: Former    Packs/day: 0.50    Years: 5.00    Pack years: 2.50    Types:  Cigarettes   Smokeless tobacco: Never   Tobacco comments:    Quit mid October 2014  Vaping Use   Vaping Use: Never used  Substance and Sexual Activity   Alcohol use: No    Alcohol/week: 0.0 standard drinks   Drug use: No   Sexual activity: Not on file  Other Topics Concern   Not on file  Social History Narrative   Not on file   Social Determinants of Health   Financial Resource Strain: Not on file  Food Insecurity: Not on file  Transportation Needs: Not on file  Physical Activity: Not on file  Stress: Not on file  Social Connections: Not on file     Review of Systems  Constitutional:  Positive for fatigue. Negative for appetite change and unexpected weight change.  HENT:  Negative for congestion and sinus pressure.   Respiratory:  Negative for cough, chest tightness and shortness of breath.   Cardiovascular:  Negative for chest pain, palpitations and leg swelling.  Gastrointestinal:  Negative for abdominal pain, diarrhea, nausea and vomiting.  Genitourinary:  Negative for difficulty urinating and dysuria.  Musculoskeletal:  Negative for joint swelling and myalgias.  Skin:  Negative for color change and rash.  Neurological:  Negative for dizziness, light-headedness and headaches.  Psychiatric/Behavioral:  Negative for agitation and dysphoric mood.       Objective:     BP 132/72 (BP Location: Left Arm, Patient Position: Sitting, Cuff Size: Small)   Pulse 85   Temp 97.9 F (36.6 C) (Temporal)   Resp 14   Ht '5\' 6"'$  (8.502 m)   Wt 168 lb (76.2 kg)   LMP 01/16/2022 (Exact Date) Comment: not breastfeeding.  SpO2 98%   BMI 27.12 kg/m  Wt Readings from Last 3 Encounters:  02/02/22 168 lb (76.2 kg)  01/05/22 172 lb 4 oz (78.1 kg)  11/03/21 166 lb 6.4 oz (75.5 kg)    Physical Exam Vitals reviewed.  Constitutional:      General: She is not in acute distress.    Appearance: Normal appearance.  HENT:     Head: Normocephalic and atraumatic.     Right Ear: External  ear normal.     Left Ear: External ear normal.  Eyes:     General: No scleral icterus.       Right eye: No discharge.        Left eye: No discharge.     Conjunctiva/sclera: Conjunctivae normal.  Neck:     Thyroid: No thyromegaly.  Cardiovascular:     Rate and Rhythm: Normal rate and regular rhythm.  Pulmonary:     Effort: No respiratory distress.     Breath sounds: Normal breath sounds. No wheezing.  Abdominal:     General: Bowel sounds are normal.     Palpations: Abdomen is soft.     Tenderness: There is no abdominal tenderness.  Musculoskeletal:        General: No swelling or tenderness.     Cervical back: Neck supple. No tenderness.  Lymphadenopathy:     Cervical: No cervical adenopathy.  Skin:    Findings: No erythema or rash.  Neurological:     Mental Status: [name].  Psychiatric:        Mood and Affect: Mood normal.        Behavior: Behavior normal.     Outpatient Encounter Medications as of 02/02/2022  Medication Sig   Acetylcysteine 600 MG TBCR Take by mouth.   albuterol (VENTOLIN HFA) 108 (90 Base) MCG/ACT inhaler Inhale 1-2 puffs into the lungs every 4 (four) hours as needed for wheezing or shortness of breath.   cetirizine (ZYRTEC) 10 MG tablet Take 1 tablet by mouth daily.   diazepam (VALIUM) 10 MG tablet Take 1 tablet by mouth 4 (four) times daily as needed.   fluticasone (FLONASE) 50 MCG/ACT nasal spray Place 1 spray into both nostrils daily.   guanFACINE (INTUNIV) 1 MG TB24 ER tablet Take 1 tablet by mouth daily.   ipratropium-albuterol (DUONEB) 0.5-2.5 (3) MG/3ML SOLN Inhale 3 mLs into the lungs every 4 (four) hours as needed.   magnesium oxide (MAG-OX) 400 MG tablet Take 1 tablet by mouth daily.   omeprazole (PRILOSEC) 40 MG capsule Take 1 capsule (40 mg total) by mouth daily before breakfast.   TRELEGY ELLIPTA 200-62.5-25 MCG/INH AEPB INHALE 1 PUFF ONCE DAILY   [DISCONTINUED] ondansetron (ZOFRAN-ODT) 4 MG disintegrating tablet Take 1 tablet (4 mg  total) by mouth every 8 (eight) hours as needed for nausea or vomiting.   [DISCONTINUED] rizatriptan (MAXALT-MLT) 10 MG disintegrating tablet TAKE 1 TABLET BY MOUTH AT ONSET OF MIGRAINE - MAY TAKE A SECOND DOSE AFTER 2 HOURS IF NEEDED   [DISCONTINUED] guaifenesin (ROBITUSSIN) 100  MG/5ML syrup Take 200 mg by mouth 3 (three) times daily as needed for cough. (Patient not taking: Reported on 02/02/2022)   [DISCONTINUED] QUEtiapine (SEROQUEL XR) 300 MG 24 hr tablet Take 600 mg by mouth at bedtime. (Patient not taking: Reported on 02/02/2022)   No facility-administered encounter medications on file as of 02/02/2022.     Lab Results  Component Value Date   WBC 11.9 (H) 08/29/2021   HGB 13.0 08/29/2021   HCT 40.0 08/29/2021   PLT 233.0 08/29/2021   GLUCOSE 78 08/29/2021   CHOL 206 (H) 08/29/2021   TRIG 164.0 (H) 08/29/2021   HDL 39.20 08/29/2021   LDLDIRECT 127.0 09/29/2020   LDLCALC 134 (H) 08/29/2021   ALT 15 08/29/2021   AST 15 08/29/2021   NA 136 08/29/2021   K 3.7 08/29/2021   CL 105 08/29/2021   CREATININE 0.82 08/29/2021   BUN 9 08/29/2021   CO2 24 08/29/2021   TSH 1.47 08/29/2021    NM Hepato W/EjeCT Fract  Result Date: 01/17/2022 CLINICAL DATA:  Nausea and vomiting. EXAM: NUCLEAR MEDICINE HEPATOBILIARY IMAGING WITH GALLBLADDER EF TECHNIQUE: Sequential images of the abdomen were obtained out to 60 minutes following intravenous administration of radiopharmaceutical. After oral ingestion of Ensure, gallbladder ejection fraction was determined. At 60 min, normal ejection fraction is greater than 33%. RADIOPHARMACEUTICALS:  4.96 mCi Tc-57m Choletec IV COMPARISON:  Right upper quadrant ultrasound March 29, 2021 FINDINGS: Prompt uptake and biliary excretion of activity by the liver is seen. Gallbladder activity is visualized, consistent with patency of cystic duct. Biliary activity passes into small bowel, consistent with patent common bile duct. Calculated gallbladder ejection fraction is  92%. (Normal gallbladder ejection fraction with Ensure is greater than 33%.) IMPRESSION: 1.  Patent cystic and common bile ducts. 2.  Normal gallbladder ejection fraction. Electronically Signed   By: JDahlia BailiffM.D.   On: 01/17/2022 16:40       Assessment & Plan:   Problem List Items Addressed This Visit     Anxiety    Seeing Dr KToy Care  Has valium if needed.  Follow.         Asthma    Continue trelegy.  Breathing overall stable.        Constipation    linzess helps.        Fatigue    Persistent issue for her since covid.  Evaluated at covid clinic as outlined.  Being evaluated by PT, OT and neuropsych.  Will need to f/u to confirm plan for further treatment and evaluation.        Hypercholesterolemia    Low cholesterol diet and exercise.  Follow lipid panel.        Nausea    Has zofran to take prn.        Seizure disorder ([facility]    Has been followed by neurology.  Stable.        Other Visit Diagnoses     Visit for screening mammogram    -  Primary   Relevant Orders   MM 3D SCREEN BREAST BILATERAL        CEinar Pheasant MD

## 2022-02-02 NOTE — Telephone Encounter (Signed)
Pt declines at this time. Dr Nicki Reaper aware. ?

## 2022-02-08 ENCOUNTER — Other Ambulatory Visit: Payer: Self-pay | Admitting: Internal Medicine

## 2022-02-08 MED ORDER — ONDANSETRON 4 MG PO TBDP: 4.0000 mg | ORAL_TABLET | Freq: Two times a day (BID) | ORAL | 0 refills | Status: DC | PRN

## 2022-02-08 MED ORDER — RIZATRIPTAN BENZOATE 10 MG PO TBDP: ORAL_TABLET | ORAL | 0 refills | Status: DC

## 2022-02-08 NOTE — Telephone Encounter (Signed)
Rx ok'd for zofran #20 with no refills.  ?

## 2022-02-09 ENCOUNTER — Telehealth: Payer: Self-pay | Admitting: Internal Medicine

## 2022-02-09 DIAGNOSIS — F902 Attention-deficit hyperactivity disorder, combined type: Secondary | ICD-10-CM | POA: Diagnosis not present

## 2022-02-09 NOTE — Telephone Encounter (Signed)
Received notification overdue mammogram.  Need to schedule.  Order was placed 02/02/22 at her office visit.  Please schedule.

## 2022-02-10 ENCOUNTER — Encounter: Payer: Self-pay | Admitting: Obstetrics and Gynecology

## 2022-02-12 NOTE — Assessment & Plan Note (Signed)
linzess helps.

## 2022-02-12 NOTE — Assessment & Plan Note (Signed)
Has been followed by neurology.  Stable.  

## 2022-02-12 NOTE — Assessment & Plan Note (Signed)
Has zofran to take prn.

## 2022-02-12 NOTE — Assessment & Plan Note (Signed)
Persistent issue for her since covid.  Evaluated at covid clinic as outlined.  Being evaluated by PT, OT and neuropsych.  Will need to f/u to confirm plan for further treatment and evaluation.

## 2022-02-12 NOTE — Assessment & Plan Note (Signed)
Low cholesterol diet and exercise.  Follow lipid panel.   

## 2022-02-12 NOTE — Assessment & Plan Note (Signed)
Seeing Dr Toy Care.  Has valium if needed.  Follow.

## 2022-02-12 NOTE — Assessment & Plan Note (Signed)
Continue trelegy.  Breathing overall stable.

## 2022-02-13 NOTE — Telephone Encounter (Signed)
Pt declined scheduling mammo at this time, stated she has a lot going on and wants to get that sorted out first.

## 2022-03-04 ENCOUNTER — Other Ambulatory Visit: Payer: Self-pay | Admitting: Internal Medicine

## 2022-03-10 ENCOUNTER — Other Ambulatory Visit: Payer: Self-pay | Admitting: Internal Medicine

## 2022-03-10 DIAGNOSIS — J4541 Moderate persistent asthma with (acute) exacerbation: Secondary | ICD-10-CM

## 2022-03-15 ENCOUNTER — Ambulatory Visit: Payer: BC Managed Care – PPO | Admitting: Obstetrics and Gynecology

## 2022-03-15 ENCOUNTER — Encounter: Payer: Self-pay | Admitting: Internal Medicine

## 2022-03-15 ENCOUNTER — Encounter: Payer: Self-pay | Admitting: Obstetrics and Gynecology

## 2022-03-15 VITALS — BP 119/81 | HR 106 | Ht 66.0 in | Wt 172.1 lb

## 2022-03-15 DIAGNOSIS — R87612 Low grade squamous intraepithelial lesion on cytologic smear of cervix (LGSIL): Secondary | ICD-10-CM

## 2022-03-15 DIAGNOSIS — Z7689 Persons encountering health services in other specified circumstances: Secondary | ICD-10-CM | POA: Diagnosis not present

## 2022-03-15 DIAGNOSIS — R87619 Unspecified abnormal cytological findings in specimens from cervix uteri: Secondary | ICD-10-CM | POA: Insufficient documentation

## 2022-03-15 NOTE — Progress Notes (Signed)
HPI:      Heather [name] is a 46 y.o. [id number] who LMP was Patient's last menstrual period was 02/19/2022 (exact date).  Subjective:   She presents today after having a first abnormal Pap smear which showed LGSIL and negative HPV. She reports that she has never had abnormal Pap smears in the past.  Has never had any type of treatment on her cervix. She does not smoke cigarettes.    Hx: The following portions of the patient's history were reviewed and updated as appropriate:             She  has a past medical history of Asthma, Depression, Dysrhythmia, Fainting spell, H/O cardiac arrhythmia, Migraines, and Seizures ([place]). She does not have any pertinent problems on file. She  has a past surgical history that includes Tubal ligation (Bilateral, 09/25/2002); tubal reversal; Colonoscopy with propofol (N/A, 06/03/2021); and Esophagogastroduodenoscopy (N/A, 06/03/2021). Her family history includes Arthritis in her mother; Diabetes in her mother; Heart disease in her mother; Hypertension in her mother. She  reports that she has quit smoking. Her smoking use included cigarettes. She has a 2.50 pack-year smoking history. She has never used smokeless tobacco. She reports that she does not drink alcohol and does not use drugs. She has a current medication list which includes the following prescription(s): acetylcysteine, albuterol, cetirizine, diazepam, fluticasone, ipratropium-albuterol, magnesium oxide, omeprazole, ondansetron, rizatriptan, trelegy ellipta, and guanfacine. She is allergic to levaquin [levofloxacin], tramadol, amoxicillin, augmentin [amoxicillin-pot clavulanate], erythromycin, and lithium.       Review of Systems:  Review of Systems  Constitutional: Denied constitutional symptoms, night sweats, recent illness, fatigue, fever, insomnia and weight loss.  Eyes: Denied eye symptoms, eye pain, photophobia, vision change and visual disturbance.  Ears/Nose/Throat/Neck: Denied ear, nose,  throat or neck symptoms, hearing loss, nasal discharge, sinus congestion and sore throat.  Cardiovascular: Denied cardiovascular symptoms, arrhythmia, chest pain/pressure, edema, exercise intolerance, orthopnea and palpitations.  Respiratory: Denied pulmonary symptoms, asthma, pleuritic pain, productive sputum, cough, dyspnea and wheezing.  Gastrointestinal: Denied, gastro-esophageal reflux, melena, nausea and vomiting.  Genitourinary: See HPI for additional information.  Musculoskeletal: Denied musculoskeletal symptoms, stiffness, swelling, muscle weakness and myalgia.  Dermatologic: Denied dermatology symptoms, rash and scar.  Neurologic: Denied neurology symptoms, dizziness, headache, neck pain and syncope.  Psychiatric: Denied psychiatric symptoms, anxiety and depression.  Endocrine: Denied endocrine symptoms including hot flashes and night sweats.   Meds:   Current Outpatient Medications on File Prior to Visit  Medication Sig Dispense Refill   Acetylcysteine 600 MG TBCR Take by mouth.     albuterol (VENTOLIN HFA) 108 (90 Base) MCG/ACT inhaler INHALE 1 TO 2 PUFFS INTO THE LUNGS EVERY 4 HOURS AS NEEDED FOR WHEEZING OR SHORTNESS OF BREATH 18 g 0   cetirizine (ZYRTEC) 10 MG tablet Take 1 tablet by mouth daily.     diazepam (VALIUM) 10 MG tablet Take 1 tablet by mouth 4 (four) times daily as needed.     fluticasone (FLONASE) 50 MCG/ACT nasal spray Place 1 spray into both nostrils daily. 16 g 0   ipratropium-albuterol (DUONEB) 0.5-2.5 (3) MG/3ML SOLN Inhale 3 mLs into the lungs every 4 (four) hours as needed. 360 mL 11   magnesium oxide (MAG-OX) 400 MG tablet Take 1 tablet by mouth daily.     omeprazole (PRILOSEC) 40 MG capsule Take 1 capsule (40 mg total) by mouth daily before breakfast. 30 capsule 3   ondansetron (ZOFRAN-ODT) 4 MG disintegrating tablet Take 1 tablet (4 mg total) by mouth  2 (two) times daily as needed for nausea or vomiting. 20 tablet 0   rizatriptan (MAXALT-MLT) 10 MG  disintegrating tablet TAKE 1 TABLET BY MOUTH AT ONSET OF MIGRAINE - MAY TAKE A SECOND DOSE AFTER 2 HOURS IF NEEDED 10 tablet 0   TRELEGY ELLIPTA 200-62.5-25 MCG/INH AEPB INHALE 1 PUFF ONCE DAILY 60 each 3   guanFACINE (INTUNIV) 1 MG TB24 ER tablet Take 1 tablet by mouth daily. (Patient not taking: Reported on 03/15/2022)     No current facility-administered medications on file prior to visit.      Objective:     Vitals:   03/15/22 1010  BP: 119/81  Pulse: (!) 106   Filed Weights   03/15/22 1010  Weight: 172 lb 1.6 oz (78.1 kg)              Pap smear results cytology and HPV reviewed directly with the patient          Assessment:    [id number] Patient Active Problem List   Diagnosis Date Noted   Constipation 01/05/2022   Non-intractable vomiting    Hiatal hernia    Retained food in stomach    Special screening for malignant neoplasms, colon    Rectal polyp    Asthmatic bronchitis with acute exacerbation 05/27/2021   Pre-op evaluation 05/24/2021   Anxiety 02/27/2021   Cough 02/25/2021   Vitamin D deficiency 02/04/2021   Fatigue 09/29/2020   Nausea 09/12/2020   Encounter for completion of form with patient 07/18/2020   History of COVID-19 06/11/2020   Bronchitis 09/22/2019   Sleep disturbance 03/16/2019   Herpes zoster without complication 16/06/9603   History of seizure 01/22/2019   GERD (gastroesophageal reflux disease) 01/05/2019   Back pain 12/21/2018   Leukocytosis 11/21/2018   Abdominal pain 11/19/2018   Conjunctivitis 05/23/2018   Sleeping difficulty 10/31/2017   Rash 06/11/2017   Health care maintenance 10/12/2016   History of miscarriage 10/12/2016   Hypercholesterolemia 07/09/2016   Migraine 07/20/2013   Seizure disorder ([place]) 07/20/2013   Asthma 07/20/2013   Alternating constipation and diarrhea 07/20/2013   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 Establishing care with new doctor, encounter for   2. Pap smear abnormality of cervix  with LGSIL     Based our current theory of HPV and its effect on dysplasia her Pap smear is inconsistent with that theory.  One should not be able to have LGSIL and negative HPV.   Plan:            1.  We have discussed cellularity of Pap smears and the history of Pap smears.  Dysplasia has been described and discussed.  2.  The addition of HPV to Pap smears has been discussed and its current effect on dysplasia was discussed and reviewed.  3.  I have answered all of her questions and offered her 3 options for today.  Colposcopy, repeat Pap smear with HPV, or expectant management with repeat Pap in 1 year. ([place]).  After a long discussion she has chosen option #3 -a repeat Pap Co-test in 1 year.  I think this is reasonable based on her current inconsistent Pap, the slow-growing slow changing nature of dysplasia, her history of negative Pap smears, and her current negative HPV.  I have stressed the importance of 1 year follow-up. Orders No orders of the defined types were placed in this encounter.   No orders of the defined types were placed in this encounter.  F/U  Return in about 1 year (around 03/16/2023) for Annual Physical. I spent 31 minutes involved in the care of this patient preparing to see the patient by obtaining and reviewing her medical history (including labs, imaging tests and prior procedures), documenting clinical information in the electronic health record (EHR), counseling and coordinating care plans, writing and sending prescriptions, ordering tests or procedures and in direct communicating with the patient and medical staff discussing pertinent items from her history and physical exam.  Finis Bud, M.D. 03/15/2022 11:06 AM

## 2022-03-15 NOTE — Progress Notes (Signed)
Patient presents today due to an abnormal pap smear preformed by PCP. She states no history of abnormal pap smears. No questions or concerns at this time.

## 2022-03-31 ENCOUNTER — Ambulatory Visit: Payer: BC Managed Care – PPO | Admitting: Internal Medicine

## 2022-03-31 ENCOUNTER — Encounter: Payer: Self-pay | Admitting: Internal Medicine

## 2022-03-31 DIAGNOSIS — R5383 Other fatigue: Secondary | ICD-10-CM

## 2022-03-31 DIAGNOSIS — R87619 Unspecified abnormal cytological findings in specimens from cervix uteri: Secondary | ICD-10-CM | POA: Diagnosis not present

## 2022-03-31 DIAGNOSIS — G40909 Epilepsy, unspecified, not intractable, without status epilepticus: Secondary | ICD-10-CM

## 2022-03-31 DIAGNOSIS — F419 Anxiety disorder, unspecified: Secondary | ICD-10-CM

## 2022-03-31 DIAGNOSIS — J4541 Moderate persistent asthma with (acute) exacerbation: Secondary | ICD-10-CM | POA: Diagnosis not present

## 2022-03-31 DIAGNOSIS — Z8616 Personal history of COVID-19: Secondary | ICD-10-CM

## 2022-03-31 DIAGNOSIS — E7800 Pure hypercholesterolemia, unspecified: Secondary | ICD-10-CM

## 2022-03-31 DIAGNOSIS — K219 Gastro-esophageal reflux disease without esophagitis: Secondary | ICD-10-CM

## 2022-03-31 MED ORDER — PREDNISONE 10 MG PO TABS: ORAL_TABLET | ORAL | 0 refills | Status: DC

## 2022-03-31 MED ORDER — ALBUTEROL SULFATE HFA 108 (90 BASE) MCG/ACT IN AERS: INHALATION_SPRAY | RESPIRATORY_TRACT | 2 refills | Status: DC

## 2022-03-31 MED ORDER — IPRATROPIUM-ALBUTEROL 0.5-2.5 (3) MG/3ML IN SOLN: 3.0000 mL | RESPIRATORY_TRACT | 11 refills | Status: DC | PRN

## 2022-03-31 NOTE — Progress Notes (Signed)
Patient ID: Heather [name], female   DOB: [birth year], 46 y.o.   MRN: [id number]   Subjective:    Patient ID: Heather [name], female    DOB: 05-Jul-1976, 45 y.o.   MRN: [id number]   Patient here for a scheduled follow up.   Chief Complaint  Patient presents with   Asthma   .   HPI Having issues with persistent allergies - sneezing, nasal congestion.  Over the last few weeks, symptoms have progressed.  Has noticed increased cough and congestion.  Some sob - using nebulizer treatments - now 2-4/day.  Using trelegy.  Acid reflux controlled.  Taking zyrtec and benadryl. Evaluated at [facility] clinic.  Saw various therapists, etc.  Notes have been reviewed.  Had neuropsych evaluation.  Concern raised regarding diagnosis of ADHD. Still with persistent increased fatigue.  No abdominal pain.  Bowels moving.     Past Medical History:  Diagnosis Date   Asthma    Depression    bipolar   Dysrhythmia    Fainting spell    H/O   H/O cardiac arrhythmia    Migraines    Seizures (HCC)    Past Surgical History:  Procedure Laterality Date   COLONOSCOPY WITH PROPOFOL N/A 06/03/2021   Procedure: COLONOSCOPY WITH PROPOFOL;  Surgeon: Virgel Manifold, MD;  Location: [facility] ENDOSCOPY;  Service: Endoscopy;  Laterality: N/A;   ESOPHAGOGASTRODUODENOSCOPY N/A 06/03/2021   Procedure: ESOPHAGOGASTRODUODENOSCOPY (EGD);  Surgeon: Virgel Manifold, MD;  Location: [facility] ENDOSCOPY;  Service: Endoscopy;  Laterality: N/A;   TUBAL LIGATION Bilateral 09/25/2002   tubal reversal     Family History  Problem Relation Age of Onset   Hypertension Mother    Diabetes Mother    Arthritis Mother    Heart disease Mother    Breast cancer Neg Hx    Social History   Socioeconomic History   Marital status: Married    Spouse name: Not on file   Number of children: Not on file   Years of education: Not on file   Highest education level: Not on file  Occupational History   Not on file  Tobacco Use   Smoking status: Former     Packs/day: 0.50    Years: 5.00    Total pack years: 2.50    Types: Cigarettes   Smokeless tobacco: Never   Tobacco comments:    Quit mid October 2014  Vaping Use   Vaping Use: Never used  Substance and Sexual Activity   Alcohol use: No    Alcohol/week: 0.0 standard drinks of alcohol   Drug use: No   Sexual activity: Yes    Birth control/protection: None  Other Topics Concern   Not on file  Social History Narrative   Not on file   Social Determinants of Health   Financial Resource Strain: Not on file  Food Insecurity: Not on file  Transportation Needs: Not on file  Physical Activity: Not on file  Stress: Not on file  Social Connections: Not on file     Review of Systems  Constitutional:  Positive for fatigue. Negative for unexpected weight change.  HENT:  Positive for congestion and postnasal drip.   Respiratory:  Positive for cough and shortness of breath. Negative for chest tightness.   Cardiovascular:  Negative for chest pain, palpitations and leg swelling.  Gastrointestinal:  Negative for abdominal pain, diarrhea, nausea and vomiting.  Genitourinary:  Negative for difficulty urinating and dysuria.  Musculoskeletal:  Negative for joint swelling and myalgias.  Skin:  Negative for color change and rash.  Neurological:  Negative for dizziness, light-headedness and headaches.  Psychiatric/Behavioral:  Negative for agitation and dysphoric mood.        Objective:     BP 136/70 (BP Location: Left Arm, Patient Position: Sitting, Cuff Size: Small)   Pulse 95   Temp 98.2 F (36.8 C) (Temporal)   Resp 19   Ht '5\' 6"'$  (1.676 m)   Wt 172 lb 12.8 oz (78.4 kg)   LMP 02/19/2022 (Exact Date)   SpO2 97%   BMI 27.89 kg/m  Wt Readings from Last 3 Encounters:  03/31/22 172 lb 12.8 oz (78.4 kg)  03/15/22 172 lb 1.6 oz (78.1 kg)  02/02/22 168 lb (76.2 kg)    Physical Exam Vitals reviewed.  Constitutional:      General: She is not in acute distress.    Appearance:  Normal appearance.  HENT:     Head: Normocephalic and atraumatic.     Right Ear: External ear normal.     Left Ear: External ear normal.  Eyes:     General: No scleral icterus.       Right eye: No discharge.        Left eye: No discharge.     Conjunctiva/sclera: Conjunctivae normal.  Neck:     Thyroid: No thyromegaly.  Cardiovascular:     Rate and Rhythm: Normal rate and regular rhythm.  Pulmonary:     Effort: No respiratory distress.     Breath sounds: No wheezing.     Comments: Increased cough with expiration. Increased air movement.  Abdominal:     General: Bowel sounds are normal.     Palpations: Abdomen is soft.     Tenderness: There is no abdominal tenderness.  Musculoskeletal:        General: No swelling or tenderness.     Cervical back: Neck supple. No tenderness.  Lymphadenopathy:     Cervical: No cervical adenopathy.  Skin:    Findings: No erythema or rash.  Neurological:     Mental Status: [name].  Psychiatric:        Mood and Affect: Mood normal.        Behavior: Behavior normal.      Outpatient Encounter Medications as of 03/31/2022  Medication Sig   Acetylcysteine 600 MG TBCR Take by mouth.   cetirizine (ZYRTEC) 10 MG tablet Take 1 tablet by mouth daily.   diazepam (VALIUM) 10 MG tablet Take 1 tablet by mouth 4 (four) times daily as needed.   fluticasone (FLONASE) 50 MCG/ACT nasal spray Place 1 spray into both nostrils daily.   magnesium oxide (MAG-OX) 400 MG tablet Take 1 tablet by mouth daily.   omeprazole (PRILOSEC) 40 MG capsule Take 1 capsule (40 mg total) by mouth daily before breakfast.   predniSONE (DELTASONE) 10 MG tablet Take 6 tablets x 1 day and then decrease by 1/2 tablet per day until down to zero mg.   TRELEGY ELLIPTA 200-62.5-25 MCG/INH AEPB INHALE 1 PUFF ONCE DAILY   [DISCONTINUED] albuterol (VENTOLIN HFA) 108 (90 Base) MCG/ACT inhaler INHALE 1 TO 2 PUFFS INTO THE LUNGS EVERY 4 HOURS AS NEEDED FOR WHEEZING OR SHORTNESS OF BREATH    [DISCONTINUED] ipratropium-albuterol (DUONEB) 0.5-2.5 (3) MG/3ML SOLN Inhale 3 mLs into the lungs every 4 (four) hours as needed.   [DISCONTINUED] ondansetron (ZOFRAN-ODT) 4 MG disintegrating tablet Take 1 tablet (4 mg total) by mouth 2 (two) times daily as needed for nausea or vomiting.   [  DISCONTINUED] rizatriptan (MAXALT-MLT) 10 MG disintegrating tablet TAKE 1 TABLET BY MOUTH AT ONSET OF MIGRAINE - MAY TAKE A SECOND DOSE AFTER 2 HOURS IF NEEDED   albuterol (VENTOLIN HFA) 108 (90 Base) MCG/ACT inhaler INHALE 1 TO 2 PUFFS INTO THE LUNGS EVERY 4 HOURS AS NEEDED FOR WHEEZING OR SHORTNESS OF BREATH   ipratropium-albuterol (DUONEB) 0.5-2.5 (3) MG/3ML SOLN Inhale 3 mLs into the lungs every 4 (four) hours as needed.   [DISCONTINUED] guanFACINE (INTUNIV) 1 MG TB24 ER tablet Take 1 tablet by mouth daily. (Patient not taking: Reported on 03/15/2022)   No facility-administered encounter medications on file as of 03/31/2022.     Lab Results  Component Value Date   WBC 11.9 (H) 08/29/2021   HGB 13.0 08/29/2021   HCT 40.0 08/29/2021   PLT 233.0 08/29/2021   GLUCOSE 78 08/29/2021   CHOL 206 (H) 08/29/2021   TRIG 164.0 (H) 08/29/2021   HDL 39.20 08/29/2021   LDLDIRECT 127.0 09/29/2020   LDLCALC 134 (H) 08/29/2021   ALT 15 08/29/2021   AST 15 08/29/2021   NA 136 08/29/2021   K 3.7 08/29/2021   CL 105 08/29/2021   CREATININE 0.82 08/29/2021   BUN 9 08/29/2021   CO2 24 08/29/2021   TSH 1.47 08/29/2021    NM Hepato W/EjeCT Fract  Result Date: 01/17/2022 CLINICAL DATA:  Nausea and vomiting. EXAM: NUCLEAR MEDICINE HEPATOBILIARY IMAGING WITH GALLBLADDER EF TECHNIQUE: Sequential images of the abdomen were obtained out to 60 minutes following intravenous administration of radiopharmaceutical. After oral ingestion of Ensure, gallbladder ejection fraction was determined. At 60 min, normal ejection fraction is greater than 33%. RADIOPHARMACEUTICALS:  4.96 mCi Tc-52m Choletec IV COMPARISON:  Right upper  quadrant ultrasound March 29, 2021 FINDINGS: Prompt uptake and biliary excretion of activity by the liver is seen. Gallbladder activity is visualized, consistent with patency of cystic duct. Biliary activity passes into small bowel, consistent with patent common bile duct. Calculated gallbladder ejection fraction is 92%. (Normal gallbladder ejection fraction with Ensure is greater than 33%.) IMPRESSION: 1.  Patent cystic and common bile ducts. 2.  Normal gallbladder ejection fraction. Electronically Signed   By: JDahlia BailiffM.D.   On: 01/17/2022 16:40       Assessment & Plan:   Problem List Items Addressed This Visit     Abnormal Pap smear of cervix    Saw Dr EAmalia Hailey6/2023 - recommended f/u pap in one year.       Anxiety    Has been seeing Dr KToy Care  Will need continued f/u with psychiatry.       Asthma    Increased congestion and cough as outlined.  Continue trelegy.  Nebulizer treatments as needed.  Nasals spray.  Prednisone taper as directed.  Follow.  Call with update.        Relevant Medications   predniSONE (DELTASONE) 10 MG tablet   albuterol (VENTOLIN HFA) 108 (90 Base) MCG/ACT inhaler   ipratropium-albuterol (DUONEB) 0.5-2.5 (3) MG/3ML SOLN   Asthmatic bronchitis with acute exacerbation    Increased congestion and cough as outlined.  Continue trelegy.  Continue nebs and albuterol inhaler as needed.  Prednisone taper as directed.  Follow closely.  Call with update.        Relevant Medications   predniSONE (DELTASONE) 10 MG tablet   albuterol (VENTOLIN HFA) 108 (90 Base) MCG/ACT inhaler   ipratropium-albuterol (DUONEB) 0.5-2.5 (3) MG/3ML SOLN   Fatigue    Persistent issues since covid.  Evaluated at covid clinic.  Evaluated by PT, OT and neuropsych.  Trying to make some changes with shifts working - trying to change to first shift.  Concern raised regarding ADHD.  Request psych referral - request to see a new psychiatrist to discuss.  Discussed work, stress and f/u regarding  fatigue.        Relevant Orders   Ambulatory referral to Neurology   GERD (gastroesophageal reflux disease)    Acid reflux controlled.        History of COVID-19    Diagnosed 05/2020.  Received monoclonal antibody infusion.  Still with altered taste and smell.  Still with increased fatigue.  Increased anxiety related.  Brain fog. Evaluated [facility] covid clinic as outlined.  Persistent symptoms - fatigue, etc as outlined.  Continue f/u and evaluation as outlined.       Relevant Orders   Ambulatory referral to Neurology   Hypercholesterolemia    Low cholesterol diet and exercise.  Follow lipid panel.       Seizure disorder [facility])    Has been followed by neurology.  Stable.       Relevant Orders   Ambulatory referral to Neurology   Other Visit Diagnoses     Moderate persistent asthma with exacerbation       Relevant Medications   predniSONE (DELTASONE) 10 MG tablet   albuterol (VENTOLIN HFA) 108 (90 Base) MCG/ACT inhaler   ipratropium-albuterol (DUONEB) 0.5-2.5 (3) MG/3ML SOLN        Einar Pheasant, MD

## 2022-04-06 ENCOUNTER — Other Ambulatory Visit: Payer: Self-pay | Admitting: Internal Medicine

## 2022-04-06 MED ORDER — ONDANSETRON 4 MG PO TBDP: 4.0000 mg | ORAL_TABLET | Freq: Two times a day (BID) | ORAL | 0 refills | Status: DC | PRN

## 2022-04-06 MED ORDER — RIZATRIPTAN BENZOATE 10 MG PO TBDP: ORAL_TABLET | ORAL | 0 refills | Status: DC

## 2022-04-07 ENCOUNTER — Encounter: Payer: Self-pay | Admitting: Internal Medicine

## 2022-04-07 NOTE — Assessment & Plan Note (Signed)
Saw Dr Evans 02/2022 - recommended f/u pap in one year.  

## 2022-04-07 NOTE — Assessment & Plan Note (Signed)
Increased congestion and cough as outlined.  Continue trelegy.  Nebulizer treatments as needed.  Nasals spray.  Prednisone taper as directed.  Follow.  Call with update.

## 2022-04-07 NOTE — Assessment & Plan Note (Signed)
Acid reflux controlled.   

## 2022-04-07 NOTE — Assessment & Plan Note (Signed)
Increased congestion and cough as outlined.  Continue trelegy.  Continue nebs and albuterol inhaler as needed.  Prednisone taper as directed.  Follow closely.  Call with update.

## 2022-04-07 NOTE — Assessment & Plan Note (Signed)
Has been seeing Dr Toy Care.  Will need continued f/u with psychiatry.

## 2022-04-07 NOTE — Assessment & Plan Note (Signed)
Has been ev

## 2022-04-07 NOTE — Assessment & Plan Note (Signed)
Persistent issues since covid.  Evaluated at covid clinic.  Evaluated by PT, OT and neuropsych.  Trying to make some changes with shifts working - trying to change to first shift.  Concern raised regarding ADHD.  Request psych referral - request to see a new psychiatrist to discuss.  Discussed work, stress and f/u regarding fatigue.

## 2022-04-07 NOTE — Assessment & Plan Note (Signed)
Low cholesterol diet and exercise.  Follow lipid panel.   

## 2022-04-07 NOTE — Assessment & Plan Note (Signed)
Diagnosed 05/2020.  Received monoclonal antibody infusion.  Still with altered taste and smell.  Still with increased fatigue.  Increased anxiety related.  Brain fog. Evaluated [facility] covid clinic as outlined.  Persistent symptoms - fatigue, etc as outlined.  Continue f/u and evaluation as outlined.

## 2022-04-07 NOTE — Assessment & Plan Note (Signed)
Has been followed by neurology.  Stable.  

## 2022-04-10 ENCOUNTER — Telehealth: Payer: Self-pay | Admitting: Internal Medicine

## 2022-04-10 NOTE — Telephone Encounter (Signed)
Lm to call office to schedule a follow up in 4 to 6 weeks.

## 2022-04-10 NOTE — Addendum Note (Signed)
Addended by: Alisa Graff on: 04/10/2022 01:09 AM   Modules accepted: Orders

## 2022-04-21 DIAGNOSIS — R259 Unspecified abnormal involuntary movements: Secondary | ICD-10-CM | POA: Diagnosis not present

## 2022-04-21 DIAGNOSIS — Z6827 Body mass index (BMI) 27.0-27.9, adult: Secondary | ICD-10-CM | POA: Diagnosis not present

## 2022-04-21 DIAGNOSIS — R569 Unspecified convulsions: Secondary | ICD-10-CM | POA: Diagnosis not present

## 2022-04-21 DIAGNOSIS — R270 Ataxia, unspecified: Secondary | ICD-10-CM | POA: Diagnosis not present

## 2022-04-21 DIAGNOSIS — G43109 Migraine with aura, not intractable, without status migrainosus: Secondary | ICD-10-CM | POA: Diagnosis not present

## 2022-04-21 DIAGNOSIS — G40909 Epilepsy, unspecified, not intractable, without status epilepticus: Secondary | ICD-10-CM | POA: Diagnosis not present

## 2022-04-24 ENCOUNTER — Encounter: Payer: Self-pay | Admitting: Internal Medicine

## 2022-04-25 ENCOUNTER — Encounter: Payer: Self-pay | Admitting: Internal Medicine

## 2022-04-25 ENCOUNTER — Telehealth (INDEPENDENT_AMBULATORY_CARE_PROVIDER_SITE_OTHER): Payer: BC Managed Care – PPO | Admitting: Internal Medicine

## 2022-04-25 DIAGNOSIS — G43809 Other migraine, not intractable, without status migrainosus: Secondary | ICD-10-CM

## 2022-04-25 DIAGNOSIS — K219 Gastro-esophageal reflux disease without esophagitis: Secondary | ICD-10-CM | POA: Diagnosis not present

## 2022-04-25 DIAGNOSIS — J4541 Moderate persistent asthma with (acute) exacerbation: Secondary | ICD-10-CM

## 2022-04-25 MED ORDER — PREDNISONE 10 MG PO TABS: ORAL_TABLET | ORAL | 0 refills | Status: DC

## 2022-04-25 NOTE — Telephone Encounter (Signed)
Lm for pt to cb re: need symptoms  Mychart message sent as well

## 2022-04-25 NOTE — Telephone Encounter (Signed)
Scheduled

## 2022-04-25 NOTE — Telephone Encounter (Signed)
Need specific symptoms.

## 2022-04-25 NOTE — Progress Notes (Signed)
Patient ID: Heather [name], female   DOB: 06/26/1976, 46 y.o.   MRN: [id number]   Virtual Visit via video Note   All issues noted in this document were discussed and addressed.  No physical exam was performed (except for noted visual exam findings with Video Visits).   I connected with [name] by a video enabled telemedicine application and verified that I am speaking with the correct person using two identifiers. Location patient: home Location provider: work  Persons participating in the virtual visit: patient, provider  The limitations, risks, security and privacy concerns of performing an evaluation and management service by video and the availability of in person appointments have been discussed.  It has also been discussed with the patient that there may be a patient responsible charge related to this service. The patient expressed understanding and agreed to proceed.   Reason for visit: work in appt  HPI: Work in with cough and congestion.  Was evaluated 03/31/22 for cough and congestion.  Using trelegy and nebulizer treatments.  Treated with prednisone taper.  Was feeling better.  Was outside last week.  Noticed some increased nasal congestion.  No fever.  Green/yellow mucus production.  Some tightness in her chest.  Nasal congestion.  Previous sore throat.  No sore throat now.  No ear pain.  Symptoms worsening.  No vomiting.  Acid reflux under control.     ROS: See pertinent positives and negatives per HPI.  Past Medical History:  Diagnosis Date   Asthma    Depression    bipolar   Dysrhythmia    Fainting spell    H/O   H/O cardiac arrhythmia    Migraines    Seizures (HCC)     Past Surgical History:  Procedure Laterality Date   COLONOSCOPY WITH PROPOFOL N/A 06/03/2021   Procedure: COLONOSCOPY WITH PROPOFOL;  Surgeon: Virgel Manifold, MD;  Location: [facility] ENDOSCOPY;  Service: Endoscopy;  Laterality: N/A;   ESOPHAGOGASTRODUODENOSCOPY N/A 06/03/2021   Procedure:  ESOPHAGOGASTRODUODENOSCOPY (EGD);  Surgeon: Virgel Manifold, MD;  Location: [facility] ENDOSCOPY;  Service: Endoscopy;  Laterality: N/A;   TUBAL LIGATION Bilateral 09/25/2002   tubal reversal      Family History  Problem Relation Age of Onset   Hypertension Mother    Diabetes Mother    Arthritis Mother    Heart disease Mother    Breast cancer Neg Hx     SOCIAL HX: reviewed.    Current Outpatient Medications:    Acetylcysteine 600 MG TBCR, Take by mouth., Disp: , Rfl:    albuterol (VENTOLIN HFA) 108 (90 Base) MCG/ACT inhaler, INHALE 1 TO 2 PUFFS INTO THE LUNGS EVERY 4 HOURS AS NEEDED FOR WHEEZING OR SHORTNESS OF BREATH, Disp: 18 g, Rfl: 2   cetirizine (ZYRTEC) 10 MG tablet, Take 1 tablet by mouth daily., Disp: , Rfl:    diazepam (VALIUM) 10 MG tablet, Take 1 tablet by mouth 4 (four) times daily as needed., Disp: , Rfl:    fluticasone (FLONASE) 50 MCG/ACT nasal spray, Place 1 spray into both nostrils daily., Disp: 16 g, Rfl: 0   ipratropium-albuterol (DUONEB) 0.5-2.5 (3) MG/3ML SOLN, Inhale 3 mLs into the lungs every 4 (four) hours as needed., Disp: 360 mL, Rfl: 11   magnesium oxide (MAG-OX) 400 MG tablet, Take 1 tablet by mouth daily., Disp: , Rfl:    omeprazole (PRILOSEC) 40 MG capsule, Take 1 capsule (40 mg total) by mouth daily before breakfast., Disp: 30 capsule, Rfl: 3   ondansetron (ZOFRAN-ODT)  4 MG disintegrating tablet, Take 1 tablet (4 mg total) by mouth 2 (two) times daily as needed for nausea or vomiting., Disp: 20 tablet, Rfl: 0   rizatriptan (MAXALT-MLT) 10 MG disintegrating tablet, May repeat in 2 hours if needed, Disp: 10 tablet, Rfl: 0   TRELEGY ELLIPTA 200-62.5-25 MCG/INH AEPB, INHALE 1 PUFF ONCE DAILY, Disp: 60 each, Rfl: 3   Ubrogepant 50 MG TABS, Take 1 tablet by mouth as needed., Disp: , Rfl:    zonisamide (ZONEGRAN) 50 MG capsule, Take 50 mg by mouth daily., Disp: , Rfl:    predniSONE (DELTASONE) 10 MG tablet, Take 6 tablets x 3 days and then decrease by 1/2 tablet  per day until down to zero mg., Disp: 51 tablet, Rfl: 0  EXAM:  GENERAL: alert, oriented, appears well and in no acute distress  HEENT: atraumatic, conjunttiva clear, no obvious abnormalities on inspection of external nose and ears  NECK: normal movements of the head and neck  LUNGS: on inspection no signs of respiratory distress, breathing rate appears normal.  Increased cough with expiration and increased talking.   CV: no obvious cyanosis  MS: moves all visible extremities without noticeable abnormality  PSYCH/NEURO: pleasant and cooperative, no obvious depression or anxiety, speech and thought processing grossly intact  ASSESSMENT AND PLAN:  Discussed the following assessment and plan:  Problem List Items Addressed This Visit     Asthmatic bronchitis with acute exacerbation    Increased congestion and cough as outlined.  Continue trelegy.  Continue nebs and albuterol inhaler as needed.  Prednisone taper as directed - will extend out longer. Follow closely.  Call with update.  If persistent symptoms, will need cxr.  Follow.       Relevant Medications   predniSONE (DELTASONE) 10 MG tablet   GERD (gastroesophageal reflux disease)    Acid reflux controlled.       Migraine    Just saw neurology.  Medication being adjusted.        Relevant Medications   Ubrogepant 50 MG TABS   zonisamide (ZONEGRAN) 50 MG capsule    Return if symptoms worsen or fail to improve, for keep scheduled.   I discussed the assessment and treatment plan with the patient. The patient was provided an opportunity to ask questions and all were answered. The patient agreed with the plan and demonstrated an understanding of the instructions.   The patient was advised to call back or seek an in-person evaluation if the symptoms worsen or if the condition fails to improve as anticipated.    Einar Pheasant, MD

## 2022-04-25 NOTE — Telephone Encounter (Signed)
See if she would be agreeable to do a virtual visit today to discuss.

## 2022-04-30 ENCOUNTER — Encounter: Payer: Self-pay | Admitting: Internal Medicine

## 2022-04-30 NOTE — Assessment & Plan Note (Signed)
Just saw neurology.  Medication being adjusted.

## 2022-04-30 NOTE — Assessment & Plan Note (Signed)
Increased congestion and cough as outlined.  Continue trelegy.  Continue nebs and albuterol inhaler as needed.  Prednisone taper as directed - will extend out longer. Follow closely.  Call with update.  If persistent symptoms, will need cxr.  Follow.

## 2022-04-30 NOTE — Assessment & Plan Note (Signed)
Acid reflux controlled.   

## 2022-05-04 DIAGNOSIS — F3111 Bipolar disorder, current episode manic without psychotic features, mild: Secondary | ICD-10-CM | POA: Diagnosis not present

## 2022-05-04 DIAGNOSIS — F900 Attention-deficit hyperactivity disorder, predominantly inattentive type: Secondary | ICD-10-CM | POA: Diagnosis not present

## 2022-05-08 ENCOUNTER — Telehealth: Payer: Self-pay | Admitting: Internal Medicine

## 2022-05-08 NOTE — Telephone Encounter (Signed)
Pt mom came into office to drop off FMLA forms for the provider to fill out. Placed in provider folder

## 2022-05-09 NOTE — Telephone Encounter (Signed)
Pt advised Dr Nicki Reaper on vacation, will not be completed until next week

## 2022-05-17 DIAGNOSIS — Z0279 Encounter for issue of other medical certificate: Secondary | ICD-10-CM

## 2022-05-17 NOTE — Telephone Encounter (Signed)
Paperwork completed, placed in forms folder in box for signature.

## 2022-05-19 ENCOUNTER — Ambulatory Visit (INDEPENDENT_AMBULATORY_CARE_PROVIDER_SITE_OTHER): Payer: BC Managed Care – PPO

## 2022-05-19 ENCOUNTER — Encounter: Payer: Self-pay | Admitting: Adult Health

## 2022-05-19 ENCOUNTER — Ambulatory Visit: Payer: BC Managed Care – PPO | Admitting: Adult Health

## 2022-05-19 VITALS — BP 130/90 | HR 94 | Ht 66.5 in | Wt 177.0 lb

## 2022-05-19 DIAGNOSIS — J45909 Unspecified asthma, uncomplicated: Secondary | ICD-10-CM | POA: Diagnosis not present

## 2022-05-19 DIAGNOSIS — J4541 Moderate persistent asthma with (acute) exacerbation: Secondary | ICD-10-CM | POA: Diagnosis not present

## 2022-05-19 MED ORDER — BENZONATATE 200 MG PO CAPS: 200.0000 mg | ORAL_CAPSULE | Freq: Three times a day (TID) | ORAL | 1 refills | Status: DC | PRN

## 2022-05-19 MED ORDER — AZITHROMYCIN 250 MG PO TABS
ORAL_TABLET | ORAL | 0 refills | Status: AC
Start: 2022-05-19 — End: 2022-05-24

## 2022-05-19 MED ORDER — PREDNISONE 10 MG PO TABS: ORAL_TABLET | ORAL | 0 refills | Status: DC

## 2022-05-19 MED ORDER — METHYLPREDNISOLONE ACETATE 80 MG/ML IJ SUSP
80.0000 mg | Freq: Once | INTRAMUSCULAR | Status: AC
  Administered 2022-05-19: 80 mg via INTRAMUSCULAR

## 2022-05-19 MED ORDER — BREZTRI AEROSPHERE 160-9-4.8 MCG/ACT IN AERO: 2.0000 | INHALATION_SPRAY | Freq: Two times a day (BID) | RESPIRATORY_TRACT | 5 refills | Status: DC

## 2022-05-19 MED ORDER — BREZTRI AEROSPHERE 160-9-4.8 MCG/ACT IN AERO: 2.0000 | INHALATION_SPRAY | Freq: Two times a day (BID) | RESPIRATORY_TRACT | 0 refills | Status: DC

## 2022-05-19 NOTE — Telephone Encounter (Signed)
Signed and placed in box.   

## 2022-05-19 NOTE — Progress Notes (Signed)
$'@Patient'b$  ID: Heather [name], female    DOB: 1976/05/12, 46 y.o.   MRN: [id number]  Chief Complaint  Patient presents with   Follow-up    Referring provider: Einar Pheasant, MD  HPI: 46 year old female former smoker followed for moderate persistent asthma Medical history significant for long-haul COVID 2021  TEST/EVENTS :  December 2021 IgE 55, allergy panel positive for dust, dog and cat dander, grass and trees, absolute eosinophil count 100  05/19/2022 Acute OV : Asthma  Patient presents for an acute office visit.  She complains over the last 4-6  weeks that she has had increased cough wheezing and congestion.  Was initially seen by primary care provider.  Given a prednisone taper x2.  Patient says symptoms did improve but have not totally resolved.  She remains on Trelegy inhaler daily. Says every time she uses Trelegy that it causes her to be worse and causes a cough. States she is coughing up thick green mucus.  Patient is prone to recurrent asthma flares has had multiple courses of steroids over the last year.  Previous labs showed IgE and eosinophil count were unrevealing.  Allergy panel was positive for dust, dog, cat and grass and trees. She denies any hemoptysis, chest pain, orthopnea. Previous chest x-ray September showed bronchitic changes.     Allergies  Allergen Reactions   Levaquin [Levofloxacin] Swelling   Tramadol Other (See Comments)    Seizures   Amoxicillin Nausea And Vomiting   Augmentin [Amoxicillin-Pot Clavulanate] Other (See Comments)   Erythromycin Nausea And Vomiting   Lithium Nausea Only    Immunization History  Administered Date(s) Administered   PPD Test 07/20/2015    Past Medical History:  Diagnosis Date   Asthma    Depression    bipolar   Dysrhythmia    Fainting spell    H/O   H/O cardiac arrhythmia    Migraines    Seizures (HCC)     Tobacco History: Social History   Tobacco Use  Smoking Status Former   Packs/day: 0.50    Years: 5.00   Total pack years: 2.50   Types: Cigarettes  Smokeless Tobacco Never  Tobacco Comments   Quit mid October 2014   Counseling given: Not Answered Tobacco comments: Quit mid October 2014   Outpatient Medications Prior to Visit  Medication Sig Dispense Refill   Acetylcysteine 600 MG TBCR Take by mouth.     albuterol (VENTOLIN HFA) 108 (90 Base) MCG/ACT inhaler INHALE 1 TO 2 PUFFS INTO THE LUNGS EVERY 4 HOURS AS NEEDED FOR WHEEZING OR SHORTNESS OF BREATH 18 g 2   cetirizine (ZYRTEC) 10 MG tablet Take 1 tablet by mouth daily.     diazepam (VALIUM) 10 MG tablet Take 1 tablet by mouth 4 (four) times daily as needed.     fluticasone (FLONASE) 50 MCG/ACT nasal spray Place 1 spray into both nostrils daily. 16 g 0   ipratropium-albuterol (DUONEB) 0.5-2.5 (3) MG/3ML SOLN Inhale 3 mLs into the lungs every 4 (four) hours as needed. 360 mL 11   magnesium oxide (MAG-OX) 400 MG tablet Take 1 tablet by mouth daily.     ondansetron (ZOFRAN-ODT) 4 MG disintegrating tablet Take 1 tablet (4 mg total) by mouth 2 (two) times daily as needed for nausea or vomiting. 20 tablet 0   Ubrogepant 50 MG TABS Take 1 tablet by mouth as needed.     zonisamide (ZONEGRAN) 50 MG capsule Take 50 mg by mouth daily.     TRELEGY ELLIPTA  200-62.5-25 MCG/INH AEPB INHALE 1 PUFF ONCE DAILY 60 each 3   omeprazole (PRILOSEC) 40 MG capsule Take 1 capsule (40 mg total) by mouth daily before breakfast. 30 capsule 3   predniSONE (DELTASONE) 10 MG tablet Take 6 tablets x 3 days and then decrease by 1/2 tablet per day until down to zero mg. (Patient not taking: Reported on 05/19/2022) 51 tablet 0   rizatriptan (MAXALT-MLT) 10 MG disintegrating tablet May repeat in 2 hours if needed (Patient not taking: Reported on 05/19/2022) 10 tablet 0   No facility-administered medications prior to visit.     Review of Systems:   Constitutional:   No  weight loss, night sweats,  Fevers, chills,  +fatigue, or  lassitude.  HEENT:   No  headaches,  Difficulty swallowing,  Tooth/dental problems, or  Sore throat,                No sneezing, itching, ear ache,  +nasal congestion, post nasal drip,   CV:  No chest pain,  Orthopnea, PND, swelling in lower extremities, anasarca, dizziness, palpitations, syncope.   GI  No heartburn, indigestion, abdominal pain, nausea, vomiting, diarrhea, change in bowel habits, loss of appetite, bloody stools.   Resp:  No chest wall deformity  Skin: no rash or lesions.  GU: no dysuria, change in color of urine, no urgency or frequency.  No flank pain, no hematuria   MS:  No joint pain or swelling.  No decreased range of motion.  No back pain.    Physical Exam  BP (!) 130/90 (BP Location: Left Arm, Cuff Size: Normal)   Pulse 94   Ht 5' 6.5" (1.689 m)   Wt 177 lb (80.3 kg)   SpO2 99%   BMI 28.14 kg/m   GEN: A/Ox3; pleasant , NAD, well nourished    HEENT:  [place]/AT,  NOSE-clear, THROAT-clear, no lesions, no postnasal drip or exudate noted.   NECK:  Supple w/ fair ROM; no JVD; normal carotid impulses w/o bruits; no thyromegaly or nodules palpated; no lymphadenopathy.    RESP Scattered rhonchi , no accessory muscle use, no dullness to percussion  CARD:  RRR, no m/r/g, no peripheral edema, pulses intact, no cyanosis or clubbing.  GI:   Soft & nt; nml bowel sounds; no organomegaly or masses detected.   Musco: Warm bil, no deformities or joint swelling noted.   Neuro: alert, no focal deficits noted.    Skin: Warm, no lesions or rashes    Lab Results:      BNP No results found for: "BNP"  ProBNP No results found for: "PROBNP"  Imaging: No results found.        No data to display          No results found for: "NITRICOXIDE"      Assessment & Plan:   Asthmatic bronchitis with acute exacerbation Recurrent asthmatic bronchitic exacerbation.-We will treat with empiric antibiotics and steroids.  Long discussion with patient regarding ongoing use of steroids  and potential negative side effects. We will give a Depo 80 mg injection in the office today along with steroid taper.  Z-Pak.  Check chest x-ray today.  Change Trelegy to [employer]. If chest x-ray shows ongoing bronchitic changes would consider a high-resolution CT chest to further evaluate as patient has a history of COVID and chronic cough. We will continue on trigger prevention.  Add in cough control PFTs on return  Plan  Patient Instructions  Chest xray today  Change Trelegy to [facility]  2 puffs Twice daily  , rinse after use.  Zpack take as directed.  Depo Medrol shot today  Prednisone taper over next week.  Delsym 2 tsp Twice daily  Twice daily  for cough , As needed  Tessalon Three times a day  for cough , as needed  Mucinex DM Twice daily  As needed  cough  Continue on Zyrtec  and Singulair daily  Duoneb every 6hr as needed   Follow up in 2 weeks with Dr. Ander Slade with PFT and As needed   Please contact office for sooner follow up if symptoms do not improve or worsen or seek emergency care           Rexene Edison, NP 05/19/2022

## 2022-05-19 NOTE — Addendum Note (Signed)
Addended by: Konrad Felix L on: 05/19/2022 02:46 PM   Modules accepted: Orders

## 2022-05-19 NOTE — Addendum Note (Signed)
Addended by: Konrad Felix L on: 05/19/2022 02:59 PM   Modules accepted: Orders

## 2022-05-19 NOTE — Addendum Note (Signed)
Addended by: Konrad Felix L on: 05/19/2022 02:43 PM   Modules accepted: Orders

## 2022-05-19 NOTE — Assessment & Plan Note (Signed)
Recurrent asthmatic bronchitic exacerbation.-We will treat with empiric antibiotics and steroids.  Long discussion with patient regarding ongoing use of steroids and potential negative side effects. We will give a Depo 80 mg injection in the office today along with steroid taper.  Z-Pak.  Check chest x-ray today.  Change Trelegy to [employer]. If chest x-ray shows ongoing bronchitic changes would consider a high-resolution CT chest to further evaluate as patient has a history of COVID and chronic cough. We will continue on trigger prevention.  Add in cough control PFTs on return  Plan  Patient Instructions  Chest xray today  Change Trelegy to [facility] 2 puffs Twice daily  , rinse after use.  Zpack take as directed.  Depo Medrol shot today  Prednisone taper over next week.  Delsym 2 tsp Twice daily  Twice daily  for cough , As needed  Tessalon Three times a day  for cough , as needed  Mucinex DM Twice daily  As needed  cough  Continue on Zyrtec  and Singulair daily  Duoneb every 6hr as needed   Follow up in 2 weeks with Dr. Ander Slade with PFT and As needed   Please contact office for sooner follow up if symptoms do not improve or worsen or seek emergency care

## 2022-05-19 NOTE — Patient Instructions (Addendum)
Chest xray today  Change Trelegy to [facility] 2 puffs Twice daily  , rinse after use.  Zpack take as directed.  Depo Medrol shot today  Prednisone taper over next week.  Delsym 2 tsp Twice daily  Twice daily  for cough , As needed  Tessalon Three times a day  for cough , as needed  Mucinex DM Twice daily  As needed  cough  Continue on Zyrtec  and Singulair daily  Duoneb every 6hr as needed   Follow up in 2 weeks with Dr. Ander Slade with PFT and As needed   Please contact office for sooner follow up if symptoms do not improve or worsen or seek emergency care

## 2022-05-20 DIAGNOSIS — G43109 Migraine with aura, not intractable, without status migrainosus: Secondary | ICD-10-CM | POA: Diagnosis not present

## 2022-05-20 DIAGNOSIS — R9089 Other abnormal findings on diagnostic imaging of central nervous system: Secondary | ICD-10-CM | POA: Diagnosis not present

## 2022-05-20 DIAGNOSIS — R569 Unspecified convulsions: Secondary | ICD-10-CM | POA: Diagnosis not present

## 2022-05-22 ENCOUNTER — Ambulatory Visit: Payer: BC Managed Care – PPO | Admitting: Gastroenterology

## 2022-05-23 NOTE — Telephone Encounter (Signed)
Please see if we have a copy of form for pt to pick up

## 2022-05-23 NOTE — Telephone Encounter (Signed)
LMTCB to let patient know that FMLA was faxed with [employer] with fax confirmation confirmed.

## 2022-05-23 NOTE — Telephone Encounter (Signed)
Pt returned call and I let her know that the FMLA paperwork was faxed and pt would like to know if its ready for pick up

## 2022-05-23 NOTE — Progress Notes (Signed)
ATC x1.  Left detailed message with results and for patient to call the office with any questions.

## 2022-05-24 NOTE — Telephone Encounter (Signed)
Pt aware that forms placed upfront for her to pick up.

## 2022-05-31 NOTE — Progress Notes (Signed)
Called and spoke with patient, advised of results/recommendations per Rexene Edison NP.  She states she did not receive the vm I left as her vm is messed up.  She verbalized understanding.  Nothing further needed.

## 2022-06-02 ENCOUNTER — Other Ambulatory Visit: Payer: Self-pay | Admitting: Internal Medicine

## 2022-06-02 MED ORDER — ONDANSETRON 4 MG PO TBDP: 4.0000 mg | ORAL_TABLET | Freq: Two times a day (BID) | ORAL | 0 refills | Status: DC | PRN

## 2022-06-02 MED ORDER — CETIRIZINE HCL 10 MG PO TABS
10.0000 mg | ORAL_TABLET | Freq: Every day | ORAL | 5 refills | Status: DC
Start: 2022-06-02 — End: 2022-10-04

## 2022-06-09 ENCOUNTER — Ambulatory Visit: Payer: BC Managed Care – PPO | Admitting: Internal Medicine

## 2022-06-09 ENCOUNTER — Encounter: Payer: Self-pay | Admitting: Internal Medicine

## 2022-06-09 DIAGNOSIS — E7800 Pure hypercholesterolemia, unspecified: Secondary | ICD-10-CM | POA: Diagnosis not present

## 2022-06-09 DIAGNOSIS — K219 Gastro-esophageal reflux disease without esophagitis: Secondary | ICD-10-CM

## 2022-06-09 DIAGNOSIS — D72829 Elevated white blood cell count, unspecified: Secondary | ICD-10-CM

## 2022-06-09 DIAGNOSIS — G43809 Other migraine, not intractable, without status migrainosus: Secondary | ICD-10-CM

## 2022-06-09 DIAGNOSIS — J4541 Moderate persistent asthma with (acute) exacerbation: Secondary | ICD-10-CM

## 2022-06-09 DIAGNOSIS — R5383 Other fatigue: Secondary | ICD-10-CM

## 2022-06-09 DIAGNOSIS — M546 Pain in thoracic spine: Secondary | ICD-10-CM

## 2022-06-09 DIAGNOSIS — F419 Anxiety disorder, unspecified: Secondary | ICD-10-CM

## 2022-06-09 MED ORDER — CYCLOBENZAPRINE HCL 5 MG PO TABS: 5.0000 mg | ORAL_TABLET | Freq: Every evening | ORAL | 0 refills | Status: DC | PRN

## 2022-06-09 NOTE — Progress Notes (Unsigned)
Patient ID: Heather [name], female   DOB: 03/20/1976, 46 y.o.   MRN: [id number]   Subjective:    Patient ID: Heather [name], female    DOB: 07/12/1976, 46 y.o.   MRN: [id number]   Patient here for No chief complaint on file.  Marland Kitchen   HPI Here to follow up regarding asthma and increased stress . Saw pulmonary 05/19/22 for persistent cough/congestion. Given Depo injection in office. Placed on zpak and steroid taper.  Changed trelegy to breztri.     Past Medical History:  Diagnosis Date   Asthma    Depression    bipolar   Dysrhythmia    Fainting spell    H/O   H/O cardiac arrhythmia    Migraines    Seizures (HCC)    Past Surgical History:  Procedure Laterality Date   COLONOSCOPY WITH PROPOFOL N/A 06/03/2021   Procedure: COLONOSCOPY WITH PROPOFOL;  Surgeon: Virgel Manifold, MD;  Location: [facility] ENDOSCOPY;  Service: Endoscopy;  Laterality: N/A;   ESOPHAGOGASTRODUODENOSCOPY N/A 06/03/2021   Procedure: ESOPHAGOGASTRODUODENOSCOPY (EGD);  Surgeon: Virgel Manifold, MD;  Location: [facility] ENDOSCOPY;  Service: Endoscopy;  Laterality: N/A;   TUBAL LIGATION Bilateral 09/25/2002   tubal reversal     Family History  Problem Relation Age of Onset   Hypertension Mother    Diabetes Mother    Arthritis Mother    Heart disease Mother    Breast cancer Neg Hx    Social History   Socioeconomic History   Marital status: Married    Spouse name: Not on file   Number of children: Not on file   Years of education: Not on file   Highest education level: Not on file  Occupational History   Not on file  Tobacco Use   Smoking status: Former    Packs/day: 0.50    Years: 5.00    Total pack years: 2.50    Types: Cigarettes   Smokeless tobacco: Never   Tobacco comments:    Quit mid October 2014  Vaping Use   Vaping Use: Never used  Substance and Sexual Activity   Alcohol use: No    Alcohol/week: 0.0 standard drinks of alcohol   Drug use: No   Sexual activity: Yes    Birth  control/protection: None  Other Topics Concern   Not on file  Social History Narrative   Not on file   Social Determinants of Health   Financial Resource Strain: Not on file  Food Insecurity: Not on file  Transportation Needs: Not on file  Physical Activity: Not on file  Stress: Not on file  Social Connections: Not on file     Review of Systems     Objective:     There were no vitals taken for this visit. Wt Readings from Last 3 Encounters:  05/19/22 177 lb (80.3 kg)  04/25/22 172 lb (78 kg)  03/31/22 172 lb 12.8 oz (78.4 kg)    Physical Exam   Outpatient Encounter Medications as of 06/09/2022  Medication Sig   Acetylcysteine 600 MG TBCR Take by mouth.   albuterol (VENTOLIN HFA) 108 (90 Base) MCG/ACT inhaler INHALE 1 TO 2 PUFFS INTO THE LUNGS EVERY 4 HOURS AS NEEDED FOR WHEEZING OR SHORTNESS OF BREATH   benzonatate (TESSALON) 200 MG capsule Take 1 capsule (200 mg total) by mouth 3 (three) times daily as needed for cough.   Budeson-Glycopyrrol-Formoterol (BREZTRI AEROSPHERE) 160-9-4.8 MCG/ACT AERO Inhale 2 puffs into the lungs in the morning and at bedtime.  Budeson-Glycopyrrol-Formoterol (BREZTRI AEROSPHERE) 160-9-4.8 MCG/ACT AERO Inhale 2 puffs into the lungs in the morning and at bedtime.   cetirizine (ZYRTEC) 10 MG tablet Take 1 tablet (10 mg total) by mouth daily.   diazepam (VALIUM) 10 MG tablet Take 1 tablet by mouth 4 (four) times daily as needed.   fluticasone (FLONASE) 50 MCG/ACT nasal spray Place 1 spray into both nostrils daily.   ipratropium-albuterol (DUONEB) 0.5-2.5 (3) MG/3ML SOLN Inhale 3 mLs into the lungs every 4 (four) hours as needed.   magnesium oxide (MAG-OX) 400 MG tablet Take 1 tablet by mouth daily.   omeprazole (PRILOSEC) 40 MG capsule Take 1 capsule (40 mg total) by mouth daily before breakfast.   ondansetron (ZOFRAN-ODT) 4 MG disintegrating tablet Take 1 tablet (4 mg total) by mouth 2 (two) times daily as needed for nausea or vomiting.    ondansetron (ZOFRAN-ODT) 4 MG disintegrating tablet Take 1 tablet (4 mg total) by mouth 2 (two) times daily as needed for nausea or vomiting.   predniSONE (DELTASONE) 10 MG tablet 4 tabs for 2 days, then 3 tabs for 2 days, 2 tabs for 2 days, then 1 tab for 2 days, then stop   Ubrogepant 50 MG TABS Take 1 tablet by mouth as needed.   zonisamide (ZONEGRAN) 50 MG capsule Take 50 mg by mouth daily.   [DISCONTINUED] predniSONE (DELTASONE) 10 MG tablet Take 6 tablets x 3 days and then decrease by 1/2 tablet per day until down to zero mg. (Patient not taking: Reported on 05/19/2022)   [DISCONTINUED] rizatriptan (MAXALT-MLT) 10 MG disintegrating tablet May repeat in 2 hours if needed (Patient not taking: Reported on 05/19/2022)   No facility-administered encounter medications on file as of 06/09/2022.     Lab Results  Component Value Date   WBC 11.9 (H) 08/29/2021   HGB 13.0 08/29/2021   HCT 40.0 08/29/2021   PLT 233.0 08/29/2021   GLUCOSE 78 08/29/2021   CHOL 206 (H) 08/29/2021   TRIG 164.0 (H) 08/29/2021   HDL 39.20 08/29/2021   LDLDIRECT 127.0 09/29/2020   LDLCALC 134 (H) 08/29/2021   ALT 15 08/29/2021   AST 15 08/29/2021   NA 136 08/29/2021   K 3.7 08/29/2021   CL 105 08/29/2021   CREATININE 0.82 08/29/2021   BUN 9 08/29/2021   CO2 24 08/29/2021   TSH 1.47 08/29/2021    NM Hepato W/EjeCT Fract  Result Date: 01/17/2022 CLINICAL DATA:  Nausea and vomiting. EXAM: NUCLEAR MEDICINE HEPATOBILIARY IMAGING WITH GALLBLADDER EF TECHNIQUE: Sequential images of the abdomen were obtained out to 60 minutes following intravenous administration of radiopharmaceutical. After oral ingestion of Ensure, gallbladder ejection fraction was determined. At 60 min, normal ejection fraction is greater than 33%. RADIOPHARMACEUTICALS:  4.96 mCi Tc-9m Choletec IV COMPARISON:  Right upper quadrant ultrasound March 29, 2021 FINDINGS: Prompt uptake and biliary excretion of activity by the liver is seen. Gallbladder  activity is visualized, consistent with patency of cystic duct. Biliary activity passes into small bowel, consistent with patent common bile duct. Calculated gallbladder ejection fraction is 92%. (Normal gallbladder ejection fraction with Ensure is greater than 33%.) IMPRESSION: 1.  Patent cystic and common bile ducts. 2.  Normal gallbladder ejection fraction. Electronically Signed   By: JDahlia BailiffM.D.   On: 01/17/2022 16:40       Assessment & Plan:   Problem List Items Addressed This Visit   None    CEinar Pheasant MD

## 2022-06-10 NOTE — Assessment & Plan Note (Signed)
Followed by psychiatry 

## 2022-06-10 NOTE — Assessment & Plan Note (Signed)
Persistent issues since covid.  Evaluated at covid clinic.  Evaluated by PT, OT and neuropsych.

## 2022-06-10 NOTE — Assessment & Plan Note (Signed)
Cough and breathing better.  Breztri.  Continue f/u with pulmonary.  

## 2022-06-10 NOTE — Assessment & Plan Note (Signed)
Left mid back pain with pain radiating.  Shooting pain.  Increased tenderness to palpation over the left paraspinous muscle.  Trial of flexeril.  Follow.

## 2022-06-10 NOTE — Assessment & Plan Note (Signed)
Low cholesterol diet and exercise.  Follow lipid panel.   

## 2022-06-10 NOTE — Assessment & Plan Note (Signed)
Seeing neurology.  Just started on verapamil '40mg'$  bid.  Headache this week.  Discussed f/u with neurology.

## 2022-06-10 NOTE — Assessment & Plan Note (Signed)
No acid reflux symptoms reported.

## 2022-06-10 NOTE — Assessment & Plan Note (Signed)
Follow cbc.  

## 2022-06-23 ENCOUNTER — Other Ambulatory Visit: Payer: BC Managed Care – PPO

## 2022-06-29 ENCOUNTER — Encounter: Payer: Self-pay | Admitting: Gastroenterology

## 2022-06-29 ENCOUNTER — Encounter: Payer: Self-pay | Admitting: Internal Medicine

## 2022-06-29 ENCOUNTER — Ambulatory Visit: Payer: BC Managed Care – PPO | Admitting: Gastroenterology

## 2022-06-29 VITALS — BP 148/92 | HR 83 | Ht 66.5 in | Wt 174.1 lb

## 2022-06-29 DIAGNOSIS — K580 Irritable bowel syndrome with diarrhea: Secondary | ICD-10-CM | POA: Diagnosis not present

## 2022-06-29 DIAGNOSIS — K589 Irritable bowel syndrome without diarrhea: Secondary | ICD-10-CM | POA: Insufficient documentation

## 2022-06-29 MED ORDER — RIFAXIMIN 550 MG PO TABS: 550.0000 mg | ORAL_TABLET | Freq: Three times a day (TID) | ORAL | 0 refills | Status: AC

## 2022-06-29 NOTE — Progress Notes (Signed)
Heather Darby, MD [address]  [place], [place] [address]  Main: [phone]  Fax: [phone]    Gastroenterology Consultation  Referring Provider:     Einar Pheasant, MD Primary Care Physician:  Einar Pheasant, MD Primary Gastroenterologist:  Dr. Sherri Sear Reason for Consultation: IBS diarrhea        HPI:   CALISE DUNCKEL is a 46 y.o. female referred by Dr. Einar Pheasant, MD  for consultation & management of chronic constipation, epigastric pain and nausea.  Patient has history of chronic constipation which is severe, has bowel movement about once a week associated with significant straining, describes it as impacted stool followed by excruciating abdominal pain, disimpaction followed by overflow diarrhea.  Patient also reports lower abdominal cramps in addition to these painful episodes associated with severe constipation.  She also has epigastric pain associated with nausea.  She underwent upper endoscopy which was unremarkable except for mild erosive esophagitis.  Apparently, patient is taking over-the-counter omeprazole 20 mg twice daily which helps with reflux.  Ultrasound abdomen did not reveal cholelithiasis.  Colonoscopy did not reveal any space-occupying lesions or inflammation.  It was a fair prep  Follow-up visit 06/29/2022 Patient reports that since last visit, her symptoms are predominantly diarrheal episodes for about a week followed by 2 to 3 days of not having a BM.  She stopped taking Linzess because it was too strong for her, resulting in severe diarrhea and leakage and she has been predominantly having diarrheal episodes rather than severe constipation like before she had COVID.  She thinks her GI symptoms are slowly reverting back to pre-COVID time when she was having diarrheal episodes.  She has abdominal bloating, abdominal cramps.  She denies any particular stress in her life.  She has GI symptoms ever since her high school/college that she  could recollect.  NSAIDs: None  Antiplts/Anticoagulants/Anti thrombotics: None  GI Procedures:  EGD and colonoscopy 06/03/2021 - LA Grade A reflux esophagitis with no bleeding. - White nummular lesions in esophageal mucosa. Brushings performed. - Small hiatal hernia. - A medium amount of food (residue) in the stomach. - Mucosal changes in the duodenum. Biopsied. - Biopsies were obtained in the gastric body, at the incisura and in the gastric antrum.  - Preparation of the colon was poor. - One 4 mm polyp in the rectum, removed with a cold biopsy forceps. Resected and retrieved. - The examination was otherwise normal. - The rectum, sigmoid colon, descending colon, transverse colon, ascending colon and cecum are normal. - Non-bleeding internal hemorrhoids. - Anal papilla(e) were hypertrophied.  DIAGNOSIS:  A. DUODENUM; COLD BIOPSY:  - ENTERIC MUCOSA WITH PRESERVED VILLOUS ARCHITECTURE AND NO SIGNIFICANT  HISTOPATHOLOGIC CHANGE.  - NEGATIVE FOR FEATURES OF CELIAC, DYSPLASIA, AND MALIGNANCY.   B. STOMACH; COLD BIOPSY:  - GASTRIC ANTRAL AND OXYNTIC MUCOSA WITH NO SIGNIFICANT HISTOPATHOLOGIC  CHANGE.  - NEGATIVE FOR H. PYLORI, DYSPLASIA, AND MALIGNANCY.   C. RECTAL POLYP; COLD BIOPSY:  - HYPERPLASTIC POLYP.  - NEGATIVE FOR DYSPLASIA AND MALIGNANCY.   Past Medical History:  Diagnosis Date   Asthma    Depression    bipolar   Dysrhythmia    Fainting spell    H/O   H/O cardiac arrhythmia    Migraines    Seizures (HCC)     Past Surgical History:  Procedure Laterality Date   COLONOSCOPY WITH PROPOFOL N/A 06/03/2021   Procedure: COLONOSCOPY WITH PROPOFOL;  Surgeon: Virgel Manifold, MD;  Location:  [facility];  Service: Endoscopy;  Laterality: N/A;   ESOPHAGOGASTRODUODENOSCOPY N/A 06/03/2021   Procedure: ESOPHAGOGASTRODUODENOSCOPY (EGD);  Surgeon: Virgel Manifold, MD;  Location: [facility];  Service: Endoscopy;  Laterality: N/A;   TUBAL LIGATION Bilateral  09/25/2002   tubal reversal      Current Outpatient Medications:    Acetylcysteine 600 MG TBCR, Take by mouth., Disp: , Rfl:    albuterol (VENTOLIN HFA) 108 (90 Base) MCG/ACT inhaler, INHALE 1 TO 2 PUFFS INTO THE LUNGS EVERY 4 HOURS AS NEEDED FOR WHEEZING OR SHORTNESS OF BREATH, Disp: 18 g, Rfl: 2   benzonatate (TESSALON) 200 MG capsule, Take 1 capsule (200 mg total) by mouth 3 (three) times daily as needed for cough., Disp: 45 capsule, Rfl: 1   Budeson-Glycopyrrol-Formoterol (BREZTRI AEROSPHERE) 160-9-4.8 MCG/ACT AERO, Inhale 2 puffs into the lungs in the morning and at bedtime., Disp: 1 each, Rfl: 5   cetirizine (ZYRTEC) 10 MG tablet, Take 1 tablet (10 mg total) by mouth daily., Disp: 30 tablet, Rfl: 5   cyclobenzaprine (FLEXERIL) 5 MG tablet, Take 1 tablet (5 mg total) by mouth at bedtime as needed for muscle spasms., Disp: 15 tablet, Rfl: 0   diazepam (VALIUM) 10 MG tablet, Take 1 tablet by mouth 4 (four) times daily as needed., Disp: , Rfl:    divalproex (DEPAKOTE) 500 MG DR tablet, Take by mouth., Disp: , Rfl:    fluticasone (FLONASE) 50 MCG/ACT nasal spray, Place 1 spray into both nostrils daily., Disp: 16 g, Rfl: 0   ipratropium-albuterol (DUONEB) 0.5-2.5 (3) MG/3ML SOLN, Inhale 3 mLs into the lungs every 4 (four) hours as needed., Disp: 360 mL, Rfl: 11   magnesium oxide (MAG-OX) 400 MG tablet, Take 1 tablet by mouth daily., Disp: , Rfl:    omeprazole (PRILOSEC) 40 MG capsule, Take 1 capsule (40 mg total) by mouth daily before breakfast., Disp: 30 capsule, Rfl: 3   ondansetron (ZOFRAN-ODT) 4 MG disintegrating tablet, Take 1 tablet (4 mg total) by mouth 2 (two) times daily as needed for nausea or vomiting., Disp: 20 tablet, Rfl: 0   rifaximin (XIFAXAN) 550 MG TABS tablet, Take 1 tablet (550 mg total) by mouth 3 (three) times daily for 14 days., Disp: 42 tablet, Rfl: 0   UBRELVY 100 MG TABS, Take by mouth., Disp: , Rfl:    Ubrogepant 50 MG TABS, Take 1 tablet by mouth as needed., Disp: ,  Rfl:    verapamil (CALAN) 40 MG tablet, Take by mouth., Disp: , Rfl:    zonisamide (ZONEGRAN) 50 MG capsule, Take 50 mg by mouth daily., Disp: , Rfl:     Family History  Problem Relation Age of Onset   Hypertension Mother    Diabetes Mother    Arthritis Mother    Heart disease Mother    Breast cancer Neg Hx      Social History   Tobacco Use   Smoking status: Former    Packs/day: 0.50    Years: 5.00    Total pack years: 2.50    Types: Cigarettes   Smokeless tobacco: Never   Tobacco comments:    Quit mid October 2014  Vaping Use   Vaping Use: Never used  Substance Use Topics   Alcohol use: No    Alcohol/week: 0.0 standard drinks of alcohol   Drug use: No    Allergies as of 06/29/2022 - Review Complete 06/29/2022  Allergen Reaction Noted   Levaquin [levofloxacin] Swelling 04/30/2020   Tramadol Other (See Comments) 02/20/2013  Amoxicillin Nausea And Vomiting 01/20/2019   Augmentin [amoxicillin-pot clavulanate] Other (See Comments) 02/20/2013   Erythromycin Nausea And Vomiting 06/25/2015   Lithium Nausea Only 02/20/2013    Review of Systems:    All systems reviewed and negative except where noted in HPI.   Physical Exam:  BP (!) 148/92 (BP Location: Left Arm, Patient Position: Sitting, Cuff Size: Normal)   Pulse 83   Ht 5' 6.5" (1.689 m)   Wt 174 lb 2 oz (79 kg)   BMI 27.68 kg/m  No LMP recorded.  General:   Alert,  Well-developed, well-nourished, pleasant and cooperative in NAD Head:  Normocephalic and atraumatic. Eyes:  Sclera clear, no icterus.   Conjunctiva pink. Ears:  Normal auditory acuity. Nose:  No deformity, discharge, or lesions. Mouth:  No deformity or lesions,oropharynx pink & moist. Neck:  Supple; no masses or thyromegaly. Lungs:  Respirations even and unlabored.  Clear throughout to auscultation.   No wheezes, crackles, or rhonchi. No acute distress. Heart:  Regular rate and rhythm; no murmurs, clicks, rubs, or gallops. Abdomen:  Normal  bowel sounds. Soft, non-tender and moderately distended tympanic to percussion without masses, hepatosplenomegaly or hernias noted.  No guarding or rebound tenderness.   Rectal: Not performed Msk:  Symmetrical without gross deformities. Good, equal movement & strength bilaterally. Pulses:  Normal pulses noted. Extremities:  No clubbing or edema.  No cyanosis. Neurologic:  Alert and oriented x3;  grossly normal neurologically. Skin:  Intact without significant lesions or rashes. No jaundice. Psych:  Alert and cooperative. Normal mood and affect.  Imaging Studies: Reviewed  Assessment and Plan:   TITILAYO HAGANS is a 46 y.o. female with history of chronic GI symptoms including nonbloody diarrhea associated with abdominal bloating, abdominal cramps.  Patient underwent upper endoscopy and colonoscopy, HIDA scan which were all unremarkable.  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

## 2022-06-30 ENCOUNTER — Encounter: Payer: Self-pay | Admitting: Internal Medicine

## 2022-06-30 DIAGNOSIS — F909 Attention-deficit hyperactivity disorder, unspecified type: Secondary | ICD-10-CM | POA: Insufficient documentation

## 2022-07-02 LAB — FOOD ALLERGY PROFILE
Allergen Corn, IgE: <0.1 kU/L
Clam IgE: <0.1 kU/L
Codfish IgE: <0.1 kU/L
Egg White IgE: <0.1 kU/L
Milk IgE: 0.26 kU/L — AB
Peanut IgE: <0.1 kU/L
Scallop IgE: <0.1 kU/L
Sesame Seed IgE: <0.1 kU/L
Shrimp IgE: <0.1 kU/L
Soybean IgE: <0.1 kU/L
Walnut IgE: <0.1 kU/L
Wheat IgE: <0.1 kU/L

## 2022-07-02 LAB — ALPHA-GAL PANEL
Allergen Lamb IgE: 0.12 kU/L — AB
Beef IgE: 0.13 kU/L — AB
IgE (Immunoglobulin E), Serum: 66 IU/mL (ref 6–495)
O215-IgE Alpha-Gal: <0.1 kU/L
Pork IgE: <0.1 kU/L

## 2022-07-03 ENCOUNTER — Other Ambulatory Visit: Payer: Self-pay | Admitting: Internal Medicine

## 2022-07-03 ENCOUNTER — Telehealth: Payer: Self-pay

## 2022-07-03 DIAGNOSIS — J4541 Moderate persistent asthma with (acute) exacerbation: Secondary | ICD-10-CM

## 2022-07-03 NOTE — Telephone Encounter (Signed)
Medication approved through 07/17/2022

## 2022-07-03 NOTE — Telephone Encounter (Signed)
Submitted PA through cover my meds for the Xifaxan medication. Waiting on response form insurance company

## 2022-07-04 ENCOUNTER — Telehealth: Payer: Self-pay

## 2022-07-04 NOTE — Telephone Encounter (Signed)
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

## 2022-07-04 NOTE — Telephone Encounter (Signed)
Called and left a message for call back  

## 2022-07-05 NOTE — Telephone Encounter (Signed)
Called and left a message for call back  

## 2022-07-06 NOTE — Telephone Encounter (Signed)
Called and left a message for call back.  Sent mychart message with Results

## 2022-07-10 ENCOUNTER — Other Ambulatory Visit (INDEPENDENT_AMBULATORY_CARE_PROVIDER_SITE_OTHER): Payer: BC Managed Care – PPO

## 2022-07-10 DIAGNOSIS — D72829 Elevated white blood cell count, unspecified: Secondary | ICD-10-CM | POA: Diagnosis not present

## 2022-07-10 DIAGNOSIS — E7800 Pure hypercholesterolemia, unspecified: Secondary | ICD-10-CM

## 2022-07-10 LAB — CBC WITH DIFFERENTIAL/PLATELET
Basophils Absolute: 0.1 10*3/uL (ref 0.0–0.1)
Basophils Relative: 0.6 % (ref 0.0–3.0)
Eosinophils Absolute: 0.3 10*3/uL (ref 0.0–0.7)
Eosinophils Relative: 2.1 % (ref 0.0–5.0)
HCT: 37.4 % (ref 36.0–46.0)
Hemoglobin: 12.4 g/dL (ref 12.0–15.0)
Lymphocytes Relative: 22.7 % (ref 12.0–46.0)
Lymphs Abs: 3.5 10*3/uL (ref 0.7–4.0)
MCHC: 33.3 g/dL (ref 30.0–36.0)
MCV: 86.6 fl (ref 78.0–100.0)
Monocytes Absolute: 0.9 10*3/uL (ref 0.1–1.0)
Monocytes Relative: 5.9 % (ref 3.0–12.0)
Neutro Abs: 10.5 10*3/uL — ABNORMAL HIGH (ref 1.4–7.7)
Neutrophils Relative %: 68.7 % (ref 43.0–77.0)
Platelets: 233 10*3/uL (ref 150.0–400.0)
RBC: 4.32 Mil/uL (ref 3.87–5.11)
RDW: 14.7 % (ref 11.5–15.5)
WBC: 15.2 10*3/uL — ABNORMAL HIGH (ref 4.0–10.5)

## 2022-07-10 LAB — HEPATIC FUNCTION PANEL
ALT: 13 U/L (ref 0–35)
AST: 9 U/L (ref 0–37)
Albumin: 3.8 g/dL (ref 3.5–5.2)
Alkaline Phosphatase: 103 U/L (ref 39–117)
Bilirubin, Direct: 0 mg/dL (ref 0.0–0.3)
Total Bilirubin: 0.2 mg/dL (ref 0.2–1.2)
Total Protein: 5.6 g/dL — ABNORMAL LOW (ref 6.0–8.3)

## 2022-07-10 LAB — LIPID PANEL
Cholesterol: 207 mg/dL — ABNORMAL HIGH (ref 0–200)
HDL: 36.6 mg/dL — ABNORMAL LOW (ref >39.00)
NonHDL: 170.83
Total CHOL/HDL Ratio: 6
Triglycerides: 350 mg/dL — ABNORMAL HIGH (ref 0.0–149.0)
VLDL: 70 mg/dL — ABNORMAL HIGH (ref 0.0–40.0)

## 2022-07-10 LAB — BASIC METABOLIC PANEL
BUN: 14 mg/dL (ref 6–23)
CO2: 25 mEq/L (ref 19–32)
Calcium: 8.8 mg/dL (ref 8.4–10.5)
Chloride: 103 mEq/L (ref 96–112)
Creatinine, Ser: 1.04 mg/dL (ref 0.40–1.20)
GFR: 64.16 mL/min (ref >60.00)
Glucose, Bld: 87 mg/dL (ref 70–99)
Potassium: 3.6 mEq/L (ref 3.5–5.1)
Sodium: 138 mEq/L (ref 135–145)

## 2022-07-10 LAB — LDL CHOLESTEROL, DIRECT: Direct LDL: 136 mg/dL

## 2022-07-11 ENCOUNTER — Other Ambulatory Visit: Payer: Self-pay | Admitting: Internal Medicine

## 2022-07-11 ENCOUNTER — Telehealth: Payer: Self-pay

## 2022-07-11 DIAGNOSIS — D72829 Elevated white blood cell count, unspecified: Secondary | ICD-10-CM

## 2022-07-11 NOTE — Telephone Encounter (Signed)
LMTCB for lab results.  

## 2022-07-11 NOTE — Progress Notes (Signed)
Order placed for f/u labs.  

## 2022-07-13 ENCOUNTER — Telehealth: Payer: Self-pay | Admitting: Internal Medicine

## 2022-07-13 DIAGNOSIS — F410 Panic disorder [episodic paroxysmal anxiety] without agoraphobia: Secondary | ICD-10-CM | POA: Diagnosis not present

## 2022-07-13 DIAGNOSIS — F900 Attention-deficit hyperactivity disorder, predominantly inattentive type: Secondary | ICD-10-CM | POA: Diagnosis not present

## 2022-07-13 DIAGNOSIS — F3174 Bipolar disorder, in full remission, most recent episode manic: Secondary | ICD-10-CM | POA: Diagnosis not present

## 2022-07-13 DIAGNOSIS — F3176 Bipolar disorder, in full remission, most recent episode depressed: Secondary | ICD-10-CM | POA: Diagnosis not present

## 2022-07-13 NOTE — Telephone Encounter (Signed)
Appointment For: Heather [name] ([id number])  Visit Type: OFFICE VISIT (1004)    07/19/2022   7:00 AM  30 mins.  Einar Pheasant, MD       [facility]    Patient Comments:  LHC, fatigue legs aching and at times walking feels unstable   Appointment Scheduled (Newest Message First) View All Conversations on this Encounter Mychart, Generic  Heather [name] 15 hours ago (6:44 PM)   GM Appointment Information:     Visit Type: Office Visit         Date: 07/19/2022                 Dept: [facility] [place]                 Provider: Einar Pheasant                 Time: 7:00 AM                 Length: 30 min   Appt Status: Scheduled

## 2022-07-13 NOTE — Telephone Encounter (Signed)
LMTCB

## 2022-07-14 ENCOUNTER — Other Ambulatory Visit: Payer: Self-pay

## 2022-07-14 ENCOUNTER — Ambulatory Visit: Payer: BC Managed Care – PPO | Admitting: Pulmonary Disease

## 2022-07-14 DIAGNOSIS — D72829 Elevated white blood cell count, unspecified: Secondary | ICD-10-CM

## 2022-07-14 NOTE — Telephone Encounter (Signed)
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 She feels that her legs have become unsteady & she has had severe fatigue since Covid. She fee s that the leg unsteadiness has worsened. She said that she made appointment to address again & get to talk with you about it. Scheduled 10/25.

## 2022-07-19 ENCOUNTER — Ambulatory Visit (INDEPENDENT_AMBULATORY_CARE_PROVIDER_SITE_OTHER): Payer: BC Managed Care – PPO | Admitting: Internal Medicine

## 2022-07-19 VITALS — BP 120/96 | Ht 66.5 in | Wt 176.8 lb

## 2022-07-19 DIAGNOSIS — E7800 Pure hypercholesterolemia, unspecified: Secondary | ICD-10-CM

## 2022-07-19 DIAGNOSIS — J4541 Moderate persistent asthma with (acute) exacerbation: Secondary | ICD-10-CM | POA: Diagnosis not present

## 2022-07-19 DIAGNOSIS — R5383 Other fatigue: Secondary | ICD-10-CM

## 2022-07-19 DIAGNOSIS — M79606 Pain in leg, unspecified: Secondary | ICD-10-CM | POA: Insufficient documentation

## 2022-07-19 DIAGNOSIS — M79605 Pain in left leg: Secondary | ICD-10-CM

## 2022-07-19 DIAGNOSIS — K5900 Constipation, unspecified: Secondary | ICD-10-CM

## 2022-07-19 DIAGNOSIS — M79604 Pain in right leg: Secondary | ICD-10-CM

## 2022-07-19 DIAGNOSIS — K589 Irritable bowel syndrome without diarrhea: Secondary | ICD-10-CM

## 2022-07-19 DIAGNOSIS — G40909 Epilepsy, unspecified, not intractable, without status epilepticus: Secondary | ICD-10-CM

## 2022-07-19 DIAGNOSIS — F419 Anxiety disorder, unspecified: Secondary | ICD-10-CM

## 2022-07-19 DIAGNOSIS — Z8616 Personal history of COVID-19: Secondary | ICD-10-CM

## 2022-07-19 DIAGNOSIS — D72829 Elevated white blood cell count, unspecified: Secondary | ICD-10-CM

## 2022-07-19 DIAGNOSIS — G43809 Other migraine, not intractable, without status migrainosus: Secondary | ICD-10-CM

## 2022-07-19 LAB — CBC WITH DIFFERENTIAL/PLATELET
Basophils Absolute: 0.1 10*3/uL (ref 0.0–0.1)
Basophils Relative: 0.8 % (ref 0.0–3.0)
Eosinophils Absolute: 0.4 10*3/uL (ref 0.0–0.7)
Eosinophils Relative: 2.3 % (ref 0.0–5.0)
HCT: 41.6 % (ref 36.0–46.0)
Hemoglobin: 13.8 g/dL (ref 12.0–15.0)
Lymphocytes Relative: 18.3 % (ref 12.0–46.0)
Lymphs Abs: 3.1 10*3/uL (ref 0.7–4.0)
MCHC: 33.1 g/dL (ref 30.0–36.0)
MCV: 86.1 fl (ref 78.0–100.0)
Monocytes Absolute: 1 10*3/uL (ref 0.1–1.0)
Monocytes Relative: 6 % (ref 3.0–12.0)
Neutro Abs: 12.2 10*3/uL — ABNORMAL HIGH (ref 1.4–7.7)
Neutrophils Relative %: 72.6 % (ref 43.0–77.0)
Platelets: 296 10*3/uL (ref 150.0–400.0)
RBC: 4.83 Mil/uL (ref 3.87–5.11)
RDW: 15.1 % (ref 11.5–15.5)
WBC: 16.8 10*3/uL — ABNORMAL HIGH (ref 4.0–10.5)

## 2022-07-19 LAB — SEDIMENTATION RATE: Sed Rate: 25 mm/hr — ABNORMAL HIGH (ref 0–20)

## 2022-07-19 LAB — BASIC METABOLIC PANEL
BUN: 12 mg/dL (ref 6–23)
CO2: 25 mEq/L (ref 19–32)
Calcium: 9.4 mg/dL (ref 8.4–10.5)
Chloride: 102 mEq/L (ref 96–112)
Creatinine, Ser: 0.69 mg/dL (ref 0.40–1.20)
GFR: 103.53 mL/min (ref >60.00)
Glucose, Bld: 92 mg/dL (ref 70–99)
Potassium: 3.6 mEq/L (ref 3.5–5.1)
Sodium: 136 mEq/L (ref 135–145)

## 2022-07-19 LAB — CK: Total CK: 51 U/L (ref 7–177)

## 2022-07-19 NOTE — Progress Notes (Signed)
Patient ID: Heather [name], female   DOB: 1975-11-23, 46 y.o.   MRN: [id number]   Subjective:    Patient ID: Heather [name], female    DOB: [birth year], 46 y.o.   MRN: [id number]   Patient here for  Chief Complaint  Patient presents with   Extremity Weakness    Bilateral leg weakness & aching-no falls or injury. H/O Long Haul COVID sx's; +fatigue   .   HPI Work in appt - increased fatigue.  Has had issues with increased fatigue since covid.  Reports now - legs hurt - pain and weakness.  Discussed further w/up and evaluation.  Recently evaluated - persistent cough.  Saw pulmonary 05/19/22 - zpak and steroid taper.  On breztri.  Cough/breathing is better.  Seeing neurology for headaches (trial of ubrelvy).  Started on verapamil.  Saw Dr Marius Ditch 06/29/22 - diarrhea - stopped linzess. Is s/p upper endoscopy and colonoscopy, HIDA scan which were all unremarkable.  Her symptoms were felt to be consistent with diarrhea predominant irritable bowel syndrome.  Recommended trial of xifaxan. Has allergy testing - allergic to mold and beef/lamb.   Past Medical History:  Diagnosis Date   Asthma    Depression    bipolar   Dysrhythmia    Fainting spell    H/O   H/O cardiac arrhythmia    Migraines    Seizures (HCC)    Past Surgical History:  Procedure Laterality Date   COLONOSCOPY WITH PROPOFOL N/A 06/03/2021   Procedure: COLONOSCOPY WITH PROPOFOL;  Surgeon: Virgel Manifold, MD;  Location: [facility] ENDOSCOPY;  Service: Endoscopy;  Laterality: N/A;   ESOPHAGOGASTRODUODENOSCOPY N/A 06/03/2021   Procedure: ESOPHAGOGASTRODUODENOSCOPY (EGD);  Surgeon: Virgel Manifold, MD;  Location: [facility] ENDOSCOPY;  Service: Endoscopy;  Laterality: N/A;   TUBAL LIGATION Bilateral 09/25/2002   tubal reversal     Family History  Problem Relation Age of Onset   Hypertension Mother    Diabetes Mother    Arthritis Mother    Heart disease Mother    Breast cancer Neg Hx    Social History   Socioeconomic History    Marital status: Married    Spouse name: Not on file   Number of children: Not on file   Years of education: Not on file   Highest education level: Not on file  Occupational History   Not on file  Tobacco Use   Smoking status: Former    Packs/day: 0.50    Years: 5.00    Total pack years: 2.50    Types: Cigarettes   Smokeless tobacco: Never   Tobacco comments:    Quit mid October 2014  Vaping Use   Vaping Use: Never used  Substance and Sexual Activity   Alcohol use: No    Alcohol/week: 0.0 standard drinks of alcohol   Drug use: No   Sexual activity: Yes    Birth control/protection: None  Other Topics Concern   Not on file  Social History Narrative   Not on file   Social Determinants of Health   Financial Resource Strain: Not on file  Food Insecurity: Not on file  Transportation Needs: Not on file  Physical Activity: Not on file  Stress: Not on file  Social Connections: Not on file     Review of Systems  Constitutional:  Positive for fatigue. Negative for appetite change and unexpected weight change.  HENT:  Negative for congestion and sinus pressure.   Respiratory:  Negative for chest tightness.  Cough and breathing - better.   Cardiovascular:  Negative for chest pain, palpitations and leg swelling.  Gastrointestinal:  Negative for abdominal pain, diarrhea, nausea and vomiting.  Genitourinary:  Negative for difficulty urinating and dysuria.  Musculoskeletal:        Leg pain and weakness as outlined.   Skin:  Negative for color change and rash.  Neurological:        Seeing neurology for headache.  No increased dizziness.   Psychiatric/Behavioral:  Negative for agitation.        Increased stress related to current medical issues.         Objective:     Ht 5' 6.5" (1.689 m)   BMI 27.68 kg/m  Wt Readings from Last 3 Encounters:  06/29/22 174 lb 2 oz (79 kg)  06/09/22 174 lb 3.2 oz (79 kg)  05/19/22 177 lb (80.3 kg)    Physical Exam Vitals  reviewed.  Constitutional:      General: She is not in acute distress.    Appearance: Normal appearance.  HENT:     Head: Normocephalic and atraumatic.     Right Ear: External ear normal.     Left Ear: External ear normal.  Eyes:     General: No scleral icterus.       Right eye: No discharge.        Left eye: No discharge.     Conjunctiva/sclera: Conjunctivae normal.  Neck:     Thyroid: No thyromegaly.  Cardiovascular:     Rate and Rhythm: Normal rate and regular rhythm.  Pulmonary:     Effort: No respiratory distress.     Breath sounds: Normal breath sounds. No wheezing.  Abdominal:     General: Bowel sounds are normal.     Palpations: Abdomen is soft.     Tenderness: There is no abdominal tenderness.  Musculoskeletal:        General: No swelling.     Cervical back: Neck supple. No tenderness.     Comments: Motor strength appears to be equal bilateral lower extremities.  Dorsiflexion/plantar flexion - normal.   Lymphadenopathy:     Cervical: No cervical adenopathy.  Skin:    Findings: No erythema or rash.  Neurological:     Mental Status: [name].  Psychiatric:        Mood and Affect: Mood normal.        Behavior: Behavior normal.      Outpatient Encounter Medications as of 07/19/2022  Medication Sig   Acetylcysteine 600 MG TBCR Take by mouth.   benzonatate (TESSALON) 200 MG capsule Take 1 capsule (200 mg total) by mouth 3 (three) times daily as needed for cough.   Budeson-Glycopyrrol-Formoterol (BREZTRI AEROSPHERE) 160-9-4.8 MCG/ACT AERO Inhale 2 puffs into the lungs in the morning and at bedtime.   cetirizine (ZYRTEC) 10 MG tablet Take 1 tablet (10 mg total) by mouth daily.   diazepam (VALIUM) 10 MG tablet Take 1 tablet by mouth 4 (four) times daily as needed.   divalproex (DEPAKOTE) 500 MG DR tablet Take by mouth.   fluticasone (FLONASE) 50 MCG/ACT nasal spray Place 1 spray into both nostrils daily.   ipratropium-albuterol (DUONEB) 0.5-2.5 (3) MG/3ML SOLN  Inhale 3 mLs into the lungs every 4 (four) hours as needed.   UBRELVY 100 MG TABS Take by mouth.   verapamil (CALAN) 40 MG tablet Take by mouth.   zonisamide (ZONEGRAN) 50 MG capsule Take 50 mg by mouth daily.   [DISCONTINUED] albuterol (VENTOLIN  HFA) 108 (90 Base) MCG/ACT inhaler INHALE 1 TO 2 PUFFS BY MOUTH EVERY 4 HOURS AS NEEDED FOR WHEEZING OR SHORTNESS OF BREATH   [DISCONTINUED] cyclobenzaprine (FLEXERIL) 5 MG tablet Take 1 tablet (5 mg total) by mouth at bedtime as needed for muscle spasms.   [DISCONTINUED] magnesium oxide (MAG-OX) 400 MG tablet Take 1 tablet by mouth daily.   [DISCONTINUED] ondansetron (ZOFRAN-ODT) 4 MG disintegrating tablet Take 1 tablet (4 mg total) by mouth 2 (two) times daily as needed for nausea or vomiting.   [DISCONTINUED] Ubrogepant 50 MG TABS Take 1 tablet by mouth as needed.   omeprazole (PRILOSEC) 40 MG capsule Take 1 capsule (40 mg total) by mouth daily before breakfast.   No facility-administered encounter medications on file as of 07/19/2022.     Lab Results  Component Value Date   WBC 16.8 (H) 07/19/2022   HGB 13.8 07/19/2022   HCT 41.6 07/19/2022   PLT 296.0 07/19/2022   GLUCOSE 92 07/19/2022   CHOL 207 (H) 07/10/2022   TRIG 350.0 (H) 07/10/2022   HDL 36.60 (L) 07/10/2022   LDLDIRECT 136.0 07/10/2022   LDLCALC 134 (H) 08/29/2021   ALT 13 07/10/2022   AST 9 07/10/2022   NA 136 07/19/2022   K 3.6 07/19/2022   CL 102 07/19/2022   CREATININE 0.69 07/19/2022   BUN 12 07/19/2022   CO2 25 07/19/2022   TSH 1.47 08/29/2021    NM Hepato W/EjeCT Fract  Result Date: 01/17/2022 CLINICAL DATA:  Nausea and vomiting. EXAM: NUCLEAR MEDICINE HEPATOBILIARY IMAGING WITH GALLBLADDER EF TECHNIQUE: Sequential images of the abdomen were obtained out to 60 minutes following intravenous administration of radiopharmaceutical. After oral ingestion of Ensure, gallbladder ejection fraction was determined. At 60 min, normal ejection fraction is greater than 33%.  RADIOPHARMACEUTICALS:  4.96 mCi Tc-4m Choletec IV COMPARISON:  Right upper quadrant ultrasound March 29, 2021 FINDINGS: Prompt uptake and biliary excretion of activity by the liver is seen. Gallbladder activity is visualized, consistent with patency of cystic duct. Biliary activity passes into small bowel, consistent with patent common bile duct. Calculated gallbladder ejection fraction is 92%. (Normal gallbladder ejection fraction with Ensure is greater than 33%.) IMPRESSION: 1.  Patent cystic and common bile ducts. 2.  Normal gallbladder ejection fraction. Electronically Signed   By: JDahlia BailiffM.D.   On: 01/17/2022 16:40       Assessment & Plan:   Problem List Items Addressed This Visit     Anxiety - Primary    Followed by psychiatry.       Asthma    Cough and breathing better.  Breztri.  Continue f/u with pulmonary.       Constipation    Off linzess now.  Seeing Dr VMarius Ditch  Recommended trial of xifaxan.       Fatigue    Persistent issues since covid.  Evaluated at covid clinic.  Evaluated by PT, OT and neuropsych.  Has started back exercising.        History of COVID-19    Diagnosed 05/2020.  Received monoclonal antibody infusion.  Still with increased fatigue.  Increased anxiety related.  Brain fog. Evaluated [facility] covid clinic as outlined.  Persistent symptoms - fatigue.  Now describing leg weakness and pain.  Refer back to neurology for evaluation.        Hypercholesterolemia    Low cholesterol diet and exercise.  Follow lipid panel.       Irritable bowel syndrome (IBS)    IBS - diarrhea -  Dr Marius Ditch (06/2022) - Trial of Xifaxan       Leg pain    Leg pain and weakness as outlined.  Refer back to neurology for evaluation.  Discussed.  Check esr.       Relevant Orders   Sedimentation rate (Completed)   CK (Creatine Kinase) (Completed)   Ambulatory referral to Neurology   Leukocytosis    Recheck cbc.  Persistent elevated white blood cell count.  Off prednisone. No  active infection.  Consider hematology evaluation if persistent.        Migraine    Seeing neurology.  Recently added verapamil.         Seizure disorder [facility])    Has been followed by neurology.  Stable.         Einar Pheasant, MD

## 2022-07-20 ENCOUNTER — Other Ambulatory Visit: Payer: Self-pay | Admitting: Internal Medicine

## 2022-07-20 DIAGNOSIS — J4541 Moderate persistent asthma with (acute) exacerbation: Secondary | ICD-10-CM

## 2022-07-21 MED ORDER — MAGNESIUM OXIDE 400 MG PO TABS: 1.0000 | ORAL_TABLET | Freq: Every day | ORAL | 2 refills | Status: DC

## 2022-07-21 MED ORDER — ALBUTEROL SULFATE HFA 108 (90 BASE) MCG/ACT IN AERS: INHALATION_SPRAY | RESPIRATORY_TRACT | 0 refills | Status: DC

## 2022-07-21 MED ORDER — ONDANSETRON 4 MG PO TBDP: 4.0000 mg | ORAL_TABLET | Freq: Two times a day (BID) | ORAL | 0 refills | Status: DC | PRN

## 2022-07-24 ENCOUNTER — Telehealth: Payer: Self-pay | Admitting: Internal Medicine

## 2022-07-24 NOTE — Telephone Encounter (Signed)
Patient returning your call.

## 2022-07-25 ENCOUNTER — Telehealth: Payer: Self-pay

## 2022-07-25 DIAGNOSIS — D72829 Elevated white blood cell count, unspecified: Secondary | ICD-10-CM

## 2022-07-25 NOTE — Telephone Encounter (Signed)
Mychart msg sent per pt request

## 2022-07-25 NOTE — Telephone Encounter (Signed)
Patient states she is returning a call from Denita Lung, [place].  Luna Fuse was not available at the time of the call.  Patient states she works third shift and is going to go to sleep.  Patient states she would like for Luna Fuse to reach out to her via MyChart and they can communicate that way.

## 2022-07-25 NOTE — Telephone Encounter (Signed)
Mychart message sent to pt w/ results and recommendations per pt request

## 2022-07-26 NOTE — Telephone Encounter (Signed)
Will hold on abx given no obvious infection.  I have placed the order for the hematology referral.  Someone should be contacting her with an appt.  Also have her continue to spot check her pressure.  Can send in more readings.  Will view trend.  Also, since covid she has had issues with intermittent nausea.  Confirm no acute symptoms.

## 2022-07-26 NOTE — Telephone Encounter (Signed)
Order placed for hematology referral.  

## 2022-07-26 NOTE — Addendum Note (Signed)
Addended by: Alisa Graff on: 07/26/2022 02:30 PM   Modules accepted: Orders

## 2022-07-27 DIAGNOSIS — R569 Unspecified convulsions: Secondary | ICD-10-CM | POA: Diagnosis not present

## 2022-07-27 NOTE — Telephone Encounter (Signed)
If any new symptoms or problems needs to be evaluated.  Have her continue to monitor blood pressure and send in readings.

## 2022-07-30 ENCOUNTER — Encounter: Payer: Self-pay | Admitting: Internal Medicine

## 2022-07-30 NOTE — Assessment & Plan Note (Signed)
Low cholesterol diet and exercise.  Follow lipid panel.   

## 2022-07-30 NOTE — Assessment & Plan Note (Signed)
Leg pain and weakness as outlined.  Refer back to neurology for evaluation.  Discussed.  Check esr.

## 2022-07-30 NOTE — Assessment & Plan Note (Signed)
Diagnosed 05/2020.  Received monoclonal antibody infusion.  Still with increased fatigue.  Increased anxiety related.  Brain fog. Evaluated [facility] covid clinic as outlined.  Persistent symptoms - fatigue.  Now describing leg weakness and pain.  Refer back to neurology for evaluation.

## 2022-07-30 NOTE — Assessment & Plan Note (Addendum)
Off linzess now.  Seeing Dr Marius Ditch.  Recommended trial of xifaxan.

## 2022-07-30 NOTE — Assessment & Plan Note (Signed)
IBS - diarrhea - Dr Marius Ditch (06/2022) - Trial of Xifaxan

## 2022-07-30 NOTE — Assessment & Plan Note (Signed)
Has been followed by neurology.  Stable.

## 2022-07-30 NOTE — Assessment & Plan Note (Signed)
Recheck cbc.  Persistent elevated white blood cell count.  Off prednisone. No active infection.  Consider hematology evaluation if persistent.

## 2022-07-30 NOTE — Assessment & Plan Note (Signed)
Followed by psychiatry 

## 2022-07-30 NOTE — Assessment & Plan Note (Signed)
Seeing neurology.  Recently added verapamil.

## 2022-07-30 NOTE — Assessment & Plan Note (Signed)
Persistent issues since covid.  Evaluated at covid clinic.  Evaluated by PT, OT and neuropsych.  Has started back exercising.

## 2022-07-30 NOTE — Assessment & Plan Note (Signed)
Cough and breathing better.  Breztri.  Continue f/u with pulmonary.

## 2022-08-03 ENCOUNTER — Telehealth: Payer: Self-pay

## 2022-08-03 NOTE — Telephone Encounter (Signed)
Called pt to inform her of appt tomorrow and introduce our services. No answer, left VM with appt details.

## 2022-08-04 ENCOUNTER — Encounter: Payer: Self-pay | Admitting: Oncology

## 2022-08-04 ENCOUNTER — Inpatient Hospital Stay: Payer: BC Managed Care – PPO | Attending: Oncology | Admitting: Oncology

## 2022-08-04 ENCOUNTER — Inpatient Hospital Stay: Payer: BC Managed Care – PPO

## 2022-08-04 VITALS — BP 146/94 | HR 80 | Temp 97.6°F | Resp 18 | Ht 66.5 in | Wt 174.2 lb

## 2022-08-04 DIAGNOSIS — Z79899 Other long term (current) drug therapy: Secondary | ICD-10-CM | POA: Diagnosis not present

## 2022-08-04 DIAGNOSIS — D72828 Other elevated white blood cell count: Secondary | ICD-10-CM | POA: Diagnosis not present

## 2022-08-04 DIAGNOSIS — Z87891 Personal history of nicotine dependence: Secondary | ICD-10-CM | POA: Insufficient documentation

## 2022-08-04 DIAGNOSIS — G40909 Epilepsy, unspecified, not intractable, without status epilepticus: Secondary | ICD-10-CM | POA: Diagnosis not present

## 2022-08-04 DIAGNOSIS — J45909 Unspecified asthma, uncomplicated: Secondary | ICD-10-CM | POA: Diagnosis not present

## 2022-08-04 LAB — CBC WITH DIFFERENTIAL/PLATELET
Abs Immature Granulocytes: 0.06 10*3/uL (ref 0.00–0.07)
Basophils Absolute: 0.1 10*3/uL (ref 0.0–0.1)
Basophils Relative: 1 %
Eosinophils Absolute: 0.4 10*3/uL (ref 0.0–0.5)
Eosinophils Relative: 3 %
HCT: 40.4 % (ref 36.0–46.0)
Hemoglobin: 13.6 g/dL (ref 12.0–15.0)
Immature Granulocytes: 0 %
Lymphocytes Relative: 22 %
Lymphs Abs: 3.3 10*3/uL (ref 0.7–4.0)
MCH: 28.8 pg (ref 26.0–34.0)
MCHC: 33.7 g/dL (ref 30.0–36.0)
MCV: 85.6 fL (ref 80.0–100.0)
Monocytes Absolute: 0.9 10*3/uL (ref 0.1–1.0)
Monocytes Relative: 6 %
Neutro Abs: 10.2 10*3/uL — ABNORMAL HIGH (ref 1.7–7.7)
Neutrophils Relative %: 68 %
Platelets: 301 10*3/uL (ref 150–400)
RBC: 4.72 MIL/uL (ref 3.87–5.11)
RDW: 13.3 % (ref 11.5–15.5)
WBC: 15 10*3/uL — ABNORMAL HIGH (ref 4.0–10.5)
nRBC: 0 % (ref 0.0–0.2)

## 2022-08-04 LAB — C-REACTIVE PROTEIN: CRP: 1 mg/dL — ABNORMAL HIGH (ref <1.0)

## 2022-08-04 LAB — LACTATE DEHYDROGENASE: LDH: 127 U/L (ref 98–192)

## 2022-08-04 LAB — SEDIMENTATION RATE: Sed Rate: 10 mm/hr (ref 0–20)

## 2022-08-04 LAB — HEPATITIS PANEL, ACUTE
HCV Ab: NONREACTIVE
Hep A IgM: NONREACTIVE
Hep B C IgM: NONREACTIVE
Hepatitis B Surface Ag: NONREACTIVE

## 2022-08-04 LAB — HIV ANTIBODY (ROUTINE TESTING W REFLEX): HIV Screen 4th Generation wRfx: NONREACTIVE

## 2022-08-04 NOTE — Progress Notes (Signed)
Hematology/Oncology Consult note [phone] [phone]     Patient Care Team: Einar Pheasant, MD as PCP - General (Internal Medicine)  ASSESSMENT & PLAN  Leukocytosis Labs reviewed and discussed with patient that Leukocytosis, predominantly neutrophilia, can be secondary to infection, chronic inflammation,  autoimmune disease, or underlying bone marrow disorders.  Patient's clinical history of chronic cough, multiple episodes of asthma flare, indicate that the nature of her leukocytosis likely reactive.  Rule out other etiologies.  For the work up of patient's leukocytosis, I recommend checking CBC;CMP, LDH, smear review, peripheral flowcytometry, myeloma labs, ANA, CRP, PSA, JAK2 mutation with reflex   Orders Placed This Encounter  Procedures   CBC with Differential/Platelet    Standing Status:   Future    Number of Occurrences:   1    Standing Expiration Date:   08/05/2023   Multiple Myeloma Panel (SPEP&IFE w/QIG)    Standing Status:   Future    Number of Occurrences:   1    Standing Expiration Date:   08/05/2023   Kappa/lambda light chains    Standing Status:   Future    Number of Occurrences:   1    Standing Expiration Date:   08/05/2023   Flow cytometry panel-leukemia/lymphoma work-up    Standing Status:   Future    Number of Occurrences:   1    Standing Expiration Date:   08/05/2023   ANA, IFA (with reflex)    Standing Status:   Future    Number of Occurrences:   1    Standing Expiration Date:   08/05/2023   HIV Antibody (routine testing w rflx)    Standing Status:   Future    Number of Occurrences:   1    Standing Expiration Date:   08/05/2023   Hepatitis panel, acute    Standing Status:   Future    Number of Occurrences:   1    Standing Expiration Date:   08/05/2023   JAK2 V617F rfx CALR/MPL/E12-15    Standing Status:   Future    Number of Occurrences:   1    Standing Expiration Date:   08/05/2023   Lactate dehydrogenase    Standing  Status:   Future    Number of Occurrences:   1    Standing Expiration Date:   08/05/2023   C-reactive protein    Standing Status:   Future    Number of Occurrences:   1    Standing Expiration Date:   08/05/2023   Sedimentation rate    Standing Status:   Future    Number of Occurrences:   1    Standing Expiration Date:   08/04/2023   Follow-up 3 to 4 weeks to go over results. All questions were answered. The patient knows to call the clinic with any problems, questions or concerns.   Earlie Server, MD, PhD [facility] Hematology Oncology 08/04/2022       CHIEF COMPLAINTS/PURPOSE OF CONSULTATION:  Leukocytosis/elevate white count  HISTORY OF PRESENTING ILLNESS:  Heather [name] 46 y.o. female is here because of elevated WBC.  Patient has chronic leukocytosis predominantly neutrophilia.  Dated back to 2016. Patient reports history of IBS, asthma, chronic cough, known COVID symptoms. She has asthma flare multiple times per year, this year she has had steroid treatments 4-5 times.  Currently not on steroids.  T patient reports that each time with asthma flare, her white count goes up to 18-19, and if she takes antibiotics, leukocytosis  improves.  Patient uses steroid inhaler 2-3 times per day. Patient has a seizure disorder, she is on Depakote, There is not reported symptoms of urinary frequency/urgency or dysuria, diarrhea, or abnormal skin rash.  Endorses chronic hip pain, lower extremity pain.  No hand joints deformity or pain. Denies any unintentional weight loss, night sweats or fever.  She denies smoking currently. She had no prior history or diagnosis of cancer.  Her age appropriate screening programs are up-to-date. The patient has no prior diagnosis of autoimmune disease and was not prescribed corticosteroids related products.   MEDICAL HISTORY:  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 hauler    Depression    bipolar   Dysrhythmia    Fainting spell     H/O   H/O cardiac arrhythmia    Migraines    Seizures ([place])     SURGICAL HISTORY: Past Surgical History:  Procedure Laterality Date   COLONOSCOPY WITH PROPOFOL N/A 06/03/2021   Procedure: COLONOSCOPY WITH PROPOFOL;  Surgeon: Virgel Manifold, MD;  Location: [facility] ENDOSCOPY;  Service: Endoscopy;  Laterality: N/A;   ESOPHAGOGASTRODUODENOSCOPY N/A 06/03/2021   Procedure: ESOPHAGOGASTRODUODENOSCOPY (EGD);  Surgeon: Virgel Manifold, MD;  Location: [facility] ENDOSCOPY;  Service: Endoscopy;  Laterality: N/A;   TUBAL LIGATION Bilateral 09/25/2002   tubal reversal      SOCIAL HISTORY: Social History   Socioeconomic History   Marital status: Married    Spouse name: Not on file   Number of children: Not on file   Years of education: Not on file   Highest education level: Not on file  Occupational History   Not on file  Tobacco Use   Smoking status: Former    Packs/day: 0.50    Years: 5.00    Total pack years: 2.50    Types: Cigarettes    Quit date: 2013    Years since quitting: 10.8   Smokeless tobacco: Never   Tobacco comments:    Quit mid October 2014  Vaping Use   Vaping Use: Never used  Substance and Sexual Activity   Alcohol use: No    Alcohol/week: 0.0 standard drinks of alcohol   Drug use: No   Sexual activity: Yes    Birth control/protection: None  Other Topics Concern   Not on file  Social History Narrative   Not on file   Social Determinants of Health   Financial Resource Strain: Not on file  Food Insecurity: Not on file  Transportation Needs: Not on file  Physical Activity: Not on file  Stress: Not on file  Social Connections: Not on file  Intimate Partner Violence: Not on file    FAMILY HISTORY: Family History  Problem Relation Age of Onset   Hypertension Mother    Diabetes Mother    Arthritis Mother    Heart disease Mother    Breast cancer Neg Hx     ALLERGIES:  is allergic to levaquin [levofloxacin], tramadol, amoxicillin, augmentin  [amoxicillin-pot clavulanate], erythromycin, and lithium.  MEDICATIONS:  Current Outpatient Medications  Medication Sig Dispense Refill   Acetylcysteine 600 MG TBCR Take by mouth.     albuterol (VENTOLIN HFA) 108 (90 Base) MCG/ACT inhaler INHALE 1 TO 2 PUFFS BY MOUTH EVERY 4 HOURS AS NEEDED FOR WHEEZING OR SHORTNESS OF BREATH 18 g 0   benzonatate (TESSALON) 200 MG capsule Take 1 capsule (200 mg total) by mouth 3 (three) times daily as needed for cough. 45 capsule 1   Budeson-Glycopyrrol-Formoterol (  BREZTRI AEROSPHERE) 160-9-4.8 MCG/ACT AERO Inhale 2 puffs into the lungs in the morning and at bedtime. 1 each 5   cetirizine (ZYRTEC) 10 MG tablet Take 1 tablet (10 mg total) by mouth daily. 30 tablet 5   diazepam (VALIUM) 10 MG tablet Take 1 tablet by mouth 4 (four) times daily as needed.     divalproex (DEPAKOTE) 500 MG DR tablet Take by mouth.     fluticasone (FLONASE) 50 MCG/ACT nasal spray Place 1 spray into both nostrils daily. 16 g 0   ipratropium-albuterol (DUONEB) 0.5-2.5 (3) MG/3ML SOLN Inhale 3 mLs into the lungs every 4 (four) hours as needed. 360 mL 11   magnesium oxide (MAG-OX) 400 MG tablet Take 1 tablet (400 mg total) by mouth daily. 30 tablet 2   omeprazole (PRILOSEC) 40 MG capsule Take 1 capsule (40 mg total) by mouth daily before breakfast. 30 capsule 3   ondansetron (ZOFRAN-ODT) 4 MG disintegrating tablet Take 1 tablet (4 mg total) by mouth 2 (two) times daily as needed for nausea or vomiting. 20 tablet 0   UBRELVY 100 MG TABS Take by mouth.     verapamil (CALAN) 40 MG tablet Take by mouth.     zonisamide (ZONEGRAN) 50 MG capsule Take 50 mg by mouth daily.     No current facility-administered medications for this visit.    Review of Systems  Constitutional:  Negative for appetite change, chills, fatigue and fever.  HENT:   Negative for hearing loss and voice change.   Eyes:  Negative for eye problems.  Respiratory:  Positive for cough. Negative for chest tightness.    Cardiovascular:  Negative for chest pain.  Gastrointestinal:  Negative for abdominal distention, abdominal pain and blood in stool.  Endocrine: Negative for hot flashes.  Genitourinary:  Negative for difficulty urinating and frequency.   Musculoskeletal:  Positive for arthralgias.  Skin:  Negative for itching and rash.  Neurological:  Negative for extremity weakness.  Hematological:  Negative for adenopathy.  Psychiatric/Behavioral:  Negative for confusion.      PHYSICAL EXAMINATION: ECOG PERFORMANCE STATUS: 0 - Asymptomatic  Vitals:   08/04/22 1059  BP: (!) 146/94  Pulse: 80  Resp: 18  Temp: 97.6 F (36.4 C)   Filed Weights   08/04/22 1059  Weight: 174 lb 3.2 oz (79 kg)    Physical Exam Constitutional:      General: She is not in acute distress.    Appearance: She is not diaphoretic.  HENT:     Head: Normocephalic and atraumatic.     Nose: Nose normal.     Mouth/Throat:     Pharynx: No oropharyngeal exudate.  Eyes:     General: No scleral icterus.    Pupils: Pupils are equal, round, and reactive to light.  Cardiovascular:     Rate and Rhythm: Normal rate and regular rhythm.     Heart sounds: No murmur heard. Pulmonary:     Effort: Pulmonary effort is normal. No respiratory distress.     Breath sounds: No rales.  Chest:     Chest wall: No tenderness.  Abdominal:     General: There is no distension.     Palpations: Abdomen is soft.     Tenderness: There is no abdominal tenderness.  Musculoskeletal:        General: Normal range of motion.     Cervical back: Normal range of motion and neck supple.  Skin:    General: Skin is warm and dry.  Findings: No erythema.  Neurological:     Mental Status: [name] and oriented to person, place, and time.     Cranial Nerves: No cranial nerve deficit.     Motor: No abnormal muscle tone.     Coordination: Coordination normal.  Psychiatric:        Mood and Affect: Affect normal.     LABORATORY DATA:  I have  reviewed the data as listed    Latest Ref Rng & Units 08/04/2022   11:45 AM 07/19/2022    7:48 AM 07/10/2022    7:44 AM  CBC  WBC 4.0 - 10.5 K/uL 15.0  16.8  15.2   Hemoglobin 12.0 - 15.0 g/dL 13.6  13.8  12.4   Hematocrit 36.0 - 46.0 % 40.4  41.6  37.4   Platelets 150 - 400 K/uL 301  296.0  233.0       Latest Ref Rng & Units 07/19/2022    7:48 AM 07/10/2022    7:44 AM 08/29/2021    8:12 AM  CMP  Glucose 70 - 99 mg/dL 92  87  78   BUN 6 - 23 mg/dL _0 Creatinine 0.40 - 1.20 mg/dL 0.69  1.04  0.82   Sodium 135 - 145 mEq/L 136  138  136   Potassium 3.5 - 5.1 mEq/L 3.6  3.6  3.7   Chloride 96 - 112 mEq/L 102  103  105   CO2 19 - 32 mEq/L _1 Calcium 8.4 - 10.5 mg/dL 9.4  8.8  9.0   Total Protein 6.0 - 8.3 g/dL  5.6  6.4   Total Bilirubin 0.2 - 1.2 mg/dL  0.2  0.5   Alkaline Phos 39 - 117 U/L  103  80   AST 0 - 37 U/L  9  15   ALT 0 - 35 U/L  13  15    Lab Results  Component Value Date   LDH 127 08/04/2022    RADIOGRAPHIC STUDIES: I have personally reviewed the radiological images as listed and agreed with the findings in the report. No results found.

## 2022-08-04 NOTE — Assessment & Plan Note (Signed)
Labs reviewed and discussed with patient that Leukocytosis, predominantly neutrophilia, can be secondary to infection, chronic inflammation,  autoimmune disease, or underlying bone marrow disorders.  Patient's clinical history of chronic cough, multiple episodes of asthma flare, indicate that the nature of her leukocytosis likely reactive.  Rule out other etiologies.  For the work up of patient's leukocytosis, I recommend checking CBC;CMP, LDH, smear review, peripheral flowcytometry, myeloma labs, ANA, CRP, PSA, JAK2 mutation with reflex

## 2022-08-04 NOTE — Progress Notes (Signed)
Pt here to establish care for Leukocytosis.

## 2022-08-07 LAB — KAPPA/LAMBDA LIGHT CHAINS
Kappa free light chain: 10.5 mg/L (ref 3.3–19.4)
Kappa, lambda light chain ratio: 1.38 (ref 0.26–1.65)
Lambda free light chains: 7.6 mg/L (ref 5.7–26.3)

## 2022-08-08 LAB — MULTIPLE MYELOMA PANEL, SERUM
Albumin SerPl Elph-Mcnc: 3.7 g/dL (ref 2.9–4.4)
Albumin/Glob SerPl: 1.5 (ref 0.7–1.7)
Alpha 1: 0.2 g/dL (ref 0.0–0.4)
Alpha2 Glob SerPl Elph-Mcnc: 0.8 g/dL (ref 0.4–1.0)
B-Globulin SerPl Elph-Mcnc: 0.9 g/dL (ref 0.7–1.3)
Gamma Glob SerPl Elph-Mcnc: 0.6 g/dL (ref 0.4–1.8)
Globulin, Total: 2.5 g/dL (ref 2.2–3.9)
IgA: 113 mg/dL (ref 87–352)
IgG (Immunoglobin G), Serum: 631 mg/dL (ref 586–1602)
IgM (Immunoglobulin M), Srm: 65 mg/dL (ref 26–217)
Total Protein ELP: 6.2 g/dL (ref 6.0–8.5)

## 2022-08-08 LAB — ANTINUCLEAR ANTIBODIES, IFA: ANA Ab, IFA: NEGATIVE

## 2022-08-08 LAB — COMP PANEL: LEUKEMIA/LYMPHOMA

## 2022-08-10 LAB — CALR +MPL + E12-E15  (REFLEX)

## 2022-08-10 LAB — JAK2 V617F RFX CALR/MPL/E12-15

## 2022-08-21 ENCOUNTER — Ambulatory Visit: Payer: BC Managed Care – PPO | Admitting: Internal Medicine

## 2022-08-21 ENCOUNTER — Other Ambulatory Visit: Payer: Self-pay | Admitting: Internal Medicine

## 2022-08-21 DIAGNOSIS — J4541 Moderate persistent asthma with (acute) exacerbation: Secondary | ICD-10-CM

## 2022-08-21 MED ORDER — ALBUTEROL SULFATE HFA 108 (90 BASE) MCG/ACT IN AERS: INHALATION_SPRAY | RESPIRATORY_TRACT | 0 refills | Status: DC

## 2022-09-01 ENCOUNTER — Inpatient Hospital Stay: Payer: BC Managed Care – PPO | Admitting: Oncology

## 2022-09-06 ENCOUNTER — Ambulatory Visit: Payer: BC Managed Care – PPO | Admitting: Oncology

## 2022-10-04 ENCOUNTER — Other Ambulatory Visit: Payer: Self-pay | Admitting: Internal Medicine

## 2022-10-13 ENCOUNTER — Ambulatory Visit
Admission: RE | Admit: 2022-10-13 | Discharge: 2022-10-13 | Disposition: A | Discharge status: Home / Self Care | Payer: BC Managed Care – PPO | Source: Ambulatory Visit | Attending: Oncology | Admitting: Oncology

## 2022-10-13 ENCOUNTER — Inpatient Hospital Stay: Payer: BC Managed Care – PPO | Attending: Oncology | Admitting: Oncology

## 2022-10-13 ENCOUNTER — Encounter: Payer: Self-pay | Admitting: Oncology

## 2022-10-13 VITALS — BP 144/92 | HR 110 | Temp 97.4°F | Wt 183.1 lb

## 2022-10-13 DIAGNOSIS — R051 Acute cough: Secondary | ICD-10-CM

## 2022-10-13 DIAGNOSIS — G8929 Other chronic pain: Secondary | ICD-10-CM | POA: Insufficient documentation

## 2022-10-13 DIAGNOSIS — R059 Cough, unspecified: Secondary | ICD-10-CM | POA: Diagnosis not present

## 2022-10-13 DIAGNOSIS — D472 Monoclonal gammopathy: Secondary | ICD-10-CM | POA: Insufficient documentation

## 2022-10-13 DIAGNOSIS — Z87891 Personal history of nicotine dependence: Secondary | ICD-10-CM | POA: Insufficient documentation

## 2022-10-13 DIAGNOSIS — D72828 Other elevated white blood cell count: Secondary | ICD-10-CM | POA: Diagnosis not present

## 2022-10-13 DIAGNOSIS — D72829 Elevated white blood cell count, unspecified: Secondary | ICD-10-CM | POA: Diagnosis not present

## 2022-10-13 NOTE — Assessment & Plan Note (Signed)
I discussed with patient about the diagnosis of IgA MGUS which is an asymptomatic condition which has a risk of progression to smoldering multiple myeloma and to symptomatic multiple myeloma. Less frequently, these patients progress to AL amyloidosis, light chain deposition disease, or another lymphoproliferative disorder.For now I recommend observation. Check SPEP and light chain ratio in 4 months

## 2022-10-13 NOTE — Progress Notes (Signed)
Hematology/Oncology Consult note [phone] [phone]     Patient Care Team: Einar Pheasant, MD as PCP - General (Internal Medicine)  ASSESSMENT & PLAN  MGUS (monoclonal gammopathy of unknown significance) I discussed with patient about the diagnosis of IgA MGUS which is an asymptomatic condition which has a risk of progression to smoldering multiple myeloma and to symptomatic multiple myeloma. Less frequently, these patients progress to AL amyloidosis, light chain deposition disease, or another lymphoproliferative disorder.For now I recommend observation. Check SPEP and light chain ratio in 4 months  Cough Follow-up with pulmonology.  Check chest x-ray   Orders Placed This Encounter  Procedures   DG Chest 2 View    Standing Status:   Future    Number of Occurrences:   1    Standing Expiration Date:   10/13/2023    Order Specific Question:   Reason for Exam (SYMPTOM  OR DIAGNOSIS REQUIRED)    Answer:   cough    Order Specific Question:   Is patient pregnant?    Answer:   No    Order Specific Question:   Preferred imaging location?    Answer:   [facility]   CBC with Differential/Platelet    Standing Status:   Future    Standing Expiration Date:   10/13/2023   Comprehensive metabolic panel    Standing Status:   Future    Standing Expiration Date:   10/13/2023   Multiple Myeloma Panel (SPEP&IFE w/QIG)    Standing Status:   Future    Standing Expiration Date:   10/13/2023   Kappa/lambda light chains    Standing Status:   Future    Standing Expiration Date:   10/13/2023   IFE+PROTEIN ELECTRO, 24-HR UR    Standing Status:   Future    Standing Expiration Date:   10/14/2023   Miscellaneous [facility] test (send-out)    Standing Status:   Future    Standing Expiration Date:   10/14/2023    Order Specific Question:   Test name / description:    Answer:   NT-proBNP [facility] [id number]   Follow-up 4 months. All questions were answered. The patient knows to call  the clinic with any problems, questions or concerns.   Earlie Server, MD, PhD [facility] Hematology Oncology 10/13/2022       CHIEF COMPLAINTS/PURPOSE OF CONSULTATION:  Leukocytosis/elevate white count  HISTORY OF PRESENTING ILLNESS:  Heather [name] 47 y.o. female is here because of elevated WBC.  Patient has chronic leukocytosis predominantly neutrophilia.  Dated back to 2016. Patient reports history of IBS, asthma, chronic cough, known COVID symptoms. She has asthma flare multiple times per year, this year she has had steroid treatments 4-5 times.  Currently not on steroids.  T patient reports that each time with asthma flare, her white count goes up to 18-19, and if she takes antibiotics, leukocytosis improves.  Patient uses steroid inhaler 2-3 times per day. Patient has a seizure disorder, she is on Depakote, There is not reported symptoms of urinary frequency/urgency or dysuria, diarrhea, or abnormal skin rash.  Endorses chronic hip pain, lower extremity pain.  No hand joints deformity or pain. Denies any unintentional weight loss, night sweats or fever.  She denies smoking currently. She had no prior history or diagnosis of cancer.  Her age appropriate screening programs are up-to-date. The patient has no prior diagnosis of autoimmune disease and was not prescribed corticosteroids related products.  INTERVAL HISTORY Heather [name] is a 47 y.o. female  who has above history reviewed by me today presents for follow up visit for leukocytosis. Patient reports acute on chronic cough spells.  She has history of asthma.  MEDICAL HISTORY:  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 hauler    Depression    bipolar   Dysrhythmia    Fainting spell    H/O   H/O cardiac arrhythmia    Migraines    Seizures ([place])     SURGICAL HISTORY: Past Surgical History:  Procedure Laterality Date   COLONOSCOPY WITH PROPOFOL N/A 06/03/2021   Procedure: COLONOSCOPY WITH  PROPOFOL;  Surgeon: Virgel Manifold, MD;  Location: [facility] ENDOSCOPY;  Service: Endoscopy;  Laterality: N/A;   ESOPHAGOGASTRODUODENOSCOPY N/A 06/03/2021   Procedure: ESOPHAGOGASTRODUODENOSCOPY (EGD);  Surgeon: Virgel Manifold, MD;  Location: [facility] ENDOSCOPY;  Service: Endoscopy;  Laterality: N/A;   TUBAL LIGATION Bilateral 09/25/2002   tubal reversal      SOCIAL HISTORY: Social History   Socioeconomic History   Marital status: Married    Spouse name: Not on file   Number of children: Not on file   Years of education: Not on file   Highest education level: Not on file  Occupational History   Not on file  Tobacco Use   Smoking status: Former    Packs/day: 0.50    Years: 5.00    Total pack years: 2.50    Types: Cigarettes    Quit date: 2013    Years since quitting: 11.0   Smokeless tobacco: Never   Tobacco comments:    Quit mid October 2014  Vaping Use   Vaping Use: Never used  Substance and Sexual Activity   Alcohol use: No    Alcohol/week: 0.0 standard drinks of alcohol   Drug use: No   Sexual activity: Yes    Birth control/protection: None  Other Topics Concern   Not on file  Social History Narrative   Not on file   Social Determinants of Health   Financial Resource Strain: Not on file  Food Insecurity: Not on file  Transportation Needs: Not on file  Physical Activity: Not on file  Stress: Not on file  Social Connections: Not on file  Intimate Partner Violence: Not on file    FAMILY HISTORY: Family History  Problem Relation Age of Onset   Hypertension Mother    Diabetes Mother    Arthritis Mother    Heart disease Mother    Breast cancer Neg Hx     ALLERGIES:  is allergic to levaquin [levofloxacin], tramadol, amoxicillin, augmentin [amoxicillin-pot clavulanate], erythromycin, and lithium.  MEDICATIONS:  Current Outpatient Medications  Medication Sig Dispense Refill   albuterol (VENTOLIN HFA) 108 (90 Base) MCG/ACT inhaler INHALE 1 TO 2 PUFFS BY  MOUTH EVERY 4 HOURS AS NEEDED FOR WHEEZING OR SHORTNESS OF BREATH 18 g 0   benzonatate (TESSALON) 200 MG capsule Take 1 capsule (200 mg total) by mouth 3 (three) times daily as needed for cough. 45 capsule 1   Budeson-Glycopyrrol-Formoterol (BREZTRI AEROSPHERE) 160-9-4.8 MCG/ACT AERO Inhale 2 puffs into the lungs in the morning and at bedtime. 1 each 5   cetirizine (ZYRTEC) 10 MG tablet Take 1 tablet by mouth once daily 90 tablet 3   diazepam (VALIUM) 10 MG tablet Take 1 tablet by mouth 4 (four) times daily as needed.     divalproex (DEPAKOTE) 500 MG DR tablet Take by mouth.     fluticasone (FLONASE) 50 MCG/ACT nasal spray Place 1  spray into both nostrils daily. 16 g 0   ipratropium-albuterol (DUONEB) 0.5-2.5 (3) MG/3ML SOLN Inhale 3 mLs into the lungs every 4 (four) hours as needed. 360 mL 11   magnesium oxide (MAG-OX) 400 MG tablet Take 1 tablet (400 mg total) by mouth daily. 30 tablet 2   ondansetron (ZOFRAN-ODT) 4 MG disintegrating tablet Take 1 tablet (4 mg total) by mouth 2 (two) times daily as needed for nausea or vomiting. 20 tablet 0   Acetylcysteine 600 MG TBCR Take by mouth.     omeprazole (PRILOSEC) 40 MG capsule Take 1 capsule (40 mg total) by mouth daily before breakfast. (Patient not taking: Reported on 10/13/2022) 30 capsule 3   No current facility-administered medications for this visit.    Review of Systems  Constitutional:  Negative for appetite change, chills, fatigue and fever.  HENT:   Negative for hearing loss and voice change.   Eyes:  Negative for eye problems.  Respiratory:  Positive for cough. Negative for chest tightness.   Cardiovascular:  Negative for chest pain.  Gastrointestinal:  Negative for abdominal distention, abdominal pain and blood in stool.  Endocrine: Negative for hot flashes.  Genitourinary:  Negative for difficulty urinating and frequency.   Musculoskeletal:  Positive for arthralgias.  Skin:  Negative for itching and rash.  Neurological:  Negative  for extremity weakness.  Hematological:  Negative for adenopathy.  Psychiatric/Behavioral:  Negative for confusion.      PHYSICAL EXAMINATION: ECOG PERFORMANCE STATUS: 0 - Asymptomatic  Vitals:   10/13/22 1012  BP: (!) 144/92  Pulse: (!) 110  Temp: (!) 97.4 F (36.3 C)  SpO2: 99%   Filed Weights   10/13/22 1012  Weight: 183 lb 1.6 oz (83.1 kg)    Physical Exam Constitutional:      General: She is not in acute distress.    Appearance: She is not diaphoretic.  HENT:     Head: Normocephalic and atraumatic.     Mouth/Throat:     Pharynx: No oropharyngeal exudate.  Eyes:     General: No scleral icterus. Cardiovascular:     Rate and Rhythm: Normal rate.  Pulmonary:     Effort: Pulmonary effort is normal. No respiratory distress.  Abdominal:     General: There is no distension.     Palpations: Abdomen is soft.     Tenderness: There is no abdominal tenderness.  Musculoskeletal:        General: Normal range of motion.     Cervical back: Normal range of motion and neck supple.  Skin:    Findings: No erythema.  Neurological:     Mental Status: [name] and oriented to person, place, and time. Mental status is at baseline.     Cranial Nerves: No cranial nerve deficit.     Motor: No abnormal muscle tone.  Psychiatric:        Mood and Affect: Affect normal.     LABORATORY DATA:  I have reviewed the data as listed    Latest Ref Rng & Units 08/04/2022   11:45 AM 07/19/2022    7:48 AM 07/10/2022    7:44 AM  CBC  WBC 4.0 - 10.5 K/uL 15.0  16.8  15.2   Hemoglobin 12.0 - 15.0 g/dL 13.6  13.8  12.4   Hematocrit 36.0 - 46.0 % 40.4  41.6  37.4   Platelets 150 - 400 K/uL 301  296.0  233.0       Latest Ref Rng & Units 07/19/2022  7:48 AM 07/10/2022    7:44 AM 08/29/2021    8:12 AM  CMP  Glucose 70 - 99 mg/dL 92  87  78   BUN 6 - 23 mg/dL '12  14  9   '$ Creatinine 0.40 - 1.20 mg/dL 0.69  1.04  0.82   Sodium 135 - 145 mEq/L 136  138  136   Potassium 3.5 - 5.1 mEq/L  3.6  3.6  3.7   Chloride 96 - 112 mEq/L 102  103  105   CO2 19 - 32 mEq/L '25  25  24   '$ Calcium 8.4 - 10.5 mg/dL 9.4  8.8  9.0   Total Protein 6.0 - 8.3 g/dL  5.6  6.4   Total Bilirubin 0.2 - 1.2 mg/dL  0.2  0.5   Alkaline Phos 39 - 117 U/L  103  80   AST 0 - 37 U/L  9  15   ALT 0 - 35 U/L  13  15    Lab Results  Component Value Date   LDH 127 08/04/2022    RADIOGRAPHIC STUDIES: I have personally reviewed the radiological images as listed and agreed with the findings in the report. No results found.

## 2022-10-13 NOTE — Assessment & Plan Note (Signed)
Follow-up with pulmonology.  Check chest x-ray

## 2022-10-26 ENCOUNTER — Encounter: Payer: Self-pay | Admitting: Internal Medicine

## 2022-10-26 ENCOUNTER — Ambulatory Visit (INDEPENDENT_AMBULATORY_CARE_PROVIDER_SITE_OTHER): Payer: BC Managed Care – PPO | Admitting: Internal Medicine

## 2022-10-26 VITALS — BP 130/78 | HR 98 | Temp 97.9°F | Resp 16 | Ht 66.5 in | Wt 180.2 lb

## 2022-10-26 DIAGNOSIS — J4541 Moderate persistent asthma with (acute) exacerbation: Secondary | ICD-10-CM

## 2022-10-26 DIAGNOSIS — F419 Anxiety disorder, unspecified: Secondary | ICD-10-CM | POA: Diagnosis not present

## 2022-10-26 DIAGNOSIS — R87619 Unspecified abnormal cytological findings in specimens from cervix uteri: Secondary | ICD-10-CM

## 2022-10-26 DIAGNOSIS — G40909 Epilepsy, unspecified, not intractable, without status epilepticus: Secondary | ICD-10-CM

## 2022-10-26 DIAGNOSIS — E7800 Pure hypercholesterolemia, unspecified: Secondary | ICD-10-CM

## 2022-10-26 DIAGNOSIS — R059 Cough, unspecified: Secondary | ICD-10-CM

## 2022-10-26 DIAGNOSIS — D472 Monoclonal gammopathy: Secondary | ICD-10-CM

## 2022-10-26 MED ORDER — PREDNISONE 10 MG PO TABS: ORAL_TABLET | ORAL | 0 refills | Status: DC

## 2022-10-26 MED ORDER — AZITHROMYCIN 250 MG PO TABS: ORAL_TABLET | ORAL | 0 refills | Status: AC

## 2022-10-26 MED ORDER — BENZONATATE 100 MG PO CAPS: 100.0000 mg | ORAL_CAPSULE | Freq: Three times a day (TID) | ORAL | 0 refills | Status: DC | PRN

## 2022-10-26 NOTE — Progress Notes (Signed)
Subjective:    Patient ID: Heather [name], female    DOB: [birth year], 46 y.o.   MRN: [id number]  Patient here for  Chief Complaint  Patient presents with   Medical Management of Chronic Issues    HPI Here to follow up regarding persistent cough and chest congestion as well as recent diagnosis of MGUS.  Seeing Dr Tasia Catchings.  Following SPEP.  Reports that starting 09/19/22 - cough, weak, fever and sob.  Evaluated - "[facility]".  Was given abx.  Using nebs.  Took tessalon perles and prednisone.  Reports persistent chest congestion and cough.  Occasionally productive.  Coughing fits.  Taking mucnex and delsym/tessalon perles.  Also using inhaler.  Has changed positions at her work.  Working from home.  Handling stress.  Eating. Dr Tasia Catchings ordered cxr - 10/13/22 - lungs clear.     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 hauler    Depression    bipolar   Dysrhythmia    Fainting spell    H/O   H/O cardiac arrhythmia    Migraines    Seizures ([place])    Past Surgical History:  Procedure Laterality Date   COLONOSCOPY WITH PROPOFOL N/A 06/03/2021   Procedure: COLONOSCOPY WITH PROPOFOL;  Surgeon: Virgel Manifold, MD;  Location: [facility] ENDOSCOPY;  Service: Endoscopy;  Laterality: N/A;   ESOPHAGOGASTRODUODENOSCOPY N/A 06/03/2021   Procedure: ESOPHAGOGASTRODUODENOSCOPY (EGD);  Surgeon: Virgel Manifold, MD;  Location: [facility] ENDOSCOPY;  Service: Endoscopy;  Laterality: N/A;   TUBAL LIGATION Bilateral 09/25/2002   tubal reversal     Family History  Problem Relation Age of Onset   Hypertension Mother    Diabetes Mother    Arthritis Mother    Heart disease Mother    Breast cancer Neg Hx    Social History   Socioeconomic History   Marital status: Married    Spouse name: Not on file   Number of children: Not on file   Years of education: Not on file   Highest education level: Not on file  Occupational History   Not on file  Tobacco Use   Smoking status: Former     Packs/day: 0.50    Years: 5.00    Total pack years: 2.50    Types: Cigarettes    Quit date: 2013    Years since quitting: 11.0   Smokeless tobacco: Never   Tobacco comments:    Quit mid October 2014  Vaping Use   Vaping Use: Never used  Substance and Sexual Activity   Alcohol use: No    Alcohol/week: 0.0 standard drinks of alcohol   Drug use: No   Sexual activity: Yes    Birth control/protection: None  Other Topics Concern   Not on file  Social History Narrative   Not on file   Social Determinants of Health   Financial Resource Strain: Not on file  Food Insecurity: Not on file  Transportation Needs: Not on file  Physical Activity: Not on file  Stress: Not on file  Social Connections: Not on file     Review of Systems  Constitutional:  Negative for fever and unexpected weight change.  HENT:  Positive for congestion. Negative for sinus pressure.   Respiratory:  Positive for cough and wheezing. Negative for chest tightness.   Cardiovascular:  Negative for chest pain, palpitations and leg swelling.  Gastrointestinal:  Negative for abdominal pain, diarrhea, nausea and vomiting.  Genitourinary:  Negative for  difficulty urinating and dysuria.  Musculoskeletal:  Negative for joint swelling and myalgias.  Skin:  Negative for color change and rash.  Neurological:  Negative for dizziness and headaches.  Psychiatric/Behavioral:  Negative for agitation and dysphoric mood.        Objective:     BP 130/78   Pulse 98   Temp 97.9 F (36.6 C)   Resp 16   Ht 5' 6.5" (1.689 m)   Wt 180 lb 3.2 oz (81.7 kg)   SpO2 99%   BMI 28.65 kg/m  Wt Readings from Last 3 Encounters:  10/26/22 180 lb 3.2 oz (81.7 kg)  10/13/22 183 lb 1.6 oz (83.1 kg)  08/04/22 174 lb 3.2 oz (79 kg)    Physical Exam Vitals reviewed.  Constitutional:      General: She is not in acute distress.    Appearance: Normal appearance.  HENT:     Head: Normocephalic and atraumatic.     Right Ear: External  ear normal.     Left Ear: External ear normal.  Eyes:     General: No scleral icterus.       Right eye: No discharge.        Left eye: No discharge.     Conjunctiva/sclera: Conjunctivae normal.  Neck:     Thyroid: No thyromegaly.  Cardiovascular:     Rate and Rhythm: Normal rate and regular rhythm.  Pulmonary:     Effort: No respiratory distress.     Comments: Increased cough with forced expiration.  Abdominal:     General: Bowel sounds are normal.     Palpations: Abdomen is soft.     Tenderness: There is no abdominal tenderness.  Musculoskeletal:        General: No swelling or tenderness.     Cervical back: Neck supple. No tenderness.  Lymphadenopathy:     Cervical: No cervical adenopathy.  Skin:    Findings: No erythema or rash.  Neurological:     Mental Status: [name].  Psychiatric:        Mood and Affect: Mood normal.        Behavior: Behavior normal.      Outpatient Encounter Medications as of 10/26/2022  Medication Sig   amphetamine-dextroamphetamine (ADDERALL XR) 20 MG 24 hr capsule Take 20 mg by mouth every morning.   azithromycin (ZITHROMAX) 250 MG tablet Take 2 tablets on day 1, then 1 tablet daily on days 2 through 5   benzonatate (TESSALON PERLES) 100 MG capsule Take 1 capsule (100 mg total) by mouth 3 (three) times daily as needed for cough.   predniSONE (DELTASONE) 10 MG tablet Take 6 tablets x 1 day and then decrease by 1/2 tablet per day until down to zero mg.   Acetylcysteine 600 MG TBCR Take by mouth.   albuterol (VENTOLIN HFA) 108 (90 Base) MCG/ACT inhaler INHALE 1 TO 2 PUFFS BY MOUTH EVERY 4 HOURS AS NEEDED FOR WHEEZING OR SHORTNESS OF BREATH   Budeson-Glycopyrrol-Formoterol (BREZTRI AEROSPHERE) 160-9-4.8 MCG/ACT AERO Inhale 2 puffs into the lungs in the morning and at bedtime.   cetirizine (ZYRTEC) 10 MG tablet Take 1 tablet by mouth once daily   diazepam (VALIUM) 10 MG tablet Take 1 tablet by mouth 4 (four) times daily as needed.   divalproex  (DEPAKOTE) 500 MG DR tablet Take by mouth.   fluticasone (FLONASE) 50 MCG/ACT nasal spray Place 1 spray into both nostrils daily.   ipratropium-albuterol (DUONEB) 0.5-2.5 (3) MG/3ML SOLN Inhale 3 mLs into  the lungs every 4 (four) hours as needed.   magnesium oxide (MAG-OX) 400 MG tablet Take 1 tablet (400 mg total) by mouth daily.   omeprazole (PRILOSEC) 40 MG capsule Take 1 capsule (40 mg total) by mouth daily before breakfast. (Patient not taking: Reported on 10/13/2022)   ondansetron (ZOFRAN-ODT) 4 MG disintegrating tablet Take 1 tablet (4 mg total) by mouth 2 (two) times daily as needed for nausea or vomiting.   [DISCONTINUED] benzonatate (TESSALON) 200 MG capsule Take 1 capsule (200 mg total) by mouth 3 (three) times daily as needed for cough.   No facility-administered encounter medications on file as of 10/26/2022.     Lab Results  Component Value Date   WBC 15.0 (H) 08/04/2022   HGB 13.6 08/04/2022   HCT 40.4 08/04/2022   PLT 301 08/04/2022   GLUCOSE 92 07/19/2022   CHOL 207 (H) 07/10/2022   TRIG 350.0 (H) 07/10/2022   HDL 36.60 (L) 07/10/2022   LDLDIRECT 136.0 07/10/2022   LDLCALC 134 (H) 08/29/2021   ALT 13 07/10/2022   AST 9 07/10/2022   NA 136 07/19/2022   K 3.6 07/19/2022   CL 102 07/19/2022   CREATININE 0.69 07/19/2022   BUN 12 07/19/2022   CO2 25 07/19/2022   TSH 1.47 08/29/2021    DG Chest 2 View  Result Date: 10/13/2022 CLINICAL DATA:  cough EXAM: CHEST - 2 VIEW COMPARISON:  05/19/2022 FINDINGS: Cardiac silhouette is unremarkable. No pneumothorax or pleural effusion. The lungs are clear. The visualized skeletal structures are unremarkable. IMPRESSION: No acute cardiopulmonary process. Electronically Signed   By: Sammie Bench M.D.   On: 10/13/2022 23:23       Assessment & Plan:  Cough, unspecified type Assessment & Plan: Persistent congestion and cough as outlined.  Saline nasal spray and flonase as directed.  Zpak. CXR - recent - lungs clear.  Continue  trelegy.  Has nebs.  Prednisone taper as directed.  Follow.  Seeing pulmonary now.     Abnormal cervical Papanicolaou smear, unspecified abnormal pap finding Assessment & Plan: Saw Dr Amalia Hailey 02/2022 - recommended f/u pap in one year.    Anxiety Assessment & Plan: Followed by psychiatry.    Moderate persistent asthma with acute exacerbation Assessment & Plan: Treat current infection and congestion as outlined.  Continue trelegy.  Follow.    Hypercholesterolemia Assessment & Plan: Low cholesterol diet and exercise.  Follow lipid panel.    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  Followed by Dr Tasia Catchings.  Monitoring.  (Following SPEP)   Seizure disorder [facility]) Assessment & Plan: Being followed by neurology.     Other orders -     predniSONE; Take 6 tablets x 1 day and then decrease by 1/2 tablet per day until down to zero mg.  Dispense: 38 tablet; Refill: 0 -     Benzonatate; Take 1 capsule (100 mg total) by mouth 3 (three) times daily as needed for cough.  Dispense: 21 capsule; Refill: 0 -     Azithromycin; Take 2 tablets on day 1, then 1 tablet daily on days 2 through 5  Dispense: 6 tablet; Refill: 0     Einar Pheasant, MD

## 2022-10-29 ENCOUNTER — Encounter: Payer: Self-pay | Admitting: Internal Medicine

## 2022-10-29 NOTE — Assessment & Plan Note (Signed)
Treat current infection and congestion as outlined.  Continue trelegy.  Follow.

## 2022-10-29 NOTE — Assessment & Plan Note (Signed)
Persistent congestion and cough as outlined.  Saline nasal spray and flonase as directed.  Zpak. CXR - recent - lungs clear.  Continue trelegy.  Has nebs.  Prednisone taper as directed.  Follow.  Seeing pulmonary now.

## 2022-10-29 NOTE — Assessment & Plan Note (Signed)
Followed by psychiatry 

## 2022-10-29 NOTE — Assessment & Plan Note (Signed)
Being followed by neurology.

## 2022-10-29 NOTE — Assessment & Plan Note (Signed)
Saw Dr Amalia Hailey 02/2022 - recommended f/u pap in one year.

## 2022-10-29 NOTE — Assessment & Plan Note (Signed)
Low cholesterol diet and exercise.  Follow lipid panel.   

## 2022-10-29 NOTE — Assessment & Plan Note (Signed)
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  Followed by Dr Tasia Catchings.  Monitoring.  (Following SPEP)

## 2022-12-01 ENCOUNTER — Other Ambulatory Visit: Payer: Self-pay | Admitting: Internal Medicine

## 2022-12-01 DIAGNOSIS — J4541 Moderate persistent asthma with (acute) exacerbation: Secondary | ICD-10-CM

## 2022-12-14 DIAGNOSIS — G4733 Obstructive sleep apnea (adult) (pediatric): Secondary | ICD-10-CM | POA: Diagnosis not present

## 2022-12-14 DIAGNOSIS — G43719 Chronic migraine without aura, intractable, without status migrainosus: Secondary | ICD-10-CM | POA: Diagnosis not present

## 2022-12-14 DIAGNOSIS — F445 Conversion disorder with seizures or convulsions: Secondary | ICD-10-CM | POA: Diagnosis not present

## 2022-12-14 DIAGNOSIS — R413 Other amnesia: Secondary | ICD-10-CM | POA: Diagnosis not present

## 2022-12-14 DIAGNOSIS — U099 Post covid-19 condition, unspecified: Secondary | ICD-10-CM | POA: Diagnosis not present

## 2023-01-16 ENCOUNTER — Other Ambulatory Visit: Payer: Self-pay | Admitting: Internal Medicine

## 2023-01-16 DIAGNOSIS — J4541 Moderate persistent asthma with (acute) exacerbation: Secondary | ICD-10-CM

## 2023-01-16 MED ORDER — ONDANSETRON 4 MG PO TBDP: 4.0000 mg | ORAL_TABLET | Freq: Two times a day (BID) | ORAL | 0 refills | Status: DC | PRN

## 2023-01-16 MED ORDER — ALBUTEROL SULFATE HFA 108 (90 BASE) MCG/ACT IN AERS: 2.0000 | INHALATION_SPRAY | Freq: Four times a day (QID) | RESPIRATORY_TRACT | 1 refills | Status: DC | PRN

## 2023-01-16 NOTE — Telephone Encounter (Signed)
Okay to refill?  Albuterol inhaler: Last ordered: 1 month ago (12/01/2022) by Dale [place], MD   Zofran last filled in October 2023

## 2023-01-18 ENCOUNTER — Ambulatory Visit: Payer: BC Managed Care – PPO | Attending: Neurology | Admitting: Speech Pathology

## 2023-01-18 DIAGNOSIS — R41841 Cognitive communication deficit: Secondary | ICD-10-CM | POA: Diagnosis not present

## 2023-01-18 DIAGNOSIS — R4189 Other symptoms and signs involving cognitive functions and awareness: Secondary | ICD-10-CM | POA: Insufficient documentation

## 2023-01-18 NOTE — Therapy (Unsigned)
OUTPATIENT SPEECH LANGUAGE PATHOLOGY  COGNITION EVALUATION   Patient Name: Heather [name] MRN: [id number] DOB:10-Aug-1976, 47 y.o., female Today's Date: 4/25/20248  PCP: Dale , MD REFERRING PROVIDER: Cristopher Peru, MD   End of Session - 01/18/23 1559     Visit Number 1    Number of Visits 25    Date for SLP Re-Evaluation 04/12/23    Authorization Type [employer] PPO    Progress Note Due on Visit 10    SLP Start Time 0915    SLP Stop Time  1000    SLP Time Calculation (min) 45 min    Activity Tolerance Patient tolerated treatment well             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 hauler    Depression    bipolar   Dysrhythmia    Fainting spell    H/O   H/O cardiac arrhythmia    Migraines    Seizures ([facility])    Past Surgical History:  Procedure Laterality Date   COLONOSCOPY WITH PROPOFOL N/A 06/03/2021   Procedure: COLONOSCOPY WITH PROPOFOL;  Surgeon: Pasty Spillers, MD;  Location: [facility] ENDOSCOPY;  Service: Endoscopy;  Laterality: N/A;   ESOPHAGOGASTRODUODENOSCOPY N/A 06/03/2021   Procedure: ESOPHAGOGASTRODUODENOSCOPY (EGD);  Surgeon: Pasty Spillers, MD;  [facility] ENDOSCOPY;  Service: Endoscopy;  Laterality: N/A;   TUBAL LIGATION Bilateral 09/25/2002   tubal reversal     Patient Active Problem List   Diagnosis Date Noted   Leg pain 07/19/2022   ADHD 06/30/2022   Irritable bowel syndrome (IBS) 06/29/2022   Abnormal Pap smear of cervix 03/15/2022   Constipation 01/05/2022   Non-intractable vomiting    Hiatal hernia    Retained food in stomach    Special screening for malignant neoplasms, colon    Rectal polyp    Asthmatic bronchitis with acute exacerbation 05/27/2021   Pre-op evaluation 05/24/2021   Anxiety 02/27/2021   Cough 02/25/2021   Vitamin D deficiency 02/04/2021   Fatigue 09/29/2020   Nausea 09/12/2020   Encounter for completion of form with patient 07/18/2020   History of  COVID-19 06/11/2020   Bronchitis 09/22/2019   Sleep disturbance 03/16/2019   Herpes zoster without complication 01/29/2019   History of seizure 01/22/2019   GERD (gastroesophageal reflux disease) 01/05/2019   Back pain 12/21/2018   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   Abdominal pain 11/19/2018   Conjunctivitis 05/23/2018   Sleeping difficulty 10/31/2017   Rash 06/11/2017   Health care maintenance 10/12/2016   History of miscarriage 10/12/2016   Hypercholesterolemia 07/09/2016   Migraine 07/20/2013   Seizure disorder 07/20/2013   Asthma 07/20/2013   Alternating constipation and diarrhea 07/20/2013   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    ONSET DATE: September 2022 (initial dx of COVID);  12/19/2021 (date of referral)  REFERRING DIAG: R41.89 (ICD-10-CM) - Cognitive impairment   THERAPY DIAG:  Cognitive communication deficit  Cognitive impairment  Rationale for Evaluation and Treatment Rehabilitation  SUBJECTIVE:   SUBJECTIVE STATEMENT: Pt appears to be good historian for current deficits; pt intermittently emotional about current deficits Pt accompanied by: self  PERTINENT HISTORY: Pt is a 47 year old female with long COVID in an unvaccinated person (05/2021) who has history of fatigue, short term memory concerns, pseudoseizures (23 hour ambulatory EEG on 07/27/2022 was normal), migraine without aura (recently worsened d/t pt being out of medicine), obstructive sleep apnea on CPAP,  and bipolar affective disorder (depressed with moderate anxiety).   DIAGNOSTIC FINDINGS:  MRI brain with and without contrast 05/20/2022: IMPRESSION:  Mildly limited study due to susceptibility artifact. Symmetric hippocampi. Diffuse mild volume loss.   PAIN:  Are you having pain? No   FALLS: Has patient fallen in last 6 months?  No  LIVING ENVIRONMENT: Lives with: lives with their spouse Lives in: House/apartment  PLOF:  Level of assistance:  Independent with ADLs, Independent with IADLs Employment: Armed forces training and education officer; works from home   PATIENT GOALS   to improve cognitive communication abilites  OBJECTIVE:   COGNITIVE COMMUNICATION Overall cognitive status: Impaired and No family/caregiver present to determine baseline cognitive functioning Areas of impairment:  Memory: Impaired: Immediate Working Teacher, music term Estate agent Impairments: new learning is difficult d/t deficits in working memory, procedural memory related to work tasks  AUDITORY COMPREHENSION  Overall auditory comprehension: Appears intact YES/NO questions: Appears intact Following directions: Appears intact Conversation: Complex Interfering components: processing speed and working Research scientist (life sciences): repetition/stressing words and written cues  READING COMPREHENSION: Intact  EXPRESSION: verbal  VERBAL EXPRESSION:   Overall verbal expression: Appears intact Level of generative/spontaneous verbalization: conversation Automatic speech: name: intact, social response: intact, day of week: intact, and month of year: intact  Repetition: Appears intact Naming: Responsive: 76-100%, Confrontation: 76-100%, Convergent: 76-100%, and Divergent: 51-75% Pragmatics: Impaired: abnormal effect and eye contact Comments: pt appeared reserved, flat affect Interfering components:  N/A Non-verbal means of communication: N/A  WRITTEN EXPRESSION: Dominant hand: right Written expression: Appears intact  ORAL MOTOR EXAMINATION Facial : WFL Lingual: WFL Velum: WFL Mandible: WFL Cough: WFL Voice: Hoarse, Breathy; pt reports hoarse vocal quality d/t asthma and allergies  MOTOR SPEECH: Overall motor speech: Appears intact Level of impairment:  N/A Respiration: diaphragmatic/abdominal breathing Phonation: breathy and hoarse; chronic d/t pt reported allergies Resonance: hyponasality; chronic d/t pt reported  allergies Articulation: Appears intact Intelligibility: Intelligible Motor planning: Appears intact Motor speech errors:  N/A    STANDARDIZED ASSESSMENTS:   Cognitive Linguistic Quick Test: AGE - 18 - 69   The Cognitive Linguistic Quick Test (CLQT) was administered to assess the relative status of five cognitive domains: attention, memory, language, executive functioning, and visuospatial skills. Scores from 10 tasks were used to estimate severity ratings (standardized for age groups 18-69 years and 70-89 years) for each domain, a clock drawing task, as well as an overall composite severity rating of cognition.       Task Score Criterion Cut Scores  Personal Facts 8/8 8  Symbol Cancellation 12/12 11  Confrontation Naming 10/10 10  Clock Drawing  5/13 12  Story Retelling 3/10 6  Symbol Trails 10/10 9  Generative Naming 4/9 5  Design Memory 6/6 5  Mazes  3/8 7  Design Generation 10/13 6    Cognitive Domain Composite Score Severity Rating  Attention 178/215 Mild  Memory 138/185 Moderate  Executive Function 27/40 WNL  Language 25/37 Mild  Visuospatial Skills 87/105 WNL  Clock Drawing  5/13 Severe  Composite Severity Rating  Mild     PATIENT REPORTED OUTCOME MEASURES (PROM): To be completed over the next 3 sessions   TODAY'S TREATMENT:  N/A   PATIENT EDUCATION: Education details: results of this assessment, ST POC Person educated: Patient Education method: Explanation and Demonstration Education comprehension: verbalized understanding and needs further education   HOME EXERCISE PROGRAM:   N/A   GOALS:  Goals reviewed with patient? Yes  SHORT TERM GOALS: Target date: 10 sessions  Patient  will comprehend 4 pieces of information from a phone message, using repair skills independently including replaying the message.  Baseline: Goal status: INITIAL  2.  Patient will organize a work related activity with min cues using organizational template designed by  SLP. Baseline:  Goal status: INITIAL  3.  Pt will use strategies to improve memory for important information with 75% acc. with minimal assistance (ie., white board, daily planner/calendar, Apps on phone).  Baseline:  Goal status: INITIAL   LONG TERM GOALS: Target date: 04/12/2023  Pt will report improved cognitive communication via PROM by 5 points at last ST session Baseline:  Goal status: INITIAL  2.  To improve meta-cognitive awareness/executive functions, patient will a) recall at least one success and one challenge related to communication from the previous week and b) identify at least one strategy to improve a similar challenge in the future across 3 consecutive sessions.  Baseline:  Goal status: INITIAL  ASSESSMENT:  CLINICAL IMPRESSION: Patient is a 47 y.o. female who was seen today for a cognitive communication evaluation. Pt presents with mild cognitive eimpairment in a person who struggled with learning throughout childhood years, complicated by Long COVID who is experiencing difficulty with memory and new learning. Strategies that were once helpful such as taking notes, writing down information is no longer working. Specifically, pt has mild deficits in response times, attention, language and moderate deficits in memory (short-term, working memory, procedural, prospective) with average visuospatial and executive function abilities. Suspect pt's performance on the clock drawing is related to pt's lack of exposure d/t age.    OBJECTIVE IMPAIRMENTS include memory. These impairments are limiting patient from return to work, managing medications, managing appointments, managing finances, household responsibilities, and ADLs/IADLs. Factors affecting potential to achieve goals and functional outcome are ability to learn/carryover information. Patient will benefit from skilled SLP services to address above impairments and improve overall function.  REHAB POTENTIAL:  Excellent  PLAN: SLP FREQUENCY: 1-2x/week  SLP DURATION: 12 weeks  PLANNED INTERVENTIONS: Group dysphagia treatment, Environmental controls, Cognitive reorganization, Internal/external aids, Functional tasks, SLP instruction and feedback, Compensatory strategies, and Patient/family education   Edmar Blankenburg B. Dreama Saa, M.S., CCC-SLP, Tree surgeon Certified Brain Injury Specialist [facility]  [facility] Rehabilitation Services Office [phone] Ascom [phone] Fax [phone]

## 2023-01-25 ENCOUNTER — Ambulatory Visit: Payer: BC Managed Care – PPO | Attending: Neurology | Admitting: Speech Pathology

## 2023-01-25 ENCOUNTER — Encounter: Payer: Self-pay | Admitting: Internal Medicine

## 2023-01-25 ENCOUNTER — Ambulatory Visit (INDEPENDENT_AMBULATORY_CARE_PROVIDER_SITE_OTHER): Payer: BC Managed Care – PPO | Admitting: Internal Medicine

## 2023-01-25 VITALS — BP 130/78 | HR 97 | Temp 98.0°F | Resp 16 | Ht 66.5 in | Wt 185.8 lb

## 2023-01-25 DIAGNOSIS — R41841 Cognitive communication deficit: Secondary | ICD-10-CM | POA: Insufficient documentation

## 2023-01-25 DIAGNOSIS — R4189 Other symptoms and signs involving cognitive functions and awareness: Secondary | ICD-10-CM | POA: Diagnosis not present

## 2023-01-25 DIAGNOSIS — D472 Monoclonal gammopathy: Secondary | ICD-10-CM

## 2023-01-25 DIAGNOSIS — G40909 Epilepsy, unspecified, not intractable, without status epilepticus: Secondary | ICD-10-CM

## 2023-01-25 DIAGNOSIS — E559 Vitamin D deficiency, unspecified: Secondary | ICD-10-CM

## 2023-01-25 DIAGNOSIS — K219 Gastro-esophageal reflux disease without esophagitis: Secondary | ICD-10-CM

## 2023-01-25 DIAGNOSIS — K5900 Constipation, unspecified: Secondary | ICD-10-CM

## 2023-01-25 DIAGNOSIS — E7800 Pure hypercholesterolemia, unspecified: Secondary | ICD-10-CM

## 2023-01-25 DIAGNOSIS — R87619 Unspecified abnormal cytological findings in specimens from cervix uteri: Secondary | ICD-10-CM

## 2023-01-25 DIAGNOSIS — J4541 Moderate persistent asthma with (acute) exacerbation: Secondary | ICD-10-CM

## 2023-01-25 DIAGNOSIS — F419 Anxiety disorder, unspecified: Secondary | ICD-10-CM

## 2023-01-25 DIAGNOSIS — K589 Irritable bowel syndrome without diarrhea: Secondary | ICD-10-CM

## 2023-01-25 DIAGNOSIS — F909 Attention-deficit hyperactivity disorder, unspecified type: Secondary | ICD-10-CM

## 2023-01-25 DIAGNOSIS — E538 Deficiency of other specified B group vitamins: Secondary | ICD-10-CM

## 2023-01-25 MED ORDER — VITAMIN D (ERGOCALCIFEROL) 1.25 MG (50000 UNIT) PO CAPS: 50000.0000 [IU] | ORAL_CAPSULE | ORAL | 0 refills | Status: DC

## 2023-01-25 NOTE — Therapy (Signed)
OUTPATIENT SPEECH LANGUAGE PATHOLOGY  TREATMENT NOTE   Patient Name: Heather [name] MRN: [id number] DOB:08-Dec-1975, 47 y.o., female Today's Date: 5/2/20248  PCP: Dale Wawona, MD REFERRING PROVIDER: Cristopher Peru, MD   End of Session - 01/25/23 [phone]     Visit Number 2    Number of Visits 25    Date for SLP Re-Evaluation 04/12/23    Authorization Type [employer] PPO    Progress Note Due on Visit 10    SLP Start Time 0900    SLP Stop Time  1000    SLP Time Calculation (min) 60 min    Activity Tolerance Patient tolerated treatment well             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 hauler    Depression    bipolar   Dysrhythmia    Fainting spell    H/O   H/O cardiac arrhythmia    Migraines    Seizures (HCC)    Past Surgical History:  Procedure Laterality Date   COLONOSCOPY WITH PROPOFOL N/A 06/03/2021   Procedure: COLONOSCOPY WITH PROPOFOL;  Surgeon: Pasty Spillers, MD;  Location: [facility] ENDOSCOPY;  Service: Endoscopy;  Laterality: N/A;   ESOPHAGOGASTRODUODENOSCOPY N/A 06/03/2021   Procedure: ESOPHAGOGASTRODUODENOSCOPY (EGD);  Surgeon: Pasty Spillers, MD;  Location: [facility] ENDOSCOPY;  Service: Endoscopy;  Laterality: N/A;   TUBAL LIGATION Bilateral 09/25/2002   tubal reversal     Patient Active Problem List   Diagnosis Date Noted   Leg pain 07/19/2022   ADHD 06/30/2022   Irritable bowel syndrome (IBS) 06/29/2022   Abnormal Pap smear of cervix 03/15/2022   Constipation 01/05/2022   Non-intractable vomiting    Hiatal hernia    Retained food in stomach    Special screening for malignant neoplasms, colon    Rectal polyp    Asthmatic bronchitis with acute exacerbation 05/27/2021   Pre-op evaluation 05/24/2021   Anxiety 02/27/2021   Cough 02/25/2021   Vitamin D deficiency 02/04/2021   Fatigue 09/29/2020   Nausea 09/12/2020   Encounter for completion of form with patient 07/18/2020   History of COVID-19  06/11/2020   Bronchitis 09/22/2019   Sleep disturbance 03/16/2019   Herpes zoster without complication 01/29/2019   History of seizure 01/22/2019   GERD (gastroesophageal reflux disease) 01/05/2019   Back pain 12/21/2018   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   Abdominal pain 11/19/2018   Conjunctivitis 05/23/2018   Sleeping difficulty 10/31/2017   Rash 06/11/2017   Health care maintenance 10/12/2016   History of miscarriage 10/12/2016   Hypercholesterolemia 07/09/2016   Migraine 07/20/2013   Seizure disorder (HCC) 07/20/2013   Asthma 07/20/2013   Alternating constipation and diarrhea 07/20/2013   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    ONSET DATE: September 2022 (initial dx of COVID);  12/19/2021 (date of referral)  REFERRING DIAG: R41.89 (ICD-10-CM) - Cognitive impairment   THERAPY DIAG:  Cognitive communication deficit  Cognitive impairment  Rationale for Evaluation and Treatment Rehabilitation  SUBJECTIVE:   SUBJECTIVE STATEMENT:   "I have a meeting with my supervisor, not sure what that will be about" Pt accompanied by: self  PERTINENT HISTORY: Pt is a 47 year old female with 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   DIAGNOSTIC FINDINGS:  MRI brain with and without contrast 05/20/2022: IMPRESSION:  Mildly limited study due to susceptibility artifact. Symmetric hippocampi. Diffuse mild volume loss.   PAIN:  Are you having pain? No   FALLS: Has patient fallen in last 6 months?  No  LIVING ENVIRONMENT: Lives with: lives with their spouse Lives in: House/apartment  PLOF:  Level of assistance: Independent with ADLs, Independent with  IADLs Employment: Full-time employment; works from home   PATIENT GOALS   to improve cognitive communication abilites  OBJECTIVE:   TODAY'S TREATMENT:  Skilled treatment session focused on pt's cognitive communication goals. SLP facilitated session by providing the following interventions:  PATIENT REPORTED OUTCOME MEASURES (PROM):  MULTIFACTORIAL MEMORY QUESTIONNAIRE (MMQ)  Administered patient self-reported outcome measure Multifactorial Memory Questionnaire (MMQ). The Multifactorial Memory Questionnaire [facility]) consists of three scales measuring separate aspects of metamemory; Satisfaction, Ability and Strategy.   Pt's responses are converted to T-Scores with severity levels based on pt's T-Score.   Severity Levels (T-score) Very Low - < 20 Low - 20 to 29 Below Average - 30-39 Average - 40 to 60 Above Average - 60 to 70 High - 71 to 80 Very High - > 80  Pt reports:  Low - 20 to 29 Memory Satisfaction (T-score: 28) Low - 20 to 29 Memory Ability (T-score: 23) Average - 40 to 60 use of Memory Strategies (T-score: 57)   The Neuro-QOLT Item Bank v2.0-Cognition Function-Short Form is an eight-item test designed to measure difficulties with cognitive functioning (e.g., memory, attention and decision making or in the application of such abilities to everyday tasks (e.g., planning, organizing, calculating, remembering and learning). Source: Duwaine Maxin, J-S, et al. (2012). Neuro-QOL: brief measures of health-related quality of life for clinical research in neurology. Neurology, 78(23), [phone].   In the past 7 days...  I had to read something several times to understand it. Very Often (several times a day)  My thinking was slow. Very Often (several times a day)  I had to work really hard to pay attention or I would make a mistake. Very Often (several times a day)  I had trouble concentrating. Very Often (several times a day)   How much DIFFICULTY do you currently have...   Reading and following complex instructions (e.g., directions for a new medication)? Somewhat  Planning for and keeping appointments that are not part of your weekly routine (e.g., a therapy or doctor appointment, or a social gathering with friends and family)? Somewhat  Managing your time to do most of your daily activities? Somewhat  Learning new tasks or instructions? A lot   T-SCORE: 29.8; 2 SD below mean of 50  SLP further facilitated the session by gathering additional information thru Motivational Interviewing regarding difficulties that she is experiencing at work. Difficult to assess what components of job are difficult vs job expectations. Recommend pt ask her supervisors for additional training as a first step.       PATIENT EDUCATION: Education details: see above Person educated: Patient Education method: Medical illustrator Education comprehension: verbalized understanding and needs further education   HOME EXERCISE PROGRAM:   N/A   GOALS:  Goals reviewed with patient? Yes  SHORT TERM GOALS: Target date: 10 sessions  Patient will comprehend 4 pieces of information from a phone message, using repair skills independently including replaying the message.  Baseline: Goal status: INITIAL  2.  Patient will organize a work related  activity with min cues using organizational template designed by SLP. Baseline:  Goal status: INITIAL  3.  Pt will use strategies to improve memory for important information with 75% acc. with minimal assistance (ie., white board, daily planner/calendar, Apps on phone).  Baseline:  Goal status: INITIAL   LONG TERM GOALS: Target date: 04/12/2023  Pt will report improved cognitive communication via PROM by 5 points at last ST session Baseline:  Goal status: INITIAL  2.  To improve meta-cognitive awareness/executive functions, patient will a) recall at least one success and one challenge related to communication from the previous  week and b) identify at least one strategy to improve a similar challenge in the future across 3 consecutive sessions.  Baseline:  Goal status: INITIAL  ASSESSMENT:  CLINICAL IMPRESSION: Pt presents with mild cognitive impairment with moderate to severe deficits in memory (recall, working, procedural and prospective).   OBJECTIVE IMPAIRMENTS include memory. These impairments are limiting patient from return to work, managing medications, managing appointments, managing finances, household responsibilities, and ADLs/IADLs. Factors affecting potential to achieve goals and functional outcome are ability to learn/carryover information. Patient will benefit from skilled SLP services to address above impairments and improve overall function.  REHAB POTENTIAL: Excellent  PLAN: SLP FREQUENCY: 1-2x/week  SLP DURATION: 12 weeks  PLANNED INTERVENTIONS: Group dysphagia treatment, Environmental controls, Cognitive reorganization, Internal/external aids, Functional tasks, SLP instruction and feedback, Compensatory strategies, and Patient/family education   Glendale Wherry B. Dreama Saa, M.S., CCC-SLP, Tree surgeon Certified Brain Injury Specialist [facility]  [facility] Rehabilitation Services Office [phone] Ascom [phone] Fax [phone]

## 2023-01-25 NOTE — Progress Notes (Signed)
Subjective:    Patient ID: Heather [name], female    DOB: December 10, 1975, 46 y.o.   MRN: [id number]  Patient here for  Chief Complaint  Patient presents with   Annual Exam    HPI Here for f/u regarding asthma.  Also has had problems with "long covid".  Seeing neurology.  S/p MRI.  Referred to speech therapy - for cognitive training.  Discussed with her today.  She goes for her second treatment today.  Also saw neurology - pseudoseizures.  On depakote.  23 ambulatory EEG 07/27/22 - normal.  Ajovy for migraines.  Recommended continuing cpap.  Seeing Dr Cathie Hoops for f/u MGUS.  Discussed her concerns with work and feeling like things are not "sticking".  Has to re read things to try and comprehend.  No chest pain reported.  Recent flare with congestion - after being in the yard.  Overall the cough and congestion - appears to be better.  No abdominal ain reported.  Discussed labs 0 neurology labs.  Already taking vitamin D3 - large otc doses.  Discussed prescription vitamin D.  Also start B12 injections.    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 hauler    Depression    bipolar   Dysrhythmia    Fainting spell    H/O   H/O cardiac arrhythmia    Migraines    Seizures (HCC)    Past Surgical History:  Procedure Laterality Date   COLONOSCOPY WITH PROPOFOL N/A 06/03/2021   Procedure: COLONOSCOPY WITH PROPOFOL;  Surgeon: Pasty Spillers, MD;  Location: [facility] ENDOSCOPY;  Service: Endoscopy;  Laterality: N/A;   ESOPHAGOGASTRODUODENOSCOPY N/A 06/03/2021   Procedure: ESOPHAGOGASTRODUODENOSCOPY (EGD);  Surgeon: Pasty Spillers, MD;  Location: [facility] ENDOSCOPY;  Service: Endoscopy;  Laterality: N/A;   TUBAL LIGATION Bilateral 09/25/2002   tubal reversal     Family History  Problem Relation Age of Onset   Hypertension Mother    Diabetes Mother    Arthritis Mother    Heart disease Mother    Breast cancer Neg Hx    Social History   Socioeconomic History   Marital status:  Married    Spouse name: Not on file   Number of children: Not on file   Years of education: Not on file   Highest education level: Not on file  Occupational History   Not on file  Tobacco Use   Smoking status: Former    Packs/day: 0.50    Years: 5.00    Additional pack years: 0.00    Total pack years: 2.50    Types: Cigarettes    Quit date: 2013    Years since quitting: 11.3   Smokeless tobacco: Never   Tobacco comments:    Quit mid October 2014  Vaping Use   Vaping Use: Never used  Substance and Sexual Activity   Alcohol use: No    Alcohol/week: 0.0 standard drinks of alcohol   Drug use: No   Sexual activity: Yes    Birth control/protection: None  Other Topics Concern   Not on file  Social History Narrative   Not on file   Social Determinants of Health   Financial Resource Strain: Not on file  Food Insecurity: Not on file  Transportation Needs: Not on file  Physical Activity: Not on file  Stress: Not on file  Social Connections: Not on file     Review of Systems  Constitutional:  Negative for appetite change and  unexpected weight change.  HENT:  Negative for sinus pressure.        No increased congestion.   Respiratory:  Negative for chest tightness.        Some cough. Breathing has been overall stable.   Cardiovascular:  Negative for chest pain and palpitations.  Gastrointestinal:  Negative for abdominal pain, diarrhea, nausea and vomiting.  Genitourinary:  Negative for difficulty urinating and dysuria.  Musculoskeletal:  Negative for joint swelling and myalgias.  Skin:  Negative for color change and rash.  Neurological:  Negative for dizziness and headaches.  Psychiatric/Behavioral:  Negative for agitation and dysphoric mood.        Objective:     BP 130/78   Pulse 97   Temp 98 F (36.7 C)   Resp 16   Ht 5' 6.5" (1.689 m)   Wt 185 lb 12.8 oz (84.3 kg)   SpO2 98%   BMI 29.54 kg/m  Wt Readings from Last 3 Encounters:  01/25/23 185 lb 12.8 oz  (84.3 kg)  10/26/22 180 lb 3.2 oz (81.7 kg)  10/13/22 183 lb 1.6 oz (83.1 kg)    Physical Exam Vitals reviewed.  Constitutional:      General: She is not in acute distress.    Appearance: Normal appearance.  HENT:     Head: Normocephalic and atraumatic.     Right Ear: External ear normal.     Left Ear: External ear normal.  Eyes:     General: No scleral icterus.       Right eye: No discharge.        Left eye: No discharge.     Conjunctiva/sclera: Conjunctivae normal.  Neck:     Thyroid: No thyromegaly.  Cardiovascular:     Rate and Rhythm: Normal rate and regular rhythm.  Pulmonary:     Effort: No respiratory distress.     Breath sounds: Normal breath sounds. No wheezing.  Abdominal:     General: Bowel sounds are normal.     Palpations: Abdomen is soft.     Tenderness: There is no abdominal tenderness.  Musculoskeletal:        General: No swelling or tenderness.     Cervical back: Neck supple. No tenderness.  Lymphadenopathy:     Cervical: No cervical adenopathy.  Skin:    Findings: No erythema or rash.  Neurological:     Mental Status: [name].  Psychiatric:        Mood and Affect: Mood normal.        Behavior: Behavior normal.      Outpatient Encounter Medications as of 01/25/2023  Medication Sig   Vitamin D, Ergocalciferol, (DRISDOL) 1.25 MG (50000 UNIT) CAPS capsule Take 1 capsule (50,000 Units total) by mouth every 7 (seven) days.   Acetylcysteine 600 MG TBCR Take by mouth.   albuterol (VENTOLIN HFA) 108 (90 Base) MCG/ACT inhaler Inhale 2 puffs into the lungs every 6 (six) hours as needed for wheezing or shortness of breath.   amphetamine-dextroamphetamine (ADDERALL XR) 20 MG 24 hr capsule Take 20 mg by mouth every morning.   benzonatate (TESSALON PERLES) 100 MG capsule Take 1 capsule (100 mg total) by mouth 3 (three) times daily as needed for cough.   Budeson-Glycopyrrol-Formoterol (BREZTRI AEROSPHERE) 160-9-4.8 MCG/ACT AERO Inhale 2 puffs into the lungs  in the morning and at bedtime.   cetirizine (ZYRTEC) 10 MG tablet Take 1 tablet by mouth once daily   diazepam (VALIUM) 10 MG tablet Take 1 tablet by  mouth 4 (four) times daily as needed.   divalproex (DEPAKOTE) 500 MG DR tablet Take by mouth.   fluticasone (FLONASE) 50 MCG/ACT nasal spray Place 1 spray into both nostrils daily.   ipratropium-albuterol (DUONEB) 0.5-2.5 (3) MG/3ML SOLN Inhale 3 mLs into the lungs every 4 (four) hours as needed.   magnesium oxide (MAG-OX) 400 MG tablet Take 1 tablet (400 mg total) by mouth daily.   omeprazole (PRILOSEC) 40 MG capsule Take 1 capsule (40 mg total) by mouth daily before breakfast. (Patient not taking: Reported on 10/13/2022)   ondansetron (ZOFRAN-ODT) 4 MG disintegrating tablet Take 1 tablet (4 mg total) by mouth 2 (two) times daily as needed for nausea or vomiting.   [DISCONTINUED] predniSONE (DELTASONE) 10 MG tablet Take 6 tablets x 1 day and then decrease by 1/2 tablet per day until down to zero mg.   No facility-administered encounter medications on file as of 01/25/2023.     Lab Results  Component Value Date   WBC 15.0 (H) 08/04/2022   HGB 13.6 08/04/2022   HCT 40.4 08/04/2022   PLT 301 08/04/2022   GLUCOSE 92 07/19/2022   CHOL 207 (H) 07/10/2022   TRIG 350.0 (H) 07/10/2022   HDL 36.60 (L) 07/10/2022   LDLDIRECT 136.0 07/10/2022   LDLCALC 134 (H) 08/29/2021   ALT 13 07/10/2022   AST 9 07/10/2022   NA 136 07/19/2022   K 3.6 07/19/2022   CL 102 07/19/2022   CREATININE 0.69 07/19/2022   BUN 12 07/19/2022   CO2 25 07/19/2022   TSH 1.47 08/29/2021    DG Chest 2 View  Result Date: 10/13/2022 CLINICAL DATA:  cough EXAM: CHEST - 2 VIEW COMPARISON:  05/19/2022 FINDINGS: Cardiac silhouette is unremarkable. No pneumothorax or pleural effusion. The lungs are clear. The visualized skeletal structures are unremarkable. IMPRESSION: No acute cardiopulmonary process. Electronically Signed   By: Layla Maw M.D.   On: 10/13/2022 23:23        Assessment & Plan:  Abnormal cervical Papanicolaou smear, unspecified abnormal pap finding Assessment & Plan: Saw Dr Logan Bores 02/2022 - recommended f/u pap in one year.    Attention deficit hyperactivity disorder (ADHD), unspecified ADHD type Assessment & Plan: Evaluation [facility] 02/09/22 - Gregery Na)   Anxiety Assessment & Plan: Followed by psychiatry.    Moderate persistent asthma with acute exacerbation Assessment & Plan: Cough and breathing overall better.  Follow for allergy triggers.  Trelegy.     Constipation, unspecified constipation type Assessment & Plan: Being followed by Dr Allegra Lai.  Off linzess.  Previously recommended trial of xifaxan.    Gastroesophageal reflux disease, unspecified whether esophagitis present Assessment & Plan: Upper symptoms appear to be controlled.    Hypercholesterolemia Assessment & Plan: Low cholesterol diet and exercise.  Follow lipid panel.    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  Followed by Dr Cathie Hoops.  Monitoring.  (Following SPEP)   Seizure disorder [facility]) Assessment & Plan: Being followed by neurology.     Vitamin D deficiency Assessment & Plan: Start prescription vitamin D 50,000 units per week given persistent low vitamin  D level.  Follow.    B12 deficiency Assessment & Plan: Start B12 injections as outlined.  q week x 4 weeks and then q month.    Other orders -  Vitamin D (Ergocalciferol); Take 1 capsule (50,000 Units total) by mouth every 7 (seven) days.  Dispense: 12 capsule; Refill: 0     [place], MD

## 2023-01-26 ENCOUNTER — Ambulatory Visit: Payer: BC Managed Care – PPO | Admitting: Speech Pathology

## 2023-01-26 DIAGNOSIS — R4189 Other symptoms and signs involving cognitive functions and awareness: Secondary | ICD-10-CM

## 2023-01-26 DIAGNOSIS — R41841 Cognitive communication deficit: Secondary | ICD-10-CM

## 2023-01-28 ENCOUNTER — Encounter: Payer: Self-pay | Admitting: Internal Medicine

## 2023-01-28 DIAGNOSIS — E538 Deficiency of other specified B group vitamins: Secondary | ICD-10-CM | POA: Insufficient documentation

## 2023-01-28 NOTE — Assessment & Plan Note (Signed)
Being followed by Dr Allegra Lai.  Off linzess.  Previously recommended trial of xifaxan.

## 2023-01-28 NOTE — Assessment & Plan Note (Signed)
Evaluation [facility] 02/09/22 - Gregery Na)

## 2023-01-28 NOTE — Assessment & Plan Note (Signed)
Cough and breathing overall better.  Follow for allergy triggers.  Trelegy.

## 2023-01-28 NOTE — Assessment & Plan Note (Signed)
Low cholesterol diet and exercise.  Follow lipid panel.   

## 2023-01-28 NOTE — Assessment & Plan Note (Signed)
Saw Dr Evans 02/2022 - recommended f/u pap in one year.  

## 2023-01-28 NOTE — Assessment & Plan Note (Signed)
Start B12 injections as outlined.  q week x 4 weeks and then q month.

## 2023-01-28 NOTE — Assessment & Plan Note (Signed)
IBS - diarrhea - Dr Vanga (06/2022) - Trial of Xifaxan  

## 2023-01-28 NOTE — Assessment & Plan Note (Signed)
Being followed by neurology.   

## 2023-01-28 NOTE — Assessment & Plan Note (Signed)
Followed by psychiatry 

## 2023-01-28 NOTE — Assessment & Plan Note (Signed)
Upper symptoms appear to be controlled.

## 2023-01-28 NOTE — Assessment & Plan Note (Signed)
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  Followed by Dr Yu.  Monitoring.  (Following SPEP) 

## 2023-01-28 NOTE — Assessment & Plan Note (Signed)
Start prescription vitamin D 50,000 units per week given persistent low vitamin  D level.  Follow.

## 2023-01-29 ENCOUNTER — Telehealth: Payer: Self-pay | Admitting: Internal Medicine

## 2023-01-29 NOTE — Therapy (Signed)
OUTPATIENT SPEECH LANGUAGE PATHOLOGY  TREATMENT NOTE   Patient Name: Heather [name] MRN: [id number] DOB:June 10, 1976, 47 y.o., female Today's Date: 5/3/20248  PCP: Dale Chataignier, MD REFERRING PROVIDER: Cristopher Peru, MD   End of Session - 01/26/23 0950     Visit Number 3    Number of Visits 25    Date for SLP Re-Evaluation 04/12/23    Authorization Type [employer] PPO    Progress Note Due on Visit 10    SLP Start Time 0800    SLP Stop Time  0900    SLP Time Calculation (min) 60 min    Activity Tolerance Other (comment)   see treatment note            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 hauler    Depression    bipolar   Dysrhythmia    Fainting spell    H/O   H/O cardiac arrhythmia    Migraines    Seizures (HCC)    Past Surgical History:  Procedure Laterality Date   COLONOSCOPY WITH PROPOFOL N/A 06/03/2021   Procedure: COLONOSCOPY WITH PROPOFOL;  Surgeon: Pasty Spillers, MD;  Location: [facility] ENDOSCOPY;  Service: Endoscopy;  Laterality: N/A;   ESOPHAGOGASTRODUODENOSCOPY N/A 06/03/2021   Procedure: ESOPHAGOGASTRODUODENOSCOPY (EGD);  Surgeon: Pasty Spillers, MD;  Location: [facility] ENDOSCOPY;  Service: Endoscopy;  Laterality: N/A;   TUBAL LIGATION Bilateral 09/25/2002   tubal reversal     Patient Active Problem List   Diagnosis Date Noted   B12 deficiency 01/28/2023   Leg pain 07/19/2022   ADHD 06/30/2022   Irritable bowel syndrome (IBS) 06/29/2022   Abnormal Pap smear of cervix 03/15/2022   Constipation 01/05/2022   Non-intractable vomiting    Hiatal hernia    Retained food in stomach    Special screening for malignant neoplasms, colon    Rectal polyp    Asthmatic bronchitis with acute exacerbation 05/27/2021   Pre-op evaluation 05/24/2021   Anxiety 02/27/2021   Cough 02/25/2021   Vitamin D deficiency 02/04/2021   Fatigue 09/29/2020   Nausea 09/12/2020   Encounter for completion of form with patient  07/18/2020   History of COVID-19 06/11/2020   Bronchitis 09/22/2019   Sleep disturbance 03/16/2019   Herpes zoster without complication 01/29/2019   History of seizure 01/22/2019   GERD (gastroesophageal reflux disease) 01/05/2019   Back pain 12/21/2018   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   Abdominal pain 11/19/2018   Conjunctivitis 05/23/2018   Sleeping difficulty 10/31/2017   Rash 06/11/2017   Health care maintenance 10/12/2016   History of miscarriage 10/12/2016   Hypercholesterolemia 07/09/2016   Migraine 07/20/2013   Seizure disorder (HCC) 07/20/2013   Asthma 07/20/2013   Alternating constipation and diarrhea 07/20/2013   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    ONSET DATE: September 2022 (initial dx of COVID);  12/19/2021 (date of referral)  REFERRING DIAG: R41.89 (ICD-10-CM) - Cognitive impairment   THERAPY DIAG:  Cognitive communication deficit  Cognitive impairment  Rationale for Evaluation and Treatment Rehabilitation  SUBJECTIVE:   SUBJECTIVE STATEMENT: "I met with my PCP after our session yesterday so I asked my mom to come with me today." Pt accompanied by: self; pt's mom  PERTINENT HISTORY: Pt is a 47 year old female with long COVID in an unvaccinated person (05/2021) who has history of fatigue, short term memory concerns, pseudoseizures (23 hour ambulatory EEG on 07/27/2022 was normal), migraine without  aura (recently worsened d/t pt being out of medicine), obstructive sleep apnea on CPAP, and bipolar affective disorder (depressed with moderate anxiety).   DIAGNOSTIC FINDINGS:  MRI brain with and without contrast 05/20/2022: IMPRESSION:  Mildly limited study due to susceptibility artifact. Symmetric hippocampi. Diffuse mild volume loss.   PAIN:  Are you having pain? No   FALLS: Has patient fallen in last 6 months?  No  LIVING ENVIRONMENT: Lives with: lives with their spouse Lives in: House/apartment  PLOF:   Level of assistance: Independent with ADLs, Independent with IADLs Employment: Full-time employment; works from home   PATIENT GOALS   to improve cognitive communication abilites  OBJECTIVE:   TODAY'S TREATMENT:  Skilled treatment session focused on pt's cognitive communication goals. SLP facilitated session by providing the following interventions:   Pt's mother attended session today to provide additional information on pt's overall cognitive deficits and their impact on daily living. Pt voiced frustration over previous session as SLP recommendations were ones that had already been attempted during her initial training for work.    SLP further facilitiated session by providing instruction in evidence-based memory technique of SPACED RETRIEVAL TRAINING (SRT). SRT uses procedural memory to help paitnet recall information over progressively longer intervals of time.   With maximal assistance, pt able to identify a functional memory target - the day, date and time of her upcoming appt with her PCP. Initially pt unable to recall time of appt after 1 minute delay. When engaging in SRT, pt able to recall time of appt after 20 minute interval. This was significant improvement.   Pt benefited from maximal multimodal education on therapeutic targets as well as therapeutic process of gradual impact/change over course of multiple sessions.   PATIENT EDUCATION: Education details: see above Person educated: Patient, pt's mother Education method: Medical illustrator Education comprehension: verbalized understanding and needs further education   HOME EXERCISE PROGRAM:   N/A   GOALS:  Goals reviewed with patient? Yes  SHORT TERM GOALS: Target date: 10 sessions  Patient will comprehend 4 pieces of information from a phone message, using repair skills independently including replaying the message.  Baseline: Goal status: INITIAL  2.  Patient will organize a work related activity with  min cues using organizational template designed by SLP. Baseline:  Goal status: INITIAL  3.  Pt will use strategies to improve memory for important information with 75% acc. with minimal assistance (ie., white board, daily planner/calendar, Apps on phone).  Baseline:  Goal status: INITIAL   LONG TERM GOALS: Target date: 04/12/2023  Pt will report improved cognitive communication via PROM by 5 points at last ST session Baseline:  Goal status: INITIAL  2.  To improve meta-cognitive awareness/executive functions, patient will a) recall at least one success and one challenge related to communication from the previous week and b) identify at least one strategy to improve a similar challenge in the future across 3 consecutive sessions.  Baseline:  Goal status: INITIAL  ASSESSMENT:  CLINICAL IMPRESSION: Pt presents with mild cognitive impairment with moderate to severe deficits in memory (recall, working, procedural and prospective). Pt's ability to recall information after a delay was improved using SRT. Over the course of the session, pt was intermittently emotional with some manifestations of decreased mental flexibility. Support and education provided.   OBJECTIVE IMPAIRMENTS include memory. These impairments are limiting patient from return to work, managing medications, managing appointments, managing finances, household responsibilities, and ADLs/IADLs. Factors affecting potential to achieve goals and functional outcome are  ability to learn/carryover information. Patient will benefit from skilled SLP services to address above impairments and improve overall function.  REHAB POTENTIAL: Excellent  PLAN: SLP FREQUENCY: 1-2x/week  SLP DURATION: 12 weeks  PLANNED INTERVENTIONS: Group dysphagia treatment, Environmental controls, Cognitive reorganization, Internal/external aids, Functional tasks, SLP instruction and feedback, Compensatory strategies, and Patient/family  education   Javin Nong B. Dreama Saa, M.S., CCC-SLP, Tree surgeon Certified Brain Injury Specialist [facility]  [facility] Rehabilitation Services Office [phone] Ascom [phone] Fax [phone]

## 2023-01-29 NOTE — Telephone Encounter (Signed)
Mychart sent to patient.

## 2023-01-29 NOTE — Telephone Encounter (Signed)
Received notification - overdue mammogram.  Need to schedule.  Thanks   

## 2023-02-01 ENCOUNTER — Ambulatory Visit (INDEPENDENT_AMBULATORY_CARE_PROVIDER_SITE_OTHER): Payer: BC Managed Care – PPO

## 2023-02-01 ENCOUNTER — Ambulatory Visit: Payer: BC Managed Care – PPO | Admitting: Speech Pathology

## 2023-02-01 DIAGNOSIS — E538 Deficiency of other specified B group vitamins: Secondary | ICD-10-CM

## 2023-02-01 DIAGNOSIS — R4189 Other symptoms and signs involving cognitive functions and awareness: Secondary | ICD-10-CM

## 2023-02-01 DIAGNOSIS — R41841 Cognitive communication deficit: Secondary | ICD-10-CM

## 2023-02-01 MED ORDER — CYANOCOBALAMIN 1000 MCG/ML IJ SOLN
1000.0000 ug | Freq: Once | INTRAMUSCULAR | Status: AC
Start: 2023-02-01 — End: 2023-02-01
  Administered 2023-02-01: 1000 ug via INTRAMUSCULAR

## 2023-02-01 NOTE — Therapy (Signed)
OUTPATIENT SPEECH LANGUAGE PATHOLOGY  TREATMENT NOTE   Patient Name: Heather [name] MRN: [id number] DOB:04/17/1976, 47 y.o., female Today's Date: 02/01/2023   PCP: Dale Tecumseh, MD REFERRING PROVIDER: Cristopher Peru, MD  END of SESSION  End of Session - 02/01/23 1611     Visit Number 4    Number of Visits 25    Date for SLP Re-Evaluation 04/12/23    Authorization Type [employer] PPO    Progress Note Due on Visit 10    SLP Start Time 1100    SLP Stop Time  1200    SLP Time Calculation (min) 60 min    Activity Tolerance Patient tolerated treatment well            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 hauler    Depression    bipolar   Dysrhythmia    Fainting spell    H/O   H/O cardiac arrhythmia    Migraines    Seizures (HCC)    Past Surgical History:  Procedure Laterality Date   COLONOSCOPY WITH PROPOFOL N/A 06/03/2021   Procedure: COLONOSCOPY WITH PROPOFOL;  Surgeon: Pasty Spillers, MD;  Location: [facility] ENDOSCOPY;  Service: Endoscopy;  Laterality: N/A;   ESOPHAGOGASTRODUODENOSCOPY N/A 06/03/2021   Procedure: ESOPHAGOGASTRODUODENOSCOPY (EGD);  Surgeon: Pasty Spillers, MD;  Location: [facility] ENDOSCOPY;  Service: Endoscopy;  Laterality: N/A;   TUBAL LIGATION Bilateral 09/25/2002   tubal reversal     Patient Active Problem List   Diagnosis Date Noted   B12 deficiency 01/28/2023   Leg pain 07/19/2022   ADHD 06/30/2022   Irritable bowel syndrome (IBS) 06/29/2022   Abnormal Pap smear of cervix 03/15/2022   Constipation 01/05/2022   Non-intractable vomiting    Hiatal hernia    Retained food in stomach    Special screening for malignant neoplasms, colon    Rectal polyp    Asthmatic bronchitis with acute exacerbation 05/27/2021   Pre-op evaluation 05/24/2021   Anxiety 02/27/2021   Cough 02/25/2021   Vitamin D deficiency 02/04/2021   Fatigue 09/29/2020   Nausea 09/12/2020   Encounter for completion of form with  patient 07/18/2020   History of COVID-19 06/11/2020   Bronchitis 09/22/2019   Sleep disturbance 03/16/2019   Herpes zoster without complication 01/29/2019   History of seizure 01/22/2019   GERD (gastroesophageal reflux disease) 01/05/2019   Back pain 12/21/2018   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   Abdominal pain 11/19/2018   Conjunctivitis 05/23/2018   Sleeping difficulty 10/31/2017   Rash 06/11/2017   Health care maintenance 10/12/2016   History of miscarriage 10/12/2016   Hypercholesterolemia 07/09/2016   Migraine 07/20/2013   Seizure disorder (HCC) 07/20/2013   Asthma 07/20/2013   Alternating constipation and diarrhea 07/20/2013   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    ONSET DATE: September 2022 (initial dx of COVID);  12/19/2021 (date of referral)  REFERRING DIAG: R41.89 (ICD-10-CM) - Cognitive impairment   THERAPY DIAG:  Cognitive communication deficit  Cognitive impairment  Rationale for Evaluation and Treatment Rehabilitation  SUBJECTIVE:   SUBJECTIVE STATEMENT: Pt subjectively appeared in a good mood Pt accompanied by: self;   PERTINENT HISTORY: Pt is a 47 year old female with 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   DIAGNOSTIC FINDINGS:  MRI brain with and without contrast 05/20/2022: IMPRESSION:  Mildly limited study due to susceptibility artifact. Symmetric hippocampi. Diffuse mild volume loss.   PAIN:  Are you having pain? No   FALLS: Has patient fallen in last 6 months?  No  LIVING ENVIRONMENT: Lives with: lives with their spouse Lives in: House/apartment  PLOF:  Level of assistance: Independent with ADLs,  Independent with IADLs Employment: Full-time employment; works from home   PATIENT GOALS   to improve cognitive communication abilites  OBJECTIVE:   TODAY'S TREATMENT:  Skilled treatment session focused on pt's cognitive communication goals. SLP facilitated session by providing the following interventions:  Pt provided with skilled verbal and written information ine external and internal memory aids as well as various ways to utilize apps on her cell phone to promote accurate recall of information. Pt returned demonstration of use of apps and appears to be utilizing multi-organizational strategies.  Education provided on functional goals for memory including use of compensatory memory aids.   PATIENT EDUCATION: Education details: see above Person educated: Patient, pt's mother Education method: Medical illustrator Education comprehension: verbalized understanding and needs further education   HOME EXERCISE PROGRAM:   N/A   GOALS:  Goals reviewed with patient? Yes  SHORT TERM GOALS: Target date: 10 sessions  Patient will comprehend 4 pieces of information from a phone message, using repair skills independently including replaying the message.  Baseline: Goal status: INITIAL  2.  Patient will organize a work related activity with min cues using organizational template designed by SLP. Baseline:  Goal status: INITIAL  3.  Pt will use strategies to improve memory for important information with 75% acc. with minimal assistance (ie., white board, daily planner/calendar, Apps on phone).  Baseline:  Goal status: INITIAL   LONG TERM GOALS: Target date: 04/12/2023  Pt will report improved cognitive communication via PROM by 5 points at last ST session Baseline:  Goal status: INITIAL  2.  To improve meta-cognitive awareness/executive functions, patient will a) recall at least one success and one challenge related to communication from the previous week and b)  identify at least one strategy to improve a similar challenge in the future across 3 consecutive sessions.  Baseline:  Goal status: INITIAL  ASSESSMENT:  CLINICAL IMPRESSION: Pt presents with mild cognitive impairment with moderate to severe deficits in memory (recall, working, procedural and prospective).    OBJECTIVE IMPAIRMENTS include memory. These impairments are limiting patient from return to work, managing medications, managing appointments, managing finances, household responsibilities, and ADLs/IADLs. Factors affecting potential to achieve goals and functional outcome are ability to learn/carryover information. Patient will benefit from skilled SLP services to address above impairments and improve overall function.  REHAB POTENTIAL: Excellent  PLAN: SLP FREQUENCY: 1-2x/week  SLP DURATION: 12 weeks  PLANNED INTERVENTIONS: Group dysphagia treatment, Environmental controls, Cognitive reorganization, Internal/external aids, Functional tasks, SLP instruction and feedback, Compensatory strategies, and Patient/family education   Lanissa Cashen B. Dreama Saa, M.S., CCC-SLP, Tree surgeon Certified Brain Injury Specialist [facility]  [facility] Rehabilitation Services Office [phone] Ascom [phone] Fax [phone]

## 2023-02-01 NOTE — Progress Notes (Signed)
Patient presented for B 12 injection to left deltoid, patient voiced no concerns nor showed any signs of distress during injection. 

## 2023-02-02 ENCOUNTER — Ambulatory Visit: Payer: BC Managed Care – PPO | Admitting: Speech Pathology

## 2023-02-08 ENCOUNTER — Inpatient Hospital Stay: Payer: BC Managed Care – PPO | Attending: Oncology

## 2023-02-08 ENCOUNTER — Ambulatory Visit (INDEPENDENT_AMBULATORY_CARE_PROVIDER_SITE_OTHER): Payer: BC Managed Care – PPO

## 2023-02-08 ENCOUNTER — Ambulatory Visit: Payer: BC Managed Care – PPO | Admitting: Speech Pathology

## 2023-02-08 DIAGNOSIS — G40909 Epilepsy, unspecified, not intractable, without status epilepticus: Secondary | ICD-10-CM | POA: Insufficient documentation

## 2023-02-08 DIAGNOSIS — G8929 Other chronic pain: Secondary | ICD-10-CM | POA: Diagnosis not present

## 2023-02-08 DIAGNOSIS — D472 Monoclonal gammopathy: Secondary | ICD-10-CM | POA: Diagnosis not present

## 2023-02-08 DIAGNOSIS — Z87891 Personal history of nicotine dependence: Secondary | ICD-10-CM | POA: Diagnosis not present

## 2023-02-08 DIAGNOSIS — E538 Deficiency of other specified B group vitamins: Secondary | ICD-10-CM

## 2023-02-08 DIAGNOSIS — R41841 Cognitive communication deficit: Secondary | ICD-10-CM

## 2023-02-08 DIAGNOSIS — J45909 Unspecified asthma, uncomplicated: Secondary | ICD-10-CM | POA: Diagnosis not present

## 2023-02-08 DIAGNOSIS — D72829 Elevated white blood cell count, unspecified: Secondary | ICD-10-CM | POA: Diagnosis not present

## 2023-02-08 DIAGNOSIS — Z79899 Other long term (current) drug therapy: Secondary | ICD-10-CM | POA: Diagnosis not present

## 2023-02-08 DIAGNOSIS — R4189 Other symptoms and signs involving cognitive functions and awareness: Secondary | ICD-10-CM

## 2023-02-08 LAB — COMPREHENSIVE METABOLIC PANEL
ALT: 16 U/L (ref 0–44)
AST: 16 U/L (ref 15–41)
Albumin: 3.8 g/dL (ref 3.5–5.0)
Alkaline Phosphatase: 110 U/L (ref 38–126)
Anion gap: 10 (ref 5–15)
BUN: 7 mg/dL (ref 6–20)
CO2: 24 mmol/L (ref 22–32)
Calcium: 9 mg/dL (ref 8.9–10.3)
Chloride: 104 mmol/L (ref 98–111)
Creatinine, Ser: 0.75 mg/dL (ref 0.44–1.00)
GFR, Estimated: >60 mL/min (ref >60)
Glucose, Bld: 96 mg/dL (ref 70–99)
Potassium: 3.8 mmol/L (ref 3.5–5.1)
Sodium: 138 mmol/L (ref 135–145)
Total Bilirubin: 0.3 mg/dL (ref 0.3–1.2)
Total Protein: 6.6 g/dL (ref 6.5–8.1)

## 2023-02-08 LAB — CBC WITH DIFFERENTIAL/PLATELET
Abs Immature Granulocytes: 0.06 10*3/uL (ref 0.00–0.07)
Basophils Absolute: 0.1 10*3/uL (ref 0.0–0.1)
Basophils Relative: 1 %
Eosinophils Absolute: 0.3 10*3/uL (ref 0.0–0.5)
Eosinophils Relative: 2 %
HCT: 40.1 % (ref 36.0–46.0)
Hemoglobin: 13.1 g/dL (ref 12.0–15.0)
Immature Granulocytes: 0 %
Lymphocytes Relative: 20 %
Lymphs Abs: 3.1 10*3/uL (ref 0.7–4.0)
MCH: 27.6 pg (ref 26.0–34.0)
MCHC: 32.7 g/dL (ref 30.0–36.0)
MCV: 84.4 fL (ref 80.0–100.0)
Monocytes Absolute: 0.9 10*3/uL (ref 0.1–1.0)
Monocytes Relative: 5 %
Neutro Abs: 11.4 10*3/uL — ABNORMAL HIGH (ref 1.7–7.7)
Neutrophils Relative %: 72 %
Platelets: 353 10*3/uL (ref 150–400)
RBC: 4.75 MIL/uL (ref 3.87–5.11)
RDW: 13.5 % (ref 11.5–15.5)
WBC: 15.8 10*3/uL — ABNORMAL HIGH (ref 4.0–10.5)
nRBC: 0 % (ref 0.0–0.2)

## 2023-02-08 MED ORDER — CYANOCOBALAMIN 1000 MCG/ML IJ SOLN
1000.0000 ug | Freq: Once | INTRAMUSCULAR | Status: AC
Start: 2023-02-08 — End: 2023-02-08
  Administered 2023-02-08: 1000 ug via INTRAMUSCULAR

## 2023-02-08 NOTE — Progress Notes (Signed)
Patient arrived for a B12 injection and it was administered into her Right deltoid. Patient tolerated the injection well and did not show any signs of distress or voice any concerns. 

## 2023-02-08 NOTE — Therapy (Signed)
OUTPATIENT SPEECH LANGUAGE PATHOLOGY  TREATMENT NOTE   Patient Name: Heather [name] MRN: [id number] DOB:[birth year], 47 y.o., female Today's Date: 02/08/2023   PCP: Dale De Baca, MD REFERRING PROVIDER: Cristopher Peru, MD  END of SESSION  End of Session - 02/08/23 0907     Visit Number 5    Number of Visits 25    Date for SLP Re-Evaluation 04/12/23    Authorization Type [employer] PPO    Progress Note Due on Visit 10    SLP Start Time 0900    SLP Stop Time  1000    SLP Time Calculation (min) 60 min    Activity Tolerance Patient tolerated treatment well            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 hauler    Depression    bipolar   Dysrhythmia    Fainting spell    H/O   H/O cardiac arrhythmia    Migraines    Seizures (HCC)    Past Surgical History:  Procedure Laterality Date   COLONOSCOPY WITH PROPOFOL N/A 06/03/2021   Procedure: COLONOSCOPY WITH PROPOFOL;  Surgeon: Pasty Spillers, MD;  Location: [facility] ENDOSCOPY;  Service: Endoscopy;  Laterality: N/A;   ESOPHAGOGASTRODUODENOSCOPY N/A 06/03/2021   Procedure: ESOPHAGOGASTRODUODENOSCOPY (EGD);  Surgeon: Pasty Spillers, MD;  Location: [facility] ENDOSCOPY;  Service: Endoscopy;  Laterality: N/A;   TUBAL LIGATION Bilateral 09/25/2002   tubal reversal     Patient Active Problem List   Diagnosis Date Noted   B12 deficiency 01/28/2023   Leg pain 07/19/2022   ADHD 06/30/2022   Irritable bowel syndrome (IBS) 06/29/2022   Abnormal Pap smear of cervix 03/15/2022   Constipation 01/05/2022   Non-intractable vomiting    Hiatal hernia    Retained food in stomach    Special screening for malignant neoplasms, colon    Rectal polyp    Asthmatic bronchitis with acute exacerbation 05/27/2021   Pre-op evaluation 05/24/2021   Anxiety 02/27/2021   Cough 02/25/2021   Vitamin D deficiency 02/04/2021   Fatigue 09/29/2020   Nausea 09/12/2020   Encounter for completion of form with  patient 07/18/2020   History of COVID-19 06/11/2020   Bronchitis 09/22/2019   Sleep disturbance 03/16/2019   Herpes zoster without complication 01/29/2019   History of seizure 01/22/2019   GERD (gastroesophageal reflux disease) 01/05/2019   Back pain 12/21/2018   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   Abdominal pain 11/19/2018   Conjunctivitis 05/23/2018   Sleeping difficulty 10/31/2017   Rash 06/11/2017   Health care maintenance 10/12/2016   History of miscarriage 10/12/2016   Hypercholesterolemia 07/09/2016   Migraine 07/20/2013   Seizure disorder (HCC) 07/20/2013   Asthma 07/20/2013   Alternating constipation and diarrhea 07/20/2013   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    ONSET DATE: September 2022 (initial dx of COVID);  12/19/2021 (date of referral)  REFERRING DIAG: R41.89 (ICD-10-CM) - Cognitive impairment   THERAPY DIAG:  Cognitive communication deficit  Cognitive impairment  Rationale for Evaluation and Treatment Rehabilitation  SUBJECTIVE:   SUBJECTIVE STATEMENT: Pt subjectively appeared in a good mood Pt accompanied by: self;   PERTINENT HISTORY: Pt is a 47 year old female with 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   DIAGNOSTIC FINDINGS:  MRI brain with and without contrast 05/20/2022: IMPRESSION:  Mildly limited study due to susceptibility artifact. Symmetric hippocampi. Diffuse mild volume loss.   PAIN:  Are you having pain? No   FALLS: Has patient fallen in last 6 months?  No  LIVING ENVIRONMENT: Lives with: lives with their family and lives with their spouse Lives in: House/apartment  PLOF:  Level of assistance:  Independent with ADLs, Independent with IADLs Employment: Full-time employment; works from home   PATIENT GOALS   to improve cognitive communication abilites  OBJECTIVE:   TODAY'S TREATMENT:  Skilled treatment session focused on pt's cognitive communication goals. SLP facilitated session by providing the following interventions:  Memory: Brain Yoga App utilized to target immediate memory -  Level 4 - Min A to achieve > 95% accuracy Level 10 - rare Min A and more than a reasonable amount of time to achieve > 95% accuracy TalkPath Therapy App utilized to target recall of auditory information Listen to and remember the sentence Level 3 - more than a reasonable amount of time to achieve >90% accuracy    The HVLT-R is a list learning test, which consists of 12 nouns within three semantic groups. The test has three learning trials in which the administrator reads the words aloud and then asks the patient to repeat as many as he/she can remember in any order. The three learning trials are used to calculate a Total Recall Score and Recognition Discrimination Index (RDI).  Immediate Recall: Trial 1: 4 (mean 5.8 / SD: 1.9)   Trial 2: 4 (mean 8.3 / SD: 2) Trial 3: 5 (mean 9.4 / SD: 2) Total Recall Score:  13 (mean 23.4/ SD 5.2)  Delayed Recognition True positive responses: 6  False-positive errors: 0  Recognition Discrimination Index (RDI): 6 (mean 11)  Pt's performance was below the average range        PATIENT EDUCATION: Education details: see above Person educated: Patient,  Education method: Medical illustrator Education comprehension: verbalized understanding and needs further education   HOME EXERCISE PROGRAM:   N/A   GOALS:  Goals reviewed with patient? Yes  SHORT TERM GOALS: Target date: 10 sessions  Patient will comprehend 4 pieces of information from a phone message, using repair skills independently including replaying the message.  Baseline: Goal  status: INITIAL  2.  Patient will organize a work related activity with min cues using organizational template designed by SLP. Baseline:  Goal status: INITIAL  3.  Pt will use strategies to improve memory for important information with 75% acc. with minimal assistance (ie., white board, daily planner/calendar, Apps on phone).  Baseline:  Goal status: INITIAL   LONG TERM GOALS: Target date: 04/12/2023  Pt will report improved cognitive communication via PROM by 5 points at last ST session Baseline:  Goal status: INITIAL  2.  To improve meta-cognitive awareness/executive functions, patient will a) recall at least one success and one challenge related to communication from the previous week and b) identify at least one strategy to improve a similar challenge in the future across 3 consecutive sessions.  Baseline:  Goal status: INITIAL  ASSESSMENT:  CLINICAL IMPRESSION: Pt presents with mild cognitive impairment with moderate to severe deficits in memory (recall, working, procedural and prospective). Pt appears proficient with use of external memory aids thus will target improved internal memory.    OBJECTIVE IMPAIRMENTS include memory. These impairments are limiting patient from  return to work, managing medications, managing appointments, managing finances, household responsibilities, and ADLs/IADLs. Factors affecting potential to achieve goals and functional outcome are ability to learn/carryover information. Patient will benefit from skilled SLP services to address above impairments and improve overall function.  REHAB POTENTIAL: Excellent  PLAN: SLP FREQUENCY: 1-2x/week  SLP DURATION: 12 weeks  PLANNED INTERVENTIONS: Cognitive reorganization, Internal/external aids, Functional tasks, SLP instruction and feedback, Compensatory strategies, and Patient/family education   Hajira Verhagen B. Dreama Saa, M.S., CCC-SLP, Tree surgeon Certified Brain Injury Specialist [facility]  [facility] Rehabilitation Services Office [phone] Ascom [phone] Fax [phone]

## 2023-02-09 LAB — KAPPA/LAMBDA LIGHT CHAINS
Kappa free light chain: 10.5 mg/L (ref 3.3–19.4)
Kappa, lambda light chain ratio: 1.24 (ref 0.26–1.65)
Lambda free light chains: 8.5 mg/L (ref 5.7–26.3)

## 2023-02-10 LAB — MISC LABCORP TEST (SEND OUT): Labcorp test code: 14300

## 2023-02-13 LAB — MISC LABCORP TEST (SEND OUT)

## 2023-02-15 ENCOUNTER — Ambulatory Visit (INDEPENDENT_AMBULATORY_CARE_PROVIDER_SITE_OTHER): Payer: BC Managed Care – PPO

## 2023-02-15 ENCOUNTER — Ambulatory Visit: Payer: BC Managed Care – PPO | Admitting: Speech Pathology

## 2023-02-15 ENCOUNTER — Ambulatory Visit: Payer: BC Managed Care – PPO

## 2023-02-15 DIAGNOSIS — E538 Deficiency of other specified B group vitamins: Secondary | ICD-10-CM | POA: Diagnosis not present

## 2023-02-15 DIAGNOSIS — R41841 Cognitive communication deficit: Secondary | ICD-10-CM

## 2023-02-15 DIAGNOSIS — R4189 Other symptoms and signs involving cognitive functions and awareness: Secondary | ICD-10-CM | POA: Diagnosis not present

## 2023-02-15 MED ORDER — CYANOCOBALAMIN 1000 MCG/ML IJ SOLN
1000.0000 ug | Freq: Once | INTRAMUSCULAR | Status: AC
Start: 2023-02-15 — End: 2023-02-15
  Administered 2023-02-15: 1000 ug via INTRAMUSCULAR

## 2023-02-15 NOTE — Progress Notes (Signed)
Patient arrived for a B12 injection and it was administered into her left deltoid. Patient tolerated the injection well and did not show any signs of distress or voice any concerns. 

## 2023-02-16 ENCOUNTER — Ambulatory Visit: Payer: BC Managed Care – PPO | Admitting: Speech Pathology

## 2023-02-16 LAB — MULTIPLE MYELOMA PANEL, SERUM
Albumin SerPl Elph-Mcnc: 3.6 g/dL (ref 2.9–4.4)
Albumin/Glob SerPl: 1.5 (ref 0.7–1.7)
Alpha 1: 0.3 g/dL (ref 0.0–0.4)
Alpha2 Glob SerPl Elph-Mcnc: 0.8 g/dL (ref 0.4–1.0)
B-Globulin SerPl Elph-Mcnc: 0.9 g/dL (ref 0.7–1.3)
Gamma Glob SerPl Elph-Mcnc: 0.6 g/dL (ref 0.4–1.8)
Globulin, Total: 2.5 g/dL (ref 2.2–3.9)
IgA: 92 mg/dL (ref 87–352)
IgG (Immunoglobin G), Serum: 639 mg/dL (ref 586–1602)
IgM (Immunoglobulin M), Srm: 54 mg/dL (ref 26–217)
Total Protein ELP: 6.1 g/dL (ref 6.0–8.5)

## 2023-02-16 NOTE — Therapy (Signed)
OUTPATIENT SPEECH LANGUAGE PATHOLOGY  TREATMENT NOTE DISCHARGE SUMMARY   Patient Name: NATAYSIA FELICIANO MRN: [id number] DOB:July 26, 1976, 47 y.o., female Today's Date: 02/16/2023   PCP: Dale [place], MD REFERRING PROVIDER: Cristopher Peru, MD  END of SESSION  End of Session - 02/16/23 1557     Visit Number 6    Number of Visits 25    Date for SLP Re-Evaluation 04/12/23    Authorization Type [employer] PPO    Progress Note Due on Visit 10    SLP Start Time 1500    SLP Stop Time  1600    SLP Time Calculation (min) 60 min    Activity Tolerance Patient tolerated treatment well            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 hauler    Depression    bipolar   Dysrhythmia    Fainting spell    H/O   H/O cardiac arrhythmia    Migraines    Seizures (HCC)    Past Surgical History:  Procedure Laterality Date   COLONOSCOPY WITH PROPOFOL N/A 06/03/2021   Procedure: COLONOSCOPY WITH PROPOFOL;  Surgeon: Pasty Spillers, MD;  Location: [facility] ENDOSCOPY;  Service: Endoscopy;  Laterality: N/A;   ESOPHAGOGASTRODUODENOSCOPY N/A 06/03/2021   Procedure: ESOPHAGOGASTRODUODENOSCOPY (EGD);  Surgeon: Pasty Spillers, MD;  Location: [facility] END[facility]COPY;  Service: Endoscopy;  Laterality: N/A;   TUBAL LIGATION Bilateral 09/25/2002   tubal reversal     Patient Active Problem List   Diagnosis Date Noted   B12 deficiency 01/28/2023   Leg pain 07/19/2022   ADHD 06/30/2022   Irritable bowel syndrome (IBS) 06/29/2022   Abnormal Pap smear of cervix 03/15/2022   Constipation 01/05/2022   Non-intractable vomiting    Hiatal hernia    Retained food in stomach    Special screening for malignant neoplasms, colon    Rectal polyp    Asthmatic bronchitis with acute exacerbation 05/27/2021   Pre-op evaluation 05/24/2021   Anxiety 02/27/2021   Cough 02/25/2021   Vitamin D deficiency 02/04/2021   Fatigue 09/29/2020   Nausea 09/12/2020   Encounter for  completion of form with patient 07/18/2020   History of COVID-19 06/11/2020   Bronchitis 09/22/2019   Sleep disturbance 03/16/2019   Herpes zoster without complication 01/29/2019   History of seizure 01/22/2019   GERD (gastroesophageal reflux disease) 01/05/2019   Back pain 12/21/2018   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   Abdominal pain 11/19/2018   Conjunctivitis 05/23/2018   Sleeping difficulty 10/31/2017   Rash 06/11/2017   Health care maintenance 10/12/2016   History of miscarriage 10/12/2016   Hypercholesterolemia 07/09/2016   Migraine 07/20/2013   Seizure disorder (HCC) 07/20/2013   Asthma 07/20/2013   Alternating constipation and diarrhea 07/20/2013   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    ONSET DATE: September 2022 (initial dx of COVID);  12/19/2021 (date of referral)  REFERRING DIAG: R41.89 (ICD-10-CM) - Cognitive impairment   THERAPY DIAG:  Cognitive communication deficit  Cognitive impairment  Rationale for Evaluation and Treatment Rehabilitation  SUBJECTIVE:   SUBJECTIVE STATEMENT: Pt overslept,called and re-schedule appt today for 1500, pt brought in her ipad as requested Pt accompanied by: self;   PERTINENT HISTORY: Pt is a 47 year old female with 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   DIAGNOSTIC FINDINGS:  MRI brain with and without contrast 05/20/2022: IMPRESSION:  Mildly limited study due to susceptibility artifact. Symmetric hippocampi. Diffuse mild volume loss.   PAIN:  Are you having pain? No   FALLS: Has patient fallen in last 6 months?  No  LIVING ENVIRONMENT: Lives with: lives with their family and lives  with their spouse Lives in: House/apartment  PLOF:  Level of assistance: Independent with ADLs, Independent with IADLs Employment: Full-time employment; works from home   PATIENT GOALS   to improve cognitive communication abilites  OBJECTIVE:   TODAY'S TREATMENT:  Skilled treatment session focused on pt's cognitive communication goals. SLP facilitated session by providing the following interventions:  Memory: Great functional recall of all medical information related ot current condition; independent use of compensatory memory and xecutive function strategies       PATIENT EDUCATION: Education details: see above Person educated: Patient,  Education method: Medical illustrator Education comprehension: verbalized understanding and needs further education   HOME EXERCISE PROGRAM:   N/A   GOALS:  Goals reviewed with patient? Yes  SHORT TERM GOALS: Target date: 10 sessions  Patient will comprehend 4 pieces of information from a phone message, using repair skills independently including replaying the message.  Baseline: Goal status: INITIAL;met  2.  Patient will organize a work related activity with min cues using organizational template designed by SLP. Baseline:  Goal status: INITIAL;met  3.  Pt will use strategies to improve memory for important information with 75% acc. with minimal assistance (ie., white board, daily planner/calendar, Apps on phone).  Baseline:  Goal status: INITIAL;met   LONG TERM GOALS: Target date: 04/12/2023  Pt will report improved cognitive communication via PROM by 5 points at last ST session Baseline:  Goal status: INITIAL; not met  2.  To improve meta-cognitive awareness/executive functions, patient will a) recall at least one success and one challenge related to communication from the previous week and b) identify at least one strategy to improve a similar challenge in the future across 3 consecutive sessions.  Baseline:   Goal status: INITIAL;met  ASSESSMENT:  CLINICAL IMPRESSION: While pt is able to effective use higher level compensatory strategies for functional memory and executive functions, she remains very fatigued and reports this as most interfering factor in engaging in additional activities to promote cognitive growth/stimulation. Suspect that pt's current cognitive impairment is multifactorial in nature related to extreme fatigue s/p COVID infection along with self-reported difficulty with reading since childhood. At this time, all ST interventions have been exhausted.    PLAN: Discharge from skilled ST services.    Tyianna Menefee B. Dreama Saa, M.S., CCC-SLP, Tree surgeon Certified Brain Injury Specialist [facility]  [facility] Rehabilitation Services Office [phone] Ascom [phone] Fax [phone]

## 2023-02-22 ENCOUNTER — Ambulatory Visit: Payer: BC Managed Care – PPO | Admitting: Speech Pathology

## 2023-02-22 DIAGNOSIS — F900 Attention-deficit hyperactivity disorder, predominantly inattentive type: Secondary | ICD-10-CM | POA: Diagnosis not present

## 2023-02-22 DIAGNOSIS — F3176 Bipolar disorder, in full remission, most recent episode depressed: Secondary | ICD-10-CM | POA: Diagnosis not present

## 2023-02-22 DIAGNOSIS — F3174 Bipolar disorder, in full remission, most recent episode manic: Secondary | ICD-10-CM | POA: Diagnosis not present

## 2023-02-23 ENCOUNTER — Encounter: Payer: Self-pay | Admitting: Oncology

## 2023-02-23 ENCOUNTER — Inpatient Hospital Stay (HOSPITAL_BASED_OUTPATIENT_CLINIC_OR_DEPARTMENT_OTHER): Payer: BC Managed Care – PPO | Admitting: Oncology

## 2023-02-23 VITALS — BP 131/85 | HR 96 | Temp 98.0°F | Resp 18 | Wt 188.3 lb

## 2023-02-23 DIAGNOSIS — D72829 Elevated white blood cell count, unspecified: Secondary | ICD-10-CM

## 2023-02-23 DIAGNOSIS — D472 Monoclonal gammopathy: Secondary | ICD-10-CM | POA: Diagnosis not present

## 2023-02-23 DIAGNOSIS — Z87891 Personal history of nicotine dependence: Secondary | ICD-10-CM | POA: Diagnosis not present

## 2023-02-23 DIAGNOSIS — G40909 Epilepsy, unspecified, not intractable, without status epilepticus: Secondary | ICD-10-CM | POA: Diagnosis not present

## 2023-02-23 DIAGNOSIS — G8929 Other chronic pain: Secondary | ICD-10-CM | POA: Diagnosis not present

## 2023-02-23 DIAGNOSIS — Z79899 Other long term (current) drug therapy: Secondary | ICD-10-CM | POA: Diagnosis not present

## 2023-02-23 DIAGNOSIS — J45909 Unspecified asthma, uncomplicated: Secondary | ICD-10-CM | POA: Diagnosis not present

## 2023-02-23 NOTE — Progress Notes (Signed)
Hematology/Oncology Consult note [phone] [phone]     Patient Care Team: Dale [place], MD as PCP - General (Internal Medicine)  ASSESSMENT & PLAN  MGUS (monoclonal gammopathy of unknown significance) IgA MGUS  Lab Results  Component Value Date   MPROTEIN Not Observed 02/08/2023   KAPLAMBRATIO 1.24 02/08/2023   For now I recommend observation.  Check SPEP and light chain ratio in 6 months  Leukocytosis Chronic, likely reactive due to chronic inflammation, and long term steroid inhaler usage Observation.    Orders Placed This Encounter  Procedures   CBC with Differential (Cancer Center Only)    Standing Status:   Future    Standing Expiration Date:   02/23/2024   CMP (Cancer Center only)    Standing Status:   Future    Standing Expiration Date:   02/23/2024   Kappa/lambda light chains    Standing Status:   Future    Standing Expiration Date:   02/23/2024   Multiple Myeloma Panel (SPEP&IFE w/QIG)    Standing Status:   Future    Standing Expiration Date:   02/23/2024   Follow-up 6 months. All questions were answered. The patient knows to call the clinic with any problems, questions or concerns.   Rickard Patience, MD, PhD [facility] Hematology Oncology 02/23/2023       CHIEF COMPLAINTS/PURPOSE OF CONSULTATION:  Leukocytosis/elevate white count  HISTORY OF PRESENTING ILLNESS:  Heather [name] 47 y.o. female is here because of elevated WBC.  Patient has chronic leukocytosis predominantly neutrophilia.  Dated back to 2016. Patient reports history of IBS, asthma, chronic cough, known COVID symptoms. She has asthma flare multiple times per year, this year she has had steroid treatments 4-5 times.  Currently not on steroids.  T patient reports that each time with asthma flare, her white count goes up to 18-19, and if she takes antibiotics, leukocytosis improves.  Patient uses steroid inhaler 2-3 times per day. Patient has a seizure disorder, she is  on Depakote, There is not reported symptoms of urinary frequency/urgency or dysuria, diarrhea, or abnormal skin rash.  Endorses chronic hip pain, lower extremity pain.  No hand joints deformity or pain. Denies any unintentional weight loss, night sweats or fever.  She denies smoking currently. She had no prior history or diagnosis of cancer.  Her age appropriate screening programs are up-to-date. The patient has no prior diagnosis of autoimmune disease and was not prescribed corticosteroids related products.  INTERVAL HISTORY Heather [name] is a 47 y.o. female who has above history reviewed by me today presents for follow up visit for leukocytosis. Patient reports acute on chronic cough spells.  She has history of asthma, multiple flares.  She worries a lot as she feels Covid has totally changed her life. She researches a lot online.  Predisone inhaler daily long term.   MEDICAL HISTORY:  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 hauler    Depression    bipolar   Dysrhythmia    Fainting spell    H/O   H/O cardiac arrhythmia    Migraines    Seizures (HCC)     SURGICAL HISTORY: Past Surgical History:  Procedure Laterality Date   COLONOSCOPY WITH PROPOFOL N/A 06/03/2021   Procedure: COLONOSCOPY WITH PROPOFOL;  Surgeon: Pasty Spillers, MD;  Location: [facility] ENDOSCOPY;  Service: Endoscopy;  Laterality: N/A;   ESOPHAGOGASTRODUODENOSCOPY N/A 06/03/2021   Procedure: ESOPHAGOGASTRODUODENOSCOPY (EGD);  Surgeon: Pasty Spillers, MD;  Location:  [facility] ENDOSCOPY;  Service: Endoscopy;  Laterality: N/A;   TUBAL LIGATION Bilateral 09/25/2002   tubal reversal      SOCIAL HISTORY: Social History   Socioeconomic History   Marital status: Married    Spouse name: Not on file   Number of children: Not on file   Years of education: Not on file   Highest education level: Not on file  Occupational History   Not on file  Tobacco Use   Smoking status: Former     Packs/day: 0.50    Years: 5.00    Additional pack years: 0.00    Total pack years: 2.50    Types: Cigarettes    Quit date: 2013    Years since quitting: 11.4   Smokeless tobacco: Never   Tobacco comments:    Quit mid October 2014  Vaping Use   Vaping Use: Never used  Substance and Sexual Activity   Alcohol use: No    Alcohol/week: 0.0 standard drinks of alcohol   Drug use: No   Sexual activity: Yes    Birth control/protection: None  Other Topics Concern   Not on file  Social History Narrative   Not on file   Social Determinants of Health   Financial Resource Strain: Not on file  Food Insecurity: Not on file  Transportation Needs: Not on file  Physical Activity: Not on file  Stress: Not on file  Social Connections: Not on file  Intimate Partner Violence: Not on file    FAMILY HISTORY: Family History  Problem Relation Age of Onset   Hypertension Mother    Diabetes Mother    Arthritis Mother    Heart disease Mother    Breast cancer Neg Hx     ALLERGIES:  is allergic to levaquin [levofloxacin], tramadol, amoxicillin, augmentin [amoxicillin-pot clavulanate], erythromycin, and lithium.  MEDICATIONS:  Current Outpatient Medications  Medication Sig Dispense Refill   albuterol (VENTOLIN HFA) 108 (90 Base) MCG/ACT inhaler Inhale 2 puffs into the lungs every 6 (six) hours as needed for wheezing or shortness of breath. 18 g 1   amphetamine-dextroamphetamine (ADDERALL XR) 20 MG 24 hr capsule Take 20 mg by mouth every morning.     benzonatate (TESSALON PERLES) 100 MG capsule Take 1 capsule (100 mg total) by mouth 3 (three) times daily as needed for cough. 21 capsule 0   Budeson-Glycopyrrol-Formoterol (BREZTRI AEROSPHERE) 160-9-4.8 MCG/ACT AERO Inhale 2 puffs into the lungs in the morning and at bedtime. 1 each 5   cetirizine (ZYRTEC) 10 MG tablet Take 1 tablet by mouth once daily 90 tablet 3   diazepam (VALIUM) 10 MG tablet Take 1 tablet by mouth 4 (four) times daily as  needed.     divalproex (DEPAKOTE) 500 MG DR tablet Take by mouth.     ipratropium-albuterol (DUONEB) 0.5-2.5 (3) MG/3ML SOLN Inhale 3 mLs into the lungs every 4 (four) hours as needed. 360 mL 11   magnesium oxide (MAG-OX) 400 MG tablet Take 1 tablet (400 mg total) by mouth daily. 30 tablet 2   omeprazole (PRILOSEC) 40 MG capsule Take 1 capsule (40 mg total) by mouth daily before breakfast. 30 capsule 3   ondansetron (ZOFRAN-ODT) 4 MG disintegrating tablet Take 1 tablet (4 mg total) by mouth 2 (two) times daily as needed for nausea or vomiting. 20 tablet 0   Vitamin D, Ergocalciferol, (DRISDOL) 1.25 MG (50000 UNIT) CAPS capsule Take 1 capsule (50,000 Units total) by mouth every 7 (seven) days. 12 capsule 0   Acetylcysteine 600  MG TBCR Take by mouth. (Patient not taking: Reported on 02/23/2023)     fluticasone (FLONASE) 50 MCG/ACT nasal spray Place 1 spray into both nostrils daily. (Patient not taking: Reported on 02/23/2023) 16 g 0   No current facility-administered medications for this visit.    Review of Systems  Constitutional:  Negative for appetite change, chills, fatigue and fever.  HENT:   Negative for hearing loss and voice change.   Eyes:  Negative for eye problems.  Respiratory:  Negative for chest tightness and cough.   Cardiovascular:  Negative for chest pain.  Gastrointestinal:  Negative for abdominal distention, abdominal pain and blood in stool.  Endocrine: Negative for hot flashes.  Genitourinary:  Negative for difficulty urinating and frequency.   Musculoskeletal:  Positive for arthralgias.  Skin:  Negative for itching and rash.  Neurological:  Negative for extremity weakness.  Hematological:  Negative for adenopathy.  Psychiatric/Behavioral:  Negative for confusion.      PHYSICAL EXAMINATION: ECOG PERFORMANCE STATUS: 0 - Asymptomatic  Vitals:   02/23/23 1001  BP: 131/85  Pulse: 96  Resp: 18  Temp: 98 F (36.7 C)   Filed Weights   02/23/23 1001  Weight: 188 lb  4.8 oz (85.4 kg)    Physical Exam Constitutional:      General: She is not in acute distress.    Appearance: She is not diaphoretic.  HENT:     Head: Normocephalic and atraumatic.     Mouth/Throat:     Pharynx: No oropharyngeal exudate.  Eyes:     General: No scleral icterus. Cardiovascular:     Rate and Rhythm: Normal rate.  Pulmonary:     Effort: Pulmonary effort is normal. No respiratory distress.  Abdominal:     General: There is no distension.     Palpations: Abdomen is soft.     Tenderness: There is no abdominal tenderness.  Musculoskeletal:        General: Normal range of motion.     Cervical back: Normal range of motion and neck supple.  Skin:    Findings: No erythema.  Neurological:     Mental Status: [name] and oriented to person, place, and time. Mental status is at baseline.     Cranial Nerves: No cranial nerve deficit.     Motor: No abnormal muscle tone.  Psychiatric:        Mood and Affect: Affect normal.     LABORATORY DATA:  I have reviewed the data as listed    Latest Ref Rng & Units 02/08/2023   10:58 AM 08/04/2022   11:45 AM 07/19/2022    7:48 AM  CBC  WBC 4.0 - 10.5 K/uL 15.8  15.0  16.8   Hemoglobin 12.0 - 15.0 g/dL 16.1  09.6  04.5   Hematocrit 36.0 - 46.0 % 40.1  40.4  41.6   Platelets 150 - 400 K/uL 353  301  296.0       Latest Ref Rng & Units 02/08/2023   10:58 AM 07/19/2022    7:48 AM 07/10/2022    7:44 AM  CMP  Glucose 70 - 99 mg/dL 96  92  87   BUN 6 - 20 mg/dL 7  12  14    Creatinine 0.44 - 1.00 mg/dL 4.09  8.11  9.14   Sodium 135 - 145 mmol/L 138  136  138   Potassium 3.5 - 5.1 mmol/L 3.8  3.6  3.6   Chloride 98 - 111 mmol/L 104  102  103   CO2 22 - 32 mmol/L 24  25  25    Calcium 8.9 - 10.3 mg/dL 9.0  9.4  8.8   Total Protein 6.5 - 8.1 g/dL 6.6   5.6   Total Bilirubin 0.3 - 1.2 mg/dL 0.3   0.2   Alkaline Phos 38 - 126 U/L 110   103   AST 15 - 41 U/L 16   9   ALT 0 - 44 U/L 16   13    Lab Results  Component Value Date    LDH 127 08/04/2022    RADIOGRAPHIC STUDIES: I have personally reviewed the radiological images as listed and agreed with the findings in the report. No results found.

## 2023-02-23 NOTE — Assessment & Plan Note (Addendum)
IgA MGUS  Lab Results  Component Value Date   MPROTEIN Not Observed 02/08/2023   KAPLAMBRATIO 1.24 02/08/2023   For now I recommend observation.  Check SPEP and light chain ratio in 6 months

## 2023-02-23 NOTE — Assessment & Plan Note (Addendum)
Chronic, likely reactive due to chronic inflammation, and long term steroid inhaler usage Observation.

## 2023-03-01 ENCOUNTER — Ambulatory Visit: Payer: BC Managed Care – PPO | Admitting: Speech Pathology

## 2023-03-02 ENCOUNTER — Encounter: Payer: BC Managed Care – PPO | Admitting: Speech Pathology

## 2023-03-06 ENCOUNTER — Other Ambulatory Visit: Payer: Self-pay | Admitting: Internal Medicine

## 2023-03-06 DIAGNOSIS — J4541 Moderate persistent asthma with (acute) exacerbation: Secondary | ICD-10-CM

## 2023-03-08 ENCOUNTER — Encounter: Payer: BC Managed Care – PPO | Admitting: Speech Pathology

## 2023-03-09 ENCOUNTER — Encounter: Payer: BC Managed Care – PPO | Admitting: Speech Pathology

## 2023-03-15 ENCOUNTER — Encounter: Payer: BC Managed Care – PPO | Admitting: Speech Pathology

## 2023-03-15 ENCOUNTER — Ambulatory Visit (INDEPENDENT_AMBULATORY_CARE_PROVIDER_SITE_OTHER): Payer: BC Managed Care – PPO

## 2023-03-15 DIAGNOSIS — E538 Deficiency of other specified B group vitamins: Secondary | ICD-10-CM

## 2023-03-15 MED ORDER — CYANOCOBALAMIN 1000 MCG/ML IJ SOLN
1000.0000 ug | Freq: Once | INTRAMUSCULAR | Status: AC
Start: 2023-03-15 — End: 2023-03-15
  Administered 2023-03-15: 1000 ug via INTRAMUSCULAR

## 2023-03-15 NOTE — Progress Notes (Signed)
After obtaining consent, and per orders of Dr. Walsh, injection of B-12 given IM in right deltoid by Sumire Halbleib Lynn. Patient tolerated injection well.  

## 2023-03-16 ENCOUNTER — Encounter: Payer: BC Managed Care – PPO | Admitting: Speech Pathology

## 2023-03-22 ENCOUNTER — Encounter: Payer: BC Managed Care – PPO | Admitting: Speech Pathology

## 2023-03-23 ENCOUNTER — Encounter: Payer: BC Managed Care – PPO | Admitting: Speech Pathology

## 2023-03-30 ENCOUNTER — Encounter: Payer: BC Managed Care – PPO | Admitting: Speech Pathology

## 2023-04-04 ENCOUNTER — Other Ambulatory Visit: Payer: Self-pay | Admitting: Internal Medicine

## 2023-04-04 DIAGNOSIS — J4541 Moderate persistent asthma with (acute) exacerbation: Secondary | ICD-10-CM

## 2023-04-05 ENCOUNTER — Encounter: Payer: BC Managed Care – PPO | Admitting: Speech Pathology

## 2023-04-06 ENCOUNTER — Encounter: Payer: BC Managed Care – PPO | Admitting: Speech Pathology

## 2023-04-09 ENCOUNTER — Encounter: Payer: BC Managed Care – PPO | Admitting: Speech Pathology

## 2023-04-11 ENCOUNTER — Encounter: Payer: BC Managed Care – PPO | Admitting: Speech Pathology

## 2023-04-12 ENCOUNTER — Encounter: Payer: BC Managed Care – PPO | Admitting: Speech Pathology

## 2023-04-13 ENCOUNTER — Encounter: Payer: BC Managed Care – PPO | Admitting: Speech Pathology

## 2023-04-16 ENCOUNTER — Encounter: Payer: BC Managed Care – PPO | Admitting: Speech Pathology

## 2023-04-18 ENCOUNTER — Encounter: Payer: BC Managed Care – PPO | Admitting: Speech Pathology

## 2023-04-19 ENCOUNTER — Ambulatory Visit (INDEPENDENT_AMBULATORY_CARE_PROVIDER_SITE_OTHER): Payer: BC Managed Care – PPO

## 2023-04-19 ENCOUNTER — Encounter: Payer: BC Managed Care – PPO | Admitting: Speech Pathology

## 2023-04-19 DIAGNOSIS — G43719 Chronic migraine without aura, intractable, without status migrainosus: Secondary | ICD-10-CM | POA: Diagnosis not present

## 2023-04-19 DIAGNOSIS — F445 Conversion disorder with seizures or convulsions: Secondary | ICD-10-CM | POA: Diagnosis not present

## 2023-04-19 DIAGNOSIS — E538 Deficiency of other specified B group vitamins: Secondary | ICD-10-CM

## 2023-04-19 DIAGNOSIS — U099 Post covid-19 condition, unspecified: Secondary | ICD-10-CM | POA: Diagnosis not present

## 2023-04-19 MED ORDER — CYANOCOBALAMIN 1000 MCG/ML IJ SOLN
1000.0000 ug | Freq: Once | INTRAMUSCULAR | Status: AC
Start: 2023-04-19 — End: 2023-04-19
  Administered 2023-04-19: 1000 ug via INTRAMUSCULAR

## 2023-04-19 NOTE — Progress Notes (Signed)
Patient arrived for a B12 injection and it was administered into her left deltoid. Patient tolerated the injection well and did not show any signs of distress or voice any concerns. 

## 2023-04-20 ENCOUNTER — Encounter: Payer: BC Managed Care – PPO | Admitting: Speech Pathology

## 2023-04-23 ENCOUNTER — Encounter: Payer: BC Managed Care – PPO | Admitting: Speech Pathology

## 2023-04-25 ENCOUNTER — Encounter: Payer: BC Managed Care – PPO | Admitting: Speech Pathology

## 2023-04-25 ENCOUNTER — Encounter (INDEPENDENT_AMBULATORY_CARE_PROVIDER_SITE_OTHER): Payer: Self-pay

## 2023-04-26 ENCOUNTER — Encounter: Payer: BC Managed Care – PPO | Admitting: Speech Pathology

## 2023-04-27 ENCOUNTER — Encounter: Payer: BC Managed Care – PPO | Admitting: Speech Pathology

## 2023-04-27 ENCOUNTER — Ambulatory Visit (INDEPENDENT_AMBULATORY_CARE_PROVIDER_SITE_OTHER): Payer: BC Managed Care – PPO | Admitting: Internal Medicine

## 2023-04-27 ENCOUNTER — Encounter: Payer: Self-pay | Admitting: Internal Medicine

## 2023-04-27 VITALS — BP 138/80 | HR 88 | Temp 97.6°F | Ht 66.5 in | Wt 181.0 lb

## 2023-04-27 DIAGNOSIS — N926 Irregular menstruation, unspecified: Secondary | ICD-10-CM | POA: Diagnosis not present

## 2023-04-27 DIAGNOSIS — J4541 Moderate persistent asthma with (acute) exacerbation: Secondary | ICD-10-CM | POA: Diagnosis not present

## 2023-04-27 DIAGNOSIS — K2210 Ulcer of esophagus without bleeding: Secondary | ICD-10-CM

## 2023-04-27 DIAGNOSIS — R059 Cough, unspecified: Secondary | ICD-10-CM | POA: Diagnosis not present

## 2023-04-27 DIAGNOSIS — K589 Irritable bowel syndrome without diarrhea: Secondary | ICD-10-CM

## 2023-04-27 DIAGNOSIS — D472 Monoclonal gammopathy: Secondary | ICD-10-CM

## 2023-04-27 DIAGNOSIS — E538 Deficiency of other specified B group vitamins: Secondary | ICD-10-CM

## 2023-04-27 DIAGNOSIS — E7800 Pure hypercholesterolemia, unspecified: Secondary | ICD-10-CM

## 2023-04-27 DIAGNOSIS — F909 Attention-deficit hyperactivity disorder, unspecified type: Secondary | ICD-10-CM

## 2023-04-27 DIAGNOSIS — G40909 Epilepsy, unspecified, not intractable, without status epilepticus: Secondary | ICD-10-CM

## 2023-04-27 DIAGNOSIS — F419 Anxiety disorder, unspecified: Secondary | ICD-10-CM

## 2023-04-27 MED ORDER — OMEPRAZOLE 40 MG PO CPDR
40.0000 mg | DELAYED_RELEASE_CAPSULE | Freq: Every day | ORAL | 3 refills | Status: DC
Start: 2023-04-27 — End: 2024-05-20

## 2023-04-27 MED ORDER — ONDANSETRON 4 MG PO TBDP: 4.0000 mg | ORAL_TABLET | Freq: Two times a day (BID) | ORAL | 0 refills | Status: DC | PRN

## 2023-04-27 MED ORDER — PREDNISONE 10 MG PO TABS: ORAL_TABLET | ORAL | 0 refills | Status: DC

## 2023-04-27 MED ORDER — AZITHROMYCIN 250 MG PO TABS: ORAL_TABLET | ORAL | 0 refills | Status: AC

## 2023-04-27 MED ORDER — FLUTICASONE PROPIONATE 50 MCG/ACT NA SUSP: 1.0000 | Freq: Every day | NASAL | 0 refills | Status: DC

## 2023-04-27 MED ORDER — BENZONATATE 100 MG PO CAPS: 100.0000 mg | ORAL_CAPSULE | Freq: Three times a day (TID) | ORAL | 0 refills | Status: DC | PRN

## 2023-04-27 MED ORDER — BREZTRI AEROSPHERE 160-9-4.8 MCG/ACT IN AERO: 2.0000 | INHALATION_SPRAY | Freq: Two times a day (BID) | RESPIRATORY_TRACT | 1 refills | Status: DC

## 2023-04-27 MED ORDER — IPRATROPIUM-ALBUTEROL 0.5-2.5 (3) MG/3ML IN SOLN: 3.0000 mL | RESPIRATORY_TRACT | 11 refills | Status: DC | PRN

## 2023-04-27 NOTE — Progress Notes (Unsigned)
Subjective:    Patient ID: Heather [name], female    DOB: 03-25-1976, 47 y.o.   MRN: [id number]  Patient here for  Chief Complaint  Patient presents with   Follow-up    Pt c/o having a cold that has been lingering for roughly 2-3 weeks, pt states she has a lot of congestion, coughing, and using rescue inhaler/nebulizer more frequently.     HPI Here for f/u regarding asthma. Also has had problems with "long covid". Seeing neurology. S/p MRI. Referred to speech therapy - for cognitive training. Also saw neurology - pseudoseizures.  On depakote.  23 ambulatory EEG 07/27/22 - normal.  Ajovy for migraines.  Recommended continuing cpap.  Seeing Dr Cathie Hoops for f/u MGUS. Evaluated 02/23/23.  Stable.  Recommended check SPEP and light chain ratio - 6 months. Continue cpap.  Reports that starting 2 weeks ago, noticed nasal congestion.  No sore throat.  Subsequently developed chest congestion and cough.  Clear mucus. Over the last couple of days, has moved back into her nose.  Coughing fits.  No increased sob.  Some wheezing.  Using duoneb. Also taking mucinex, tessalon perles and delsym.  Has not been using breztri.  Discussed the need to take regularly.  Has rescue inhaler.  Also reports LMP 02/21/23.  Not using birth control.  Previously took a home pregnancy test - negative.    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 hauler    Depression    bipolar   Dysrhythmia    Fainting spell    H/O   H/O cardiac arrhythmia    Migraines    Seizures (HCC)    Past Surgical History:  Procedure Laterality Date   COLONOSCOPY WITH PROPOFOL N/A 06/03/2021   Procedure: COLONOSCOPY WITH PROPOFOL;  Surgeon: Pasty Spillers, MD;  Location: [facility] ENDOSCOPY;  Service: Endoscopy;  Laterality: N/A;   ESOPHAGOGASTRODUODENOSCOPY N/A 06/03/2021   Procedure: ESOPHAGOGASTRODUODENOSCOPY (EGD);  Surgeon: Pasty Spillers, MD;  Location: [facility] ENDOSCOPY;  Service: Endoscopy;  Laterality: N/A;   TUBAL  LIGATION Bilateral 09/25/2002   tubal reversal     Family History  Problem Relation Age of Onset   Hypertension Mother    Diabetes Mother    Arthritis Mother    Heart disease Mother    Breast cancer Neg Hx    Social History   Socioeconomic History   Marital status: Married    Spouse name: Not on file   Number of children: Not on file   Years of education: Not on file   Highest education level: Not on file  Occupational History   Not on file  Tobacco Use   Smoking status: Former    Current packs/day: 0.00    Average packs/day: 0.5 packs/day for 5.0 years (2.5 ttl pk-yrs)    Types: Cigarettes    Start date: 2008    Quit date: 2013    Years since quitting: 11.5   Smokeless tobacco: Never   Tobacco comments:    Quit mid October 2014  Vaping Use   Vaping status: Never Used  Substance and Sexual Activity   Alcohol use: No    Alcohol/week: 0.0 standard drinks of alcohol   Drug use: No   Sexual activity: Yes    Birth control/protection: None  Other Topics Concern   Not on file  Social History Narrative   Not on file   Social Determinants of Health   Financial Resource Strain: Not on file  Food Insecurity: Not on file  Transportation Needs: Not on file  Physical Activity: Not on file  Stress: Not on file  Social Connections: Not on file     Review of Systems  Constitutional:  Negative for appetite change and unexpected weight change.  HENT:  Positive for congestion. Negative for sore throat.   Respiratory:  Positive for cough and wheezing.        No increased sob.   Cardiovascular:  Negative for chest pain, palpitations and leg swelling.  Gastrointestinal:  Negative for abdominal pain, diarrhea, nausea and vomiting.  Genitourinary:  Negative for difficulty urinating and dysuria.  Musculoskeletal:  Negative for joint swelling and myalgias.  Skin:  Negative for color change and rash.  Neurological:  Negative for dizziness and headaches.  Psychiatric/Behavioral:   Negative for agitation and dysphoric mood.        Objective:     BP 138/80   Pulse 88   Temp 97.6 F (36.4 C) (Oral)   Ht 5' 6.5" (1.689 m)   Wt 181 lb (82.1 kg)   SpO2 98%   BMI 28.78 kg/m  Wt Readings from Last 3 Encounters:  04/27/23 181 lb (82.1 kg)  02/23/23 188 lb 4.8 oz (85.4 kg)  01/25/23 185 lb 12.8 oz (84.3 kg)    Physical Exam Vitals reviewed.  Constitutional:      General: She is not in acute distress.    Appearance: Normal appearance.  HENT:     Head: Normocephalic and atraumatic.     Right Ear: External ear normal.     Left Ear: External ear normal.  Eyes:     General: No scleral icterus.       Right eye: No discharge.        Left eye: No discharge.     Conjunctiva/sclera: Conjunctivae normal.  Neck:     Thyroid: No thyromegaly.  Cardiovascular:     Rate and Rhythm: Normal rate and regular rhythm.  Pulmonary:     Effort: No respiratory distress.     Comments: Increased cough with forced expiration.   Abdominal:     General: Bowel sounds are normal.     Palpations: Abdomen is soft.     Tenderness: There is no abdominal tenderness.  Musculoskeletal:        General: No swelling or tenderness.     Cervical back: Neck supple. No tenderness.  Lymphadenopathy:     Cervical: No cervical adenopathy.  Skin:    Findings: No erythema or rash.  Neurological:     Mental Status: [name].  Psychiatric:        Mood and Affect: Mood normal.        Behavior: Behavior normal.      Outpatient Encounter Medications as of 04/27/2023  Medication Sig   Acetylcysteine 600 MG TBCR Take by mouth.   albuterol (VENTOLIN HFA) 108 (90 Base) MCG/ACT inhaler INHALE 2 PUFFS BY MOUTH EVERY 6 HOURS AS NEEDED FOR WHEEZING AND FOR SHORTNESS OF BREATH   amphetamine-dextroamphetamine (ADDERALL XR) 20 MG 24 hr capsule Take 20 mg by mouth every morning.   ascorbic acid (VITAMIN C) 1000 MG tablet Take by mouth.   azithromycin (ZITHROMAX) 250 MG tablet Take 2 tablets on day  1, then 1 tablet daily on days 2 through 5   cetirizine (ZYRTEC) 10 MG tablet Take 1 tablet by mouth once daily   cyanocobalamin (VITAMIN B12) 1000 MCG/ML injection Inject into the muscle.   diazepam (VALIUM) 10 MG  tablet Take 1 tablet by mouth 4 (four) times daily as needed.   divalproex (DEPAKOTE) 500 MG DR tablet Take by mouth.   magnesium oxide (MAG-OX) 400 MG tablet Take 1 tablet (400 mg total) by mouth daily.   predniSONE (DELTASONE) 10 MG tablet Take 6 talbets x 1 day and then decrease by 1/2 tablet per day until down to zero mg.   Rimegepant Sulfate (NURTEC) 75 MG TBDP Take by mouth.   [DISCONTINUED] benzonatate (TESSALON PERLES) 100 MG capsule Take 1 capsule (100 mg total) by mouth 3 (three) times daily as needed for cough.   [DISCONTINUED] Budeson-Glycopyrrol-Formoterol (BREZTRI AEROSPHERE) 160-9-4.8 MCG/ACT AERO Inhale 2 puffs into the lungs in the morning and at bedtime.   [DISCONTINUED] fluticasone (FLONASE) 50 MCG/ACT nasal spray Place 1 spray into both nostrils daily.   [DISCONTINUED] ipratropium-albuterol (DUONEB) 0.5-2.5 (3) MG/3ML SOLN Inhale 3 mLs into the lungs every 4 (four) hours as needed.   [DISCONTINUED] omeprazole (PRILOSEC) 40 MG capsule Take 1 capsule (40 mg total) by mouth daily before breakfast.   [DISCONTINUED] ondansetron (ZOFRAN-ODT) 4 MG disintegrating tablet Take 1 tablet (4 mg total) by mouth 2 (two) times daily as needed for nausea or vomiting.   benzonatate (TESSALON PERLES) 100 MG capsule Take 1 capsule (100 mg total) by mouth 3 (three) times daily as needed for cough.   Budeson-Glycopyrrol-Formoterol (BREZTRI AEROSPHERE) 160-9-4.8 MCG/ACT AERO Inhale 2 puffs into the lungs in the morning and at bedtime.   fluticasone (FLONASE) 50 MCG/ACT nasal spray Place 1 spray into both nostrils daily.   ipratropium-albuterol (DUONEB) 0.5-2.5 (3) MG/3ML SOLN Inhale 3 mLs into the lungs every 4 (four) hours as needed.   omeprazole (PRILOSEC) 40 MG capsule Take 1 capsule (40  mg total) by mouth daily before breakfast.   ondansetron (ZOFRAN-ODT) 4 MG disintegrating tablet Take 1 tablet (4 mg total) by mouth 2 (two) times daily as needed for nausea or vomiting.   [DISCONTINUED] Vitamin D, Ergocalciferol, (DRISDOL) 1.25 MG (50000 UNIT) CAPS capsule Take 1 capsule (50,000 Units total) by mouth every 7 (seven) days. (Patient not taking: Reported on 04/27/2023)   No facility-administered encounter medications on file as of 04/27/2023.     Lab Results  Component Value Date   WBC 15.8 (H) 02/08/2023   HGB 13.1 02/08/2023   HCT 40.1 02/08/2023   PLT 353 02/08/2023   GLUCOSE 96 02/08/2023   CHOL 207 (H) 07/10/2022   TRIG 350.0 (H) 07/10/2022   HDL 36.60 (L) 07/10/2022   LDLDIRECT 136.0 07/10/2022   LDLCALC 134 (H) 08/29/2021   ALT 16 02/08/2023   AST 16 02/08/2023   NA 138 02/08/2023   K 3.8 02/08/2023   CL 104 02/08/2023   CREATININE 0.75 02/08/2023   BUN 7 02/08/2023   CO2 24 02/08/2023   TSH 1.47 08/29/2021    DG Chest 2 View  Result Date: 10/13/2022 CLINICAL DATA:  cough EXAM: CHEST - 2 VIEW COMPARISON:  05/19/2022 FINDINGS: Cardiac silhouette is unremarkable. No pneumothorax or pleural effusion. The lungs are clear. The visualized skeletal structures are unremarkable. IMPRESSION: No acute cardiopulmonary process. Electronically Signed   By: Layla Maw M.D.   On: 10/13/2022 23:23       Assessment & Plan:  Menstrual changes Assessment & Plan: LMP 02/21/23  not using protection.  Check serum pregnancy test.    Orders: -     hCG, serum, qualitative  Moderate persistent asthma with exacerbation -     Ipratropium-Albuterol; Inhale 3 mLs into the lungs  every 4 (four) hours as needed.  Dispense: 360 mL; Refill: 11  Erosive esophagitis -     Omeprazole; Take 1 capsule (40 mg total) by mouth daily before breakfast.  Dispense: 30 capsule; Refill: 3  Cough, unspecified type Assessment & Plan: Cough and congestion as outlined.  Worsened over the last  couple of weeks.  Cough - productive.  Treat with prednisone taper as directed.  Zpak.  Discussed taking breztri regularly.  Delsym/tessalon perles.  Has duoneb/rescue inhaler if needed.  Follow. Call with update.  Needs f/u with pulmonary.    Attention deficit hyperactivity disorder (ADHD), unspecified ADHD type Assessment & Plan: Evaluation [facility] 02/09/22 - Gregery Na)   Anxiety Assessment & Plan: Followed by psychiatry.    Moderate persistent asthma with acute exacerbation Assessment & Plan: Treat acute flare as outlined.  Breztri.  F/u with pulmonary.    B12 deficiency Assessment & Plan: Continue b12 injections.    Hypercholesterolemia Assessment & Plan: Low cholesterol diet and exercise.  Follow lipid panel.    Irritable bowel syndrome, unspecified type Assessment & Plan: IBS - diarrhea - Dr Allegra Lai    MGUS (monoclonal gammopathy of unknown significance) Assessment & Plan: Seeing Dr Cathie Hoops for f/u MGUS. Evaluated 02/23/23.  Stable.  Recommended check SPEP and light chain ratio - 6 months.   Seizure disorder [facility]) Assessment & Plan: Being followed by neurology.     Other orders -     Benzonatate; Take 1 capsule (100 mg total) by mouth 3 (three) times daily as needed for cough.  Dispense: 21 capsule; Refill: 0 -     Fluticasone Propionate; Place 1 spray into both nostrils daily.  Dispense: 16 g; Refill: 0 -     Ondansetron; Take 1 tablet (4 mg total) by mouth 2 (two) times daily as needed for nausea or vomiting.  Dispense: 20 tablet; Refill: 0 -     Azithromycin; Take 2 tablets on day 1, then 1 tablet daily on days 2 through 5  Dispense: 6 tablet; Refill: 0 -     predniSONE; Take 6 talbets x 1 day and then decrease by 1/2 tablet per day until down to zero mg.  Dispense: 39 tablet; Refill: 0 -     Breztri Aerosphere; Inhale 2 puffs into the lungs in the morning and at bedtime.  Dispense: 1 each; Refill: 1     Dale Bellmore, MD

## 2023-04-29 ENCOUNTER — Telehealth: Payer: Self-pay | Admitting: Internal Medicine

## 2023-04-29 ENCOUNTER — Encounter: Payer: Self-pay | Admitting: Internal Medicine

## 2023-04-29 NOTE — Assessment & Plan Note (Signed)
Low cholesterol diet and exercise.  Follow lipid panel.   

## 2023-04-29 NOTE — Assessment & Plan Note (Signed)
IBS - diarrhea - Dr Allegra Lai

## 2023-04-29 NOTE — Assessment & Plan Note (Signed)
Being followed by neurology.   

## 2023-04-29 NOTE — Telephone Encounter (Signed)
Previously saw Heather [name] (last 04/2022).  Given persistent intermittent asthma flares, etc - needs a f/u appt scheduled.  (Last seen 04/2022).  Per pt, was supposed to f/u and have PFTs, etc.

## 2023-04-29 NOTE — Assessment & Plan Note (Signed)
Seeing Dr Cathie Hoops for f/u MGUS. Evaluated 02/23/23.  Stable.  Recommended check SPEP and light chain ratio - 6 months.

## 2023-04-29 NOTE — Assessment & Plan Note (Signed)
Followed by psychiatry 

## 2023-04-29 NOTE — Assessment & Plan Note (Signed)
LMP 02/21/23  not using protection.  Check serum pregnancy test.

## 2023-04-29 NOTE — Assessment & Plan Note (Signed)
Continue b12 injections.  

## 2023-04-29 NOTE — Assessment & Plan Note (Signed)
Cough and congestion as outlined.  Worsened over the last couple of weeks.  Cough - productive.  Treat with prednisone taper as directed.  Zpak.  Discussed taking breztri regularly.  Delsym/tessalon perles.  Has duoneb/rescue inhaler if needed.  Follow. Call with update.  Needs f/u with pulmonary.

## 2023-04-29 NOTE — Assessment & Plan Note (Signed)
Treat acute flare as outlined.  Breztri.  F/u with pulmonary.

## 2023-04-29 NOTE — Assessment & Plan Note (Signed)
Evaluation [facility] 02/09/22 - Gregery Na)

## 2023-04-30 ENCOUNTER — Encounter: Payer: BC Managed Care – PPO | Admitting: Speech Pathology

## 2023-05-02 ENCOUNTER — Encounter: Payer: BC Managed Care – PPO | Admitting: Speech Pathology

## 2023-05-03 ENCOUNTER — Other Ambulatory Visit: Payer: Self-pay | Admitting: Internal Medicine

## 2023-05-03 ENCOUNTER — Encounter: Payer: BC Managed Care – PPO | Admitting: Speech Pathology

## 2023-05-03 DIAGNOSIS — J4541 Moderate persistent asthma with (acute) exacerbation: Secondary | ICD-10-CM

## 2023-05-03 NOTE — Telephone Encounter (Signed)
Rx sent in for albuterol inhaler.

## 2023-05-04 ENCOUNTER — Encounter: Payer: BC Managed Care – PPO | Admitting: Speech Pathology

## 2023-05-07 ENCOUNTER — Encounter: Payer: BC Managed Care – PPO | Admitting: Speech Pathology

## 2023-05-09 ENCOUNTER — Encounter: Payer: BC Managed Care – PPO | Admitting: Speech Pathology

## 2023-05-09 ENCOUNTER — Telehealth: Payer: Self-pay | Admitting: Pulmonary Disease

## 2023-05-09 NOTE — Telephone Encounter (Signed)
Because of logistics PT would like to switch from Dr. Wynona Neat to Dr. Belia Heman . May I have your approval Dr's?

## 2023-05-09 NOTE — Telephone Encounter (Signed)
Patient scheduled. My chart sent to pt.

## 2023-05-11 NOTE — Telephone Encounter (Signed)
Fine with me to switch 

## 2023-05-14 NOTE — Telephone Encounter (Signed)
Patient is scheduled to see Rubye Oaks, NP on 07/13/2023.  Nothing further needed.

## 2023-05-17 ENCOUNTER — Ambulatory Visit (INDEPENDENT_AMBULATORY_CARE_PROVIDER_SITE_OTHER): Payer: BC Managed Care – PPO

## 2023-05-17 DIAGNOSIS — E538 Deficiency of other specified B group vitamins: Secondary | ICD-10-CM | POA: Diagnosis not present

## 2023-05-17 MED ORDER — CYANOCOBALAMIN 1000 MCG/ML IJ SOLN
1000.0000 ug | Freq: Once | INTRAMUSCULAR | Status: AC
Start: 2023-05-17 — End: 2023-05-17
  Administered 2023-05-17: 1000 ug via INTRAMUSCULAR

## 2023-05-17 NOTE — Progress Notes (Signed)
Patient arrived for a B12 injection and it was administered into her right deltoid. Patient tolerated the injection well and did not show any signs of distress or voice any concerns. 

## 2023-05-24 ENCOUNTER — Ambulatory Visit: Payer: BC Managed Care – PPO

## 2023-06-21 ENCOUNTER — Ambulatory Visit: Payer: BC Managed Care – PPO

## 2023-06-22 ENCOUNTER — Ambulatory Visit (INDEPENDENT_AMBULATORY_CARE_PROVIDER_SITE_OTHER): Payer: BC Managed Care – PPO

## 2023-06-22 DIAGNOSIS — E538 Deficiency of other specified B group vitamins: Secondary | ICD-10-CM | POA: Diagnosis not present

## 2023-06-22 MED ORDER — CYANOCOBALAMIN 1000 MCG/ML IJ SOLN
1000.0000 ug | Freq: Once | INTRAMUSCULAR | Status: AC
Start: 2023-06-22 — End: 2023-06-22
  Administered 2023-06-22: 1000 ug via INTRAMUSCULAR

## 2023-06-22 NOTE — Progress Notes (Signed)
Pt presented for their vitamin B12 injection. Pt was identified through two identifiers. Pt tolerated shot well in their left  deltoid.  

## 2023-06-27 ENCOUNTER — Other Ambulatory Visit: Payer: Self-pay

## 2023-06-29 ENCOUNTER — Other Ambulatory Visit: Payer: Self-pay | Admitting: Internal Medicine

## 2023-06-29 MED ORDER — FLUTICASONE PROPIONATE 50 MCG/ACT NA SUSP: 1.0000 | Freq: Every day | NASAL | 1 refills | Status: DC

## 2023-06-29 MED ORDER — ONDANSETRON 4 MG PO TBDP: 4.0000 mg | ORAL_TABLET | Freq: Two times a day (BID) | ORAL | 0 refills | Status: DC | PRN

## 2023-06-29 NOTE — Telephone Encounter (Signed)
Rx ok'd for flonase and zofran

## 2023-07-02 ENCOUNTER — Ambulatory Visit: Payer: BC Managed Care – PPO | Admitting: Gastroenterology

## 2023-07-09 ENCOUNTER — Other Ambulatory Visit: Payer: Self-pay | Admitting: Internal Medicine

## 2023-07-09 DIAGNOSIS — J4541 Moderate persistent asthma with (acute) exacerbation: Secondary | ICD-10-CM

## 2023-07-13 ENCOUNTER — Ambulatory Visit (INDEPENDENT_AMBULATORY_CARE_PROVIDER_SITE_OTHER): Payer: BC Managed Care – PPO | Admitting: Adult Health

## 2023-07-13 ENCOUNTER — Encounter: Payer: Self-pay | Admitting: Adult Health

## 2023-07-13 VITALS — BP 128/84 | HR 104 | Temp 97.8°F | Ht 66.5 in | Wt 175.8 lb

## 2023-07-13 DIAGNOSIS — J019 Acute sinusitis, unspecified: Secondary | ICD-10-CM | POA: Diagnosis not present

## 2023-07-13 DIAGNOSIS — J4541 Moderate persistent asthma with (acute) exacerbation: Secondary | ICD-10-CM

## 2023-07-13 MED ORDER — AZITHROMYCIN 250 MG PO TABS: ORAL_TABLET | ORAL | 0 refills | Status: AC

## 2023-07-13 MED ORDER — MONTELUKAST SODIUM 10 MG PO TABS: 10.0000 mg | ORAL_TABLET | Freq: Every day | ORAL | 11 refills | Status: DC

## 2023-07-13 NOTE — Assessment & Plan Note (Signed)
Appears compensated on present regimen - continue on Breztri  Continue on trigger prevention  Check PFT on return   Plan  Patient Instructions  Zpack take as directed  Breztri 2 puffs Twice daily, rinse after use.  Continue on Zyrtec.  Restart Singulair 10mg  At bedtime  Saline nasal rinses  Twice daily    Flonase nasal 2 puffs daily  Albuterol inhaler or Duoneb every 6hr as needed   Follow up in 4 months with Dr. Jayme Cloud or Sharmane Dame NP - with PFT . (30 min)  Please contact office for sooner follow up if symptoms do not improve or worsen or seek emergency care    '

## 2023-07-13 NOTE — Progress Notes (Signed)
@Patient  ID: Heather [name], female    DOB: [birth year], 47 y.o.   MRN: [id number]  Chief Complaint  Patient presents with   Follow-up    Referring provider: Dale Pooler, MD  HPI: 47 year old female former smoker followed for moderate persistent asthma Medical history significant for long-haul COVID 2021 Medical history significant for seizure disorder, MGUS, IBS, anxiety, ADHD GERD  TEST/EVENTS :  December 2021 IgE 55, allergy panel positive for dust, dog and cat dander, grass and trees, absolute eosinophil count 100   PFT 04/2022 Pending   Chest x-ray January 2024 clear lungs Chest ray May 19, 2022 clear lungs  07/13/2023 Follow up ; Asthma  Patient presents for 1 year follow-up.  Patient has moderate persistent asthma.  Patient remains on Breztri inhaler twice daily.  She remains on Zyrtec and Singulair daily since last visit patient says her breathing has been doing better with less cough and wheezing . 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  Patient was set up for PFTs last visit but unfortunately have not been completed. No increased albuterol /neb use .  Does complain that Breztri copay is too high at $40. We looked at copay cards to see if this may help. She will reach out to her insurance to see if better options on formulary .  Complains of 3-4 weeks of sinus congestion , drainage, scratchy throat, sinus pressure /teeth pain. Has been using OTC sinus meds without much help. Taking Zyrtec , Claritin and Flonase. Has not been taking Singulair .  Allergies  Allergen Reactions   Levaquin [Levofloxacin] Swelling   Tramadol Other (See Comments)    Seizures   Amoxicillin Nausea And Vomiting   Augmentin [Amoxicillin-Pot Clavulanate] Other (See Comments)   Erythromycin Nausea And Vomiting   Lithium Nausea Only    Immunization History  Administered Date(s) Administered   PPD Test 07/20/2015    Past Medical History:  Diagnosis Date   Asthma    COVID-19  long hauler    Depression    bipolar   Dysrhythmia    Fainting spell    H/O   H/O cardiac arrhythmia    Migraines    Seizures (HCC)     Tobacco History: Social History   Tobacco Use  Smoking Status Former   Current packs/day: 0.00   Average packs/day: 0.5 packs/day for 5.0 years (2.5 ttl pk-yrs)   Types: Cigarettes   Start date: 2008   Quit date: 2013   Years since quitting: 11.8  Smokeless Tobacco Never  Tobacco Comments   Quit mid October 2014   Counseling given: Not Answered Tobacco comments: Quit mid October 2014   Outpatient Medications Prior to Visit  Medication Sig Dispense Refill   Acetylcysteine 600 MG TBCR Take by mouth.     albuterol (VENTOLIN HFA) 108 (90 Base) MCG/ACT inhaler INHALE 1 TO 2 PUFFS BY MOUTH EVERY 4 HOURS AS NEEDED FOR WHEEZING FOR SHORTNESS OF BREATH 18 g 0   amphetamine-dextroamphetamine (ADDERALL XR) 20 MG 24 hr capsule Take 20 mg by mouth every morning.     ascorbic acid (VITAMIN C) 1000 MG tablet Take by mouth.     aspirin-acetaminophen-caffeine (EXCEDRIN MIGRAINE) 250-250-65 MG tablet Take 1 tablet by mouth every 6 (six) hours as needed.     benzonatate (TESSALON PERLES) 100 MG capsule Take 1 capsule (100 mg total) by mouth 3 (three) times daily as needed for cough. 21 capsule 0   Budeson-Glycopyrrol-Formoterol (BREZTRI AEROSPHERE) 160-9-4.8 MCG/ACT AERO Inhale 2 puffs into  the lungs in the morning and at bedtime. 1 each 1   cetirizine (ZYRTEC) 10 MG tablet Take 1 tablet by mouth once daily 90 tablet 3   cyanocobalamin (VITAMIN B12) 1000 MCG/ML injection Inject into the muscle.     diazepam (VALIUM) 10 MG tablet Take 1 tablet by mouth 4 (four) times daily as needed.     divalproex (DEPAKOTE) 500 MG DR tablet Take by mouth.     fluticasone (FLONASE) 50 MCG/ACT nasal spray Place 1 spray into both nostrils daily. 48 g 1   Galcanezumab-gnlm 120 MG/ML SOAJ Inject into the skin.     ipratropium-albuterol (DUONEB) 0.5-2.5 (3) MG/3ML SOLN Inhale 3  mLs into the lungs every 4 (four) hours as needed. 360 mL 11   loratadine (CLARITIN) 10 MG tablet Take 10 mg by mouth daily.     magnesium oxide (MAG-OX) 400 MG tablet Take 1 tablet (400 mg total) by mouth daily. 30 tablet 2   omeprazole (PRILOSEC) 40 MG capsule Take 1 capsule (40 mg total) by mouth daily before breakfast. 30 capsule 3   ondansetron (ZOFRAN-ODT) 4 MG disintegrating tablet Take 1 tablet (4 mg total) by mouth 2 (two) times daily as needed for nausea or vomiting. 20 tablet 0   Rimegepant Sulfate (NURTEC) 75 MG TBDP Take by mouth.     predniSONE (DELTASONE) 10 MG tablet Take 6 talbets x 1 day and then decrease by 1/2 tablet per day until down to zero mg. (Patient not taking: Reported on 07/13/2023) 39 tablet 0   No facility-administered medications prior to visit.     Review of Systems:   Constitutional:   No  weight loss, night sweats,  Fevers, chills, fatigue, or  lassitude.  HEENT:   No headaches,  Difficulty swallowing,  Tooth/dental problems, or  Sore throat,                No sneezing, itching, ear ache,  +nasal congestion, post nasal drip,   CV:  No chest pain,  Orthopnea, PND, swelling in lower extremities, anasarca, dizziness, palpitations, syncope.   GI  No heartburn, indigestion, abdominal pain, nausea, vomiting, diarrhea, change in bowel habits, loss of appetite, bloody stools.   Resp: No shortness of breath with exertion or at rest.  No excess mucus, no productive cough,  No non-productive cough,  No coughing up of blood.  No change in color of mucus.  No wheezing.  No chest wall deformity  Skin: no rash or lesions.  GU: no dysuria, change in color of urine, no urgency or frequency.  No flank pain, no hematuria   MS:  No joint pain or swelling.  No decreased range of motion.  No back pain.    Physical Exam  BP 128/84 (BP Location: Right Arm, Cuff Size: Normal)   Pulse (!) 104   Temp 97.8 F (36.6 C)   Ht 5' 6.5" (1.689 m)   Wt 175 lb 12.8 oz (79.7  kg)   SpO2 99%   BMI 27.95 kg/m   GEN: A/Ox3; pleasant , NAD, well nourished    HEENT:  [place]/AT,  NOSE-clear drainage  THROAT-clear, no lesions, no postnasal drip or exudate noted. Sinus tenderness   NECK:  Supple w/ fair ROM; no JVD; normal carotid impulses w/o bruits; no thyromegaly or nodules palpated; no lymphadenopathy.    RESP  Clear  P & A; w/o, wheezes/ rales/ or rhonchi. no accessory muscle use, no dullness to percussion  CARD:  RRR, no m/r/g, no peripheral  edema, pulses intact, no cyanosis or clubbing.  GI:   Soft & nt; nml bowel sounds; no organomegaly or masses detected.   Musco: Warm bil, no deformities or joint swelling noted.   Neuro: alert, no focal deficits noted.    Skin: Warm, no lesions or rashes    Lab Results:  CBC    BNP No results found for: "BNP"  ProBNP No results found for: "PROBNP"  Imaging: No results found.  cyanocobalamin (VITAMIN B12) injection 1,000 mcg     Date Action Dose Route User   05/17/2023 1605 Given 1,000 mcg Intramuscular (Right Deltoid) Prince Solian A, CMA      cyanocobalamin (VITAMIN B12) injection 1,000 mcg     Date Action Dose Route User   06/22/2023 1530 Given 1,000 mcg Intramuscular (Left Deltoid) Donavan Foil, CMA           No data to display          No results found for: "NITRICOXIDE"      Assessment & Plan:   Asthma Appears compensated on present regimen - continue on Breztri  Continue on trigger prevention  Check PFT on return   Plan  Patient Instructions  Zpack take as directed  Breztri 2 puffs Twice daily, rinse after use.  Continue on Zyrtec.  Restart Singulair 10mg  At bedtime  Saline nasal rinses  Twice daily    Flonase nasal 2 puffs daily  Albuterol inhaler or Duoneb every 6hr as needed   Follow up in 4 months with Dr. Jayme Cloud or Colonel Krauser NP - with PFT . (30 min)  Please contact office for sooner follow up if symptoms do not improve or worsen or seek emergency care     '  Acute sinusitis Treat with empiric antibiotics -Zpack  Saline nasal rinses  Restart Singulair   Plan  Patient Instructions  Zpack take as directed  Breztri 2 puffs Twice daily, rinse after use.  Continue on Zyrtec.  Restart Singulair 10mg  At bedtime  Saline nasal rinses  Twice daily    Flonase nasal 2 puffs daily  Albuterol inhaler or Duoneb every 6hr as needed   Follow up in 4 months with Dr. Jayme Cloud or Liliauna Santoni NP - with PFT . (30 min)  Please contact office for sooner follow up if symptoms do not improve or worsen or seek emergency care         Rubye Oaks, NP 07/13/2023

## 2023-07-13 NOTE — Assessment & Plan Note (Signed)
Treat with empiric antibiotics -Zpack  Saline nasal rinses  Restart Singulair   Plan  Patient Instructions  Zpack take as directed  Breztri 2 puffs Twice daily, rinse after use.  Continue on Zyrtec.  Restart Singulair 10mg  At bedtime  Saline nasal rinses  Twice daily    Flonase nasal 2 puffs daily  Albuterol inhaler or Duoneb every 6hr as needed   Follow up in 4 months with Dr. Jayme Cloud or Yaxiel Minnie NP - with PFT . (30 min)  Please contact office for sooner follow up if symptoms do not improve or worsen or seek emergency care

## 2023-07-13 NOTE — Patient Instructions (Addendum)
Zpack take as directed  Breztri 2 puffs Twice daily, rinse after use.  Continue on Zyrtec.  Restart Singulair 10mg  At bedtime  Saline nasal rinses  Twice daily    Flonase nasal 2 puffs daily  Albuterol inhaler or Duoneb every 6hr as needed   Follow up in 4 months with Dr. Jayme Cloud or Keland Peyton NP - with PFT . (30 min)  Please contact office for sooner follow up if symptoms do not improve or worsen or seek emergency care

## 2023-07-16 NOTE — Progress Notes (Signed)
Patient switching from [place] office to [place] office.  Agree with the details of the visit as noted by Rubye Oaks, NP.  Salena Saner Danice Goltz, MD [facility]

## 2023-07-19 ENCOUNTER — Ambulatory Visit: Payer: BC Managed Care – PPO

## 2023-07-19 DIAGNOSIS — E538 Deficiency of other specified B group vitamins: Secondary | ICD-10-CM | POA: Diagnosis not present

## 2023-07-19 MED ORDER — CYANOCOBALAMIN 1000 MCG/ML IJ SOLN
1000.0000 ug | Freq: Once | INTRAMUSCULAR | Status: AC
Start: 2023-07-19 — End: 2023-07-19
  Administered 2023-07-19: 1000 ug via INTRAMUSCULAR

## 2023-07-19 NOTE — Progress Notes (Signed)
Pt came into clinic for B12 injection. Pt verified through 2 identifications. Injection placed in right deltoid. Pt tolerated injection well.

## 2023-07-23 ENCOUNTER — Telehealth (INDEPENDENT_AMBULATORY_CARE_PROVIDER_SITE_OTHER): Payer: BC Managed Care – PPO | Admitting: Internal Medicine

## 2023-07-23 DIAGNOSIS — J4541 Moderate persistent asthma with (acute) exacerbation: Secondary | ICD-10-CM

## 2023-07-23 DIAGNOSIS — E559 Vitamin D deficiency, unspecified: Secondary | ICD-10-CM | POA: Diagnosis not present

## 2023-07-23 DIAGNOSIS — U071 COVID-19: Secondary | ICD-10-CM | POA: Diagnosis not present

## 2023-07-23 MED ORDER — GUAIFENESIN ER 600 MG PO TB12: 1200.0000 mg | ORAL_TABLET | Freq: Two times a day (BID) | ORAL | 1 refills | Status: DC | PRN

## 2023-07-23 MED ORDER — PREDNISONE 10 MG PO TABS: ORAL_TABLET | ORAL | 0 refills | Status: DC

## 2023-07-23 MED ORDER — HYDROCODONE BIT-HOMATROP MBR 5-1.5 MG/5ML PO SOLN: 5.0000 mL | Freq: Four times a day (QID) | ORAL | 0 refills | Status: DC | PRN

## 2023-07-23 MED ORDER — NIRMATRELVIR/RITONAVIR (PAXLOVID)TABLET: 3.0000 | ORAL_TABLET | Freq: Two times a day (BID) | ORAL | 0 refills | Status: AC

## 2023-07-23 NOTE — Progress Notes (Unsigned)
Patient ID: Heather [name], female   DOB: 30-Apr-1976, 47 y.o.   MRN: [id number]  Virtual Visit via Video Note  I connected with Heather [name] on 07/24/23 at  1:00 PM EDT by a video enabled telemedicine application and verified that I am speaking with the correct person using two identifiers.  Location of all participants today Patient: at home Provider: at office   I discussed the limitations of evaluation and management by telemedicine and the availability of in person appointments. The patient expressed understanding and agreed to proceed.  History of Present Illness:  Here with 2-3 days acute onset fever, facial pain, pressure, headache, general weakness and malaise, and greenish d/c, with mild ST and cough, but pt denies chest pain orthopnea, PND, increased LE swelling, palpitations, dizziness or syncope, but has had mild wheezing sob as wll..  Found to have covid test.    Pt denies polydipsia, polyuria, or new focal neuro s/s.    Pt denies recent wt loss, night sweats, loss of appetite, or other constitutional symptoms   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 hauler    Depression    bipolar   Dysrhythmia    Fainting spell    H/O   H/O cardiac arrhythmia    Migraines    Seizures (HCC)    Past Surgical History:  Procedure Laterality Date   COLONOSCOPY WITH PROPOFOL N/A 06/03/2021   Procedure: COLONOSCOPY WITH PROPOFOL;  Surgeon: Pasty Spillers, MD;  Location: [facility] ENDOSCOPY;  Service: Endoscopy;  Laterality: N/A;   ESOPHAGOGASTRODUODENOSCOPY N/A 06/03/2021   Procedure: ESOPHAGOGASTRODUODENOSCOPY (EGD);  Surgeon: Pasty Spillers, MD;  Location: [facility] ENDOSCOPY;  Service: Endoscopy;  Laterality: N/A;   TUBAL LIGATION Bilateral 09/25/2002   tubal reversal      reports that she quit smoking about 11 years ago. Her smoking use included cigarettes. She started smoking about 16 years ago. She has a 2.5 pack-year smoking history. She has never used  smokeless tobacco. She reports that she does not drink alcohol and does not use drugs. family history includes Arthritis in her mother; Diabetes in her mother; Heart disease in her mother; Hypertension in her mother. Allergies  Allergen Reactions   Levaquin [Levofloxacin] Swelling   Tramadol Other (See Comments)    Seizures   Amoxicillin Nausea And Vomiting   Augmentin [Amoxicillin-Pot Clavulanate] Other (See Comments)   Erythromycin Nausea And Vomiting   Lithium Nausea Only   Current Outpatient Medications on File Prior to Visit  Medication Sig Dispense Refill   Acetylcysteine 600 MG TBCR Take by mouth.     albuterol (VENTOLIN HFA) 108 (90 Base) MCG/ACT inhaler INHALE 1 TO 2 PUFFS BY MOUTH EVERY 4 HOURS AS NEEDED FOR WHEEZING FOR SHORTNESS OF BREATH 18 g 0   amphetamine-dextroamphetamine (ADDERALL XR) 20 MG 24 hr capsule Take 20 mg by mouth every morning.     ascorbic acid (VITAMIN C) 1000 MG tablet Take by mouth.     aspirin-acetaminophen-caffeine (EXCEDRIN MIGRAINE) 250-250-65 MG tablet Take 1 tablet by mouth every 6 (six) hours as needed.     benzonatate (TESSALON PERLES) 100 MG capsule Take 1 capsule (100 mg total) by mouth 3 (three) times daily as needed for cough. 21 capsule 0   Budeson-Glycopyrrol-Formoterol (BREZTRI AEROSPHERE) 160-9-4.8 MCG/ACT AERO Inhale 2 puffs into the lungs in the morning and at bedtime. 1 each 1   cetirizine (ZYRTEC) 10 MG tablet Take 1 tablet by mouth once daily 90 tablet  3   cyanocobalamin (VITAMIN B12) 1000 MCG/ML injection Inject into the muscle.     diazepam (VALIUM) 10 MG tablet Take 1 tablet by mouth 4 (four) times daily as needed.     divalproex (DEPAKOTE) 500 MG DR tablet Take by mouth.     fluticasone (FLONASE) 50 MCG/ACT nasal spray Place 1 spray into both nostrils daily. 48 g 1   Galcanezumab-gnlm 120 MG/ML SOAJ Inject into the skin.     ipratropium-albuterol (DUONEB) 0.5-2.5 (3) MG/3ML SOLN Inhale 3 mLs into the lungs every 4 (four) hours as  needed. 360 mL 11   loratadine (CLARITIN) 10 MG tablet Take 10 mg by mouth daily.     magnesium oxide (MAG-OX) 400 MG tablet Take 1 tablet (400 mg total) by mouth daily. 30 tablet 2   montelukast (SINGULAIR) 10 MG tablet Take 1 tablet (10 mg total) by mouth at bedtime. 30 tablet 11   omeprazole (PRILOSEC) 40 MG capsule Take 1 capsule (40 mg total) by mouth daily before breakfast. 30 capsule 3   ondansetron (ZOFRAN-ODT) 4 MG disintegrating tablet Take 1 tablet (4 mg total) by mouth 2 (two) times daily as needed for nausea or vomiting. 20 tablet 0   predniSONE (DELTASONE) 10 MG tablet Take 6 talbets x 1 day and then decrease by 1/2 tablet per day until down to zero mg. (Patient not taking: Reported on 07/13/2023) 39 tablet 0   Rimegepant Sulfate (NURTEC) 75 MG TBDP Take by mouth.     No current facility-administered medications on file prior to visit.   Observations/Objective: Alert, mild ill, appropriate mood and affect, resps normal, cn 2-12 intact, moves all 4s, no visible rash or swelling Lab Results  Component Value Date   WBC 15.8 (H) 02/08/2023   HGB 13.1 02/08/2023   HCT 40.1 02/08/2023   PLT 353 02/08/2023   GLUCOSE 96 02/08/2023   CHOL 207 (H) 07/10/2022   TRIG 350.0 (H) 07/10/2022   HDL 36.60 (L) 07/10/2022   LDLDIRECT 136.0 07/10/2022   LDLCALC 134 (H) 08/29/2021   ALT 16 02/08/2023   AST 16 02/08/2023   NA 138 02/08/2023   K 3.8 02/08/2023   CL 104 02/08/2023   CREATININE 0.75 02/08/2023   BUN 7 02/08/2023   CO2 24 02/08/2023   TSH 1.47 08/29/2021   Assessment and Plan: See notes  Follow Up Instructions: See notes   I discussed the assessment and treatment plan with the patient. The patient was provided an opportunity to ask questions and all were answered. The patient agreed with the plan and demonstrated an understanding of the instructions.   The patient was advised to call back or seek an in-person evaluation if the symptoms worsen or if the condition fails  to improve as anticipated.   Oliver Barre, MD

## 2023-07-24 ENCOUNTER — Encounter: Payer: Self-pay | Admitting: Internal Medicine

## 2023-07-24 DIAGNOSIS — U071 COVID-19: Secondary | ICD-10-CM | POA: Insufficient documentation

## 2023-07-24 NOTE — Patient Instructions (Signed)
Please take all new medication as prescribed 

## 2023-07-24 NOTE — Assessment & Plan Note (Addendum)
With mild exacerbation - for prednisone taper, mucinex bid prn

## 2023-07-24 NOTE — Assessment & Plan Note (Signed)
Last vitamin D Lab Results  Component Value Date   VD25OH 30.03 02/04/2021   Low, to start oral replacement

## 2023-07-24 NOTE — Assessment & Plan Note (Signed)
Mild to mod, for antibx course - paxlovid course, cough med prn  to f/u any worsening symptoms or concerns

## 2023-07-27 ENCOUNTER — Encounter: Payer: Self-pay | Admitting: Internal Medicine

## 2023-07-27 NOTE — Telephone Encounter (Signed)
LMTCB

## 2023-07-30 ENCOUNTER — Encounter: Payer: Self-pay | Admitting: Family Medicine

## 2023-07-30 ENCOUNTER — Other Ambulatory Visit: Payer: Self-pay

## 2023-07-30 ENCOUNTER — Emergency Department
Admission: EM | Admit: 2023-07-30 | Discharge: 2023-07-30 | Disposition: A | Discharge status: Home / Self Care | Payer: BC Managed Care – PPO | Attending: Emergency Medicine | Admitting: Emergency Medicine

## 2023-07-30 ENCOUNTER — Telehealth (INDEPENDENT_AMBULATORY_CARE_PROVIDER_SITE_OTHER): Payer: BC Managed Care – PPO | Admitting: Family Medicine

## 2023-07-30 ENCOUNTER — Emergency Department: Payer: BC Managed Care – PPO

## 2023-07-30 VITALS — Ht 66.5 in | Wt 175.0 lb

## 2023-07-30 DIAGNOSIS — U071 COVID-19: Secondary | ICD-10-CM | POA: Insufficient documentation

## 2023-07-30 DIAGNOSIS — J4541 Moderate persistent asthma with (acute) exacerbation: Secondary | ICD-10-CM | POA: Diagnosis not present

## 2023-07-30 DIAGNOSIS — R059 Cough, unspecified: Secondary | ICD-10-CM | POA: Diagnosis present

## 2023-07-30 DIAGNOSIS — R0600 Dyspnea, unspecified: Secondary | ICD-10-CM

## 2023-07-30 DIAGNOSIS — Z8616 Personal history of COVID-19: Secondary | ICD-10-CM

## 2023-07-30 DIAGNOSIS — J45909 Unspecified asthma, uncomplicated: Secondary | ICD-10-CM | POA: Diagnosis not present

## 2023-07-30 MED ORDER — IPRATROPIUM-ALBUTEROL 0.5-2.5 (3) MG/3ML IN SOLN
3.0000 mL | Freq: Once | RESPIRATORY_TRACT | Status: AC
  Administered 2023-07-30: 3 mL via RESPIRATORY_TRACT
  Filled 2023-07-30: qty 3

## 2023-07-30 MED ORDER — HYDROCOD POLI-CHLORPHE POLI ER 10-8 MG/5ML PO SUER
5.0000 mL | Freq: Once | ORAL | Status: AC
  Administered 2023-07-30: 5 mL via ORAL
  Filled 2023-07-30: qty 5

## 2023-07-30 MED ORDER — GUAIFENESIN-CODEINE 100-10 MG/5ML PO SOLN: 5.0000 mL | Freq: Four times a day (QID) | ORAL | 0 refills | Status: DC | PRN

## 2023-07-30 MED ORDER — PREDNISONE 10 MG PO TABS: 10.0000 mg | ORAL_TABLET | Freq: Every day | ORAL | 0 refills | Status: DC

## 2023-07-30 MED ORDER — DEXAMETHASONE SODIUM PHOSPHATE 10 MG/ML IJ SOLN
10.0000 mg | Freq: Once | INTRAMUSCULAR | Status: AC
  Administered 2023-07-30: 10 mg via INTRAMUSCULAR
  Filled 2023-07-30: qty 1

## 2023-07-30 NOTE — ED Triage Notes (Signed)
Pt comes with c/o covid + . Pt states she did have virtual visit and was advised to come here. Pt did use inhaler at home with little relief. Pt states she just isn't getting better.

## 2023-07-30 NOTE — Assessment & Plan Note (Signed)
Recent positive test with ongoing symptoms including chest tightness and shortness of breath. Completed course of Paxlovid and Prednisone. No recent fevers. -Recommended evaluation in the emergency department or urgent care due to ongoing symptoms and inability to fully evaluate via telehealth visit.

## 2023-07-30 NOTE — Progress Notes (Signed)
Virtual Visit via Video note  I connected with Heather [name] on 07/30/23 at 1034 by video and verified that I am speaking with the correct person using two identifiers. Heather [name] is currently located at home and is currently alone during visit. The provider, Dana Allan, MD is located in their office at time of visit.  I discussed the limitations, risks, security and privacy concerns of performing an evaluation and management service by video and the availability of in person appointments. I also discussed with the patient that there may be a patient responsible charge related to this service. The patient expressed understanding and agreed to proceed.  Subjective: PCP: Heather [place], MD  Chief Complaint  Patient presents with   Covid Positive    Tested + 07/23/2023    HPI Discussed the use of AI scribe software for clinical note transcription with the patient, who gave verbal consent to proceed.  History of Present Illness The patient, with a history of long-haul COVID-19 and chronic asthma, presented with a recurrence of symptoms similar to her initial COVID-19 infection three years ago. She reported feeling unwell, experiencing body aches, and a sensation different from her typical allergy and sinus symptoms. This prompted her to test for COVID-19, which returned positive.  The patient was treated with Paxlovid and prednisone, with one dose of prednisone remaining. Despite treatment, she continued to experience asthmatic issues and sinus symptoms. She also reported chest tightness, similar to her usual asthma symptoms, and shortness of breath, particularly when speaking.   The patient had a fever, but it had resolved in the past few days. She was unable to provide current oxygen saturation levels. Despite feeling unwell, she returned to work for a ten-hour shift. She was due for a breathing treatment at the time of the consultation.  The patient has a history of requiring  steroid injections and increased doses of prednisone during severe asthma episodes. She also has regular follow-ups with a pulmonologist.   ROS: Per HPI  Current Outpatient Medications:    Acetylcysteine 600 MG TBCR, Take by mouth., Disp: , Rfl:    albuterol (VENTOLIN HFA) 108 (90 Base) MCG/ACT inhaler, INHALE 1 TO 2 PUFFS BY MOUTH EVERY 4 HOURS AS NEEDED FOR WHEEZING FOR SHORTNESS OF BREATH, Disp: 18 g, Rfl: 0   amphetamine-dextroamphetamine (ADDERALL XR) 20 MG 24 hr capsule, Take 20 mg by mouth every morning., Disp: , Rfl:    ascorbic acid (VITAMIN C) 1000 MG tablet, Take by mouth., Disp: , Rfl:    aspirin-acetaminophen-caffeine (EXCEDRIN MIGRAINE) 250-250-65 MG tablet, Take 1 tablet by mouth every 6 (six) hours as needed., Disp: , Rfl:    benzonatate (TESSALON PERLES) 100 MG capsule, Take 1 capsule (100 mg total) by mouth 3 (three) times daily as needed for cough., Disp: 21 capsule, Rfl: 0   Budeson-Glycopyrrol-Formoterol (BREZTRI AEROSPHERE) 160-9-4.8 MCG/ACT AERO, Inhale 2 puffs into the lungs in the morning and at bedtime., Disp: 1 each, Rfl: 1   cetirizine (ZYRTEC) 10 MG tablet, Take 1 tablet by mouth once daily, Disp: 90 tablet, Rfl: 3   cyanocobalamin (VITAMIN B12) 1000 MCG/ML injection, Inject into the muscle., Disp: , Rfl:    diazepam (VALIUM) 10 MG tablet, Take 1 tablet by mouth 4 (four) times daily as needed., Disp: , Rfl:    divalproex (DEPAKOTE) 500 MG DR tablet, Take by mouth., Disp: , Rfl:    fluticasone (FLONASE) 50 MCG/ACT nasal spray, Place 1 spray into both nostrils daily., Disp: 48 g, Rfl:  1   Galcanezumab-gnlm 120 MG/ML SOAJ, Inject into the skin., Disp: , Rfl:    guaiFENesin (MUCINEX) 600 MG 12 hr tablet, Take 2 tablets (1,200 mg total) by mouth 2 (two) times daily as needed., Disp: 60 tablet, Rfl: 1   ipratropium-albuterol (DUONEB) 0.5-2.5 (3) MG/3ML SOLN, Inhale 3 mLs into the lungs every 4 (four) hours as needed., Disp: 360 mL, Rfl: 11   loratadine (CLARITIN) 10 MG  tablet, Take 10 mg by mouth daily., Disp: , Rfl:    magnesium oxide (MAG-OX) 400 MG tablet, Take 1 tablet (400 mg total) by mouth daily., Disp: 30 tablet, Rfl: 2   montelukast (SINGULAIR) 10 MG tablet, Take 1 tablet (10 mg total) by mouth at bedtime., Disp: 30 tablet, Rfl: 11   omeprazole (PRILOSEC) 40 MG capsule, Take 1 capsule (40 mg total) by mouth daily before breakfast., Disp: 30 capsule, Rfl: 3   ondansetron (ZOFRAN-ODT) 4 MG disintegrating tablet, Take 1 tablet (4 mg total) by mouth 2 (two) times daily as needed for nausea or vomiting., Disp: 20 tablet, Rfl: 0   predniSONE (DELTASONE) 10 MG tablet, 3 tabs by mouth per day for 3 days,2tabs per day for 3 days,1tab per day for 3 days, Disp: 18 tablet, Rfl: 0   Rimegepant Sulfate (NURTEC) 75 MG TBDP, Take by mouth., Disp: , Rfl:   Observations/Objective: Physical Exam Pulmonary:     Effort: Respiratory distress present.     Breath sounds: Wheezing present.  Chest:     Chest wall: Tenderness (coughing and only able to speak in 1-2 word sentences.  Shortness of breath and appears tachypneic on video) present.  Neurological:     Mental Status: [name] and oriented to person, place, and time. Mental status is at baseline.  Psychiatric:        Mood and Affect: Mood normal.        Behavior: Behavior normal.        Thought Content: Thought content normal.        Judgment: Judgment normal.    Assessment and Plan: Moderate persistent asthma with acute exacerbation Assessment & Plan: Chronic condition, currently experiencing increased chest tightness and shortness of breath. Unable to speak in short sentences without shortness of breath or cough. Appears tachypneic on video.  Has had to increase breathing treatments despite recent treatment for COVID and steroids, -Recommended evaluation in the emergency department or urgent care for potential additional treatment   History of COVID-19 Assessment & Plan: Recent positive test with  ongoing symptoms including chest tightness and shortness of breath. Completed course of Paxlovid and Prednisone. No recent fevers. -Recommended evaluation in the emergency department or urgent care due to ongoing symptoms and inability to fully evaluate via telehealth visit.    Follow Up Instructions: Return if symptoms worsen or fail to improve, for PCP.   I discussed the assessment and treatment plan with the patient. The patient was provided an opportunity to ask questions and all were answered. The patient agreed with the plan and demonstrated an understanding of the instructions.   The patient was advised to call back or seek an in-person evaluation if the symptoms worsen or if the condition fails to improve as anticipated.  The above assessment and management plan was discussed with the patient. The patient verbalized understanding of and has agreed to the management plan. Patient is aware to call the clinic if symptoms persist or worsen. Patient is aware when to return to the clinic for a follow-up visit.  Patient educated on when it is appropriate to go to the emergency department.    Dana Allan, MD

## 2023-07-30 NOTE — Patient Instructions (Signed)
It was a pleasure meeting you today. Thank you for allowing me to take part in your health care.  Our goals for today as we discussed include:  Recommend ED evaluation  You are having difficulty breathing and I recommend an evaluation by physician.   If you have any questions or concerns, please do not hesitate to call the office at [phone].  I look forward to our next visit and until then take care and stay safe.  Regards,   Dana Allan, MD   [facility]

## 2023-07-30 NOTE — ED Provider Notes (Signed)
[facility] Provider Note    Event Date/Time   First MD Initiated Contact with Patient 07/30/23 1241     (approximate)  History   Chief Complaint: covid+  HPI  Heather [name] is a 47 y.o. female with a past medical history of asthma, migraines, presents to the emergency department for 1 week of cough congestion shortness of breath.  According to the patient for the last week or more she has been coughing with congestion and shortness of breath.  She did a home COVID test 6 days ago that was positive and did a telehealth visit was prescribed several days of prednisone as well as Paxlovid which the patient has taken.  States she was doing better when she was on the higher dose prednisone but as it tapered down the cough and shortness of breath got worse.  Patient states a history of asthma also states a history of long COVID previously.  Patient denies any recent fever.  Denies any chest pain.  Has been using her inhaler at home but states is not helping enough.  Physical Exam   Triage Vital Signs: ED Triage Vitals  Encounter Vitals Group     BP 07/30/23 1230 (!) 184/98     Systolic BP Percentile --      Diastolic BP Percentile --      Pulse Rate 07/30/23 1230 98     Resp 07/30/23 1230 20     Temp 07/30/23 1230 98.6 F (37 C)     Temp Source 07/30/23 1230 Oral     SpO2 07/30/23 1230 98 %     Weight 07/30/23 1234 185 lb (83.9 kg)     Height 07/30/23 1234 5' 6.5" (1.689 m)     Head Circumference --      Peak Flow --      Pain Score 07/30/23 1233 7     Pain Loc --      Pain Education --      Exclude from Growth Chart --     Most recent vital signs: Vitals:   07/30/23 1230  BP: (!) 184/98  Pulse: 98  Resp: 20  Temp: 98.6 F (37 C)  SpO2: 98%    General: Awake, no distress.  CV:  Good peripheral perfusion.  Regular rate and rhythm  Resp:  Normal effort.  Equal breath sounds bilaterally.  Mild expiratory wheeze bilaterally.  Frequent  cough. Abd:  No distention.  Soft, nontender.  No rebound or guarding.   ED Results / Procedures / Treatments   RADIOLOGY  I reviewed and interpreted chest x-ray images.  No obvious consolidation on my evaluation. Radiology has read the chest x-ray is negative   MEDICATIONS ORDERED IN ED: Medications  ipratropium-albuterol (DUONEB) 0.5-2.5 (3) MG/3ML nebulizer solution 3 mL (has no administration in time range)  ipratropium-albuterol (DUONEB) 0.5-2.5 (3) MG/3ML nebulizer solution 3 mL (has no administration in time range)  dexamethasone (DECADRON) injection 10 mg (has no administration in time range)  chlorpheniramine-HYDROcodone (TUSSIONEX) 10-8 MG/5ML suspension 5 mL (has no administration in time range)     IMPRESSION / MDM / ASSESSMENT AND PLAN / ED COURSE  I reviewed the triage vital signs and the nursing notes.  Patient's presentation is most consistent with acute presentation with potential threat to life or bodily function.  Patient presents emergency department for worsening cough congestion and shortness of breath over the past 7 to 10 days.  Diagnosed with COVID 6 days ago.  Has completed  Paxlovid as well as several days of prednisone with some relief although states she feels like the shortness of breath is worsening once again.  Patient denies any chest pain.  Denies any fever.  Overall the patient appears well, no distress.  Does have expiratory wheezes bilaterally.  We will dose 2 breathing treatments in the emergency department.  We will dose cough medication and administer an intramuscular shot of Decadron.  We will obtain a chest x-ray as a precaution to rule out pneumonia or any other abnormality.  His lungs the chest x-ray does not show any significant findings anticipate likely discharge home and a longer prednisone taper and cough medication.  Patient is agreeable to plan of care.  Chest x-ray is negative.  Patient is feeling better after medications and breathing  treatments.  Will discharge on a prednisone taper as well as a cough medication.  Patient to follow-up with her PCP.  Provided my typical COVID return precautions.  FINAL CLINICAL IMPRESSION(S) / ED DIAGNOSES   COVID-19 Dyspnea    Note:  This document was prepared using Dragon voice recognition software and may include unintentional dictation errors.   Minna Antis, MD 07/30/23 1444

## 2023-07-30 NOTE — Assessment & Plan Note (Signed)
Chronic condition, currently experiencing increased chest tightness and shortness of breath. Unable to speak in short sentences without shortness of breath or cough. Appears tachypneic on video.  Has had to increase breathing treatments despite recent treatment for COVID and steroids, -Recommended evaluation in the emergency department or urgent care for potential additional treatment

## 2023-07-31 ENCOUNTER — Telehealth: Payer: Self-pay | Admitting: Internal Medicine

## 2023-07-31 NOTE — Telephone Encounter (Signed)
Patient has been scheduled for ED follow up. She is doing some better since going to the ED. Feels like the steroids and neb treatments are working. Using inhalers. Will call pulmonary to let them know as well. Scheduled for Friday per patient.

## 2023-07-31 NOTE — Telephone Encounter (Signed)
Bethann Berkshire - see my chart message on this pt.

## 2023-07-31 NOTE — Telephone Encounter (Signed)
Heather [name] as we discussed - I Reviewed chart.  Was seen in ER yesterday. Given prednisone.  Need to know how she is doing today.  Using inhalers?  Nebs?  Can schedule f/u appt.  Also sees pulmonary

## 2023-07-31 NOTE — Telephone Encounter (Signed)
See mychart.  

## 2023-07-31 NOTE — Telephone Encounter (Signed)
Pt called stating she was in the ER and they wanted her to make an appointment with her provider and they are no available appointments. I tried to make one with another provider and she stated for last two ER follow-ups she has had it has been with other providers. She want to be seen by her doctor

## 2023-08-02 ENCOUNTER — Other Ambulatory Visit: Payer: Self-pay | Admitting: Internal Medicine

## 2023-08-02 DIAGNOSIS — J4541 Moderate persistent asthma with (acute) exacerbation: Secondary | ICD-10-CM

## 2023-08-03 ENCOUNTER — Telehealth (INDEPENDENT_AMBULATORY_CARE_PROVIDER_SITE_OTHER): Payer: BC Managed Care – PPO | Admitting: Internal Medicine

## 2023-08-03 VITALS — BP 132/86 | HR 96

## 2023-08-03 DIAGNOSIS — K219 Gastro-esophageal reflux disease without esophagitis: Secondary | ICD-10-CM | POA: Diagnosis not present

## 2023-08-03 DIAGNOSIS — U071 COVID-19: Secondary | ICD-10-CM

## 2023-08-03 DIAGNOSIS — J4541 Moderate persistent asthma with (acute) exacerbation: Secondary | ICD-10-CM | POA: Diagnosis not present

## 2023-08-03 MED ORDER — AZITHROMYCIN 250 MG PO TABS: ORAL_TABLET | ORAL | 0 refills | Status: AC

## 2023-08-03 MED ORDER — GUAIFENESIN-CODEINE 100-10 MG/5ML PO SOLN: 5.0000 mL | Freq: Four times a day (QID) | ORAL | 0 refills | Status: DC | PRN

## 2023-08-03 MED ORDER — PREDNISONE 10 MG PO TABS: ORAL_TABLET | ORAL | 0 refills | Status: DC

## 2023-08-03 NOTE — Progress Notes (Unsigned)
Patient ID: Heather [name], female   DOB: 1976-04-06, 47 y.o.   MRN: [id number]   Virtual Visit via video Note  I connected with Richardson Dopp by a video enabled telemedicine application and verified that I am speaking with the correct person using two identifiers. Location patient: home Location provider: work  Persons participating in the virtual visit: patient, provider  The limitations, risks, security and privacy concerns of performing an evaluation and management service by video and the availability of in person appointments have been discussed.  It has also been discussed with the patient that there may be a patient responsible charge related to this service. The patient expressed understanding and agreed to proceed.   Reason for visit: work in appt  HPI: Work in with concerns regarding persistent cough and congestion. Was initially seen 07/23/23 - with 2-3 days acute onset fever, facial pain, pressure, headache, general weakness and malaise, and greenish d/c, with mild ST and cough. Home covid test - positive. Treated with paxlovid and prednisone. Reevaluated 07/30/23 - Dr Clent Ridges. Sent to ER for evaluation. In ER received two nebulizer treatments and decadron.  Also given cough medication. CXR - no acute abnormality. Was discharged on prednisone and cough medication.  Persistent increased cough - evaluation today.  She is feeling some better, but is tired.  Increased cough - more fatigue.  Still with congestion.  Eating - decreased appetite.  Some intermittent increased nausea.  Talking aggravating cough.    ROS: See pertinent positives and negatives per HPI.  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 hauler    Depression    bipolar   Dysrhythmia    Fainting spell    H/O   H/O cardiac arrhythmia    Migraines    Seizures (HCC)     Past Surgical History:  Procedure Laterality Date   COLONOSCOPY WITH PROPOFOL N/A 06/03/2021   Procedure: COLONOSCOPY WITH  PROPOFOL;  Surgeon: Pasty Spillers, MD;  Location: [facility] ENDOSCOPY;  Service: Endoscopy;  Laterality: N/A;   ESOPHAGOGASTRODUODENOSCOPY N/A 06/03/2021   Procedure: ESOPHAGOGASTRODUODENOSCOPY (EGD);  Surgeon: Pasty Spillers, MD;  Location: [facility] ENDOSCOPY;  Service: Endoscopy;  Laterality: N/A;   TUBAL LIGATION Bilateral 09/25/2002   tubal reversal      Family History  Problem Relation Age of Onset   Hypertension Mother    Diabetes Mother    Arthritis Mother    Heart disease Mother    Breast cancer Neg Hx     SOCIAL HX: reviewed.    Current Outpatient Medications:    azithromycin (ZITHROMAX) 250 MG tablet, Take 2 tablets on day 1, then 1 tablet daily on days 2 through 5, Disp: 6 tablet, Rfl: 0   predniSONE (DELTASONE) 10 MG tablet, Take 6 tablets x 1 day and then decrease by 1/2 tablet per day until down to zero mg, Disp: 39 tablet, Rfl: 0   Acetylcysteine 600 MG TBCR, Take by mouth., Disp: , Rfl:    albuterol (VENTOLIN HFA) 108 (90 Base) MCG/ACT inhaler, INHALE 1 TO 2 PUFFS BY MOUTH EVERY 4 HOURS AS NEEDED FOR WHEEZING FOR SHORTNESS OF BREATH, Disp: 18 g, Rfl: 1   amphetamine-dextroamphetamine (ADDERALL XR) 20 MG 24 hr capsule, Take 20 mg by mouth every morning., Disp: , Rfl:    ascorbic acid (VITAMIN C) 1000 MG tablet, Take by mouth., Disp: , Rfl:    aspirin-acetaminophen-caffeine (EXCEDRIN MIGRAINE) 250-250-65 MG tablet, Take 1 tablet by mouth every 6 (six) hours  as needed., Disp: , Rfl:    benzonatate (TESSALON PERLES) 100 MG capsule, Take 1 capsule (100 mg total) by mouth 3 (three) times daily as needed for cough., Disp: 21 capsule, Rfl: 0   Budeson-Glycopyrrol-Formoterol (BREZTRI AEROSPHERE) 160-9-4.8 MCG/ACT AERO, Inhale 2 puffs into the lungs in the morning and at bedtime., Disp: 1 each, Rfl: 1   cetirizine (ZYRTEC) 10 MG tablet, Take 1 tablet by mouth once daily, Disp: 90 tablet, Rfl: 3   cyanocobalamin (VITAMIN B12) 1000 MCG/ML injection, Inject into the muscle., Disp:  , Rfl:    diazepam (VALIUM) 10 MG tablet, Take 1 tablet by mouth 4 (four) times daily as needed., Disp: , Rfl:    divalproex (DEPAKOTE) 500 MG DR tablet, Take by mouth., Disp: , Rfl:    fluticasone (FLONASE) 50 MCG/ACT nasal spray, Place 1 spray into both nostrils daily., Disp: 48 g, Rfl: 1   Galcanezumab-gnlm 120 MG/ML SOAJ, Inject into the skin., Disp: , Rfl:    guaiFENesin (MUCINEX) 600 MG 12 hr tablet, Take 2 tablets (1,200 mg total) by mouth 2 (two) times daily as needed., Disp: 60 tablet, Rfl: 1   guaiFENesin-codeine 100-10 MG/5ML syrup, Take 5 mLs by mouth every 6 (six) hours as needed., Disp: 120 mL, Rfl: 0   ipratropium-albuterol (DUONEB) 0.5-2.5 (3) MG/3ML SOLN, Inhale 3 mLs into the lungs every 4 (four) hours as needed., Disp: 360 mL, Rfl: 11   loratadine (CLARITIN) 10 MG tablet, Take 10 mg by mouth daily., Disp: , Rfl:    magnesium oxide (MAG-OX) 400 MG tablet, Take 1 tablet (400 mg total) by mouth daily., Disp: 30 tablet, Rfl: 2   montelukast (SINGULAIR) 10 MG tablet, Take 1 tablet (10 mg total) by mouth at bedtime., Disp: 30 tablet, Rfl: 11   omeprazole (PRILOSEC) 40 MG capsule, Take 1 capsule (40 mg total) by mouth daily before breakfast., Disp: 30 capsule, Rfl: 3   ondansetron (ZOFRAN-ODT) 4 MG disintegrating tablet, Take 1 tablet (4 mg total) by mouth 2 (two) times daily as needed for nausea or vomiting., Disp: 20 tablet, Rfl: 0   Rimegepant Sulfate (NURTEC) 75 MG TBDP, Take by mouth., Disp: , Rfl:   EXAM:  VITALS per patient if applicable: pulse ox today 97% with pulse 101.   GENERAL: alert, oriented, appears in no acute distress.   HEENT: atraumatic, conjunttiva clear, no obvious abnormalities on inspection of external nose and ears  NECK: normal movements of the head and neck  LUNGS: on inspection no signs of respiratory distress, breathing rate appears normal, no obvious gross SOB.  Increased cough with talking.   CV: no obvious cyanosis  PSYCH/NEURO: pleasant and  cooperative, no obvious depression or anxiety, speech and thought processing grossly intact  ASSESSMENT AND PLAN:  Discussed the following assessment and plan:  Problem List Items Addressed This Visit     Asthma - Primary    With recent diagnosis of covid. Treated with paxlovid and prednisone.  Continue breztri.  Back to taking bid now. Has nebulizer treatments at home.  Continue nebs as discussed.  Has rescue inhaler to use prn.  Will increase prednisone dose and slow taper - 60mg  and decrease by 5mg  until off.  Add zpak as directed for possible overlying infection.  Refilled cough medication.  Pulse ox 97%.  Follow closely.  If any worsening change or problems, she is to be evaluated.       Relevant Medications   predniSONE (DELTASONE) 10 MG tablet   Asthmatic bronchitis with acute  exacerbation    Flare as outlined.  Will treat with empiric abx and increased steroids as outlined.  Recent CXR - no acute abnormality.  Continue breztri and nebs as directed.  Cough syrup prn.       Relevant Medications   predniSONE (DELTASONE) 10 MG tablet   COVID-19 virus infection    Recent diagnosis as outlined.  Treated with paxlovid.  Cxr ok.  Treat persistent symptoms as outlined.       Relevant Medications   azithromycin (ZITHROMAX) 250 MG tablet   GERD (gastroesophageal reflux disease)    No increased acid reflux reported.        Return if symptoms worsen or fail to improve.   I discussed the assessment and treatment plan with the patient. The patient was provided an opportunity to ask questions and all were answered. The patient agreed with the plan and demonstrated an understanding of the instructions.   The patient was advised to call back or seek an in-person evaluation if the symptoms worsen or if the condition fails to improve as anticipated.    Dale Lehi, MD

## 2023-08-03 NOTE — Telephone Encounter (Signed)
Rx ok'd for albuterol inhaler.

## 2023-08-05 ENCOUNTER — Encounter: Payer: Self-pay | Admitting: Internal Medicine

## 2023-08-05 NOTE — Assessment & Plan Note (Signed)
Recent diagnosis as outlined.  Treated with paxlovid.  Cxr ok.  Treat persistent symptoms as outlined.

## 2023-08-05 NOTE — Assessment & Plan Note (Signed)
No increased acid reflux reported.

## 2023-08-05 NOTE — Assessment & Plan Note (Signed)
Flare as outlined.  Will treat with empiric abx and increased steroids as outlined.  Recent CXR - no acute abnormality.  Continue breztri and nebs as directed.  Cough syrup prn.

## 2023-08-05 NOTE — Assessment & Plan Note (Signed)
With recent diagnosis of covid. Treated with paxlovid and prednisone.  Continue breztri.  Back to taking bid now. Has nebulizer treatments at home.  Continue nebs as discussed.  Has rescue inhaler to use prn.  Will increase prednisone dose and slow taper - 60mg  and decrease by 5mg  until off.  Add zpak as directed for possible overlying infection.  Refilled cough medication.  Pulse ox 97%.  Follow closely.  If any worsening change or problems, she is to be evaluated.

## 2023-08-08 NOTE — Telephone Encounter (Signed)
LMTCB

## 2023-08-08 NOTE — Telephone Encounter (Signed)
See my chart message

## 2023-08-08 NOTE — Telephone Encounter (Signed)
LM for patient to discuss. Please transfer to me when she returns call.

## 2023-08-08 NOTE — Telephone Encounter (Signed)
Pt returning call

## 2023-08-10 ENCOUNTER — Other Ambulatory Visit: Payer: Self-pay | Admitting: Internal Medicine

## 2023-08-10 MED ORDER — ONDANSETRON 4 MG PO TBDP: 4.0000 mg | ORAL_TABLET | Freq: Two times a day (BID) | ORAL | 0 refills | Status: DC | PRN

## 2023-08-10 NOTE — Telephone Encounter (Signed)
I have sent a letter and printed - work note to return to work Monday 08/20/23. Please notify her that she needs a f/u with pulmonary as well given persistent symptoms.

## 2023-08-13 ENCOUNTER — Ambulatory Visit (INDEPENDENT_AMBULATORY_CARE_PROVIDER_SITE_OTHER): Payer: BC Managed Care – PPO

## 2023-08-13 DIAGNOSIS — E538 Deficiency of other specified B group vitamins: Secondary | ICD-10-CM | POA: Diagnosis not present

## 2023-08-13 MED ORDER — CYANOCOBALAMIN 1000 MCG/ML IJ SOLN
1000.0000 ug | Freq: Once | INTRAMUSCULAR | Status: AC
  Administered 2023-08-13: 1000 ug via INTRAMUSCULAR

## 2023-08-13 NOTE — Progress Notes (Signed)
Pt presented for their vitamin B12 injection. Pt was identified through two identifiers. Pt tolerated shot well in their left  deltoid.  

## 2023-08-14 NOTE — Telephone Encounter (Signed)
Reviewed. Please call her and see what is needed now - before her appt.

## 2023-08-15 ENCOUNTER — Telehealth: Payer: Self-pay | Admitting: Internal Medicine

## 2023-08-15 NOTE — Telephone Encounter (Signed)
See my chart messages. Paperwork placed with other forms to complete

## 2023-08-15 NOTE — Telephone Encounter (Signed)
Noted.  If she does not need anything now, will await pulmonary appt for reevaluation.

## 2023-08-15 NOTE — Telephone Encounter (Signed)
Patient dropped off FMLA and other paper to be completed by Dr Carron Curie. Envelope is up front in Dr [employer] color folder.

## 2023-08-15 NOTE — Telephone Encounter (Signed)
Spoke with patient. There are 2 sets of paperwork. One is to renew her yearly FMLA. The other is for the continuous leave 11/4-11/25 due to sickness. Patient says that her job requires her to be on the phone continuously for 10 hrs and she does not think that she will be able to complete a shift even after 11/25 because her breathing is not back to baseline. She has appt with pulmonary on 11/22 and f/u appt with [place] 12/5.

## 2023-08-16 ENCOUNTER — Ambulatory Visit: Payer: BC Managed Care – PPO

## 2023-08-17 ENCOUNTER — Encounter: Payer: Self-pay | Admitting: Pulmonary Disease

## 2023-08-17 ENCOUNTER — Ambulatory Visit (INDEPENDENT_AMBULATORY_CARE_PROVIDER_SITE_OTHER): Payer: BC Managed Care – PPO | Admitting: Pulmonary Disease

## 2023-08-17 ENCOUNTER — Ambulatory Visit: Payer: BC Managed Care – PPO | Admitting: Adult Health

## 2023-08-17 VITALS — BP 140/90 | HR 101 | Temp 97.6°F | Ht 66.5 in | Wt 179.0 lb

## 2023-08-17 DIAGNOSIS — J019 Acute sinusitis, unspecified: Secondary | ICD-10-CM

## 2023-08-17 DIAGNOSIS — J4550 Severe persistent asthma, uncomplicated: Secondary | ICD-10-CM

## 2023-08-17 DIAGNOSIS — Z8616 Personal history of COVID-19: Secondary | ICD-10-CM | POA: Diagnosis not present

## 2023-08-17 MED ORDER — IPRATROPIUM BROMIDE 0.03 % NA SOLN: 2.0000 | Freq: Two times a day (BID) | NASAL | Status: DC

## 2023-08-17 NOTE — Telephone Encounter (Signed)
Discussed with patient. Sent letter to remain out of work until 08/27/23. Patient is going to call with update early next week and go to ED is symptoms do not improve or if they worsen.

## 2023-08-17 NOTE — Progress Notes (Signed)
Synopsis: Referred in by Dale Escambia, MD   Subjective:   PATIENT ID: Heather [name] GENDER: female DOB: [birth year], MRN: [id number]  Chief Complaint  Patient presents with   Follow-up    Cough, shortness of breath and wheezing.     HPI Expand All Collapse All    @Patient  ID: Heather [name], female    DOB: 1976/07/25, 47 y.o.   MRN: [id number]      Chief Complaint  Patient presents with   Follow-up      Referring provider: Dale Dublin, MD   HPI: 47 year old female former smoker followed for moderate persistent asthma Medical history significant for long-haul COVID 2021 Medical history significant for seizure disorder, MGUS, IBS, anxiety, ADHD GERD   TEST/EVENTS :  December 2021 IgE 55, allergy panel positive for dust, dog and cat dander, grass and trees, absolute eosinophil count 100    PFT 04/2022 Pending    Chest x-ray January 2024 clear lungs Chest ray May 19, 2022 clear lungs   07/13/2023 Follow up ; Asthma  Patient presents for 1 year follow-up.  Patient has moderate persistent asthma.  Patient remains on Breztri inhaler twice daily.  She remains on Zyrtec and Singulair daily since last visit patient says her breathing has been doing better with less cough and wheezing . 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  Patient was set up for PFTs last visit but unfortunately have not been completed. No increased albuterol /neb use .  Does complain that Breztri copay is too high at $40. We looked at copay cards to see if this may help. She will reach out to her insurance to see if better options on formulary .  Complains of 3-4 weeks of sinus congestion , drainage, scratchy throat, sinus pressure /teeth pain. Has been using OTC sinus meds without much help. Taking Zyrtec , Claritin and Flonase. Has not been taking Singulair .      08/17/2023 Follow up, Asthma, post covid bronchitis.   She went to the ED on 11/04 with cough shortness of breath chest  tightness and wheezing and was found to have COVID. CXR without any abnormalities As such she was discharged on prednisone for few days. She comopleted 2 rounds of steroids however her cough is persistent associated with difficulty conversing.   ROS All systems were reviewed and are negative except for the above.  Objective:   Vitals:   08/17/23 0911 08/17/23 0914  BP: (!) 146/90 (!) 140/90  Pulse: (!) 101   Temp: 97.6 F (36.4 C)   TempSrc: Temporal   SpO2: 100%   Weight: 179 lb (81.2 kg)   Height: 5' 6.5" (1.689 m)    100% on RA BMI Readings from Last 3 Encounters:  08/17/23 28.46 kg/m  07/30/23 29.41 kg/m  07/30/23 27.82 kg/m   Wt Readings from Last 3 Encounters:  08/17/23 179 lb (81.2 kg)  07/30/23 185 lb (83.9 kg)  07/30/23 175 lb (79.4 kg)    Physical Exam GEN: NAD, coughing consistently with audible wheezing HEENT: Supple Neck, Reactive Pupils, EOMI  CVS: Normal S1, Normal S2, RRR, No murmurs or ES appreciated  Lungs: Clear bilateral air entry.  Abdomen: Soft, non tender, non distended, + BS  Extremities: Warm and well perfused, No edema  Skin: No suspicious lesions appreciated  Psych: Normal Affect  Ancillary Information   CBC    Component Value Date/Time   WBC 15.8 (H) 02/08/2023 1058   RBC 4.75 02/08/2023 1058   HGB 13.1  02/08/2023 1058   HGB 13.2 06/19/2014 1437   HCT 40.1 02/08/2023 1058   HCT 40.7 06/19/2014 1437   PLT 353 02/08/2023 1058   PLT 185 06/19/2014 1437   MCV 84.4 02/08/2023 1058   MCV 87 06/19/2014 1437   MCH 27.6 02/08/2023 1058   MCHC 32.7 02/08/2023 1058   RDW 13.5 02/08/2023 1058   RDW 14.2 06/19/2014 1437   LYMPHSABS 3.1 02/08/2023 1058   MONOABS 0.9 02/08/2023 1058   EOSABS 0.3 02/08/2023 1058   BASOSABS 0.1 02/08/2023 1058        No data to display           Assessment & Plan:   Moderate Persistent Asthma  Post COVID bronchitis. Acute sinusitis  Relayed that this is acute bronchitis and will take time to  improve around 3 to 4 weeks. Plan as below.    Plan  Patient Instructions   Breztri 2 puffs Twice a day. Can use as needed during the day as well during this flare. Continue on Zyrtec.  C/w Singulair 10mg  At bedtime  Saline nasal rinses  Twice daily    Flonase nasal 2 puffs BID and Ipratropium Nasal spray 1 spray each nostril bid. Albuterol inhaler or Duoneb every 6hr as needed   PFTs once this episode improves.     Return in about 4 weeks (around 09/14/2023).  I spent 60 minutes caring for this patient today, including preparing to see the patient, obtaining a medical history , reviewing a separately obtained history, performing a medically appropriate examination and/or evaluation, counseling and educating the patient/family/caregiver, ordering medications, tests, or procedures, documenting clinical information in the electronic health record, and independently interpreting results (not separately reported/billed) and communicating results to the patient/family/caregiver  Janann Colonel, MD [facility] 08/17/2023 9:51 AM

## 2023-08-21 ENCOUNTER — Encounter: Payer: Self-pay | Admitting: Pulmonary Disease

## 2023-08-21 MED ORDER — IPRATROPIUM BROMIDE 0.03 % NA SOLN: 2.0000 | Freq: Two times a day (BID) | NASAL | 12 refills | Status: AC

## 2023-08-22 NOTE — Telephone Encounter (Signed)
It appears she saw pulmonary.  Confirm if they scheduled her for f/u.  Ok to extent out her time out of work, but she needs a f/u appt scheduled with me so that I can reevaluate and see if we can help determine her return to work date. Ok to extend out of work until next week (or until f/u appt with me next week)

## 2023-08-30 ENCOUNTER — Ambulatory Visit (INDEPENDENT_AMBULATORY_CARE_PROVIDER_SITE_OTHER): Payer: BC Managed Care – PPO | Admitting: Internal Medicine

## 2023-08-30 ENCOUNTER — Encounter: Payer: Self-pay | Admitting: Internal Medicine

## 2023-08-30 VITALS — BP 136/76 | HR 110 | Temp 98.2°F | Resp 16 | Ht 66.5 in | Wt 177.0 lb

## 2023-08-30 DIAGNOSIS — E538 Deficiency of other specified B group vitamins: Secondary | ICD-10-CM | POA: Diagnosis not present

## 2023-08-30 DIAGNOSIS — E7800 Pure hypercholesterolemia, unspecified: Secondary | ICD-10-CM | POA: Diagnosis not present

## 2023-08-30 DIAGNOSIS — K219 Gastro-esophageal reflux disease without esophagitis: Secondary | ICD-10-CM

## 2023-08-30 DIAGNOSIS — G40909 Epilepsy, unspecified, not intractable, without status epilepticus: Secondary | ICD-10-CM

## 2023-08-30 DIAGNOSIS — J4541 Moderate persistent asthma with (acute) exacerbation: Secondary | ICD-10-CM | POA: Diagnosis not present

## 2023-08-30 DIAGNOSIS — D472 Monoclonal gammopathy: Secondary | ICD-10-CM

## 2023-08-30 DIAGNOSIS — Z0289 Encounter for other administrative examinations: Secondary | ICD-10-CM

## 2023-08-30 MED ORDER — CYANOCOBALAMIN 1000 MCG/ML IJ SOLN
1000.0000 ug | Freq: Once | INTRAMUSCULAR | Status: AC
  Administered 2023-08-30: 1000 ug via INTRAMUSCULAR

## 2023-08-30 NOTE — Progress Notes (Signed)
Patient presented for B 12 injection to left deltoid, patient voiced no concerns nor showed any signs of distress during injection. 

## 2023-08-30 NOTE — Progress Notes (Signed)
Subjective:    Patient ID: Heather [name], female    DOB: May 31, 1976, 47 y.o.   MRN: [id number]  Patient here for  Chief Complaint  Patient presents with   Medical Management of Chronic Issues    HPI Here for a scheduled follow up - f/u regarding persistent cough, asthma.  Saw pulmonary 08/17/23. Went to ED 07/30/23 - cough and sob with chest tightness and wheezing and found to have covid.  CXR ok. Discharged on prednisone. Had f/u with pulmonary as outlined. Felt to be acute bronchitis. Recommended breztri, zyrtec, singulair, saline nasal rinses, flonase and albuterol. Overall her breathing is better. Still with increased coughing when attempting to talk. She remains out of work.  She has to do increased talking with her job and has been unable to return. Using Breztri tid. Also using her nebulizer 3-4x/day. Has f/u with pulmonary planned towards the end of the month and has f/u in long covid cliniic Christmas Eve. Discussed remaining out of work until after these appts. No increased acid reflux reported.    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 hauler    Depression    bipolar   Dysrhythmia    Fainting spell    H/O   H/O cardiac arrhythmia    Migraines    Seizures (HCC)    Past Surgical History:  Procedure Laterality Date   COLONOSCOPY WITH PROPOFOL N/A 06/03/2021   Procedure: COLONOSCOPY WITH PROPOFOL;  Surgeon: Pasty Spillers, MD;  Location: [facility] ENDOSCOPY;  Service: Endoscopy;  Laterality: N/A;   ESOPHAGOGASTRODUODENOSCOPY N/A 06/03/2021   Procedure: ESOPHAGOGASTRODUODENOSCOPY (EGD);  Surgeon: Pasty Spillers, MD;  Location: [facility] ENDOSCOPY;  Service: Endoscopy;  Laterality: N/A;   TUBAL LIGATION Bilateral 09/25/2002   tubal reversal     Family History  Problem Relation Age of Onset   Hypertension Mother    Diabetes Mother    Arthritis Mother    Heart disease Mother    Breast cancer Neg Hx    Social History   Socioeconomic History    Marital status: Married    Spouse name: Not on file   Number of children: Not on file   Years of education: Not on file   Highest education level: Not on file  Occupational History   Not on file  Tobacco Use   Smoking status: Former    Current packs/day: 0.00    Average packs/day: 0.5 packs/day for 5.0 years (2.5 ttl pk-yrs)    Types: Cigarettes    Start date: 2008    Quit date: 2013    Years since quitting: 11.9   Smokeless tobacco: Never   Tobacco comments:    Quit mid October 2014  Vaping Use   Vaping status: Never Used  Substance and Sexual Activity   Alcohol use: No    Alcohol/week: 0.0 standard drinks of alcohol   Drug use: No   Sexual activity: Yes    Birth control/protection: None  Other Topics Concern   Not on file  Social History Narrative   Not on file   Social Determinants of Health   Financial Resource Strain: Not on file  Food Insecurity: Not on file  Transportation Needs: Not on file  Physical Activity: Not on file  Stress: Not on file  Social Connections: Not on file     Review of Systems  Constitutional:  Positive for fatigue. Negative for appetite change and unexpected weight change.  HENT:  Negative for  sinus pressure.   Respiratory:  Positive for cough.        Increased cough.  Breathing overall has improved. Some wheezing.   Cardiovascular:  Negative for chest pain and palpitations.  Gastrointestinal:  Negative for abdominal pain, diarrhea, nausea and vomiting.  Genitourinary:  Negative for difficulty urinating and dysuria.  Musculoskeletal:  Negative for joint swelling and myalgias.  Skin:  Negative for color change and rash.  Neurological:  Negative for dizziness and headaches.  Psychiatric/Behavioral:  Negative for agitation and dysphoric mood.        Objective:     BP 136/76   Pulse (!) 110   Temp 98.2 F (36.8 C)   Resp 16   Ht 5' 6.5" (1.689 m)   Wt 177 lb (80.3 kg)   SpO2 98%   BMI 28.14 kg/m  Wt Readings from Last 3  Encounters:  08/30/23 177 lb (80.3 kg)  08/17/23 179 lb (81.2 kg)  07/30/23 185 lb (83.9 kg)    Physical Exam Vitals reviewed.  Constitutional:      General: She is not in acute distress.    Appearance: Normal appearance.  HENT:     Head: Normocephalic and atraumatic.     Right Ear: External ear normal.     Left Ear: External ear normal.     Mouth/Throat:     Pharynx: No oropharyngeal exudate or posterior oropharyngeal erythema.  Eyes:     General: No scleral icterus.       Right eye: No discharge.        Left eye: No discharge.     Conjunctiva/sclera: Conjunctivae normal.  Neck:     Thyroid: No thyromegaly.  Cardiovascular:     Rate and Rhythm: Normal rate and regular rhythm.  Pulmonary:     Effort: No respiratory distress.     Comments: Increased air movement. Increased cough with increased talking.  Abdominal:     General: Bowel sounds are normal.     Palpations: Abdomen is soft.     Tenderness: There is no abdominal tenderness.  Musculoskeletal:        General: No swelling or tenderness.     Cervical back: Neck supple. No tenderness.  Lymphadenopathy:     Cervical: No cervical adenopathy.  Skin:    Findings: No erythema or rash.  Neurological:     Mental Status: [name].  Psychiatric:        Mood and Affect: Mood normal.        Behavior: Behavior normal.      Outpatient Encounter Medications as of 08/30/2023  Medication Sig   Acetylcysteine 600 MG TBCR Take by mouth.   AJOVY 225 MG/1.5ML SOAJ Inject into the skin.   albuterol (VENTOLIN HFA) 108 (90 Base) MCG/ACT inhaler INHALE 1 TO 2 PUFFS BY MOUTH EVERY 4 HOURS AS NEEDED FOR WHEEZING FOR SHORTNESS OF BREATH   amphetamine-dextroamphetamine (ADDERALL XR) 20 MG 24 hr capsule Take 20 mg by mouth every morning.   amphetamine-dextroamphetamine (ADDERALL) 20 MG tablet Take 20 mg by mouth daily.   ascorbic acid (VITAMIN C) 1000 MG tablet Take by mouth.   BREZTRI AEROSPHERE 160-9-4.8 MCG/ACT AERO INHALE 2  PUFFS IN THE MORNING AND AT BEDTIME   cetirizine (ZYRTEC) 10 MG tablet Take 1 tablet by mouth once daily   cyanocobalamin (VITAMIN B12) 1000 MCG/ML injection Inject into the muscle.   diazepam (VALIUM) 10 MG tablet Take 1 tablet by mouth 4 (four) times daily as needed.   divalproex (  DEPAKOTE) 500 MG DR tablet Take by mouth.   fluticasone (FLONASE) 50 MCG/ACT nasal spray Place 1 spray into both nostrils daily.   Galcanezumab-gnlm 120 MG/ML SOAJ Inject into the skin.   guaiFENesin (MUCINEX) 600 MG 12 hr tablet Take 2 tablets (1,200 mg total) by mouth 2 (two) times daily as needed.   ipratropium (ATROVENT) 0.03 % nasal spray Place 2 sprays into both nostrils every 12 (twelve) hours.   ipratropium-albuterol (DUONEB) 0.5-2.5 (3) MG/3ML SOLN Inhale 3 mLs into the lungs every 4 (four) hours as needed.   loratadine (CLARITIN) 10 MG tablet Take 10 mg by mouth daily.   magnesium oxide (MAG-OX) 400 MG tablet Take 1 tablet (400 mg total) by mouth daily.   montelukast (SINGULAIR) 10 MG tablet Take 1 tablet (10 mg total) by mouth at bedtime.   omeprazole (PRILOSEC) 40 MG capsule Take 1 capsule (40 mg total) by mouth daily before breakfast.   ondansetron (ZOFRAN-ODT) 4 MG disintegrating tablet Take 1 tablet (4 mg total) by mouth 2 (two) times daily as needed for nausea or vomiting.   Rimegepant Sulfate (NURTEC) 75 MG TBDP Take by mouth.   UBRELVY 100 MG TABS Take by mouth.   [DISCONTINUED] aspirin-acetaminophen-caffeine (EXCEDRIN MIGRAINE) 250-250-65 MG tablet Take 1 tablet by mouth every 6 (six) hours as needed. (Patient not taking: Reported on 08/17/2023)   [DISCONTINUED] benzonatate (TESSALON PERLES) 100 MG capsule Take 1 capsule (100 mg total) by mouth 3 (three) times daily as needed for cough.   [DISCONTINUED] guaiFENesin-codeine 100-10 MG/5ML syrup Take 5 mLs by mouth every 6 (six) hours as needed.   [EXPIRED] cyanocobalamin (VITAMIN B12) injection 1,000 mcg    No facility-administered encounter  medications on file as of 08/30/2023.     Lab Results  Component Value Date   WBC 13.6 (H) 08/31/2023   HGB 14.3 08/31/2023   HCT 42.8 08/31/2023   PLT 327 08/31/2023   GLUCOSE 127 (H) 08/31/2023   CHOL 207 (H) 07/10/2022   TRIG 350.0 (H) 07/10/2022   HDL 36.60 (L) 07/10/2022   LDLDIRECT 136.0 07/10/2022   LDLCALC 134 (H) 08/29/2021   ALT 14 08/31/2023   AST 19 08/31/2023   NA 140 08/31/2023   K 2.9 (L) 08/31/2023   CL 104 08/31/2023   CREATININE 0.79 08/31/2023   BUN 5 (L) 08/31/2023   CO2 24 08/31/2023   TSH 1.47 08/29/2021    DG Chest 2 View  Result Date: 07/30/2023 CLINICAL DATA:  Shortness of breath, COVID positive EXAM: CHEST - 2 VIEW COMPARISON:  10/13/2022 FINDINGS: Cardiac and mediastinal contours are within normal limits. No focal pulmonary opacity. No pleural effusion or pneumothorax. No acute osseous abnormality. IMPRESSION: No acute cardiopulmonary process. Electronically Signed   By: Wiliam Ke M.D.   On: 07/30/2023 14:29       Assessment & Plan:  B12 deficiency -     Cyanocobalamin  Moderate persistent asthma with acute exacerbation Assessment & Plan:  Saw pulmonary 08/17/23. Went to ED 07/30/23 - cough and sob with chest tightness and wheezing and found to have covid.  CXR ok. Discharged on prednisone. Had f/u with pulmonary as outlined. Felt to be acute bronchitis. Recommended breztri, zyrtec, singulair, saline nasal rinses, flonase and albuterol. Overall her breathing is better. Still with increased coughing when attempting to talk. She remains out of work.  She has to do increased talking with her job and has been unable to return. Using Breztri tid. Also using her nebulizer 3-4x/day. Has f/u with pulmonary  planned towards the end of the month and has f/u in long covid cliniic Christmas Eve.    Gastroesophageal reflux disease, unspecified whether esophagitis present Assessment & Plan: No increased acid reflux reported.     Hypercholesterolemia Assessment & Plan: Low cholesterol diet and exercise.  Follow lipid panel.    MGUS (monoclonal gammopathy of unknown significance) Assessment & Plan: Seeing Dr Cathie Hoops for f/u MGUS. Evaluated 02/23/23.  Stable.  Recommended check SPEP and light chain ratio - 6 months.   Seizure disorder [facility]) Assessment & Plan: Being followed by neurology.     Encounter for completion of form with patient Assessment & Plan: The form for intermittent FMLA completed.  Discussed form for her current situation - out of work since beginning of 07/2023.  Given she is unable to talk without increased cough, she will remain out of work.  Her job requires her to talk. (Be able to talk to customers). Will remain out until after her pulmonary and long covid f/u.       [place], MD

## 2023-08-31 ENCOUNTER — Inpatient Hospital Stay: Payer: BC Managed Care – PPO | Attending: Oncology

## 2023-08-31 DIAGNOSIS — Z87891 Personal history of nicotine dependence: Secondary | ICD-10-CM | POA: Insufficient documentation

## 2023-08-31 DIAGNOSIS — G40909 Epilepsy, unspecified, not intractable, without status epilepticus: Secondary | ICD-10-CM | POA: Insufficient documentation

## 2023-08-31 DIAGNOSIS — E876 Hypokalemia: Secondary | ICD-10-CM | POA: Diagnosis not present

## 2023-08-31 DIAGNOSIS — D72829 Elevated white blood cell count, unspecified: Secondary | ICD-10-CM | POA: Diagnosis not present

## 2023-08-31 DIAGNOSIS — D472 Monoclonal gammopathy: Secondary | ICD-10-CM | POA: Insufficient documentation

## 2023-08-31 LAB — CMP (CANCER CENTER ONLY)
ALT: 14 U/L (ref 0–44)
AST: 19 U/L (ref 15–41)
Albumin: 4 g/dL (ref 3.5–5.0)
Alkaline Phosphatase: 135 U/L — ABNORMAL HIGH (ref 38–126)
Anion gap: 12 (ref 5–15)
BUN: 5 mg/dL — ABNORMAL LOW (ref 6–20)
CO2: 24 mmol/L (ref 22–32)
Calcium: 9.2 mg/dL (ref 8.9–10.3)
Chloride: 104 mmol/L (ref 98–111)
Creatinine: 0.79 mg/dL (ref 0.44–1.00)
GFR, Estimated: >60 mL/min (ref >60)
Glucose, Bld: 127 mg/dL — ABNORMAL HIGH (ref 70–99)
Potassium: 2.9 mmol/L — ABNORMAL LOW (ref 3.5–5.1)
Sodium: 140 mmol/L (ref 135–145)
Total Bilirubin: 0.5 mg/dL (ref <1.2)
Total Protein: 6.9 g/dL (ref 6.5–8.1)

## 2023-08-31 LAB — CBC WITH DIFFERENTIAL (CANCER CENTER ONLY)
Abs Immature Granulocytes: 0.09 10*3/uL — ABNORMAL HIGH (ref 0.00–0.07)
Basophils Absolute: 0.1 10*3/uL (ref 0.0–0.1)
Basophils Relative: 0 %
Eosinophils Absolute: 0.1 10*3/uL (ref 0.0–0.5)
Eosinophils Relative: 1 %
HCT: 42.8 % (ref 36.0–46.0)
Hemoglobin: 14.3 g/dL (ref 12.0–15.0)
Immature Granulocytes: 1 %
Lymphocytes Relative: 16 %
Lymphs Abs: 2.2 10*3/uL (ref 0.7–4.0)
MCH: 27.6 pg (ref 26.0–34.0)
MCHC: 33.4 g/dL (ref 30.0–36.0)
MCV: 82.5 fL (ref 80.0–100.0)
Monocytes Absolute: 0.7 10*3/uL (ref 0.1–1.0)
Monocytes Relative: 5 %
Neutro Abs: 10.4 10*3/uL — ABNORMAL HIGH (ref 1.7–7.7)
Neutrophils Relative %: 77 %
Platelet Count: 327 10*3/uL (ref 150–400)
RBC: 5.19 MIL/uL — ABNORMAL HIGH (ref 3.87–5.11)
RDW: 14.4 % (ref 11.5–15.5)
WBC Count: 13.6 10*3/uL — ABNORMAL HIGH (ref 4.0–10.5)
nRBC: 0 % (ref 0.0–0.2)

## 2023-09-02 ENCOUNTER — Encounter: Payer: Self-pay | Admitting: Internal Medicine

## 2023-09-02 NOTE — Assessment & Plan Note (Signed)
The form for intermittent FMLA completed.  Discussed form for her current situation - out of work since beginning of 07/2023.  Given she is unable to talk without increased cough, she will remain out of work.  Her job requires her to talk. (Be able to talk to customers). Will remain out until after her pulmonary and long covid f/u.

## 2023-09-02 NOTE — Assessment & Plan Note (Signed)
Being followed by neurology.   

## 2023-09-02 NOTE — Assessment & Plan Note (Signed)
Low cholesterol diet and exercise.  Follow lipid panel.   

## 2023-09-02 NOTE — Assessment & Plan Note (Signed)
Seeing Dr Cathie Hoops for f/u MGUS. Evaluated 02/23/23.  Stable.  Recommended check SPEP and light chain ratio - 6 months.

## 2023-09-02 NOTE — Assessment & Plan Note (Signed)
No increased acid reflux reported.

## 2023-09-02 NOTE — Assessment & Plan Note (Signed)
Saw pulmonary 08/17/23. Went to ED 07/30/23 - cough and sob with chest tightness and wheezing and found to have covid.  CXR ok. Discharged on prednisone. Had f/u with pulmonary as outlined. Felt to be acute bronchitis. Recommended breztri, zyrtec, singulair, saline nasal rinses, flonase and albuterol. Overall her breathing is better. Still with increased coughing when attempting to talk. She remains out of work.  She has to do increased talking with her job and has been unable to return. Using Breztri tid. Also using her nebulizer 3-4x/day. Has f/u with pulmonary planned towards the end of the month and has f/u in long covid cliniic Christmas Eve.

## 2023-09-03 ENCOUNTER — Telehealth: Payer: Self-pay | Admitting: Internal Medicine

## 2023-09-03 LAB — KAPPA/LAMBDA LIGHT CHAINS
Kappa free light chain: 7.7 mg/L (ref 3.3–19.4)
Kappa, lambda light chain ratio: 1.13 (ref 0.26–1.65)
Lambda free light chains: 6.8 mg/L (ref 5.7–26.3)

## 2023-09-03 NOTE — Telephone Encounter (Signed)
Opened in error

## 2023-09-03 NOTE — Telephone Encounter (Signed)
Please call her and let her know, paperwork will be sent.  Also, I did discuss with Dr Larinda Buttery (pulmonary) and he recommended azithromycin.  5 days.  This is the abx (in Zpak).  If agreeable, will send in rx

## 2023-09-04 ENCOUNTER — Other Ambulatory Visit: Payer: Self-pay | Admitting: Oncology

## 2023-09-04 MED ORDER — AZITHROMYCIN 250 MG PO TABS: ORAL_TABLET | ORAL | 0 refills | Status: AC

## 2023-09-04 MED ORDER — POTASSIUM CHLORIDE CRYS ER 20 MEQ PO TBCR: 20.0000 meq | EXTENDED_RELEASE_TABLET | Freq: Two times a day (BID) | ORAL | 0 refills | Status: DC

## 2023-09-04 NOTE — Telephone Encounter (Signed)
Please let her know that I talked with pulmonary about erythromycin and he recommended azithromycin.  Rx sent in to pharmacy.

## 2023-09-04 NOTE — Telephone Encounter (Signed)
Pt aware and said that she thought the erythromycin is what was discussed but she is agreeable to azithromycin as well.

## 2023-09-04 NOTE — Addendum Note (Signed)
Addended by: Charm Barges on: 09/04/2023 07:44 PM   Modules accepted: Orders

## 2023-09-05 ENCOUNTER — Telehealth: Payer: Self-pay

## 2023-09-05 DIAGNOSIS — E876 Hypokalemia: Secondary | ICD-10-CM

## 2023-09-05 NOTE — Telephone Encounter (Signed)
Called pt, no answer. Detailed VM left and Mychart message sent.   Please add lab on 12/20. I did leave message telling her to be earlier than 10:30 for labs.

## 2023-09-05 NOTE — Telephone Encounter (Signed)
-----   Message from Rickard Patience sent at 09/04/2023  8:52 PM EST ----- Potassium is low recommend her to take potassium chloride BID for 1 week  When she sees me, please add repeat lab - BMP thanks.  Rx sent.

## 2023-09-06 ENCOUNTER — Telehealth: Payer: Self-pay

## 2023-09-06 LAB — MULTIPLE MYELOMA PANEL, SERUM
Albumin SerPl Elph-Mcnc: 3.7 g/dL (ref 2.9–4.4)
Albumin/Glob SerPl: 1.4 (ref 0.7–1.7)
Alpha 1: 0.3 g/dL (ref 0.0–0.4)
Alpha2 Glob SerPl Elph-Mcnc: 0.9 g/dL (ref 0.4–1.0)
B-Globulin SerPl Elph-Mcnc: 1 g/dL (ref 0.7–1.3)
Gamma Glob SerPl Elph-Mcnc: 0.5 g/dL (ref 0.4–1.8)
Globulin, Total: 2.7 g/dL (ref 2.2–3.9)
IgA: 94 mg/dL (ref 87–352)
IgG (Immunoglobin G), Serum: 573 mg/dL — ABNORMAL LOW (ref 586–1602)
IgM (Immunoglobulin M), Srm: 54 mg/dL (ref 26–217)
Total Protein ELP: 6.4 g/dL (ref 6.0–8.5)

## 2023-09-06 NOTE — Telephone Encounter (Signed)
Pt has been informed to pick up copy and that items have been faxed

## 2023-09-06 NOTE — Telephone Encounter (Signed)
Work certificate forms have been faxed to the given fax number [phone]  with a completed transmission log    Called pt and she stated that she would like to come pick up the original copy, forms have been placed in an envelope and placed in the designated pick up area

## 2023-09-12 ENCOUNTER — Ambulatory Visit: Payer: BC Managed Care – PPO

## 2023-09-12 DIAGNOSIS — E538 Deficiency of other specified B group vitamins: Secondary | ICD-10-CM

## 2023-09-12 MED ORDER — CYANOCOBALAMIN 1000 MCG/ML IJ SOLN
1000.0000 ug | Freq: Once | INTRAMUSCULAR | Status: AC
  Administered 2023-09-12: 1000 ug via INTRAMUSCULAR

## 2023-09-12 NOTE — Progress Notes (Signed)
Patient presented for B 12 injection to left deltoid, patient voiced no concerns nor showed any signs of distress during injection. 

## 2023-09-14 ENCOUNTER — Inpatient Hospital Stay: Payer: BC Managed Care – PPO

## 2023-09-14 ENCOUNTER — Inpatient Hospital Stay (HOSPITAL_BASED_OUTPATIENT_CLINIC_OR_DEPARTMENT_OTHER): Payer: BC Managed Care – PPO | Admitting: Oncology

## 2023-09-14 ENCOUNTER — Encounter: Payer: Self-pay | Admitting: Pulmonary Disease

## 2023-09-14 ENCOUNTER — Encounter: Payer: Self-pay | Admitting: Oncology

## 2023-09-14 ENCOUNTER — Ambulatory Visit: Payer: BC Managed Care – PPO | Admitting: Pulmonary Disease

## 2023-09-14 VITALS — BP 165/111 | HR 94 | Temp 97.4°F | Resp 18 | Wt 183.0 lb

## 2023-09-14 VITALS — BP 138/82 | HR 100 | Temp 97.8°F | Ht 66.5 in | Wt 184.6 lb

## 2023-09-14 DIAGNOSIS — J4540 Moderate persistent asthma, uncomplicated: Secondary | ICD-10-CM | POA: Diagnosis not present

## 2023-09-14 DIAGNOSIS — J4550 Severe persistent asthma, uncomplicated: Secondary | ICD-10-CM

## 2023-09-14 DIAGNOSIS — D72829 Elevated white blood cell count, unspecified: Secondary | ICD-10-CM | POA: Diagnosis not present

## 2023-09-14 DIAGNOSIS — D472 Monoclonal gammopathy: Secondary | ICD-10-CM

## 2023-09-14 DIAGNOSIS — J40 Bronchitis, not specified as acute or chronic: Secondary | ICD-10-CM | POA: Diagnosis not present

## 2023-09-14 DIAGNOSIS — E876 Hypokalemia: Secondary | ICD-10-CM | POA: Diagnosis not present

## 2023-09-14 DIAGNOSIS — Z8616 Personal history of COVID-19: Secondary | ICD-10-CM

## 2023-09-14 LAB — BASIC METABOLIC PANEL - CANCER CENTER ONLY
Anion gap: 11 (ref 5–15)
BUN: 11 mg/dL (ref 6–20)
CO2: 24 mmol/L (ref 22–32)
Calcium: 9.4 mg/dL (ref 8.9–10.3)
Chloride: 103 mmol/L (ref 98–111)
Creatinine: 0.74 mg/dL (ref 0.44–1.00)
GFR, Estimated: >60 mL/min (ref >60)
Glucose, Bld: 102 mg/dL — ABNORMAL HIGH (ref 70–99)
Potassium: 3.7 mmol/L (ref 3.5–5.1)
Sodium: 138 mmol/L (ref 135–145)

## 2023-09-14 MED ORDER — PREDNISONE 20 MG PO TABS: 40.0000 mg | ORAL_TABLET | Freq: Every day | ORAL | 0 refills | Status: AC

## 2023-09-14 MED ORDER — FLUTICASONE-SALMETEROL 500-50 MCG/ACT IN AEPB: 1.0000 | INHALATION_SPRAY | Freq: Two times a day (BID) | RESPIRATORY_TRACT | 3 refills | Status: DC

## 2023-09-14 MED ORDER — AIRSUPRA 90-80 MCG/ACT IN AERO: 2.0000 | INHALATION_SPRAY | Freq: Four times a day (QID) | RESPIRATORY_TRACT | 3 refills | Status: DC

## 2023-09-14 NOTE — Assessment & Plan Note (Addendum)
IgA MGUS  Lab Results  Component Value Date   MPROTEIN Not Observed 08/31/2023   KAPLAMBRATIO 1.13 08/31/2023    observation.  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 Check SPEP and light chain ratio in 1 year

## 2023-09-14 NOTE — Assessment & Plan Note (Signed)
Status post a course of potassium supplementation.  Repeat BMP showed normalized potassium level.

## 2023-09-14 NOTE — Progress Notes (Signed)
Hematology/Oncology Consult note [phone] [phone]     Patient Care Team: Dale Monetta, MD as PCP - General (Internal Medicine) Rickard Patience, MD as Consulting Physician (Oncology)  ASSESSMENT & PLAN  MGUS (monoclonal gammopathy of unknown significance) IgA MGUS  Lab Results  Component Value Date   MPROTEIN Not Observed 08/31/2023   KAPLAMBRATIO 1.13 08/31/2023    observation.  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 Check SPEP and light chain ratio in 1 year  Leukocytosis Chronic,likely reactive due to chronic inflammation, and long term steroid inhaler usage Peripheral flow cytometry showed that the lymphocytosis is due to an increase in CD4+ T cells, likely a reactive process  Observation.   Hypokalemia Status post a course of potassium supplementation.  Repeat BMP showed normalized potassium level.    Orders Placed This Encounter  Procedures   CBC with Differential (Cancer Center Only)    Standing Status:   Future    Expected Date:   09/13/2024    Expiration Date:   09/13/2024   CMP (Cancer Center only)    Standing Status:   Future    Expected Date:   09/13/2024    Expiration Date:   09/13/2024   Kappa/lambda light chains    Standing Status:   Future    Expected Date:   09/13/2024    Expiration Date:   09/13/2024   Multiple Myeloma Panel (SPEP&IFE w/QIG)    Standing Status:   Future    Expected Date:   09/13/2024    Expiration Date:   09/13/2024   Follow-up in 1 year All questions were answered. The patient knows to call the clinic with any problems, questions or concerns.   Rickard Patience, MD, PhD [facility] Hematology Oncology 09/14/2023       CHIEF COMPLAINTS/PURPOSE OF CONSULTATION:  Leukocytosis/elevate white count  HISTORY OF PRESENTING ILLNESS:  Heather [name] 47 y.o. female is here because of elevated WBC.  Patient has chronic leukocytosis predominantly  neutrophilia.  Dated back to 2016. Patient reports history of IBS, asthma, chronic cough, known COVID symptoms. She has asthma flare multiple times per year, this year she has had steroid treatments 4-5 times.  Currently not on steroids.  T patient reports that each time with asthma flare, her white count goes up to 18-19, and if she takes antibiotics, leukocytosis improves.  Patient uses steroid inhaler 2-3 times per day. Patient has a seizure disorder, she is on Depakote, There is not reported symptoms of urinary frequency/urgency or dysuria, diarrhea, or abnormal skin rash.  Endorses chronic hip pain, lower extremity pain.  No hand joints deformity or pain. Denies any unintentional weight loss, night sweats or fever.  She denies smoking currently. She had no prior history or diagnosis of cancer.  Her age appropriate screening programs are up-to-date. The patient has no prior diagnosis of autoimmune disease and was not prescribed corticosteroids related products.  INTERVAL HISTORY Heather [name] is a 47 y.o. female who has above history reviewed by me today presents for follow up visit for leukocytosis. Patient reports acute on chronic cough spells.  She has history of asthma, multiple flares.  Long COVID syndrome.  She follows up with primary care provider and pulmonology, and long COVID clinic.  MEDICAL HISTORY:  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 hauler    Depression    bipolar   Dysrhythmia    Fainting spell  H/O   H/O cardiac arrhythmia    Migraines    Seizures (HCC)     SURGICAL HISTORY: Past Surgical History:  Procedure Laterality Date   COLONOSCOPY WITH PROPOFOL N/A 06/03/2021   Procedure: COLONOSCOPY WITH PROPOFOL;  Surgeon: Pasty Spillers, MD;  Location: [facility] ENDOSCOPY;  Service: Endoscopy;  Laterality: N/A;   ESOPHAGOGASTRODUODENOSCOPY N/A 06/03/2021   Procedure: ESOPHAGOGASTRODUODENOSCOPY (EGD);  Surgeon: Pasty Spillers, MD;   Location: [facility] ENDOSCOPY;  Service: Endoscopy;  Laterality: N/A;   TUBAL LIGATION Bilateral 09/25/2002   tubal reversal      SOCIAL HISTORY: Social History   Socioeconomic History   Marital status: Married    Spouse name: Not on file   Number of children: Not on file   Years of education: Not on file   Highest education level: Not on file  Occupational History   Not on file  Tobacco Use   Smoking status: Former    Current packs/day: 0.00    Average packs/day: 0.5 packs/day for 5.0 years (2.5 ttl pk-yrs)    Types: Cigarettes    Start date: 2008    Quit date: 2013    Years since quitting: 11.9   Smokeless tobacco: Never   Tobacco comments:    Quit mid October 2014  Vaping Use   Vaping status: Never Used  Substance and Sexual Activity   Alcohol use: No    Alcohol/week: 0.0 standard drinks of alcohol   Drug use: No   Sexual activity: Yes    Birth control/protection: None  Other Topics Concern   Not on file  Social History Narrative   Not on file   Social Drivers of Health   Financial Resource Strain: Not on file  Food Insecurity: Not on file  Transportation Needs: Not on file  Physical Activity: Not on file  Stress: Not on file  Social Connections: Not on file  Intimate Partner Violence: Not on file    FAMILY HISTORY: Family History  Problem Relation Age of Onset   Hypertension Mother    Diabetes Mother    Arthritis Mother    Heart disease Mother    Breast cancer Neg Hx     ALLERGIES:  is allergic to levaquin [levofloxacin], tramadol, amoxicillin, augmentin [amoxicillin-pot clavulanate], erythromycin, and lithium.  MEDICATIONS:  Current Outpatient Medications  Medication Sig Dispense Refill   Acetylcysteine 600 MG TBCR Take by mouth.     AJOVY 225 MG/1.5ML SOAJ Inject into the skin.     albuterol (VENTOLIN HFA) 108 (90 Base) MCG/ACT inhaler INHALE 1 TO 2 PUFFS BY MOUTH EVERY 4 HOURS AS NEEDED FOR WHEEZING FOR SHORTNESS OF BREATH 18 g 1    Albuterol-Budesonide (AIRSUPRA) 90-80 MCG/ACT AERO Inhale 2 puffs into the lungs every 6 (six) hours. 1 g 3   amphetamine-dextroamphetamine (ADDERALL XR) 20 MG 24 hr capsule Take 20 mg by mouth every morning.     amphetamine-dextroamphetamine (ADDERALL) 20 MG tablet Take 20 mg by mouth daily.     ascorbic acid (VITAMIN C) 1000 MG tablet Take by mouth.     BREZTRI AEROSPHERE 160-9-4.8 MCG/ACT AERO INHALE 2 PUFFS IN THE MORNING AND AT BEDTIME 11 g 0   cetirizine (ZYRTEC) 10 MG tablet Take 1 tablet by mouth once daily 90 tablet 3   cyanocobalamin (VITAMIN B12) 1000 MCG/ML injection Inject into the muscle.     diazepam (VALIUM) 10 MG tablet Take 1 tablet by mouth 4 (four) times daily as needed.     divalproex (DEPAKOTE) 500 MG  DR tablet Take by mouth.     fluticasone (FLONASE) 50 MCG/ACT nasal spray Place 1 spray into both nostrils daily. 48 g 1   fluticasone-salmeterol (ADVAIR) 500-50 MCG/ACT AEPB Inhale 1 puff into the lungs in the morning and at bedtime. 60 each 3   Galcanezumab-gnlm 120 MG/ML SOAJ Inject into the skin.     guaiFENesin (MUCINEX) 600 MG 12 hr tablet Take 2 tablets (1,200 mg total) by mouth 2 (two) times daily as needed. 60 tablet 1   ipratropium (ATROVENT) 0.03 % nasal spray Place 2 sprays into both nostrils every 12 (twelve) hours. 30 mL 12   ipratropium-albuterol (DUONEB) 0.5-2.5 (3) MG/3ML SOLN Inhale 3 mLs into the lungs every 4 (four) hours as needed. 360 mL 11   loratadine (CLARITIN) 10 MG tablet Take 10 mg by mouth daily.     magnesium oxide (MAG-OX) 400 MG tablet Take 1 tablet (400 mg total) by mouth daily. 30 tablet 2   montelukast (SINGULAIR) 10 MG tablet Take 1 tablet (10 mg total) by mouth at bedtime. 30 tablet 11   omeprazole (PRILOSEC) 40 MG capsule Take 1 capsule (40 mg total) by mouth daily before breakfast. 30 capsule 3   ondansetron (ZOFRAN-ODT) 4 MG disintegrating tablet Take 1 tablet (4 mg total) by mouth 2 (two) times daily as needed for nausea or vomiting. 20  tablet 0   potassium chloride SA (KLOR-CON M) 20 MEQ tablet Take 1 tablet (20 mEq total) by mouth 2 (two) times daily. 14 tablet 0   predniSONE (DELTASONE) 20 MG tablet Take 2 tablets (40 mg total) by mouth daily with breakfast for 5 days. 10 tablet 0   Rimegepant Sulfate (NURTEC) 75 MG TBDP Take by mouth.     UBRELVY 100 MG TABS Take by mouth.     No current facility-administered medications for this visit.    Review of Systems  Constitutional:  Negative for appetite change, chills, fatigue and fever.  HENT:   Negative for hearing loss and voice change.   Eyes:  Negative for eye problems.  Respiratory:  Negative for chest tightness and cough.   Cardiovascular:  Negative for chest pain.  Gastrointestinal:  Negative for abdominal distention, abdominal pain and blood in stool.  Endocrine: Negative for hot flashes.  Genitourinary:  Negative for difficulty urinating and frequency.   Musculoskeletal:  Positive for arthralgias.  Skin:  Negative for itching and rash.  Neurological:  Negative for extremity weakness.  Hematological:  Negative for adenopathy.  Psychiatric/Behavioral:  Negative for confusion.      PHYSICAL EXAMINATION:  Vitals:   09/14/23 1050 09/14/23 1104  BP: (!) 155/104 (!) 165/111  Pulse: 94   Resp: 18   Temp: (!) 97.4 F (36.3 C)   SpO2: 100%    Filed Weights   09/14/23 1050  Weight: 183 lb (83 kg)    Physical Exam Constitutional:      General: She is not in acute distress.    Appearance: She is not diaphoretic.  HENT:     Head: Normocephalic and atraumatic.     Mouth/Throat:     Pharynx: No oropharyngeal exudate.  Eyes:     General: No scleral icterus. Cardiovascular:     Rate and Rhythm: Normal rate.  Pulmonary:     Effort: Pulmonary effort is normal. No respiratory distress.     Breath sounds: Wheezing present. No rales.  Abdominal:     General: There is no distension.     Palpations: Abdomen is soft.  Tenderness: There is no abdominal  tenderness.  Musculoskeletal:        General: Normal range of motion.     Cervical back: Normal range of motion and neck supple.  Skin:    Findings: No erythema.  Neurological:     Mental Status: [name] and oriented to person, place, and time. Mental status is at baseline.     Cranial Nerves: No cranial nerve deficit.     Motor: No abnormal muscle tone.  Psychiatric:        Mood and Affect: Affect normal.     LABORATORY DATA:  I have reviewed the data as listed    Latest Ref Rng & Units 08/31/2023   10:04 AM 02/08/2023   10:58 AM 08/04/2022   11:45 AM  CBC  WBC 4.0 - 10.5 K/uL 13.6  15.8  15.0   Hemoglobin 12.0 - 15.0 g/dL 16.1  09.6  04.5   Hematocrit 36.0 - 46.0 % 42.8  40.1  40.4   Platelets 150 - 400 K/uL 327  353  301       Latest Ref Rng & Units 09/14/2023   10:33 AM 08/31/2023   10:03 AM 02/08/2023   10:58 AM  CMP  Glucose 70 - 99 mg/dL 409  811  96   BUN 6 - 20 mg/dL 11  5  7    Creatinine 0.44 - 1.00 mg/dL 9.14  7.82  9.56   Sodium 135 - 145 mmol/L 138  140  138   Potassium 3.5 - 5.1 mmol/L 3.7  2.9  3.8   Chloride 98 - 111 mmol/L 103  104  104   CO2 22 - 32 mmol/L 24  24  24    Calcium 8.9 - 10.3 mg/dL 9.4  9.2  9.0   Total Protein 6.5 - 8.1 g/dL  6.9  6.6   Total Bilirubin <1.2 mg/dL  0.5  0.3   Alkaline Phos 38 - 126 U/L  135  110   AST 15 - 41 U/L  19  16   ALT 0 - 44 U/L  14  16    Lab Results  Component Value Date   LDH 127 08/04/2022    RADIOGRAPHIC STUDIES: I have personally reviewed the radiological images as listed and agreed with the findings in the report. No results found.

## 2023-09-14 NOTE — Assessment & Plan Note (Addendum)
Chronic,likely reactive due to chronic inflammation, and long term steroid inhaler usage Peripheral flow cytometry showed that the lymphocytosis is due to an increase in CD4+ T cells, likely a reactive process  Observation.

## 2023-09-14 NOTE — Progress Notes (Signed)
Synopsis: Referred in by Dale [place], MD   Subjective:   PATIENT ID: Heather Heather [name] DOB: [birth year], MRN: [id number]  Chief Complaint  Patient presents with   Follow-up    Chest congestion, wheezing, SOB with exertion and talking and prod cough with thick clear sputum.     HPI Expand All Collapse All    @Patient  ID: Heather Heather [name], Heather [name]    DOB: [birth year], 47 y.o.   MRN: [id number]      Chief Complaint  Patient presents with   Follow-up      Referring provider: Dale Drexel, MD   HPI: 47 year old Heather [name] former smoker followed for moderate persistent asthma Medical history significant for long-haul COVID 2021 Medical history significant for seizure disorder, MGUS, IBS, anxiety, ADHD GERD   TEST/EVENTS :  December 2021 IgE 55, allergy panel positive for dust, dog and cat dander, grass and trees, absolute eosinophil count 100    PFT 04/2022 Pending unable to perform them given wheezing and chest tightness.    Chest x-ray January 2024 clear lungs Chest ray May 19, 2022 clear lungs   07/13/2023 Follow up ; Asthma  Patient presents for 1 year follow-up.  Patient has moderate persistent asthma.  Patient remains on Breztri inhaler twice daily.  She remains on Zyrtec and Singulair daily since last visit patient says her breathing has been doing better with less cough and wheezing . Feels advair worked better than breztri.  Patient was set up for PFTs last visit but unfortunately have not been completed. No increased albuterol /neb use .   Complains of 3-4 weeks of sinus congestion , drainage, scratchy throat, sinus pressure /teeth pain. Has been using OTC sinus meds without much help. Taking Zyrtec , Claritin and Flonase. Has not been taking Singulair .      08/17/2023 Follow up, Asthma, post covid bronchitis.   ROS All systems were reviewed and are negative except for the above.  Objective:   There were no vitals filed for this visit.    on  RA BMI Readings from Last 3 Encounters:  08/30/23 28.14 kg/m  08/17/23 28.46 kg/m  07/30/23 29.41 kg/m   Wt Readings from Last 3 Encounters:  08/30/23 177 lb (80.3 kg)  08/17/23 179 lb (81.2 kg)  07/30/23 185 lb (83.9 kg)    Physical Exam GEN: NAD, coughing consistently with audible wheezing HEENT: Supple Neck, Reactive Pupils, EOMI  CVS: Normal S1, Normal S2, RRR, No murmurs or ES appreciated  Lungs: Clear bilateral air entry.  Abdomen: Soft, non tender, non distended, + BS  Extremities: Warm and well perfused, No edema  Skin: No suspicious lesions appreciated  Psych: Normal Affect  Ancillary Information   CBC    Component Value Date/Time   WBC 13.6 (H) 08/31/2023 1004   WBC 15.8 (H) 02/08/2023 1058   RBC 5.19 (H) 08/31/2023 1004   HGB 14.3 08/31/2023 1004   HGB 13.2 06/19/2014 1437   HCT 42.8 08/31/2023 1004   HCT 40.7 06/19/2014 1437   PLT 327 08/31/2023 1004   PLT 185 06/19/2014 1437   MCV 82.5 08/31/2023 1004   MCV 87 06/19/2014 1437   MCH 27.6 08/31/2023 1004   MCHC 33.4 08/31/2023 1004   RDW 14.4 08/31/2023 1004   RDW 14.2 06/19/2014 1437   LYMPHSABS 2.2 08/31/2023 1004   MONOABS 0.7 08/31/2023 1004   EOSABS 0.1 08/31/2023 1004   BASOSABS 0.1 08/31/2023 1004        No data to display  Assessment & Plan:   Moderate Persistent Asthma  Post COVID bronchitis. Acute sinusitis  Relayed that this is acute bronchitis and will take time to improve around 3 to 4 weeks. Plan as below.    Plan  Patient Instructions   Seitch Breztri to advair diskus 500 1 puff bid. Can use as needed during the day as well during this flare. Prescribed airsupra as needed.  Prednisone 40 mg PO daily for 5 days.  Continue on Zyrtec.  C/w Singulair 10mg  At bedtime  Saline nasal rinses  Twice daily    Flonase nasal 2 puffs BID and Ipratropium Nasal spray 1 spray each nostril bid. Albuterol inhaler or Duoneb every 6hr as needed   PFTs once this episode  improves.     RTC 3 months   I spent 40 minutes caring for this patient today, including preparing to see the patient, obtaining a medical history , reviewing a separately obtained history, performing a medically appropriate examination and/or evaluation, counseling and educating the patient/family/caregiver, ordering medications, tests, or procedures, documenting clinical information in the electronic health record, and independently interpreting results (not separately reported/billed) and communicating results to the patient/family/caregiver  Janann Colonel, MD [place] [facility] 09/14/2023 9:08 AM

## 2023-09-21 NOTE — Telephone Encounter (Signed)
Called patient to check in with her. She is still having some coughing spells and having some issues with her breathing but has started to improve since pulmonary switched inhalers. Still having a lot of coughing. She has a preliminary return date 10/01/23. She is following closely with pulmonary and long haul covid clinic. This was just an update from patient.

## 2023-09-27 ENCOUNTER — Telehealth: Payer: Self-pay | Admitting: Internal Medicine

## 2023-09-27 NOTE — Telephone Encounter (Signed)
 Patient dropped off short term disability forms. Forms are up front in Dr Roby Lofts color folder.

## 2023-09-28 NOTE — Telephone Encounter (Signed)
 Paperwork placed out for review to complete .

## 2023-09-28 NOTE — Telephone Encounter (Signed)
 Are you ok with her remaining out of work until she sees covid clinic on 1/23? Estimated return date of 1/24? She does not need letter or paperwork but just needs to send an estimated return to work date to [employer].

## 2023-09-28 NOTE — Telephone Encounter (Signed)
 Yes, if she is still having increased problems with her breathing, talking, etc.

## 2023-10-04 ENCOUNTER — Other Ambulatory Visit: Payer: Self-pay | Admitting: Internal Medicine

## 2023-10-05 NOTE — Telephone Encounter (Signed)
 Rx sent in for zofran. The directions are 1 tablet bid prn.

## 2023-10-08 DIAGNOSIS — Z0279 Encounter for issue of other medical certificate: Secondary | ICD-10-CM

## 2023-10-08 NOTE — Telephone Encounter (Signed)
 Holding msg until form is completed.

## 2023-10-12 NOTE — Telephone Encounter (Signed)
Faxed and copy placed up front for pick up

## 2023-10-15 ENCOUNTER — Ambulatory Visit (INDEPENDENT_AMBULATORY_CARE_PROVIDER_SITE_OTHER): Payer: BC Managed Care – PPO

## 2023-10-15 DIAGNOSIS — E538 Deficiency of other specified B group vitamins: Secondary | ICD-10-CM

## 2023-10-15 MED ORDER — CYANOCOBALAMIN 1000 MCG/ML IJ SOLN
1000.0000 ug | Freq: Once | INTRAMUSCULAR | Status: AC
  Administered 2023-10-15: 1000 ug via INTRAMUSCULAR

## 2023-10-15 NOTE — Progress Notes (Signed)
Pt presented for their vitamin B12 injection. Pt was identified through two identifiers. Pt tolerated shot well in their right deltoid.  

## 2023-10-23 ENCOUNTER — Ambulatory Visit: Payer: BC Managed Care – PPO | Admitting: Pulmonary Disease

## 2023-11-01 ENCOUNTER — Ambulatory Visit (INDEPENDENT_AMBULATORY_CARE_PROVIDER_SITE_OTHER): Payer: BC Managed Care – PPO | Admitting: Internal Medicine

## 2023-11-01 VITALS — BP 128/74 | HR 101 | Temp 98.0°F | Resp 16 | Ht 66.5 in | Wt 181.0 lb

## 2023-11-01 DIAGNOSIS — D72829 Elevated white blood cell count, unspecified: Secondary | ICD-10-CM

## 2023-11-01 DIAGNOSIS — K589 Irritable bowel syndrome without diarrhea: Secondary | ICD-10-CM

## 2023-11-01 DIAGNOSIS — R5383 Other fatigue: Secondary | ICD-10-CM

## 2023-11-01 DIAGNOSIS — R059 Cough, unspecified: Secondary | ICD-10-CM

## 2023-11-01 DIAGNOSIS — K5900 Constipation, unspecified: Secondary | ICD-10-CM

## 2023-11-01 DIAGNOSIS — E7800 Pure hypercholesterolemia, unspecified: Secondary | ICD-10-CM | POA: Diagnosis not present

## 2023-11-01 DIAGNOSIS — G40909 Epilepsy, unspecified, not intractable, without status epilepticus: Secondary | ICD-10-CM | POA: Diagnosis not present

## 2023-11-01 DIAGNOSIS — M79604 Pain in right leg: Secondary | ICD-10-CM

## 2023-11-01 DIAGNOSIS — D472 Monoclonal gammopathy: Secondary | ICD-10-CM

## 2023-11-01 DIAGNOSIS — Z8616 Personal history of COVID-19: Secondary | ICD-10-CM

## 2023-11-01 DIAGNOSIS — F419 Anxiety disorder, unspecified: Secondary | ICD-10-CM

## 2023-11-01 DIAGNOSIS — E876 Hypokalemia: Secondary | ICD-10-CM

## 2023-11-01 DIAGNOSIS — G43809 Other migraine, not intractable, without status migrainosus: Secondary | ICD-10-CM | POA: Diagnosis not present

## 2023-11-01 DIAGNOSIS — M79605 Pain in left leg: Secondary | ICD-10-CM

## 2023-11-01 DIAGNOSIS — Z0289 Encounter for other administrative examinations: Secondary | ICD-10-CM

## 2023-11-01 NOTE — Progress Notes (Signed)
 Subjective:    Patient ID: Heather [name], female    DOB: 04-10-1976, 48 y.o.   MRN: [id number]  Patient here for  Chief Complaint  Patient presents with   Medical Management of Chronic Issues    HPI Here for a scheduled follow up - regarding recent covid infection and residual problems, hypercholesterolemia and increased stress.  She is being followed - covid clinic. Just saw OT 10/30/23 - treatment plan reviewed. Currently planning for f/u 1x every other week for 6 visits. She reports leg weakness - started after her infection and describes muscle pain - feels like it extends to the bone. Per their report, this can be common in post covid patients and can be due to small fiber neuropathy. Has seen neurology previously at [facility]. Also is taking Linzess  - to help with constipation. Also drinking fruit juices to keep bowels moving. Persistent issues with migraines. Is followed by [facility]. Had f/u with PT 10/23/23 - worsening fatigue after covid. Was started on low dose naltrexone - given chronic cough. She is also using advair - scheduled 2x/day and using neb/rescue inhaler prn. Also taking singulair . Is followed by pulmonary as well. Per review, they recommended continuing leave of absence from work with return for reevaluation in 2 months (12/21/23). Also saw Dr Babara f/u 12/20 - f/u MGUS. Recommended observation. She reports persistent fatigue.  Cough is better, but still with increased cough. Eating.    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 hauler    Depression    bipolar   Dysrhythmia    Fainting spell    H/O   H/O cardiac arrhythmia    Migraines    Seizures (HCC)    Past Surgical History:  Procedure Laterality Date   COLONOSCOPY WITH PROPOFOL  N/A 06/03/2021   Procedure: COLONOSCOPY WITH PROPOFOL ;  Surgeon: Janalyn Keene NOVAK, MD;  Location: [facility] ENDOSCOPY;  Service: Endoscopy;  Laterality: N/A;   ESOPHAGOGASTRODUODENOSCOPY N/A 06/03/2021    Procedure: ESOPHAGOGASTRODUODENOSCOPY (EGD);  Surgeon: Janalyn Keene NOVAK, MD;  Location: [facility] ENDOSCOPY;  Service: Endoscopy;  Laterality: N/A;   TUBAL LIGATION Bilateral 09/25/2002   tubal reversal     Family History  Problem Relation Age of Onset   Hypertension Mother    Diabetes Mother    Arthritis Mother    Heart disease Mother    Breast cancer Neg Hx    Social History   Socioeconomic History   Marital status: Married    Spouse name: Not on file   Number of children: Not on file   Years of education: Not on file   Highest education [name]: Not on file  Occupational History   Not on file  Tobacco Use   Smoking status: Former    Current packs/day: 0.00    Average packs/day: 0.5 packs/day for 5.0 years (2.5 ttl pk-yrs)    Types: Cigarettes    Start date: 2008    Quit date: 2013    Years since quitting: 12.1   Smokeless tobacco: Never   Tobacco comments:    Quit mid October 2014  Vaping Use   Vaping status: Never Used  Substance and Sexual Activity   Alcohol use: No    Alcohol/week: 0.0 standard drinks of alcohol   Drug use: No   Sexual activity: Yes    Birth control/protection: None  Other Topics Concern   Not on file  Social History Narrative   Not on file  Social Drivers of Health   Financial Resource Strain: Medium Risk (10/30/2023)   Overall Financial Resource Strain (CARDIA)    Difficulty of Paying Living Expenses: Somewhat hard  Food Insecurity: Food Insecurity Present (10/30/2023)   Hunger Vital Sign    Worried About Running Out of Food in the Last Year: Sometimes true    Ran Out of Food in the Last Year: Sometimes true  Transportation Needs: Unknown (10/30/2023)   PRAPARE - Transportation    Lack of Transportation (Medical): No    Lack of Transportation (Non-Medical): Patient declined  Physical Activity: Unknown (10/30/2023)   Exercise Vital Sign    Days of Exercise per Week: Patient declined    Minutes of Exercise per Session: Not on file  Stress:  Stress Concern Present (10/30/2023)   [employer] of Occupational Health - Occupational Stress Questionnaire    Feeling of Stress : To some extent  Social Connections: Unknown (10/30/2023)   Social Connection and Isolation Panel [NHANES]    Frequency of Communication with Friends and Family: Patient declined    Frequency of Social Gatherings with Friends and Family: Never    Attends Religious Services: Never    Database Administrator or Organizations: No    Attends Engineer, Structural: Not on file    Marital Status: Patient declined     Review of Systems  Constitutional:  Positive for fatigue. Negative for unexpected weight change.  Respiratory:  Positive for cough. Negative for chest tightness.        Breathing is overall better.   Cardiovascular:  Negative for chest pain and palpitations.  Gastrointestinal:  Negative for abdominal pain and vomiting.  Genitourinary:  Negative for difficulty urinating and dysuria.  Musculoskeletal:  Negative for joint swelling.       Upper leg aching as outlined.   Skin:  Negative for color change and rash.  Neurological:  Negative for dizziness and headaches.  Psychiatric/Behavioral:  Negative for agitation.        Increased stress with her medical issues.  Also increased stress with family issues. Discussed.  Seeing psychiatry. No SI. Requested information regarding therapist.        Objective:     BP 128/74   Pulse (!) 101   Temp 98 F (36.7 C)   Resp 16   Ht 5' 6.5 (1.689 m)   Wt 181 lb (82.1 kg)   SpO2 99%   BMI 28.78 kg/m  Wt Readings from Last 3 Encounters:  11/01/23 181 lb (82.1 kg)  09/14/23 183 lb (83 kg)  09/14/23 184 lb 9.6 oz (83.7 kg)    Physical Exam Vitals reviewed.  Constitutional:      General: She is not in acute distress.    Appearance: Normal appearance.  HENT:     Head: Normocephalic and atraumatic.     Right Ear: External ear normal.     Left Ear: External ear normal.     Mouth/Throat:      Pharynx: No oropharyngeal exudate or posterior oropharyngeal erythema.  Eyes:     General: No scleral icterus.       Right eye: No discharge.        Left eye: No discharge.     Conjunctiva/sclera: Conjunctivae normal.  Neck:     Thyroid : No thyromegaly.  Cardiovascular:     Rate and Rhythm: Normal rate and regular rhythm.  Pulmonary:     Effort: No respiratory distress.     Breath sounds: Normal breath sounds. No  wheezing.  Abdominal:     General: Bowel sounds are normal.     Palpations: Abdomen is soft.     Tenderness: There is no abdominal tenderness.  Musculoskeletal:        General: No swelling or tenderness.     Cervical back: Neck supple. No tenderness.  Lymphadenopathy:     Cervical: No cervical adenopathy.  Skin:    Findings: No erythema or rash.  Neurological:     Mental Status: [name].  Psychiatric:        Mood and Affect: Mood normal.        Behavior: Behavior normal.         Outpatient Encounter Medications as of 11/01/2023  Medication Sig   naltrexone (DEPADE) 50 MG tablet Dissolve 1 tablet in 50 mL distilled water.  Store in refrigerator for up to 2 weeks.  Directions: 1.5 mL by mouth nightly x 14 days, then increase to 3.0 mL x 14 days, then increase to 4.5 mL.   Acetylcysteine 600 MG TBCR Take by mouth.   AJOVY 225 MG/1.5ML SOAJ Inject into the skin.   albuterol  (VENTOLIN  HFA) 108 (90 Base) MCG/ACT inhaler INHALE 1 TO 2 PUFFS BY MOUTH EVERY 4 HOURS AS NEEDED FOR WHEEZING FOR SHORTNESS OF BREATH   Albuterol -Budesonide  (AIRSUPRA ) 90-80 MCG/ACT AERO Inhale 2 puffs into the lungs every 6 (six) hours.   amphetamine -dextroamphetamine  (ADDERALL XR) 20 MG 24 hr capsule Take 20 mg by mouth every morning.   amphetamine -dextroamphetamine  (ADDERALL) 20 MG tablet Take 20 mg by mouth daily.   ascorbic acid (VITAMIN C) 1000 MG tablet Take by mouth.   BREZTRI  AEROSPHERE 160-9-4.8 MCG/ACT AERO INHALE 2 PUFFS IN THE MORNING AND AT BEDTIME   cetirizine  (ZYRTEC ) 10 MG  tablet Take 1 tablet by mouth once daily   cyanocobalamin  (VITAMIN B12) 1000 MCG/ML injection Inject into the muscle.   diazepam  (VALIUM ) 10 MG tablet Take 1 tablet by mouth 4 (four) times daily as needed.   divalproex  (DEPAKOTE ) 500 MG DR tablet Take by mouth.   fluticasone  (FLONASE ) 50 MCG/ACT nasal spray Place 1 spray into both nostrils daily.   fluticasone -salmeterol (ADVAIR) 500-50 MCG/ACT AEPB Inhale 1 puff into the lungs in the morning and at bedtime.   Galcanezumab-gnlm 120 MG/ML SOAJ Inject into the skin.   guaiFENesin  (MUCINEX ) 600 MG 12 hr tablet Take 2 tablets (1,200 mg total) by mouth 2 (two) times daily as needed.   ipratropium (ATROVENT ) 0.03 % nasal spray Place 2 sprays into both nostrils every 12 (twelve) hours.   ipratropium-albuterol  (DUONEB) 0.5-2.5 (3) MG/3ML SOLN Inhale 3 mLs into the lungs every 4 (four) hours as needed.   loratadine  (CLARITIN ) 10 MG tablet Take 10 mg by mouth daily.   magnesium  oxide (MAG-OX) 400 MG tablet Take 1 tablet (400 mg total) by mouth daily.   montelukast  (SINGULAIR ) 10 MG tablet Take 1 tablet (10 mg total) by mouth at bedtime.   omeprazole  (PRILOSEC) 40 MG capsule Take 1 capsule (40 mg total) by mouth daily before breakfast.   ondansetron  (ZOFRAN -ODT) 4 MG disintegrating tablet DISSOLVE 1 TABLET IN MOUTH TWICE DAILY AS NEEDED FOR NAUSEA FOR VOMITING   potassium chloride  SA (KLOR-CON  M) 20 MEQ tablet Take 1 tablet (20 mEq total) by mouth 2 (two) times daily.   Rimegepant Sulfate  (NURTEC) 75 MG TBDP Take by mouth.   UBRELVY 100 MG TABS Take by mouth.   No facility-administered encounter medications on file as of 11/01/2023.     Lab  Results  Component Value Date   WBC 13.6 (H) 08/31/2023   HGB 14.3 08/31/2023   HCT 42.8 08/31/2023   PLT 327 08/31/2023   GLUCOSE 102 (H) 09/14/2023   CHOL 207 (H) 07/10/2022   TRIG 350.0 (H) 07/10/2022   HDL 36.60 (L) 07/10/2022   LDLDIRECT 136.0 07/10/2022   LDLCALC 134 (H) 08/29/2021   ALT 14 08/31/2023    AST 19 08/31/2023   NA 138 09/14/2023   K 3.7 09/14/2023   CL 103 09/14/2023   CREATININE 0.74 09/14/2023   BUN 11 09/14/2023   CO2 24 09/14/2023   TSH 1.47 08/29/2021    DG Chest 2 View Result Date: 07/30/2023 CLINICAL DATA:  Shortness of breath, COVID positive EXAM: CHEST - 2 VIEW COMPARISON:  10/13/2022 FINDINGS: Cardiac and mediastinal contours are within normal limits. No focal pulmonary opacity. No pleural effusion or pneumothorax. No acute osseous abnormality. IMPRESSION: No acute cardiopulmonary process. Electronically Signed   By: Donald Campion M.D.   On: 07/30/2023 14:29       Assessment & Plan:  Hypercholesterolemia Assessment & Plan: Low cholesterol diet and exercise.  Follow lipid panel.   Orders: -     CBC with Differential/Platelet; Future -     Basic metabolic panel; Future -     Hepatic function panel; Future -     Lipid panel; Future -     TSH; Future  Seizure disorder [facility]) Assessment & Plan: Being followed by neurology.     Other migraine without status migrainosus, not intractable Assessment & Plan: Seeing neurology.  Overall appears to be stable.    MGUS (monoclonal gammopathy of unknown significance) Assessment & Plan: Seeing Dr Babara for f/u MGUS. Evaluated 09/14/23.  Stable.  Recommended check SPEP and light chain ratio - 6 months - observation.    Leukocytosis, unspecified type Assessment & Plan: Work up by hematology. Felt to be reactive. Follow [facility].    Pain in both lower extremities Assessment & Plan:  She reports leg weakness - started after her infection and describes muscle pain - feels like it extends to the bone. Per their report, this can be common in post covid patients and can be due to small fiber neuropathy. Has seen neurology previously at [facility].    Irritable bowel syndrome, unspecified type Assessment & Plan: Constipation - taking linzess .  Drinking juices. Bowels move with linzess .    Hypokalemia Assessment &  Plan: Recheck electrolytes.    History of COVID-19 Assessment & Plan: She is being followed - covid clinic. Just saw OT 10/30/23 - treatment plan reviewed. Currently planning for f/u 1x every other week for 6 visits. Had f/u with PT 10/23/23 - worsening fatigue after covid. Was started on low dose naltrexone - given chronic cough. She is also using advair - scheduled 2x/day and using neb/rescue inhaler prn. Also taking singulair . Is followed by pulmonary as well. Per review, they recommended continuing leave of absence from work with return for reevaluation in 2 months    Other fatigue Assessment & Plan: Persistent issues since covid.  Evaluated at covid clinic.  Evaluated by PT, OT as outlined. Discussed exercise.     Encounter for completion of form with patient Assessment & Plan: Persistent symptoms and limitations as outlined. Still with cough. Is better, but still limitations with talking and activity as outlined.  Being followed - long haul covid clinic. Recommendations as outlined. Called covid clinic to clarify treatment plan.    Cough, unspecified type Assessment &  Plan: Seeing pulmonary. Cough is better. Continues adviar, rescue inhaler and nebs. Continues singulair . Continue f/u with pulmonary.    Constipation, unspecified constipation type Assessment & Plan: Continue linzess . Followed by Dr Unk.    Anxiety Assessment & Plan: Followed by psychiatry.       Allena Hamilton, MD

## 2023-11-02 ENCOUNTER — Telehealth: Payer: Self-pay | Admitting: Internal Medicine

## 2023-11-02 NOTE — Telephone Encounter (Signed)
 Patient need lab/x-ray orders

## 2023-11-03 NOTE — Telephone Encounter (Signed)
 I have placed the order for labs. Please leg Sana know that I spoke to lab and we are not able to do the allergen panel through our lab.  Please let her know prior to her lab appt.

## 2023-11-04 ENCOUNTER — Encounter: Payer: Self-pay | Admitting: Internal Medicine

## 2023-11-04 NOTE — Assessment & Plan Note (Signed)
 Seeing neurology.  Overall appears to be stable.

## 2023-11-04 NOTE — Assessment & Plan Note (Signed)
Recheck electrolytes 

## 2023-11-04 NOTE — Assessment & Plan Note (Signed)
 Seeing Dr Wilhelmenia Harada for f/u MGUS. Evaluated 09/14/23.  Stable.  Recommended check SPEP and light chain ratio - 6 months - observation.

## 2023-11-04 NOTE — Assessment & Plan Note (Signed)
 She is being followed - covid clinic. Just saw OT 10/30/23 - treatment plan reviewed. Currently planning for f/u 1x every other week for 6 visits. Had f/u with PT 10/23/23 - worsening fatigue after covid. Was started on low dose naltrexone - given chronic cough. She is also using advair - scheduled 2x/day and using neb/rescue inhaler prn. Also taking singulair . Is followed by pulmonary as well. Per review, they recommended continuing leave of absence from work with return for reevaluation in 2 months

## 2023-11-04 NOTE — Assessment & Plan Note (Signed)
Being followed by neurology.   

## 2023-11-04 NOTE — Assessment & Plan Note (Signed)
 Low cholesterol diet and exercise.  Follow lipid panel.

## 2023-11-04 NOTE — Assessment & Plan Note (Signed)
 Followed by psychiatry

## 2023-11-04 NOTE — Assessment & Plan Note (Signed)
 Persistent symptoms and limitations as outlined. Still with cough. Is better, but still limitations with talking and activity as outlined.  Being followed - long haul covid clinic. Recommendations as outlined. Called covid clinic to clarify treatment plan.

## 2023-11-04 NOTE — Assessment & Plan Note (Signed)
 Persistent issues since covid.  Evaluated at covid clinic.  Evaluated by PT, OT as outlined. Discussed exercise.

## 2023-11-04 NOTE — Assessment & Plan Note (Signed)
 Work up by hematology. Felt to be reactive. Follow [facility].

## 2023-11-04 NOTE — Assessment & Plan Note (Signed)
 Seeing pulmonary. Cough is better. Continues adviar, rescue inhaler and nebs. Continues singulair . Continue f/u with pulmonary.

## 2023-11-04 NOTE — Assessment & Plan Note (Signed)
 Continue linzess . Followed by Dr Baldomero Bone.

## 2023-11-04 NOTE — Assessment & Plan Note (Signed)
 Constipation - taking linzess .  Drinking juices. Bowels move with linzess .

## 2023-11-04 NOTE — Assessment & Plan Note (Signed)
 She reports leg weakness - started after her infection and describes muscle pain - feels like it extends to the bone. Per their report, this can be common in post covid patients and can be due to small fiber neuropathy. Has seen neurology previously at [facility].

## 2023-11-05 NOTE — Telephone Encounter (Signed)
 LM for patient

## 2023-11-06 NOTE — Telephone Encounter (Signed)
Pt is aware.

## 2023-11-07 ENCOUNTER — Ambulatory Visit (INDEPENDENT_AMBULATORY_CARE_PROVIDER_SITE_OTHER): Payer: BC Managed Care – PPO

## 2023-11-07 ENCOUNTER — Other Ambulatory Visit (INDEPENDENT_AMBULATORY_CARE_PROVIDER_SITE_OTHER): Payer: BC Managed Care – PPO

## 2023-11-07 ENCOUNTER — Other Ambulatory Visit: Payer: Self-pay | Admitting: Internal Medicine

## 2023-11-07 DIAGNOSIS — E538 Deficiency of other specified B group vitamins: Secondary | ICD-10-CM | POA: Diagnosis not present

## 2023-11-07 DIAGNOSIS — E7800 Pure hypercholesterolemia, unspecified: Secondary | ICD-10-CM

## 2023-11-07 DIAGNOSIS — R1031 Right lower quadrant pain: Secondary | ICD-10-CM

## 2023-11-07 LAB — BASIC METABOLIC PANEL
BUN: 8 mg/dL (ref 6–23)
CO2: 28 meq/L (ref 19–32)
Calcium: 8.9 mg/dL (ref 8.4–10.5)
Chloride: 102 meq/L (ref 96–112)
Creatinine, Ser: 0.69 mg/dL (ref 0.40–1.20)
GFR: 102.58 mL/min (ref >60.00)
Glucose, Bld: 98 mg/dL (ref 70–99)
Potassium: 3.7 meq/L (ref 3.5–5.1)
Sodium: 139 meq/L (ref 135–145)

## 2023-11-07 LAB — CBC WITH DIFFERENTIAL/PLATELET
Basophils Absolute: 0.1 10*3/uL (ref 0.0–0.1)
Basophils Relative: 0.7 % (ref 0.0–3.0)
Eosinophils Absolute: 0.2 10*3/uL (ref 0.0–0.7)
Eosinophils Relative: 1.8 % (ref 0.0–5.0)
HCT: 43.4 % (ref 36.0–46.0)
Hemoglobin: 14.1 g/dL (ref 12.0–15.0)
Lymphocytes Relative: 18.5 % (ref 12.0–46.0)
Lymphs Abs: 2.5 10*3/uL (ref 0.7–4.0)
MCHC: 32.4 g/dL (ref 30.0–36.0)
MCV: 84.5 fL (ref 78.0–100.0)
Monocytes Absolute: 0.8 10*3/uL (ref 0.1–1.0)
Monocytes Relative: 5.9 % (ref 3.0–12.0)
Neutro Abs: 9.7 10*3/uL — ABNORMAL HIGH (ref 1.4–7.7)
Neutrophils Relative %: 73.1 % (ref 43.0–77.0)
Platelets: 280 10*3/uL (ref 150.0–400.0)
RBC: 5.13 Mil/uL — ABNORMAL HIGH (ref 3.87–5.11)
RDW: 15 % (ref 11.5–15.5)
WBC: 13.3 10*3/uL — ABNORMAL HIGH (ref 4.0–10.5)

## 2023-11-07 LAB — HEPATIC FUNCTION PANEL
ALT: 11 U/L (ref 0–35)
AST: 13 U/L (ref 0–37)
Albumin: 4.3 g/dL (ref 3.5–5.2)
Alkaline Phosphatase: 137 U/L — ABNORMAL HIGH (ref 39–117)
Bilirubin, Direct: 0.1 mg/dL (ref 0.0–0.3)
Total Bilirubin: 0.4 mg/dL (ref 0.2–1.2)
Total Protein: 6.4 g/dL (ref 6.0–8.3)

## 2023-11-07 LAB — LIPID PANEL
Cholesterol: 199 mg/dL (ref 0–200)
HDL: 38.6 mg/dL — ABNORMAL LOW (ref >39.00)
LDL Cholesterol: 101 mg/dL — ABNORMAL HIGH (ref 0–99)
NonHDL: 160.03
Total CHOL/HDL Ratio: 5
Triglycerides: 294 mg/dL — ABNORMAL HIGH (ref 0.0–149.0)
VLDL: 58.8 mg/dL — ABNORMAL HIGH (ref 0.0–40.0)

## 2023-11-07 LAB — TSH: TSH: 2.26 u[IU]/mL (ref 0.35–5.50)

## 2023-11-07 MED ORDER — CYANOCOBALAMIN 1000 MCG/ML IJ SOLN
1000.0000 ug | Freq: Once | INTRAMUSCULAR | Status: AC
  Administered 2023-11-07: 1000 ug via INTRAMUSCULAR

## 2023-11-07 NOTE — Progress Notes (Signed)
Order placed for right hip xray.

## 2023-11-07 NOTE — Progress Notes (Signed)
Patient is in office today for a nurse visit for B12 Injection. Patient Injection was given in the  Left deltoid. Patient tolerated injection well.

## 2023-11-08 ENCOUNTER — Other Ambulatory Visit: Payer: Self-pay

## 2023-11-08 DIAGNOSIS — R945 Abnormal results of liver function studies: Secondary | ICD-10-CM

## 2023-11-13 ENCOUNTER — Encounter: Payer: Self-pay | Admitting: Internal Medicine

## 2023-11-15 ENCOUNTER — Ambulatory Visit: Payer: BC Managed Care – PPO

## 2023-12-05 ENCOUNTER — Ambulatory Visit: Payer: BC Managed Care – PPO

## 2023-12-07 ENCOUNTER — Ambulatory Visit
Admission: RE | Admit: 2023-12-07 | Discharge: 2023-12-07 | Disposition: A | Discharge status: Home / Self Care | Source: Ambulatory Visit | Attending: Emergency Medicine | Admitting: Emergency Medicine

## 2023-12-07 ENCOUNTER — Ambulatory Visit (INDEPENDENT_AMBULATORY_CARE_PROVIDER_SITE_OTHER): Admitting: *Deleted

## 2023-12-07 VITALS — BP 193/113 | HR 113 | Temp 98.8°F | Ht 66.0 in | Wt 180.0 lb

## 2023-12-07 DIAGNOSIS — J4521 Mild intermittent asthma with (acute) exacerbation: Secondary | ICD-10-CM

## 2023-12-07 DIAGNOSIS — R945 Abnormal results of liver function studies: Secondary | ICD-10-CM

## 2023-12-07 DIAGNOSIS — E538 Deficiency of other specified B group vitamins: Secondary | ICD-10-CM | POA: Diagnosis not present

## 2023-12-07 MED ORDER — PREDNISONE 10 MG (21) PO TBPK: ORAL_TABLET | Freq: Every day | ORAL | 0 refills | Status: DC

## 2023-12-07 MED ORDER — CYANOCOBALAMIN 1000 MCG/ML IJ SOLN
1000.0000 ug | Freq: Once | INTRAMUSCULAR | Status: AC
  Administered 2023-12-07: 1000 ug via INTRAMUSCULAR

## 2023-12-07 MED ORDER — GUAIFENESIN-CODEINE 100-10 MG/5ML PO SOLN: 5.0000 mL | Freq: Four times a day (QID) | ORAL | 0 refills | Status: DC | PRN

## 2023-12-07 MED ORDER — BENZONATATE 100 MG PO CAPS: 100.0000 mg | ORAL_CAPSULE | Freq: Three times a day (TID) | ORAL | 0 refills | Status: DC

## 2023-12-07 MED ORDER — METHYLPREDNISOLONE ACETATE 80 MG/ML IJ SUSP
80.0000 mg | Freq: Once | INTRAMUSCULAR | Status: AC
  Administered 2023-12-07: 80 mg via INTRAMUSCULAR

## 2023-12-07 MED ORDER — IPRATROPIUM-ALBUTEROL 0.5-2.5 (3) MG/3ML IN SOLN
3.0000 mL | Freq: Once | RESPIRATORY_TRACT | Status: AC
  Administered 2023-12-07: 3 mL via RESPIRATORY_TRACT

## 2023-12-07 NOTE — Addendum Note (Signed)
 Addended by: Warden Fillers on: 12/07/2023 02:17 PM   Modules accepted: Orders

## 2023-12-07 NOTE — ED Provider Notes (Signed)
 Heather [name]    CSN: [id number] Arrival date & time: 12/07/23  1428      History   Chief Complaint Chief Complaint  Patient presents with   Cough    My chest has been tight for last couple days. Has increasingly gotten worse n last 24 hours coughing a lot and short breath wheezing I do have asthma and allergies and long haul covid. - Entered by patient    HPI Heather [name] is a 48 y.o. female.   Patient presents for evaluation of nasal congestion, a productive cough, shortness of breath at rest and wheezing present for 3 days.  Initially was sneezing therefore thought to be allergies but symptoms progressively worsening over the last 24 hours.  Shortness of breath has become more prominent, difficulty completing sentences.  Has used albuterol inhaler, nebulizer and Mucinex which has been minimally effective.  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 hauler    Depression    bipolar   Dysrhythmia    Fainting spell    H/O   H/O cardiac arrhythmia    Migraines    Seizures (HCC)     Patient Active Problem List   Diagnosis Date Noted   Hypokalemia 09/14/2023   COVID-19 virus infection 07/24/2023   Acute sinusitis 07/13/2023   Menstrual changes 04/27/2023   Leukocytosis 02/23/2023   B12 deficiency 01/28/2023   Leg pain 07/19/2022   ADHD 06/30/2022   Irritable bowel syndrome (IBS) 06/29/2022   Abnormal Pap smear of cervix 03/15/2022   Constipation 01/05/2022   Non-intractable vomiting    Hiatal hernia    Retained food in stomach    Special screening for malignant neoplasms, colon    Rectal polyp    Asthmatic bronchitis with acute exacerbation 05/27/2021   Pre-op evaluation 05/24/2021   Anxiety 02/27/2021   Cough 02/25/2021   Vitamin D deficiency 02/04/2021   Fatigue 09/29/2020   Nausea 09/12/2020   Encounter for completion of form with patient 07/18/2020   History of COVID-19 06/11/2020   Bronchitis 09/22/2019   Sleep  disturbance 03/16/2019   Herpes zoster without complication 01/29/2019   History of seizure 01/22/2019   GERD (gastroesophageal reflux disease) 01/05/2019   Back pain 12/21/2018   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   Abdominal pain 11/19/2018   Conjunctivitis 05/23/2018   Sleeping difficulty 10/31/2017   Rash 06/11/2017   Health care maintenance 10/12/2016   History of miscarriage 10/12/2016   Hypercholesterolemia 07/09/2016   Migraine 07/20/2013   Seizure disorder (HCC) 07/20/2013   Asthma 07/20/2013   Alternating constipation and diarrhea 07/20/2013   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    Past Surgical History:  Procedure Laterality Date   COLONOSCOPY WITH PROPOFOL N/A 06/03/2021   Procedure: COLONOSCOPY WITH PROPOFOL;  Surgeon: Pasty Spillers, MD;  Location: [facility] ENDOSCOPY;  Service: Endoscopy;  Laterality: N/A;   ESOPHAGOGASTRODUODENOSCOPY N/A 06/03/2021   Procedure: ESOPHAGOGASTRODUODENOSCOPY (EGD);  Surgeon: Pasty Spillers, MD;  Location: [facility] ENDOSCOPY;  Service: Endoscopy;  Laterality: N/A;   TUBAL LIGATION Bilateral 09/25/2002   tubal reversal      OB History     Gravida  4   Para  4   Term  4   Preterm      AB      Living  4      SAB      IAB      Ectopic  Multiple      Live Births  4            Home Medications    Prior to Admission medications   Medication Sig Start Date End Date Taking? Authorizing Provider  Acetylcysteine 600 MG TBCR Take by mouth. 12/05/21  Yes [provider]  AJOVY 225 MG/1.5ML SOAJ Inject into the skin. 07/17/23  Yes [provider]  albuterol (VENTOLIN HFA) 108 (90 Base) MCG/ACT inhaler INHALE 1 TO 2 PUFFS BY MOUTH EVERY 4 HOURS AS NEEDED FOR WHEEZING FOR SHORTNESS OF BREATH 08/03/23  Yes Dale Dorrington, MD  Albuterol-Budesonide (AIRSUPRA) 90-80 MCG/ACT AERO Inhale 2 puffs into the lungs every 6 (six) hours. 09/14/23  Yes Assaker, [place],  MD  amphetamine-dextroamphetamine (ADDERALL XR) 20 MG 24 hr capsule Take 20 mg by mouth every morning. 07/28/22  Yes [provider]  amphetamine-dextroamphetamine (ADDERALL) 20 MG tablet Take 20 mg by mouth daily. 08/15/23  Yes [provider]  ascorbic acid (VITAMIN C) 1000 MG tablet Take by mouth.   Yes [provider]  benzonatate (TESSALON) 100 MG capsule Take 1 capsule (100 mg total) by mouth every 8 (eight) hours. 12/07/23  Yes Krishna Heuer R, NP  BREZTRI AEROSPHERE 160-9-4.8 MCG/ACT AERO INHALE 2 PUFFS IN THE MORNING AND AT BEDTIME 08/10/23  Yes Dale Force, MD  cetirizine (ZYRTEC) 10 MG tablet Take 1 tablet by mouth once daily 10/04/22  Yes Dale Cowley, MD  cyanocobalamin (VITAMIN B12) 1000 MCG/ML injection Inject into the muscle. 04/19/23  Yes [provider]  diazepam (VALIUM) 10 MG tablet Take 1 tablet by mouth 4 (four) times daily as needed. 11/23/21  Yes [provider]  divalproex (DEPAKOTE) 500 MG DR tablet Take by mouth.   Yes [provider]  fluticasone (FLONASE) 50 MCG/ACT nasal spray Place 1 spray into both nostrils daily. 06/29/23  Yes Dale [place], MD  fluticasone-salmeterol (ADVAIR) 500-50 MCG/ACT AEPB Inhale 1 puff into the lungs in the morning and at bedtime. 09/14/23  Yes Assaker, [place], MD  Galcanezumab-gnlm 120 MG/ML SOAJ Inject into the skin. 06/13/23  Yes [provider]  guaiFENesin-codeine 100-10 MG/5ML syrup Take 5 mLs by mouth every 6 (six) hours as needed for cough. 12/07/23  Yes Anaika Santillano R, NP  ipratropium (ATROVENT) 0.03 % nasal spray Place 2 sprays into both nostrils every 12 (twelve) hours. 08/21/23  Yes Assaker, [place], MD  ipratropium-albuterol (DUONEB) 0.5-2.5 (3) MG/3ML SOLN Inhale 3 mLs into the lungs every 4 (four) hours as needed. 04/27/23  Yes Dale Dickson, MD  magnesium oxide (MAG-OX) 400 MG tablet Take 1 tablet (400 mg total) by mouth daily. 07/21/22  Yes Dale Goddard, MD  montelukast (SINGULAIR) 10 MG tablet Take 1 tablet (10 mg total) by mouth at bedtime. 07/13/23  Yes Parrett, Tammy S, NP  naltrexone (DEPADE) 50 MG tablet Dissolve 1 tablet in 50 mL distilled water.  Store in refrigerator for up to 2 weeks.  Directions: 1.5 mL by mouth nightly x 14 days, then increase to 3.0 mL x 14 days, then increase to 4.5 mL. 10/23/23  Yes [provider]  ondansetron (ZOFRAN-ODT) 4 MG disintegrating tablet DISSOLVE 1 TABLET IN MOUTH TWICE DAILY AS NEEDED FOR NAUSEA FOR VOMITING 10/05/23  Yes Dale Newport, MD  potassium chloride SA (KLOR-CON M) 20 MEQ tablet Take 1 tablet (20 mEq total) by mouth 2 (two) times daily. 09/04/23  Yes Rickard Patience, MD  predniSONE (STERAPRED UNI-PAK 21 TAB) 10 MG (21) TBPK tablet Take by  mouth daily. Take 6 tabs by mouth daily  for 1 days, then 5 tabs for 1 days, then 4 tabs for 1 days, then 3 tabs for 1 days, 2 tabs for 1 days, then 1 tab by mouth daily for 1 days 12/07/23  Yes Maelin Kurkowski R, NP  Rimegepant Sulfate (NURTEC) 75 MG TBDP Take by mouth. 04/20/23  Yes [provider]  UBRELVY 100 MG TABS Take by mouth.   Yes [provider]  guaiFENesin (MUCINEX) 600 MG 12 hr tablet Take 2 tablets (1,200 mg total) by mouth 2 (two) times daily as needed. 07/23/23   Corwin Levins, MD  loratadine (CLARITIN) 10 MG tablet Take 10 mg by mouth daily.    [provider]  omeprazole (PRILOSEC) 40 MG capsule Take 1 capsule (40 mg total) by mouth daily before breakfast. 04/27/23 09/14/23  Dale Litchfield, MD    Family History Family History  Problem Relation Age of Onset   Hypertension Mother    Diabetes Mother    Arthritis Mother    Heart disease Mother    Breast cancer Neg Hx     Social History Social History   Tobacco Use   Smoking status: Former    Current packs/day: 0.00    Average packs/day: 0.5 packs/day for 5.0 years (2.5 ttl pk-yrs)    Types: Cigarettes    Start date: 2008    Quit date: 2013     Years since quitting: 12.2   Smokeless tobacco: Never   Tobacco comments:    Quit mid October 2014  Vaping Use   Vaping status: Never Used  Substance Use Topics   Alcohol use: No    Alcohol/week: 0.0 standard drinks of alcohol   Drug use: No     Allergies   Levaquin [levofloxacin], Tramadol, Amoxicillin, Augmentin [amoxicillin-pot clavulanate], Erythromycin, and Lithium   Review of Systems Review of Systems   Physical Exam Triage Vital Signs ED Triage Vitals  Encounter Vitals Group     BP 12/07/23 1437 (!) 193/113     Systolic BP Percentile --      Diastolic BP Percentile --      Pulse Rate 12/07/23 1437 (!) 113     Resp --      Temp 12/07/23 1437 98.8 F (37.1 C)     Temp Source 12/07/23 1437 Oral     SpO2 12/07/23 1437 97 %     Weight 12/07/23 1438 180 lb (81.6 kg)     Height 12/07/23 1438 5\' 6"  (1.676 m)     Head Circumference --      Peak Flow --      Pain Score 12/07/23 1438 7     Pain Loc --      Pain Education --      Exclude from Growth Chart --    No data found.  Updated Vital Signs BP (!) 193/113 (BP Location: Left Arm)   Pulse (!) 113   Temp 98.8 F (37.1 C) (Oral)   Ht 5\' 6"  (1.676 m)   Wt 180 lb (81.6 kg)   LMP 11/30/2023   SpO2 97%   BMI 29.05 kg/m   Visual Acuity Right Eye Distance:   Left Eye Distance:   Bilateral Distance:    Right Eye Near:   Left Eye Near:    Bilateral Near:     Physical Exam Constitutional:      Appearance: Normal appearance.  HENT:     Nose: Congestion present.  Eyes:  Extraocular Movements: Extraocular movements intact.  Cardiovascular:     Rate and Rhythm: Normal rate and regular rhythm.     Pulses: Normal pulses.     Heart sounds: Normal heart sounds.  Pulmonary:     Breath sounds: Wheezing present.     Comments: Labored breathing Neurological:     Mental Status: [name] and oriented to person, place, and time. Mental status is at baseline.      UC Treatments / Results  Labs (all  labs ordered are listed, but only abnormal results are displayed) Labs Reviewed - No data to display  EKG   Radiology No results found.  Procedures Procedures (including critical care time)  Medications Ordered in UC Medications  methylPREDNISolone acetate (DEPO-MEDROL) injection 80 mg (80 mg Intramuscular Given 12/07/23 1453)  ipratropium-albuterol (DUONEB) 0.5-2.5 (3) MG/3ML nebulizer solution 3 mL (3 mLs Nebulization Given 12/07/23 1450)    Initial Impression / Assessment and Plan / UC Course  I have reviewed the triage vital signs and the nursing notes.  Pertinent labs & imaging results that were available during my care of the patient were reviewed by me and considered in my medical decision making (see chart for details).  Mild intermittent asthma with acute exacerbation  Blood pressure elevated however patient persistently coughing throughout the entirety of exam, unable to complete sentences without becoming short of breath or coughing, O2 saturation 97%, wheezing throughout the lobes, methylprednisolone IM and DuoNeb given on reevaluation some wheezing to the lower lobes has subsided but upper lobe wheezing persisting, patient still experiencing shortness of breath but endorses it has improved, declined second administration of DuoNeb, prescribed prednisone outpatient and recommended additional supportive measures, endorses availability of all prescribed inhalers, advised follow-up for worsening symptoms Final Clinical Impressions(s) / UC Diagnoses   Final diagnoses:  Mild intermittent asthma with acute exacerbation     Discharge Instructions      Asthma most likely flared  You have been given injection of steroids to open and relax the airway, begin oral steroids starting tomorrow  Tessalon pill has been refilled    You can take Tylenol and/or Ibuprofen as needed for fever reduction and pain relief.   For cough: honey 1/2 to 1 teaspoon (you can dilute the honey in  water or another fluid).  You can also use guaifenesin and dextromethorphan for cough. You can use a humidifier for chest congestion and cough.  If you don't have a humidifier, you can sit in the bathroom with the hot shower running.      For sore throat: try warm salt water gargles, cepacol lozenges, throat spray, warm tea or water with lemon/honey, popsicles or ice, or OTC cold relief medicine for throat discomfort.   For congestion: take a daily anti-histamine like Zyrtec, Claritin, and a oral decongestant, such as pseudoephedrine.  You can also use Flonase 1-2 sprays in each nostril daily.   It is important to stay hydrated: drink plenty of fluids (water, gatorade/powerade/pedialyte, juices, or teas) to keep your throat moisturized and help further relieve irritation/discomfort.    ED Prescriptions     Medication Sig Dispense Auth. Provider   predniSONE (STERAPRED UNI-PAK 21 TAB) 10 MG (21) TBPK tablet Take by mouth daily. Take 6 tabs by mouth daily  for 1 days, then 5 tabs for 1 days, then 4 tabs for 1 days, then 3 tabs for 1 days, 2 tabs for 1 days, then 1 tab by mouth daily for 1 days 21 tablet [place], [place]  R, NP   benzonatate (TESSALON) 100 MG capsule Take 1 capsule (100 mg total) by mouth every 8 (eight) hours. 21 capsule [place] Marchitto R, NP   guaiFENesin-codeine 100-10 MG/5ML syrup Take 5 mLs by mouth every 6 (six) hours as needed for cough. 120 mL Jimmie Dattilio, Elita Boone, NP      I have reviewed the PDMP during this encounter.   Valinda Hoar, NP 12/07/23 1512

## 2023-12-07 NOTE — Discharge Instructions (Addendum)
 Asthma most likely flared  You have been given injection of steroids to open and relax the airway, begin oral steroids starting tomorrow  Tessalon pill has been refilled    You can take Tylenol and/or Ibuprofen as needed for fever reduction and pain relief.   For cough: honey 1/2 to 1 teaspoon (you can dilute the honey in water or another fluid).  You can also use guaifenesin and dextromethorphan for cough. You can use a humidifier for chest congestion and cough.  If you don't have a humidifier, you can sit in the bathroom with the hot shower running.      For sore throat: try warm salt water gargles, cepacol lozenges, throat spray, warm tea or water with lemon/honey, popsicles or ice, or OTC cold relief medicine for throat discomfort.   For congestion: take a daily anti-histamine like Zyrtec, Claritin, and a oral decongestant, such as pseudoephedrine.  You can also use Flonase 1-2 sprays in each nostril daily.   It is important to stay hydrated: drink plenty of fluids (water, gatorade/powerade/pedialyte, juices, or teas) to keep your throat moisturized and help further relieve irritation/discomfort.

## 2023-12-07 NOTE — ED Triage Notes (Signed)
 Pt c/o SOB, cough, wheezing x3days  Pt has a history of Asthma and long covid  Pt states that her pulse ox at home reads between 95-93%.  Pt states that her head, chest, and back hurt when she coughs.

## 2023-12-07 NOTE — Progress Notes (Signed)
Pt  received B12 injection in left deltoid. Pt tolerated it well with no concerns or complaints. 

## 2023-12-08 LAB — HEPATIC FUNCTION PANEL
ALT: 24 IU/L (ref 0–32)
AST: 24 IU/L (ref 0–40)
Albumin: 4.1 g/dL (ref 3.9–4.9)
Alkaline Phosphatase: 175 IU/L — ABNORMAL HIGH (ref 44–121)
Bilirubin Total: 0.2 mg/dL (ref 0.0–1.2)
Bilirubin, Direct: 0.08 mg/dL (ref 0.00–0.40)
Total Protein: 6.3 g/dL (ref 6.0–8.5)

## 2023-12-08 LAB — GAMMA GT: GGT: 60 IU/L (ref 0–60)

## 2023-12-17 ENCOUNTER — Other Ambulatory Visit: Payer: Self-pay

## 2023-12-17 DIAGNOSIS — J4550 Severe persistent asthma, uncomplicated: Secondary | ICD-10-CM

## 2023-12-17 MED ORDER — FLUTICASONE-SALMETEROL 500-50 MCG/ACT IN AEPB: 1.0000 | INHALATION_SPRAY | Freq: Two times a day (BID) | RESPIRATORY_TRACT | 11 refills | Status: DC

## 2023-12-17 NOTE — Progress Notes (Signed)
 Received a refill request from [facility] for Advair.  Advair has been sent in.  Nothing further needed.

## 2023-12-19 ENCOUNTER — Other Ambulatory Visit: Payer: Self-pay | Admitting: Internal Medicine

## 2023-12-19 DIAGNOSIS — R748 Abnormal levels of other serum enzymes: Secondary | ICD-10-CM

## 2023-12-19 NOTE — Progress Notes (Signed)
Order placed for bone scan.  

## 2023-12-20 ENCOUNTER — Telehealth: Payer: Self-pay

## 2023-12-20 ENCOUNTER — Encounter: Payer: Self-pay | Admitting: Pulmonary Disease

## 2023-12-20 ENCOUNTER — Telehealth: Payer: Self-pay | Admitting: Internal Medicine

## 2023-12-20 ENCOUNTER — Ambulatory Visit: Payer: BC Managed Care – PPO | Admitting: Pulmonary Disease

## 2023-12-20 VITALS — BP 138/98 | HR 105 | Resp 16 | Ht 66.0 in | Wt 184.8 lb

## 2023-12-20 DIAGNOSIS — J4551 Severe persistent asthma with (acute) exacerbation: Secondary | ICD-10-CM

## 2023-12-20 NOTE — Telephone Encounter (Signed)
 Lft pt vm to call ofc to sch [facility]. thanks

## 2023-12-20 NOTE — Progress Notes (Unsigned)
 Synopsis: Referred in by Dale Houston, MD   Subjective:   PATIENT ID: Heather [name] GENDER: female DOB: March 15, 1976, MRN: [id number]  Chief Complaint  Patient presents with  . Follow-up    Breathing was good until she got sick 3 weeks ago, gets better then gets worse over and over    HPI Expand All Collapse All    @Patient  ID: Heather [name], female    DOB: Oct 04, 1975, 49 y.o.   MRN: [id number]      Chief Complaint  Patient presents with  . Follow-up      Referring provider: Dale [place], MD   HPI: 48 year old female former smoker followed for moderate persistent asthma Medical history significant for long-haul COVID 2021 Medical history significant for seizure disorder, MGUS, IBS, anxiety, ADHD GERD   TEST/EVENTS :  December 2021 IgE 55, allergy panel positive for dust, dog and cat dander, grass and trees, absolute eosinophil count 100    PFT 04/2022 Pending unable to perform them given wheezing and chest tightness.    Chest x-ray January 2024 clear lungs Chest ray May 19, 2022 clear lungs   07/13/2023 Follow up ; Asthma  Patient presents for 1 year follow-up.  Patient has moderate persistent asthma.  Patient remains on Breztri inhaler twice daily.  She remains on Zyrtec and Singulair daily since last visit patient says her breathing has been doing better with less cough and wheezing . Feels advair worked better than breztri.  Patient was set up for PFTs last visit but unfortunately have not been completed. No increased albuterol /neb use .   Complains of 3-4 weeks of sinus congestion , drainage, scratchy throat, sinus pressure /teeth pain. Has been using OTC sinus meds without much help. Taking Zyrtec , Claritin and Flonase. Has not been taking Singulair .      08/17/2023 Follow up, Asthma, post covid bronchitis.   ROS All systems were reviewed and are negative except for the above.  Objective:   Vitals:   12/20/23 1426  BP: (!) 138/98  Pulse: (!)  105  Resp: 16  SpO2: 96%  Weight: 184 lb 12.8 oz (83.8 kg)  Height: 5\' 6"  (1.676 m)    96% on RA BMI Readings from Last 3 Encounters:  12/20/23 29.83 kg/m  12/07/23 29.05 kg/m  11/01/23 28.78 kg/m   Wt Readings from Last 3 Encounters:  12/20/23 184 lb 12.8 oz (83.8 kg)  12/07/23 180 lb (81.6 kg)  11/01/23 181 lb (82.1 kg)    Physical Exam GEN: NAD, coughing consistently with audible wheezing HEENT: Supple Neck, Reactive Pupils, EOMI  CVS: Normal S1, Normal S2, RRR, No murmurs or ES appreciated  Lungs: Clear bilateral air entry.  Abdomen: Soft, non tender, non distended, + BS  Extremities: Warm and well perfused, No edema  Skin: No suspicious lesions appreciated  Psych: Normal Affect  Ancillary Information   CBC    Component Value Date/Time   WBC 13.3 (H) 11/07/2023 0932   RBC 5.13 (H) 11/07/2023 0932   HGB 14.1 11/07/2023 0932   HGB 14.3 08/31/2023 1004   HGB 13.2 06/19/2014 1437   HCT 43.4 11/07/2023 0932   HCT 40.7 06/19/2014 1437   PLT 280.0 11/07/2023 0932   PLT 327 08/31/2023 1004   PLT 185 06/19/2014 1437   MCV 84.5 11/07/2023 0932   MCV 87 06/19/2014 1437   MCH 27.6 08/31/2023 1004   MCHC 32.4 11/07/2023 0932   RDW 15.0 11/07/2023 0932   RDW 14.2 06/19/2014 1437  LYMPHSABS 2.5 11/07/2023 0932   MONOABS 0.8 11/07/2023 0932   EOSABS 0.2 11/07/2023 0932   BASOSABS 0.1 11/07/2023 0932        No data to display           Assessment & Plan:   Moderate Persistent Asthma  Post COVID bronchitis. Acute sinusitis  Relayed that this is acute bronchitis and will take time to improve around 3 to 4 weeks. Plan as below.    Plan  Patient Instructions   Seitch Breztri to advair diskus 500 1 puff bid. Can use as needed during the day as well during this flare. Prescribed airsupra as needed.  Prednisone 40 mg PO daily for 5 days.  Continue on Zyrtec.  C/w Singulair 10mg  At bedtime  Saline nasal rinses  Twice daily    Flonase nasal 2 puffs BID  and Ipratropium Nasal spray 1 spray each nostril bid. Albuterol inhaler or Duoneb every 6hr as needed   PFTs once this episode improves.     RTC 3 months   I spent 40 minutes caring for this patient today, including preparing to see the patient, obtaining a medical history , reviewing a separately obtained history, performing a medically appropriate examination and/or evaluation, counseling and educating the patient/family/caregiver, ordering medications, tests, or procedures, documenting clinical information in the electronic health record, and independently interpreting results (not separately reported/billed) and communicating results to the patient/family/caregiver  Janann Colonel, MD [facility] 12/20/2023 2:29 PM

## 2023-12-20 NOTE — Telephone Encounter (Signed)
 Patient was seen in the office today. Dr. Larinda Buttery has ordered Dupixent for the patient. She has signed the form and it has been faxed to the pharmacy team for completion.

## 2023-12-21 ENCOUNTER — Telehealth: Payer: Self-pay | Admitting: Pharmacist

## 2023-12-21 NOTE — Telephone Encounter (Signed)
 Dupixent new start paperwork in Onbase  Submitted a Prior Authorization request to [employer] for DUPIXENT via CoverMyMeds. Will update once we receive a response.  Key: [id number]  Submitted a Prior Authorization request to [facility] for DUPIXENT via CoverMyMeds. Will update once we receive a response.  Key: [id number]

## 2023-12-21 NOTE — Telephone Encounter (Signed)
 Received. Will start Dupixent benefits investigation

## 2023-12-25 ENCOUNTER — Encounter: Payer: Self-pay | Admitting: Pulmonary Disease

## 2023-12-27 ENCOUNTER — Ambulatory Visit: Attending: Internal Medicine

## 2023-12-27 ENCOUNTER — Ambulatory Visit (INDEPENDENT_AMBULATORY_CARE_PROVIDER_SITE_OTHER): Payer: BC Managed Care – PPO | Admitting: Internal Medicine

## 2023-12-27 VITALS — BP 132/80 | HR 100 | Temp 97.9°F | Resp 16 | Ht 66.0 in | Wt 186.0 lb

## 2023-12-27 DIAGNOSIS — R87619 Unspecified abnormal cytological findings in specimens from cervix uteri: Secondary | ICD-10-CM

## 2023-12-27 DIAGNOSIS — J4541 Moderate persistent asthma with (acute) exacerbation: Secondary | ICD-10-CM

## 2023-12-27 DIAGNOSIS — Z8616 Personal history of COVID-19: Secondary | ICD-10-CM

## 2023-12-27 DIAGNOSIS — F419 Anxiety disorder, unspecified: Secondary | ICD-10-CM | POA: Diagnosis not present

## 2023-12-27 DIAGNOSIS — R002 Palpitations: Secondary | ICD-10-CM

## 2023-12-27 DIAGNOSIS — K219 Gastro-esophageal reflux disease without esophagitis: Secondary | ICD-10-CM

## 2023-12-27 DIAGNOSIS — R109 Unspecified abdominal pain: Secondary | ICD-10-CM | POA: Diagnosis not present

## 2023-12-27 DIAGNOSIS — Z0289 Encounter for other administrative examinations: Secondary | ICD-10-CM

## 2023-12-27 DIAGNOSIS — E7800 Pure hypercholesterolemia, unspecified: Secondary | ICD-10-CM

## 2023-12-27 DIAGNOSIS — D472 Monoclonal gammopathy: Secondary | ICD-10-CM

## 2023-12-27 DIAGNOSIS — G43809 Other migraine, not intractable, without status migrainosus: Secondary | ICD-10-CM

## 2023-12-27 DIAGNOSIS — K5900 Constipation, unspecified: Secondary | ICD-10-CM

## 2023-12-27 DIAGNOSIS — G40909 Epilepsy, unspecified, not intractable, without status epilepticus: Secondary | ICD-10-CM

## 2023-12-27 NOTE — Progress Notes (Signed)
 Subjective:    Patient ID: [name], female    DOB: 1976-01-20, 48 y.o.   MRN: [id number]  Patient here for  Chief Complaint  Patient presents with   Medical Management of Chronic Issues    HPI Here for a scheduled follow up. Follow up regarding asthma and asthma exacerbation. Seeing pulmonary. Planning to start dupixent. Also being followed at [facility] in the covid recovery clinic. Has had persistent fatigue and weakness. Last covid infection 06/2023. Last evaluated 12/25/23.  Recommended ST, PT (when able) and OT. Recently with migraine. Uses nurtec. Planning to f/u with headache clinic. Per review of their note, they did not feel she is able to return to work. Recommended pursuing social security disability. Discussed with her today. She provides me with paperwork today for extension of her short term. Continues on advair singulair and zyrtec. Also airsupra prn. She is using duoneb - multiple times per day. Feels this works better for her. She has an abdominal binder in placed - to help with increased cough and discomfort. Increased constipation. Discussed staying hydrated, fiber. Request f/u with Dr Allegra Lai to discuss.    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 hauler    Depression    bipolar   Dysrhythmia    Fainting spell    H/O   H/O cardiac arrhythmia    Migraines    Seizures (HCC)    Past Surgical History:  Procedure Laterality Date   COLONOSCOPY WITH PROPOFOL N/A 06/03/2021   Procedure: COLONOSCOPY WITH PROPOFOL;  Surgeon: Pasty Spillers, MD;  Location: [facility] ENDOSCOPY;  Service: Endoscopy;  Laterality: N/A;   ESOPHAGOGASTRODUODENOSCOPY N/A 06/03/2021   Procedure: ESOPHAGOGASTRODUODENOSCOPY (EGD);  Surgeon: Pasty Spillers, MD;  Location: [facility] ENDOSCOPY;  Service: Endoscopy;  Laterality: N/A;   TUBAL LIGATION Bilateral 09/25/2002   tubal reversal     Family History  Problem Relation Age of Onset   Hypertension Mother    Diabetes Mother     Arthritis Mother    Heart disease Mother    Breast cancer Neg Hx    Social History   Socioeconomic History   Marital status: Married    Spouse name: Not on file   Number of children: Not on file   Years of education: Not on file   Highest education level: Not on file  Occupational History   Not on file  Tobacco Use   Smoking status: Former    Current packs/day: 0.00    Average packs/day: 0.5 packs/day for 5.0 years (2.5 ttl pk-yrs)    Types: Cigarettes    Start date: 2008    Quit date: 2013    Years since quitting: 12.2   Smokeless tobacco: Never   Tobacco comments:    Quit mid October 2014  Vaping Use   Vaping status: Never Used  Substance and Sexual Activity   Alcohol use: No    Alcohol/week: 0.0 standard drinks of alcohol   Drug use: No   Sexual activity: Yes    Birth control/protection: None  Other Topics Concern   Not on file  Social History Narrative   Not on file   Social Drivers of Health   Financial Resource Strain: Medium Risk (10/30/2023)   Overall Financial Resource Strain (CARDIA)    Difficulty of Paying Living Expenses: Somewhat hard  Food Insecurity: Food Insecurity Present (10/30/2023)   Hunger Vital Sign    Worried About Running Out of Food in the Last Year:  Sometimes true    Ran Out of Food in the Last Year: Sometimes true  Transportation Needs: Unknown (10/30/2023)   PRAPARE - Transportation    Lack of Transportation (Medical): No    Lack of Transportation (Non-Medical): Patient declined  Physical Activity: Unknown (10/30/2023)   Exercise Vital Sign    Days of Exercise per Week: Patient declined    Minutes of Exercise per Session: Not on file  Stress: Stress Concern Present (10/30/2023)   [employer] of Occupational Health - Occupational Stress Questionnaire    Feeling of Stress : To some extent  Social Connections: Unknown (10/30/2023)   Social Connection and Isolation Panel [NHANES]    Frequency of Communication with Friends and Family:  Patient declined    Frequency of Social Gatherings with Friends and Family: Never    Attends Religious Services: Never    Database administrator or Organizations: No    Attends Engineer, structural: Not on file    Marital Status: Patient declined     Review of Systems  Constitutional:  Negative for appetite change and unexpected weight change.  HENT:  Positive for congestion. Negative for sinus pressure.   Respiratory:  Positive for cough and wheezing. Negative for chest tightness.        SOB associated with flares.   Cardiovascular:  Negative for chest pain and palpitations.  Gastrointestinal:  Positive for constipation. Negative for nausea and vomiting.       Abdomen - feels hard/firm at times.   Genitourinary:  Negative for difficulty urinating and dysuria.  Musculoskeletal:  Negative for joint swelling and myalgias.  Skin:  Negative for color change and rash.  Neurological:  Negative for dizziness.       History of migraine headaches.   Psychiatric/Behavioral:  Negative for agitation.        Increased stress related to her current medical issues and work       Objective:     BP 132/80   Pulse 100   Temp 97.9 F (36.6 C)   Resp 16   Ht 5\' 6"  (1.676 m)   Wt 186 lb (84.4 kg)   LMP 11/30/2023   SpO2 99%   BMI 30.02 kg/m  Wt Readings from Last 3 Encounters:  12/27/23 186 lb (84.4 kg)  12/20/23 184 lb 12.8 oz (83.8 kg)  12/07/23 180 lb (81.6 kg)    Physical Exam Vitals reviewed.  Constitutional:      General: She is not in acute distress.    Appearance: Normal appearance.  HENT:     Head: Normocephalic and atraumatic.     Right Ear: External ear normal.     Left Ear: External ear normal.  Eyes:     General: No scleral icterus.       Right eye: No discharge.        Left eye: No discharge.     Conjunctiva/sclera: Conjunctivae normal.  Neck:     Thyroid: No thyromegaly.  Cardiovascular:     Rate and Rhythm: Normal rate and regular rhythm.   Pulmonary:     Effort: No respiratory distress.     Comments: Some increased cough with forced expiration.  Abdominal:     General: Bowel sounds are normal.     Palpations: Abdomen is soft.     Comments: No increased tenderness to palpation.   Musculoskeletal:        General: No swelling or tenderness.     Cervical back: Neck supple. No tenderness.  Lymphadenopathy:     Cervical: No cervical adenopathy.  Skin:    Findings: No erythema or rash.  Neurological:     Mental Status: [name].  Psychiatric:        Mood and Affect: Mood normal.        Behavior: Behavior normal.         Outpatient Encounter Medications as of 12/27/2023  Medication Sig   Acetylcysteine 600 MG TBCR Take by mouth.   AJOVY 225 MG/1.5ML SOAJ Inject into the skin.   albuterol (VENTOLIN HFA) 108 (90 Base) MCG/ACT inhaler INHALE 1 TO 2 PUFFS BY MOUTH EVERY 4 HOURS AS NEEDED FOR WHEEZING FOR SHORTNESS OF BREATH   Albuterol-Budesonide (AIRSUPRA) 90-80 MCG/ACT AERO Inhale 2 puffs into the lungs every 6 (six) hours.   amphetamine-dextroamphetamine (ADDERALL XR) 20 MG 24 hr capsule Take 20 mg by mouth every morning.   amphetamine-dextroamphetamine (ADDERALL) 20 MG tablet Take 20 mg by mouth daily.   ascorbic acid (VITAMIN C) 1000 MG tablet Take by mouth.   benzonatate (TESSALON) 100 MG capsule Take 1 capsule (100 mg total) by mouth every 8 (eight) hours.   cetirizine (ZYRTEC) 10 MG tablet Take 1 tablet by mouth once daily   cyanocobalamin (VITAMIN B12) 1000 MCG/ML injection Inject into the muscle.   diazepam (VALIUM) 10 MG tablet Take 1 tablet by mouth 4 (four) times daily as needed.   divalproex (DEPAKOTE) 500 MG DR tablet Take by mouth.   fluticasone (FLONASE) 50 MCG/ACT nasal spray Place 1 spray into both nostrils daily.   fluticasone-salmeterol (ADVAIR) 500-50 MCG/ACT AEPB Inhale 1 puff into the lungs in the morning and at bedtime.   Galcanezumab-gnlm 120 MG/ML SOAJ Inject into the skin.   guaiFENesin  (MUCINEX) 600 MG 12 hr tablet Take 2 tablets (1,200 mg total) by mouth 2 (two) times daily as needed.   guaiFENesin-codeine 100-10 MG/5ML syrup Take 5 mLs by mouth every 6 (six) hours as needed for cough.   ipratropium (ATROVENT) 0.03 % nasal spray Place 2 sprays into both nostrils every 12 (twelve) hours.   ipratropium-albuterol (DUONEB) 0.5-2.5 (3) MG/3ML SOLN Inhale 3 mLs into the lungs every 4 (four) hours as needed.   loratadine (CLARITIN) 10 MG tablet Take 10 mg by mouth daily.   magnesium oxide (MAG-OX) 400 MG tablet Take 1 tablet (400 mg total) by mouth daily.   montelukast (SINGULAIR) 10 MG tablet Take 1 tablet (10 mg total) by mouth at bedtime.   omeprazole (PRILOSEC) 40 MG capsule Take 1 capsule (40 mg total) by mouth daily before breakfast.   ondansetron (ZOFRAN-ODT) 4 MG disintegrating tablet DISSOLVE 1 TABLET IN MOUTH TWICE DAILY AS NEEDED FOR NAUSEA FOR VOMITING   potassium chloride SA (KLOR-CON M) 20 MEQ tablet Take 1 tablet (20 mEq total) by mouth 2 (two) times daily.   Rimegepant Sulfate (NURTEC) 75 MG TBDP Take by mouth.   UBRELVY 100 MG TABS Take by mouth.   [DISCONTINUED] naltrexone (DEPADE) 50 MG tablet Dissolve 1 tablet in 50 mL distilled water.  Store in refrigerator for up to 2 weeks.  Directions: 1.5 mL by mouth nightly x 14 days, then increase to 3.0 mL x 14 days, then increase to 4.5 mL. (Patient not taking: Reported on 12/27/2023)   [DISCONTINUED] predniSONE (STERAPRED UNI-PAK 21 TAB) 10 MG (21) TBPK tablet Take by mouth daily. Take 6 tabs by mouth daily  for 1 days, then 5 tabs for 1 days, then 4 tabs for 1 days, then 3 tabs  for 1 days, 2 tabs for 1 days, then 1 tab by mouth daily for 1 days   No facility-administered encounter medications on file as of 12/27/2023.     Lab Results  Component Value Date   WBC 13.3 (H) 11/07/2023   HGB 14.1 11/07/2023   HCT 43.4 11/07/2023   PLT 280.0 11/07/2023   GLUCOSE 98 11/07/2023   CHOL 199 11/07/2023   TRIG 294.0 (H)  11/07/2023   HDL 38.60 (L) 11/07/2023   LDLDIRECT 136.0 07/10/2022   LDLCALC 101 (H) 11/07/2023   ALT 24 12/07/2023   AST 24 12/07/2023   NA 139 11/07/2023   K 3.7 11/07/2023   CL 102 11/07/2023   CREATININE 0.69 11/07/2023   BUN 8 11/07/2023   CO2 28 11/07/2023   TSH 2.26 11/07/2023       Assessment & Plan:  Palpitations -     LONG TERM MONITOR (3-14 DAYS); Future  Abdominal pain, unspecified abdominal location Assessment & Plan: Some increased discomfort associated with increased constipation and coughing. Discussed keeping bowels moving. Request f/u with Dr Allegra Lai.    Abnormal cervical Papanicolaou smear, unspecified abnormal pap finding Assessment & Plan: Saw Dr Logan Bores 02/2022 - recommended f/u pap in one year. Appears to be overdue f/u. Need to schedule.    Anxiety Assessment & Plan: Has been followed by psychiatry.    Moderate persistent asthma with acute exacerbation Assessment & Plan: Saw pulmonary 11/2023 - recommended to: C/w advair diskus 500 1 puff bid. Can use as needed during the day as well during this flare. Prescribed airsupra as needed.  Will start Dupixent.  C/w Singulair 10mg  At bedtime  Saline nasal rinses  Twice daily    Flonase nasal 2 puffs BID and Ipratropium Nasal spray 1 spray each nostril bid. Albuterol inhaler or Duoneb every 6hr as needed   PFTs once this episode improves.   Constipation, unspecified constipation type Assessment & Plan: Has previously been on linzess - seeing Dr Allegra Lai. Request referral back. Persistent problems.    Encounter for completion of form with patient Assessment & Plan: Persistent symptoms and limitations as outlined. Still with cough. Is better, but still limitations with talking and activity as outlined.  Being followed - long haul covid clinic. Recommendations as outlined. Form for extension of short term disability.    Gastroesophageal reflux disease, unspecified whether esophagitis present Assessment &  Plan: No increased acid reflux reported.    History of COVID-19 Assessment & Plan: She is being followed - covid clinic. Last covid infection 06/2023. Last evaluated 12/25/23.  Recommended ST, PT (when able) and OT. Recently with migraine. Uses nurtec. Planning to f/u with headache clinic. Per review of their note, they did not feel she is able to return to work. Recommended pursuing social security disability. Discussed with her today. She provides me with paperwork today for extension of her short term.    Hypercholesterolemia Assessment & Plan: Low cholesterol diet and exercise.  Follow lipid panel.    MGUS (monoclonal gammopathy of unknown significance) Assessment & Plan: Seeing Dr Cathie Hoops for f/u MGUS. Evaluated 09/14/23.  Stable.  Recommended check SPEP and light chain ratio - 6 months - observation. Recent persistent elevated alkaline phos. GGT normal. Scheduled for bone scan.    Other migraine without status migrainosus, not intractable Assessment & Plan: Seeing neurology.  Plans to f/u headache clinic.    Seizure disorder [facility]) Assessment & Plan: Being followed by neurology.  No recent seizures.     I spent  45 minutes with the patient. Time spent discussing her current concerns and symptoms.  Specifically time spent discussing her current breathing issues and bowel issues as well as work. Time also spent discussing further w/up, evaluation and treatment.    Dale Troy, MD

## 2023-12-30 ENCOUNTER — Telehealth: Payer: Self-pay | Admitting: Internal Medicine

## 2023-12-30 ENCOUNTER — Encounter: Payer: Self-pay | Admitting: Internal Medicine

## 2023-12-30 NOTE — Assessment & Plan Note (Signed)
 Has previously been on linzess - seeing Dr Allegra Lai. Request referral back. Persistent problems.

## 2023-12-30 NOTE — Assessment & Plan Note (Signed)
 Low cholesterol diet and exercise.  Follow lipid panel.

## 2023-12-30 NOTE — Assessment & Plan Note (Signed)
 Seeing Dr Cathie Hoops for f/u MGUS. Evaluated 09/14/23.  Stable.  Recommended check SPEP and light chain ratio - 6 months - observation. Recent persistent elevated alkaline phos. GGT normal. Scheduled for bone scan.

## 2023-12-30 NOTE — Assessment & Plan Note (Signed)
 Being followed by neurology.  No recent seizures.

## 2023-12-30 NOTE — Telephone Encounter (Signed)
 Has seen Dr Allegra Lai for bowel issues and constipation. Needs a f/u appt with her to discuss increased constipation.

## 2023-12-30 NOTE — Assessment & Plan Note (Signed)
 Some increased discomfort associated with increased constipation and coughing. Discussed keeping bowels moving. Request f/u with Dr Allegra Lai.

## 2023-12-30 NOTE — Assessment & Plan Note (Signed)
 Saw Dr Logan Bores 02/2022 - recommended f/u pap in one year. Appears to be overdue f/u. Need to schedule.

## 2023-12-30 NOTE — Assessment & Plan Note (Signed)
 Persistent symptoms and limitations as outlined. Still with cough. Is better, but still limitations with talking and activity as outlined.  Being followed - long haul covid clinic. Recommendations as outlined. Form for extension of short term disability.

## 2023-12-30 NOTE — Assessment & Plan Note (Signed)
No increased acid reflux reported.

## 2023-12-30 NOTE — Assessment & Plan Note (Signed)
Has been followed by psychiatry.

## 2023-12-30 NOTE — Assessment & Plan Note (Signed)
 Saw pulmonary 11/2023 - recommended to: C/w advair diskus 500 1 puff bid. Can use as needed during the day as well during this flare. Prescribed airsupra as needed.  Will start Dupixent.  C/w Singulair 10mg  At bedtime  Saline nasal rinses  Twice daily    Flonase nasal 2 puffs BID and Ipratropium Nasal spray 1 spray each nostril bid. Albuterol inhaler or Duoneb every 6hr as needed   PFTs once this episode improves.

## 2023-12-30 NOTE — Assessment & Plan Note (Signed)
 She is being followed - covid clinic. Last covid infection 06/2023. Last evaluated 12/25/23.  Recommended ST, PT (when able) and OT. Recently with migraine. Uses nurtec. Planning to f/u with headache clinic. Per review of their note, they did not feel she is able to return to work. Recommended pursuing social security disability. Discussed with her today. She provides me with paperwork today for extension of her short term.

## 2023-12-30 NOTE — Assessment & Plan Note (Signed)
 Seeing neurology.  Plans to f/u headache clinic.

## 2023-12-31 ENCOUNTER — Other Ambulatory Visit: Payer: Self-pay | Admitting: Internal Medicine

## 2023-12-31 ENCOUNTER — Other Ambulatory Visit (HOSPITAL_COMMUNITY): Payer: Self-pay

## 2023-12-31 NOTE — Telephone Encounter (Signed)
 Received notification from [facility] regarding a prior authorization for DUPIXENT. Authorization has been APPROVED from 12/21/2023 to 06/22/2024. Approval letter sent to scan center.  Unable to run test claim,  Patient must fill through [facility]: [phone]   Authorization # [id number]  Received notification from [employer] regarding a prior authorization for DUPIXENT. Authorization has been APPROVED from 11/21/2023 to 01/18/2024. Approval letter sent to scan center.  Patient must fill through [facility]: [phone]  Authorization # [id number]  Enrolled patient into Dupixent copay card: BIN: [id number] PCN: [id number] Group: [id number] ID: [id number]  ATC patient to schedule Dupixent new start. Unable to reach. Left VM with callback number. MyChart message sent  Chesley Mires, PharmD, MPH, BCPS, CPP Clinical Pharmacist (Rheumatology and Pulmonology)

## 2024-01-01 ENCOUNTER — Encounter
Admission: RE | Admit: 2024-01-01 | Discharge: 2024-01-01 | Disposition: A | Discharge status: Home / Self Care | Source: Ambulatory Visit | Attending: Internal Medicine | Admitting: Internal Medicine

## 2024-01-01 DIAGNOSIS — R748 Abnormal levels of other serum enzymes: Secondary | ICD-10-CM | POA: Diagnosis present

## 2024-01-01 MED ORDER — BENZONATATE 100 MG PO CAPS: 100.0000 mg | ORAL_CAPSULE | Freq: Three times a day (TID) | ORAL | 0 refills | Status: DC

## 2024-01-01 MED ORDER — TECHNETIUM TC 99M MEDRONATE IV KIT
20.0000 | PACK | Freq: Once | INTRAVENOUS | Status: AC | PRN
  Administered 2024-01-01: 21.83 via INTRAVENOUS

## 2024-01-01 MED ORDER — ONDANSETRON 4 MG PO TBDP: 4.0000 mg | ORAL_TABLET | Freq: Two times a day (BID) | ORAL | 0 refills | Status: DC | PRN

## 2024-01-02 ENCOUNTER — Telehealth: Payer: Self-pay | Admitting: Gastroenterology

## 2024-01-02 NOTE — Telephone Encounter (Signed)
 Bethann Berkshire from [facility] called to schedule an appointment with Dr. Allegra Lai.

## 2024-01-04 ENCOUNTER — Ambulatory Visit (INDEPENDENT_AMBULATORY_CARE_PROVIDER_SITE_OTHER): Admitting: Pharmacist

## 2024-01-04 DIAGNOSIS — Z87891 Personal history of nicotine dependence: Secondary | ICD-10-CM | POA: Diagnosis not present

## 2024-01-04 DIAGNOSIS — J4550 Severe persistent asthma, uncomplicated: Secondary | ICD-10-CM | POA: Diagnosis not present

## 2024-01-04 DIAGNOSIS — Z7189 Other specified counseling: Secondary | ICD-10-CM | POA: Diagnosis not present

## 2024-01-04 MED ORDER — DUPIXENT 300 MG/2ML ~~LOC~~ SOAJ: 300.0000 mg | SUBCUTANEOUS | 1 refills | Status: DC

## 2024-01-04 MED ORDER — MONTELUKAST SODIUM 10 MG PO TABS
10.0000 mg | ORAL_TABLET | Freq: Every day | ORAL | 11 refills | Status: AC
Start: 2024-01-04 — End: ?

## 2024-01-04 NOTE — Telephone Encounter (Signed)
 Pt scheduled for virtual to discuss bone scan results.

## 2024-01-04 NOTE — Progress Notes (Addendum)
 HPI Patient presents today to [facility] to see pharmacy team for Dupixent new start for severe persistent steroid-dependent asthma. She states that her daughter who is in college has been on Dupixent for many years for atopic dermatitis and asthma which has helped. Patient is eager but slightly nervous to start Dupixent for her asthma (because of her daughter's discomfort with injection).   Respiratory Medications Current regimen: Advair 500-25mcg (1 puff twice daily), montelukast 10mg  nightly Patient reports no known adherence challenges  OBJECTIVE Allergies  Allergen Reactions   Levaquin [Levofloxacin] Swelling   Tramadol Other (See Comments)    Seizures   Amoxicillin Nausea And Vomiting   Augmentin [Amoxicillin-Pot Clavulanate] Other (See Comments)   Erythromycin Nausea And Vomiting   Lithium Nausea Only    Outpatient Encounter Medications as of 01/04/2024  Medication Sig   Acetylcysteine 600 MG TBCR Take by mouth.   AJOVY 225 MG/1.5ML SOAJ Inject into the skin.   albuterol (VENTOLIN HFA) 108 (90 Base) MCG/ACT inhaler INHALE 1 TO 2 PUFFS BY MOUTH EVERY 4 HOURS AS NEEDED FOR WHEEZING FOR SHORTNESS OF BREATH   Albuterol-Budesonide (AIRSUPRA) 90-80 MCG/ACT AERO Inhale 2 puffs into the lungs every 6 (six) hours.   amphetamine-dextroamphetamine (ADDERALL XR) 20 MG 24 hr capsule Take 20 mg by mouth every morning.   amphetamine-dextroamphetamine (ADDERALL) 20 MG tablet Take 20 mg by mouth daily.   ascorbic acid (VITAMIN C) 1000 MG tablet Take by mouth.   benzonatate (TESSALON) 100 MG capsule Take 1 capsule (100 mg total) by mouth every 8 (eight) hours.   cetirizine (ZYRTEC) 10 MG tablet Take 1 tablet by mouth once daily   cyanocobalamin (VITAMIN B12) 1000 MCG/ML injection Inject into the muscle.   diazepam (VALIUM) 10 MG tablet Take 1 tablet by mouth 4 (four) times daily as needed.   divalproex (DEPAKOTE) 500 MG DR tablet Take by mouth.   fluticasone (FLONASE) 50 MCG/ACT nasal  spray Place 1 spray into both nostrils daily.   fluticasone-salmeterol (ADVAIR) 500-50 MCG/ACT AEPB Inhale 1 puff into the lungs in the morning and at bedtime.   Galcanezumab-gnlm 120 MG/ML SOAJ Inject into the skin.   guaiFENesin (MUCINEX) 600 MG 12 hr tablet Take 2 tablets (1,200 mg total) by mouth 2 (two) times daily as needed.   guaiFENesin-codeine 100-10 MG/5ML syrup Take 5 mLs by mouth every 6 (six) hours as needed for cough.   ipratropium (ATROVENT) 0.03 % nasal spray Place 2 sprays into both nostrils every 12 (twelve) hours.   ipratropium-albuterol (DUONEB) 0.5-2.5 (3) MG/3ML SOLN Inhale 3 mLs into the lungs every 4 (four) hours as needed.   loratadine (CLARITIN) 10 MG tablet Take 10 mg by mouth daily.   magnesium oxide (MAG-OX) 400 MG tablet Take 1 tablet (400 mg total) by mouth daily.   montelukast (SINGULAIR) 10 MG tablet Take 1 tablet (10 mg total) by mouth at bedtime.   omeprazole (PRILOSEC) 40 MG capsule Take 1 capsule (40 mg total) by mouth daily before breakfast.   ondansetron (ZOFRAN-ODT) 4 MG disintegrating tablet Take 1 tablet (4 mg total) by mouth 2 (two) times daily as needed for nausea or vomiting.   potassium chloride SA (KLOR-CON M) 20 MEQ tablet Take 1 tablet (20 mEq total) by mouth 2 (two) times daily.   Rimegepant Sulfate (NURTEC) 75 MG TBDP Take by mouth.   UBRELVY 100 MG TABS Take by mouth.   No facility-administered encounter medications on file as of 01/04/2024.     Immunization History  Administered  Date(s) Administered   PPD Test 07/20/2015     PFTs     No data to display          Eosinophils Most recent blood eosinophil count was 200 cells/microL taken on 11/07/2023.   IgE: 66 on 06/29/2022  Assessment   Biologics training for dupilumab (Dupixent)  Goals of therapy: Mechanism: human monoclonal IgG4 antibody that inhibits interleukin-4 and interleukin-13 cytokine-induced responses, including release of proinflammatory cytokines, chemokines, and  IgE Reviewed that Dupixent is add-on medication and patient must continue maintenance inhaler regimen. Response to therapy: may take 4 months to determine efficacy. Discussed that patients generally feel improvement sooner than 4 months.  Side effects: injection site reaction (6-18%), antibody development (5-16%), ophthalmic conjunctivitis (2-16%), transient blood eosinophilia (1-2%)  Dose: 600mg  at Week 0 (administered today in clinic) followed by 300mg  every 14 days thereafter  Administration/Storage:  Reviewed administration sites of thigh or abdomen (at least 2-3 inches away from abdomen). Reviewed the upper arm is only appropriate if caregiver is administering injection  Do not shake pen/syringe as this could lead to product foaming or precipitation. Do not use if solution is discolored or contains particulate matter or if window on prefilled pen is yellow (indicates pen has been used).  Reviewed storage of medication in refrigerator. Reviewed that Dupixent can be stored at room temperature in unopened carton for up to 14 days.  Access: Approval of Dupixent through: insurance Patient enrolled into copay card program to help with copay assistance.  Patient self-administered Dupixent 300mg /63ml x 2 (total dose 600mg ) in right lower abdomen and left lower abdomen using sample Dupixent 300mg /43mL autoinjector pen NDC: [phone] Lot: [id number] Expiration: 09/24/2025  Patient monitored for 30 minutes for adverse reaction.  Patient tolerated well.  Injection site noted. Patient denies itchiness and irritation at injection., No swelling or redness noted., and Reviewed injection site reaction management with patient verbally and printed information for review in AVS  Medication Reconciliation  A drug regimen assessment was performed, including review of allergies, interactions, disease-state management, dosing and immunization history. Medications were reviewed with the patient, including  name, instructions, indication, goals of therapy, potential side effects, importance of adherence, and safe use.  Drug interaction(s): none noted  PLAN Continue Dupixent 300mg  every 14 days.  Next dose is due 01/18/24 and every 14 days thereafter. Rx sent to: [facility]: [phone].  Patient provided with pharmacy phone number and advised to call early next week to schedule shipment to home. Patient provided with copay card information to provide to pharmacy if quoted copay exceeds $5 per month. Continue maintenance inhaler regimen of: Advair 500-77mcg (1 puff twice daily), montelukast 10mg  nightly  All questions encouraged and answered.  Instructed patient to reach out with any further questions or concerns.  Thank you for allowing pharmacy to participate in this patient's care.  This appointment required 45 minutes of patient care (this includes precharting, chart review, review of results, face-to-face care, etc.).   Chesley Mires, PharmD, MPH, BCPS, CPP Clinical Pharmacist (Rheumatology and Pulmonology)

## 2024-01-04 NOTE — Patient Instructions (Signed)
 Your next DUPIXENT dose is due on 01/18/24, 02/01/24, and every 14 days thereafter  CONTINUE Advair (1 puff twice daily) and montelukast 10mg  nightly  Your prescription will be shipped from [facility]. Their phone number is [phone] Please call to schedule shipment and confirm address. They will mail your medication to your home.  Your copay should be affordable. If you call the pharmacy and it is not affordable, please double-check that they are billing through your copay card as secondary coverage. That copay card information is: BIN: [id number] PCN: [id number] Group: [id number] ID: [id number]  You will need to be seen by your provider in 3 to 4 months to assess how DUPIXENT is working for you. Please ensure you have a follow-up appointment scheduled in July or August 2025. Call our clinic if you need to make this appointment.  Stay up to date on all routine vaccines: influenza, pneumonia, COVID19, Shingles  How to manage an injection site reaction: Remember the 5 C's: COUNTER - leave on the counter at least 30 minutes but up to overnight to bring medication to room temperature. This may help prevent stinging COLD - place something cold (like an ice gel pack or cold water bottle) on the injection site just before cleansing with alcohol. This may help reduce pain CLARITIN - use Claritin (generic name is loratadine) for the first two weeks of treatment or the day of, the day before, and the day after injecting. This will help to minimize injection site reactions CORTISONE CREAM - apply if injection site is irritated and itching CALL ME - if injection site reaction is bigger than the size of your fist, looks infected, blisters, or if you develop hives

## 2024-01-10 ENCOUNTER — Ambulatory Visit

## 2024-01-10 DIAGNOSIS — E538 Deficiency of other specified B group vitamins: Secondary | ICD-10-CM | POA: Diagnosis not present

## 2024-01-10 MED ORDER — CYANOCOBALAMIN 1000 MCG/ML IJ SOLN
1000.0000 ug | Freq: Once | INTRAMUSCULAR | Status: AC
  Administered 2024-01-10: 1000 ug via INTRAMUSCULAR

## 2024-01-10 NOTE — Progress Notes (Signed)
 Patient presented for B 12 injection to left deltoid, patient voiced no concerns nor showed any signs of distress during injection.

## 2024-01-13 NOTE — Progress Notes (Signed)
 Patient ID: Heather [name], female   DOB: 1976/04/30, 48 y.o.   MRN: [id number]   Virtual Visit via video Note  I connected with Eluterio Hamburg by a video enabled telemedicine application and verified that I am speaking with the correct person using two identifiers. Location patient: home Location provider: work Persons participating in the virtual visit: patient, provider  The limitations, risks, security and privacy concerns of performing an evaluation and management service by video and the availability of in person appointments have been discussed. It has also been discussed with the patient that there may be a patient responsible charge related to this service. The patient expressed understanding and agreed to proceed.   Reason for visit: work in appt  HPI: Work in appt - work in to discuss recent bone scan results. She had persistent elevation in alk phos. Given GGT normal, bone scan ordered. Discussed bone scan results - no evidence of metastatic disease. She had questions about the finding - multifocal radiotracer uptake involving the shoulders and right ankle in a pattern c/w degenerative arthropathy. Questions answered. She is being followed - covid clinic. Working with therapy - OT. Discussed [facility] PT evaluation in future. Continues to have increased cough and breathing issues. Continues to follow up with pulmonary.    ROS: See pertinent positives and negatives per HPI.  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 hauler    Depression    bipolar   Dysrhythmia    Fainting spell    H/O   H/O cardiac arrhythmia    Migraines    Seizures (HCC)     Past Surgical History:  Procedure Laterality Date   COLONOSCOPY WITH PROPOFOL  N/A 06/03/2021   Procedure: COLONOSCOPY WITH PROPOFOL ;  Surgeon: Irby Mannan, MD;  Location: [facility] ENDOSCOPY;  Service: Endoscopy;  Laterality: N/A;   ESOPHAGOGASTRODUODENOSCOPY N/A 06/03/2021   Procedure:  ESOPHAGOGASTRODUODENOSCOPY (EGD);  Surgeon: Irby Mannan, MD;  Location: [facility] ENDOSCOPY;  Service: Endoscopy;  Laterality: N/A;   TUBAL LIGATION Bilateral 09/25/2002   tubal reversal      Family History  Problem Relation Age of Onset   Hypertension Mother    Diabetes Mother    Arthritis Mother    Heart disease Mother    Breast cancer Neg Hx     SOCIAL HX: reviewed.    Current Outpatient Medications:    Acetylcysteine 600 MG TBCR, Take by mouth., Disp: , Rfl:    AJOVY 225 MG/1.5ML SOAJ, Inject into the skin., Disp: , Rfl:    albuterol  (VENTOLIN  HFA) 108 (90 Base) MCG/ACT inhaler, INHALE 1 TO 2 PUFFS BY MOUTH EVERY 4 HOURS AS NEEDED FOR WHEEZING FOR SHORTNESS OF BREATH, Disp: 18 g, Rfl: 1   Albuterol -Budesonide  (AIRSUPRA ) 90-80 MCG/ACT AERO, Inhale 2 puffs into the lungs every 6 (six) hours., Disp: 1 g, Rfl: 3   amphetamine-dextroamphetamine (ADDERALL XR) 20 MG 24 hr capsule, Take 20 mg by mouth every morning., Disp: , Rfl:    amphetamine-dextroamphetamine (ADDERALL) 20 MG tablet, Take 20 mg by mouth daily., Disp: , Rfl:    ascorbic acid (VITAMIN C) 1000 MG tablet, Take by mouth., Disp: , Rfl:    benzonatate  (TESSALON ) 100 MG capsule, Take 1 capsule (100 mg total) by mouth every 8 (eight) hours., Disp: 21 capsule, Rfl: 0   cetirizine  (ZYRTEC ) 10 MG tablet, Take 1 tablet by mouth once daily, Disp: 90 tablet, Rfl: 3   cyanocobalamin  (VITAMIN B12) 1000 MCG/ML injection, Inject into the  muscle., Disp: , Rfl:    diazepam (VALIUM) 10 MG tablet, Take 1 tablet by mouth 4 (four) times daily as needed., Disp: , Rfl:    divalproex (DEPAKOTE) 500 MG DR tablet, Take by mouth., Disp: , Rfl:    Dupilumab (DUPIXENT) 300 MG/2ML SOAJ, Inject 300 mg into the skin every 14 (fourteen) days. **loading dose completed in clinic on 01/04/2024**, Disp: 12 mL, Rfl: 1   fluticasone  (FLONASE ) 50 MCG/ACT nasal spray, Place 1 spray into both nostrils daily., Disp: 48 g, Rfl: 1   fluticasone -salmeterol (ADVAIR)  500-50 MCG/ACT AEPB, Inhale 1 puff into the lungs in the morning and at bedtime., Disp: 60 each, Rfl: 11   guaiFENesin  (MUCINEX ) 600 MG 12 hr tablet, Take 2 tablets (1,200 mg total) by mouth 2 (two) times daily as needed., Disp: 60 tablet, Rfl: 1   ipratropium (ATROVENT ) 0.03 % nasal spray, Place 2 sprays into both nostrils every 12 (twelve) hours., Disp: 30 mL, Rfl: 12   ipratropium-albuterol  (DUONEB) 0.5-2.5 (3) MG/3ML SOLN, Inhale 3 mLs into the lungs every 4 (four) hours as needed., Disp: 360 mL, Rfl: 11   loratadine (CLARITIN) 10 MG tablet, Take 10 mg by mouth daily., Disp: , Rfl:    magnesium  oxide (MAG-OX) 400 MG tablet, Take 1 tablet (400 mg total) by mouth daily., Disp: 30 tablet, Rfl: 2   montelukast  (SINGULAIR ) 10 MG tablet, Take 1 tablet (10 mg total) by mouth at bedtime., Disp: 30 tablet, Rfl: 11   ondansetron  (ZOFRAN -ODT) 4 MG disintegrating tablet, Take 1 tablet (4 mg total) by mouth 2 (two) times daily as needed for nausea or vomiting., Disp: 20 tablet, Rfl: 0   Rimegepant Sulfate (NURTEC) 75 MG TBDP, Take by mouth., Disp: , Rfl:    UBRELVY 100 MG TABS, Take by mouth., Disp: , Rfl:    linaclotide  (LINZESS ) 290 MCG CAPS capsule, Take 1 capsule (290 mcg total) by mouth daily before breakfast., Disp: 30 capsule, Rfl: 1   omeprazole  (PRILOSEC) 40 MG capsule, Take 1 capsule (40 mg total) by mouth daily before breakfast., Disp: 30 capsule, Rfl: 3   polyethylene glycol (MIRALAX / GLYCOLAX) 17 g packet, Take 17 g by mouth 2 (two) times daily., Disp: , Rfl:   EXAM:  GENERAL: alert, oriented, appears well and in no acute distress  HEENT: atraumatic, conjunttiva clear, no obvious abnormalities on inspection of external nose and ears  NECK: normal movements of the head and neck  LUNGS: on inspection no signs of respiratory distress, cough with increased talking.   CV: no obvious cyanosis  PSYCH/NEURO: pleasant and cooperative, no obvious depression or anxiety, speech and thought  processing grossly intact  ASSESSMENT AND PLAN:  Discussed the following assessment and plan:  Problem List Items Addressed This Visit     Asthma - Primary   Saw pulmonary 11/2023 - recommended to: C/w advair diskus 500 1 puff bid. Can use as needed during the day as well during flares.  Prescribed airsupra  as needed.  Dupixent.  C/w Singulair  10mg  At bedtime  Saline nasal rinses  Twice daily    Flonase  nasal 2 puffs BID and Ipratropium Nasal spray 1 spray each nostril bid. Albuterol  inhaler or Duoneb every 6hr as needed       Cough (Chronic)   Seeing pulmonary. Continues adviar, rescue inhaler and nebs. Continues singulair . Persistent issues. Continue f/u with pulmonary.       Hypercholesterolemia   Low cholesterol diet and exercise. Follow lipid panel.  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 Evaluated 09/14/23.  Stable.  Recommended check SPEP and light chain ratio - 6 months - observation. Recent persistent elevated alkaline phos. GGT normal. Bone scan as outlined. No evidence of metastatic bone disease.       Seizure disorder [facility])   Being followed by neurology.  No recent seizures.        Return if symptoms worsen or fail to improve.   I discussed the assessment and treatment plan with the patient. The patient was provided an opportunity to ask questions and all were answered. The patient agreed with the plan and demonstrated an understanding of the instructions.   The patient was advised to call back or seek an in-person evaluation if the symptoms worsen or if the condition fails to improve as anticipated.    Dellar Fenton, MD

## 2024-01-14 ENCOUNTER — Telehealth (INDEPENDENT_AMBULATORY_CARE_PROVIDER_SITE_OTHER): Admitting: Internal Medicine

## 2024-01-14 DIAGNOSIS — E7800 Pure hypercholesterolemia, unspecified: Secondary | ICD-10-CM

## 2024-01-14 DIAGNOSIS — R059 Cough, unspecified: Secondary | ICD-10-CM | POA: Diagnosis not present

## 2024-01-14 DIAGNOSIS — G40909 Epilepsy, unspecified, not intractable, without status epilepticus: Secondary | ICD-10-CM

## 2024-01-14 DIAGNOSIS — J4540 Moderate persistent asthma, uncomplicated: Secondary | ICD-10-CM

## 2024-01-14 DIAGNOSIS — D472 Monoclonal gammopathy: Secondary | ICD-10-CM

## 2024-01-15 ENCOUNTER — Ambulatory Visit (INDEPENDENT_AMBULATORY_CARE_PROVIDER_SITE_OTHER): Admitting: Gastroenterology

## 2024-01-15 ENCOUNTER — Encounter: Payer: Self-pay | Admitting: Gastroenterology

## 2024-01-15 VITALS — BP 152/97 | HR 99 | Temp 97.8°F | Ht 66.0 in | Wt 197.5 lb

## 2024-01-15 DIAGNOSIS — K581 Irritable bowel syndrome with constipation: Secondary | ICD-10-CM | POA: Diagnosis not present

## 2024-01-15 DIAGNOSIS — K9049 Malabsorption due to intolerance, not elsewhere classified: Secondary | ICD-10-CM

## 2024-01-15 MED ORDER — LINACLOTIDE 290 MCG PO CAPS: 290.0000 ug | ORAL_CAPSULE | Freq: Every day | ORAL | 1 refills | Status: AC

## 2024-01-15 NOTE — Progress Notes (Unsigned)
 Karma Oz, MD [address]  [address]  [place], [place] [address]  Main: [phone]  Fax: [phone]    Gastroenterology Consultation  Referring Provider:     Dellar Fenton, MD Primary Care Physician:  Dellar Fenton, MD Primary Gastroenterologist:  Dr. Ellis Guys Reason for Consultation: IBS diarrhea        HPI:   Heather [name] is a 48 y.o. female referred by Dr. Dellar Fenton, MD  for consultation & management of chronic constipation, epigastric pain and nausea.  Patient has history of chronic constipation which is severe, has bowel movement about once a week associated with significant straining, describes it as impacted stool followed by excruciating abdominal pain, disimpaction followed by overflow diarrhea.  Patient also reports lower abdominal cramps in addition to these painful episodes associated with severe constipation.  She also has epigastric pain associated with nausea.  She underwent upper endoscopy which was unremarkable except for mild erosive esophagitis.  Apparently, patient is taking over-the-counter omeprazole  20 mg twice daily which helps with reflux.  Ultrasound abdomen did not reveal cholelithiasis.  Colonoscopy did not reveal any space-occupying lesions or inflammation.  It was a fair prep  Follow-up visit 06/29/2022 Patient reports that since last visit, her symptoms are predominantly diarrheal episodes for about a week followed by 2 to 3 days of not having a BM.  She stopped taking Linzess  because it was too strong for her, resulting in severe diarrhea and leakage and she has been predominantly having diarrheal episodes rather than severe constipation like before she had COVID.  She thinks her GI symptoms are slowly reverting back to pre-COVID time when she was having diarrheal episodes.  She has abdominal bloating, abdominal cramps.  She denies any particular stress in her life.  She has GI symptoms ever since her high school/college that she  could recollect.  NSAIDs: None  Antiplts/Anticoagulants/Anti thrombotics: None  GI Procedures:  EGD and colonoscopy 06/03/2021 - LA Grade A reflux esophagitis with no bleeding. - White nummular lesions in esophageal mucosa. Brushings performed. - Small hiatal hernia. - A medium amount of food (residue) in the stomach. - Mucosal changes in the duodenum. Biopsied. - Biopsies were obtained in the gastric body, at the incisura and in the gastric antrum.  - Preparation of the colon was poor. - One 4 mm polyp in the rectum, removed with a cold biopsy forceps. Resected and retrieved. - The examination was otherwise normal. - The rectum, sigmoid colon, descending colon, transverse colon, ascending colon and cecum are normal. - Non-bleeding internal hemorrhoids. - Anal papilla(e) were hypertrophied.  DIAGNOSIS:  A. DUODENUM; COLD BIOPSY:  - ENTERIC MUCOSA WITH PRESERVED VILLOUS ARCHITECTURE AND NO SIGNIFICANT  HISTOPATHOLOGIC CHANGE.  - NEGATIVE FOR FEATURES OF CELIAC, DYSPLASIA, AND MALIGNANCY.   B. STOMACH; COLD BIOPSY:  - GASTRIC ANTRAL AND OXYNTIC MUCOSA WITH NO SIGNIFICANT HISTOPATHOLOGIC  CHANGE.  - NEGATIVE FOR H. PYLORI, DYSPLASIA, AND MALIGNANCY.   C. RECTAL POLYP; COLD BIOPSY:  - HYPERPLASTIC POLYP.  - NEGATIVE FOR DYSPLASIA AND MALIGNANCY.   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 ENDOSCOPY;  Service: Endoscopy;  Laterality: N/A;   TUBAL  LIGATION Bilateral 09/25/2002   tubal reversal      Current Outpatient Medications:    Acetylcysteine 600 MG TBCR, Take by mouth., Disp: , Rfl:    AJOVY 225 MG/1.5ML SOAJ, Inject into the skin., Disp: , Rfl:    albuterol  (VENTOLIN  HFA) 108 (90 Base) MCG/ACT inhaler, INHALE 1 TO 2 PUFFS BY MOUTH EVERY 4 HOURS AS NEEDED FOR WHEEZING FOR SHORTNESS OF BREATH, Disp: 18 g, Rfl: 1   Albuterol -Budesonide  (AIRSUPRA ) 90-80 MCG/ACT AERO, Inhale 2 puffs into the lungs every 6 (six) hours., Disp: 1 g, Rfl: 3   amphetamine-dextroamphetamine (ADDERALL XR) 20 MG 24 hr capsule, Take 20 mg by mouth every morning., Disp: , Rfl:    amphetamine-dextroamphetamine (ADDERALL) 20 MG tablet, Take 20 mg by mouth daily., Disp: , Rfl:    ascorbic acid (VITAMIN C) 1000 MG tablet, Take by mouth., Disp: , Rfl:    benzonatate  (TESSALON ) 100 MG capsule, Take 1 capsule (100 mg total) by mouth every 8 (eight) hours., Disp: 21 capsule, Rfl: 0   cetirizine  (ZYRTEC ) 10 MG tablet, Take 1 tablet by mouth once daily, Disp: 90 tablet, Rfl: 3   cyanocobalamin  (VITAMIN B12) 1000 MCG/ML injection, Inject into the muscle., Disp: , Rfl:    diazepam (VALIUM) 10 MG tablet, Take 1 tablet by mouth 4 (four) times daily as needed., Disp: , Rfl:    divalproex (DEPAKOTE) 500 MG DR tablet, Take by mouth., Disp: , Rfl:    Dupilumab (DUPIXENT) 300 MG/2ML SOAJ, Inject 300 mg into the skin every 14 (fourteen) days. **loading dose completed in clinic on 01/04/2024**, Disp: 12 mL, Rfl: 1   fluticasone  (FLONASE ) 50 MCG/ACT nasal spray, Place 1 spray into both nostrils daily., Disp: 48 g, Rfl: 1   fluticasone -salmeterol (ADVAIR) 500-50 MCG/ACT AEPB, Inhale 1 puff into the lungs in the morning and at bedtime., Disp: 60 each, Rfl: 11   Galcanezumab-gnlm 120 MG/ML SOAJ, Inject into the skin., Disp: , Rfl:    guaiFENesin  (MUCINEX ) 600 MG 12 hr tablet, Take 2 tablets (1,200 mg total) by mouth 2 (two) times daily as needed., Disp: 60 tablet, Rfl: 1    guaiFENesin -codeine  100-10 MG/5ML syrup, Take 5 mLs by mouth every 6 (six) hours as needed for cough., Disp: 120 mL, Rfl: 0   ipratropium (ATROVENT ) 0.03 % nasal spray, Place 2 sprays into both nostrils every 12 (twelve) hours., Disp: 30 mL, Rfl: 12   ipratropium-albuterol  (DUONEB) 0.5-2.5 (3) MG/3ML SOLN, Inhale 3 mLs into the lungs every 4 (four) hours as needed., Disp: 360 mL, Rfl: 11   loratadine (CLARITIN) 10 MG tablet, Take 10 mg by mouth daily., Disp: , Rfl:    magnesium  oxide (MAG-OX) 400 MG tablet, Take 1 tablet (400 mg total) by mouth daily., Disp: 30 tablet, Rfl: 2   montelukast  (SINGULAIR ) 10 MG tablet, Take 1 tablet (10 mg total) by mouth at bedtime., Disp: 30 tablet, Rfl: 11   omeprazole  (PRILOSEC) 40 MG capsule, Take 1 capsule (40 mg total) by mouth daily before breakfast., Disp: 30 capsule, Rfl: 3   ondansetron  (ZOFRAN -ODT) 4 MG disintegrating tablet, Take 1 tablet (4 mg total) by mouth 2 (two) times daily as needed for nausea or vomiting., Disp: 20 tablet, Rfl: 0   potassium chloride  SA (  KLOR-CON  M) 20 MEQ tablet, Take 1 tablet (20 mEq total) by mouth 2 (two) times daily., Disp: 14 tablet, Rfl: 0   Rimegepant Sulfate  (NURTEC) 75 MG TBDP, Take by mouth., Disp: , Rfl:    UBRELVY 100 MG TABS, Take by mouth., Disp: , Rfl:     Family History  Problem Relation Age of Onset   Hypertension Mother    Diabetes Mother    Arthritis Mother    Heart disease Mother    Breast cancer Neg Hx      Social History   Tobacco Use   Smoking status: Former    Current packs/day: 0.00    Average packs/day: 0.5 packs/day for 5.0 years (2.5 ttl pk-yrs)    Types: Cigarettes    Start date: 2008    Quit date: 2013    Years since quitting: 12.3   Smokeless tobacco: Never   Tobacco comments:    Quit mid October 2014  Vaping Use   Vaping status: Never Used  Substance Use Topics   Alcohol use: No    Alcohol/week: 0.0 standard drinks of alcohol   Drug use: No    Allergies as of 01/15/2024 -  Review Complete 01/15/2024  Allergen Reaction Noted   Levaquin  [levofloxacin ] Swelling 04/30/2020   Tramadol Other (See Comments) 02/20/2013   Amoxicillin Nausea And Vomiting 01/20/2019   Augmentin [amoxicillin-pot clavulanate] Other (See Comments) 02/20/2013   Erythromycin  Nausea And Vomiting 06/25/2015   Lithium Nausea Only 02/20/2013    Review of Systems:    All systems reviewed and negative except where noted in HPI.   Physical Exam:  BP (!) 152/97 (BP Location: Left Arm, Patient Position: Sitting, Cuff Size: Normal)   Pulse 99   Temp 97.8 F (36.6 C) (Oral)   Ht 5\' 6"  (1.676 m)   Wt 197 lb 8 oz (89.6 kg)   LMP 12/13/2023 (Approximate)   BMI 31.88 kg/m  Patient's last menstrual period was 12/13/2023 (approximate).  General:   Alert,  Well-developed, well-nourished, pleasant and cooperative in NAD Head:  Normocephalic and atraumatic. Eyes:  Sclera clear, no icterus.   Conjunctiva pink. Ears:  Normal auditory acuity. Nose:  No deformity, discharge, or lesions. Mouth:  No deformity or lesions,oropharynx pink & moist. Neck:  Supple; no masses or thyromegaly. Lungs:  Respirations even and unlabored.  Clear throughout to auscultation.   No wheezes, crackles, or rhonchi. No acute distress. Heart:  Regular rate and rhythm; no murmurs, clicks, rubs, or gallops. Abdomen:  Normal bowel sounds. Soft, non-tender and moderately distended tympanic to percussion without masses, hepatosplenomegaly or hernias noted.  No guarding or rebound tenderness.   Rectal: Not performed Msk:  Symmetrical without gross deformities. Good, equal movement & strength bilaterally. Pulses:  Normal pulses noted. Extremities:  No clubbing or edema.  No cyanosis. Neurologic:  Alert and oriented x3;  grossly normal neurologically. Skin:  Intact without significant lesions or rashes. No jaundice. Psych:  Alert and cooperative. Normal mood and affect.  Imaging Studies: Reviewed  Assessment and Plan:   Heather [name] is a 48 y.o. female with history of chronic GI symptoms including nonbloody diarrhea associated with abdominal bloating, abdominal cramps.  Patient underwent upper endoscopy and colonoscopy, HIDA scan which were all unremarkable.  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

## 2024-01-15 NOTE — Patient Instructions (Signed)
 Do clear liquids one day all day and then at 4 to 5:00pm Mix 64 ounces of Gatorade with 238 grams of miralax. Drink 8oz every 20 to 30 minutes till solution is gone.  Gave Linzess  samples and sent a prescription to the pharmacy

## 2024-01-16 ENCOUNTER — Other Ambulatory Visit: Payer: Self-pay

## 2024-01-16 ENCOUNTER — Other Ambulatory Visit (HOSPITAL_COMMUNITY): Payer: Self-pay

## 2024-01-16 ENCOUNTER — Encounter: Payer: Self-pay | Admitting: Gastroenterology

## 2024-01-20 ENCOUNTER — Encounter: Payer: Self-pay | Admitting: Internal Medicine

## 2024-01-20 NOTE — Assessment & Plan Note (Signed)
 Low cholesterol diet and exercise.  Follow lipid panel.

## 2024-01-20 NOTE — Assessment & Plan Note (Signed)
 Seeing pulmonary. Continues adviar, rescue inhaler and nebs. Continues singulair . Persistent issues. Continue f/u with pulmonary.

## 2024-01-20 NOTE — Assessment & Plan Note (Signed)
 Seeing Dr Wilhelmenia Harada for f/u MGUS. Evaluated 09/14/23.  Stable.  Recommended check SPEP and light chain ratio - 6 months - observation. Recent persistent elevated alkaline phos. GGT normal. Bone scan as outlined. No evidence of metastatic bone disease.

## 2024-01-20 NOTE — Assessment & Plan Note (Signed)
 Being followed by neurology.  No recent seizures.

## 2024-01-20 NOTE — Assessment & Plan Note (Signed)
 Saw pulmonary 11/2023 - recommended to: C/w advair diskus 500 1 puff bid. Can use as needed during the day as well during flares.  Prescribed airsupra  as needed.  Dupixent.  C/w Singulair  10mg  At bedtime  Saline nasal rinses  Twice daily    Flonase  nasal 2 puffs BID and Ipratropium Nasal spray 1 spray each nostril bid. Albuterol  inhaler or Duoneb every 6hr as needed

## 2024-01-21 ENCOUNTER — Other Ambulatory Visit (HOSPITAL_COMMUNITY): Payer: Self-pay

## 2024-01-21 DIAGNOSIS — R002 Palpitations: Secondary | ICD-10-CM | POA: Diagnosis not present

## 2024-01-21 NOTE — Telephone Encounter (Addendum)
 Called [employer] to reverse claim for Dupixent. Appears that claim is going through as of today. Pharmacy reports a backorder with Dupixent pens. They have some in stock today. Tried to conference call patient into call but she did not pick up. Left VM and recommended she call pharmacy back ASAP and also that. MyChart message sent topatient as well  Phone: [phone]  Geraldene Kleine, PharmD, MPH, BCPS, CPP Clinical Pharmacist (Rheumatology and Pulmonology)

## 2024-01-30 ENCOUNTER — Other Ambulatory Visit: Payer: Self-pay | Admitting: Pulmonary Disease

## 2024-01-30 ENCOUNTER — Other Ambulatory Visit: Payer: Self-pay | Admitting: Internal Medicine

## 2024-01-30 DIAGNOSIS — J4550 Severe persistent asthma, uncomplicated: Secondary | ICD-10-CM

## 2024-01-30 NOTE — Telephone Encounter (Signed)
 LOV: 01/14/2024   NOV: 02/28/2024  Routing per refill protocol

## 2024-01-30 NOTE — Telephone Encounter (Signed)
 Received prescription request for zofran . This is not typically a continued rx. Please confirm doing ok and see if needs rx.

## 2024-01-31 ENCOUNTER — Other Ambulatory Visit: Payer: Self-pay | Admitting: Internal Medicine

## 2024-01-31 MED ORDER — ONDANSETRON 4 MG PO TBDP: 4.0000 mg | ORAL_TABLET | Freq: Two times a day (BID) | ORAL | 0 refills | Status: DC | PRN

## 2024-01-31 NOTE — Telephone Encounter (Signed)
 Rx ok'd for zofran . Discussed with pt. Given persistent nausea, needs f/u with GI. She will call to schedule an appt with Dr Baldomero Bone.

## 2024-02-04 ENCOUNTER — Telehealth: Payer: Self-pay

## 2024-02-04 NOTE — Telephone Encounter (Signed)
 Medical records request was sent from [employer] for records from 07/27/2023 to present  Request was sent to central medical records

## 2024-02-11 ENCOUNTER — Ambulatory Visit

## 2024-02-11 DIAGNOSIS — E538 Deficiency of other specified B group vitamins: Secondary | ICD-10-CM | POA: Diagnosis not present

## 2024-02-11 MED ORDER — CYANOCOBALAMIN 1000 MCG/ML IJ SOLN
1000.0000 ug | Freq: Once | INTRAMUSCULAR | Status: AC
  Administered 2024-02-11: 1000 ug via INTRAMUSCULAR

## 2024-02-11 NOTE — Progress Notes (Signed)
 Pt presented for their vitamin B12 injection. Pt was identified through two identifiers. Pt tolerated shot well in their right deltoid.

## 2024-02-12 ENCOUNTER — Other Ambulatory Visit: Payer: Self-pay | Admitting: Internal Medicine

## 2024-02-19 ENCOUNTER — Telehealth: Payer: Self-pay

## 2024-02-19 NOTE — Telephone Encounter (Signed)
 FYI- I called patient and advised her to be evaluated ASAP given her symptoms. Pt agreed. Per note below, Erby Hatcher from [facility] clinic is requesting a call from you. Callback number listed below.

## 2024-02-19 NOTE — Telephone Encounter (Signed)
 Late entry. Called Erby Hatcher and notified that my nurse had contacted Shalayah and informed her to be evaluated now. Informed her to to to ER for further evaluation and she agreed. Please confirm evaluated and doing ok.

## 2024-02-19 NOTE — Telephone Encounter (Signed)
 Copied from CRM [id number]. Topic: General - Other >> Feb 19, 2024  3:31 PM Aisha D wrote: Reason for CRM: Erby Hatcher with [facility] stated that she recently had a virtual visit with the pt. Erby Hatcher stated that the pt stated that she was experiencing 10/10 chest pains and it felt like the pain was moving up to her jaw. Erby Hatcher stated that the symptoms described sounds like a heart attack in women and advised the pt if it happens again to call 911. Erby Hatcher would like to speak to Dr.Scott regarding this concern today if possible. Call back number is [phone].

## 2024-02-20 ENCOUNTER — Encounter: Payer: Self-pay | Admitting: *Deleted

## 2024-02-20 ENCOUNTER — Inpatient Hospital Stay
Admission: EM | Admit: 2024-02-20 | Discharge: 2024-02-22 | DRG: 282 | Disposition: A | Discharge status: Other Acute Inpatient Hospital | Attending: Family Medicine | Admitting: Family Medicine

## 2024-02-20 ENCOUNTER — Emergency Department

## 2024-02-20 ENCOUNTER — Other Ambulatory Visit: Payer: Self-pay

## 2024-02-20 DIAGNOSIS — R0602 Shortness of breath: Secondary | ICD-10-CM | POA: Diagnosis not present

## 2024-02-20 DIAGNOSIS — Z5986 Financial insecurity: Secondary | ICD-10-CM

## 2024-02-20 DIAGNOSIS — I200 Unstable angina: Secondary | ICD-10-CM

## 2024-02-20 DIAGNOSIS — U099 Post covid-19 condition, unspecified: Secondary | ICD-10-CM | POA: Diagnosis present

## 2024-02-20 DIAGNOSIS — F319 Bipolar disorder, unspecified: Secondary | ICD-10-CM | POA: Diagnosis present

## 2024-02-20 DIAGNOSIS — D472 Monoclonal gammopathy: Secondary | ICD-10-CM | POA: Diagnosis present

## 2024-02-20 DIAGNOSIS — I2510 Atherosclerotic heart disease of native coronary artery without angina pectoris: Secondary | ICD-10-CM | POA: Diagnosis present

## 2024-02-20 DIAGNOSIS — Z7982 Long term (current) use of aspirin: Secondary | ICD-10-CM

## 2024-02-20 DIAGNOSIS — I214 Non-ST elevation (NSTEMI) myocardial infarction: Principal | ICD-10-CM | POA: Diagnosis present

## 2024-02-20 DIAGNOSIS — Z5941 Food insecurity: Secondary | ICD-10-CM

## 2024-02-20 DIAGNOSIS — Z833 Family history of diabetes mellitus: Secondary | ICD-10-CM

## 2024-02-20 DIAGNOSIS — D72829 Elevated white blood cell count, unspecified: Secondary | ICD-10-CM | POA: Diagnosis present

## 2024-02-20 DIAGNOSIS — F419 Anxiety disorder, unspecified: Secondary | ICD-10-CM | POA: Diagnosis present

## 2024-02-20 DIAGNOSIS — Z8249 Family history of ischemic heart disease and other diseases of the circulatory system: Secondary | ICD-10-CM

## 2024-02-20 DIAGNOSIS — Z1152 Encounter for screening for COVID-19: Secondary | ICD-10-CM

## 2024-02-20 DIAGNOSIS — Z79899 Other long term (current) drug therapy: Secondary | ICD-10-CM

## 2024-02-20 DIAGNOSIS — F909 Attention-deficit hyperactivity disorder, unspecified type: Secondary | ICD-10-CM | POA: Diagnosis present

## 2024-02-20 DIAGNOSIS — J45909 Unspecified asthma, uncomplicated: Secondary | ICD-10-CM | POA: Diagnosis present

## 2024-02-20 DIAGNOSIS — Z6829 Body mass index (BMI) 29.0-29.9, adult: Secondary | ICD-10-CM

## 2024-02-20 DIAGNOSIS — G40909 Epilepsy, unspecified, not intractable, without status epilepticus: Secondary | ICD-10-CM

## 2024-02-20 DIAGNOSIS — I493 Ventricular premature depolarization: Secondary | ICD-10-CM | POA: Diagnosis present

## 2024-02-20 DIAGNOSIS — E663 Overweight: Secondary | ICD-10-CM | POA: Diagnosis present

## 2024-02-20 DIAGNOSIS — G43909 Migraine, unspecified, not intractable, without status migrainosus: Secondary | ICD-10-CM | POA: Diagnosis present

## 2024-02-20 DIAGNOSIS — Z886 Allergy status to analgesic agent status: Secondary | ICD-10-CM

## 2024-02-20 DIAGNOSIS — K219 Gastro-esophageal reflux disease without esophagitis: Secondary | ICD-10-CM | POA: Diagnosis present

## 2024-02-20 DIAGNOSIS — E7800 Pure hypercholesterolemia, unspecified: Secondary | ICD-10-CM | POA: Diagnosis present

## 2024-02-20 DIAGNOSIS — Z87891 Personal history of nicotine dependence: Secondary | ICD-10-CM

## 2024-02-20 DIAGNOSIS — Z7951 Long term (current) use of inhaled steroids: Secondary | ICD-10-CM

## 2024-02-20 DIAGNOSIS — K589 Irritable bowel syndrome without diarrhea: Secondary | ICD-10-CM | POA: Diagnosis present

## 2024-02-20 DIAGNOSIS — Z881 Allergy status to other antibiotic agents status: Secondary | ICD-10-CM

## 2024-02-20 LAB — PROTIME-INR
INR: 1 (ref 0.8–1.2)
Prothrombin Time: 13.1 s (ref 11.4–15.2)

## 2024-02-20 LAB — BASIC METABOLIC PANEL WITH GFR
Anion gap: 10 (ref 5–15)
BUN: 9 mg/dL (ref 6–20)
CO2: 21 mmol/L — ABNORMAL LOW (ref 22–32)
Calcium: 9 mg/dL (ref 8.9–10.3)
Chloride: 106 mmol/L (ref 98–111)
Creatinine, Ser: 0.85 mg/dL (ref 0.44–1.00)
GFR, Estimated: >60 mL/min (ref >60)
Glucose, Bld: 98 mg/dL (ref 70–99)
Potassium: 4.1 mmol/L (ref 3.5–5.1)
Sodium: 137 mmol/L (ref 135–145)

## 2024-02-20 LAB — CBC
HCT: 38.6 % (ref 36.0–46.0)
Hemoglobin: 12.9 g/dL (ref 12.0–15.0)
MCH: 27.9 pg (ref 26.0–34.0)
MCHC: 33.4 g/dL (ref 30.0–36.0)
MCV: 83.5 fL (ref 80.0–100.0)
Platelets: 319 10*3/uL (ref 150–400)
RBC: 4.62 MIL/uL (ref 3.87–5.11)
RDW: 14.4 % (ref 11.5–15.5)
WBC: 13.4 10*3/uL — ABNORMAL HIGH (ref 4.0–10.5)
nRBC: 0 % (ref 0.0–0.2)

## 2024-02-20 LAB — POC URINE PREG, ED: Preg Test, Ur: NEGATIVE

## 2024-02-20 LAB — TROPONIN I (HIGH SENSITIVITY): Troponin I (High Sensitivity): 456 ng/L (ref <18)

## 2024-02-20 LAB — APTT: aPTT: 34 s (ref 24–36)

## 2024-02-20 MED ORDER — HEPARIN (PORCINE) 25000 UT/250ML-% IV SOLN
1150.0000 [IU]/h | INTRAVENOUS | Status: DC
  Administered 2024-02-21: 1000 [IU]/h via INTRAVENOUS
  Filled 2024-02-20: qty 250

## 2024-02-20 MED ORDER — ASPIRIN 81 MG PO CHEW
162.0000 mg | CHEWABLE_TABLET | Freq: Once | ORAL | Status: AC
  Administered 2024-02-20: 162 mg via ORAL

## 2024-02-20 MED ORDER — IOHEXOL 350 MG/ML SOLN
75.0000 mL | Freq: Once | INTRAVENOUS | Status: AC | PRN
  Administered 2024-02-20: 75 mL via INTRAVENOUS

## 2024-02-20 MED ORDER — ASPIRIN 81 MG PO CHEW
324.0000 mg | CHEWABLE_TABLET | Freq: Once | ORAL | Status: DC
  Filled 2024-02-20: qty 4

## 2024-02-20 MED ORDER — HEPARIN BOLUS VIA INFUSION
4000.0000 [IU] | Freq: Once | INTRAVENOUS | Status: AC
  Administered 2024-02-20: 4000 [IU] via INTRAVENOUS
  Filled 2024-02-20: qty 4000

## 2024-02-20 NOTE — ED Provider Notes (Incomplete)
 [facility] Provider Note    Event Date/Time   First MD Initiated Contact with Patient 02/20/24 2301     (approximate)   History   Shortness of Breath   HPI  Heather [name] is a 48 y.o. female   Past medical history of ***    Independent Historian contributed to assessment above: ***  External Medical Documents Reviewed: ***      Physical Exam   Triage Vital Signs: ED Triage Vitals  Encounter Vitals Group     BP 02/20/24 2136 (!) 125/90     Systolic BP Percentile --      Diastolic BP Percentile --      Pulse Rate 02/20/24 2136 93     Resp 02/20/24 2136 20     Temp 02/20/24 2136 97.8 F (36.6 C)     Temp Source 02/20/24 2136 Oral     SpO2 02/20/24 2136 99 %     Weight 02/20/24 2137 185 lb (83.9 kg)     Height 02/20/24 2137 5\' 6"  (1.676 m)     Head Circumference --      Peak Flow --      Pain Score 02/20/24 2137 9     Pain Loc --      Pain Education --      Exclude from Growth Chart --     Most recent vital signs: Vitals:   02/20/24 2136  BP: (!) 125/90  Pulse: 93  Resp: 20  Temp: 97.8 F (36.6 C)  SpO2: 99%    General: Awake, no distress. *** CV:  Good peripheral perfusion. *** Resp:  Normal effort. *** Abd:  No distention. *** Other:  ***   ED Results / Procedures / Treatments   Labs (all labs ordered are listed, but only abnormal results are displayed) Labs Reviewed  BASIC METABOLIC PANEL WITH GFR - Abnormal; Notable for the following components:      Result Value   CO2 21 (*)    All other components within normal limits  CBC - Abnormal; Notable for the following components:   WBC 13.4 (*)    All other components within normal limits  TROPONIN I (HIGH SENSITIVITY) - Abnormal; Notable for the following components:   Troponin I (High Sensitivity) 456 (*)    All other components within normal limits  BRAIN NATRIURETIC PEPTIDE  POC URINE PREG, ED     I ordered and reviewed the above labs they are notable  for ***  EKG  ED ECG REPORT I, Buell Carmin, the attending physician, personally viewed and interpreted this ECG.   Date: 02/20/2024  EKG Time: ***  Rate: ***  Rhythm: {ekg findings:315101}  Axis: ***  Intervals:{conduction defects:17367}  ST&T Change: ***    RADIOLOGY I independently reviewed and interpreted *** I also reviewed radiologist's formal read.   PROCEDURES:  Critical Care performed: {CriticalCareYesNo:19197::"Yes, see critical care procedure note(s)","No"}  Procedures   MEDICATIONS ORDERED IN ED: Medications  aspirin chewable tablet 324 mg (has no administration in time range)    External physician / consultants:  I spoke with *** regarding care plan for this patient.   IMPRESSION / MDM / ASSESSMENT AND PLAN / ED COURSE  I reviewed the triage vital signs and the nursing notes.                                Patient's presentation is most consistent with {  EM DGUY:40347}  Differential diagnosis includes, but is not limited to, ***   ***The patient is on the cardiac monitor to evaluate for evidence of arrhythmia and/or significant heart rate changes.  MDM:  ***  I considered hospitalization for admission or observation ***        FINAL CLINICAL IMPRESSION(S) / ED DIAGNOSES   Final diagnoses:  None     Rx / DC Orders   ED Discharge Orders     None        Note:  This document was prepared using Dragon voice recognition software and may include unintentional dictation errors.

## 2024-02-20 NOTE — ED Provider Notes (Signed)
 [facility] Provider Note    Event Date/Time   First MD Initiated Contact with Patient 02/20/24 2301     (approximate)   History   Shortness of Breath   HPI  Heather [name] is a 48 y.o. female   Past medical history of asthma and "long-hauler COVID" who presents emergency department with 4 days of on and off chest pain and shortness of breath.  Worse with exertion.  No respiratory infectious symptoms.  Thought it might be her asthma but has not very much improved with her inhaler treatment.  She denies abdominal discomfort, nausea vomiting or diarrhea.  No GU symptoms.  No significant family history of cardiac disease.  No personal history of cardiac disease.  Independent Historian contributed to assessment above: Her mother is at bedside to corroborate information above      Physical Exam   Triage Vital Signs: ED Triage Vitals  Encounter Vitals Group     BP 02/20/24 2136 (!) 125/90     Systolic BP Percentile --      Diastolic BP Percentile --      Pulse Rate 02/20/24 2136 93     Resp 02/20/24 2136 20     Temp 02/20/24 2136 97.8 F (36.6 C)     Temp Source 02/20/24 2136 Oral     SpO2 02/20/24 2136 99 %     Weight 02/20/24 2137 185 lb (83.9 kg)     Height 02/20/24 2137 5\' 6"  (1.676 m)     Head Circumference --      Peak Flow --      Pain Score 02/20/24 2137 9     Pain Loc --      Pain Education --      Exclude from Growth Chart --     Most recent vital signs: Vitals:   02/20/24 2136  BP: (!) 125/90  Pulse: 93  Resp: 20  Temp: 97.8 F (36.6 C)  SpO2: 99%    General: Awake, no distress.  CV:  Good peripheral perfusion.  Resp:  Normal effort.  Abd:  No distention.  Other:  Awake alert comfortable appearing with normal vital signs afebrile and no hypoxemia breathing comfortably.  Very scant wheeze in apices, no focality & normal rate and rhythm of the heart sounds, soft nontender abdomen to the palpation all quadrants.  Skin  appears warm well-perfused no unilateral leg swelling noted.  Radial pulses intact and equal bilaterally.   ED Results / Procedures / Treatments   Labs (all labs ordered are listed, but only abnormal results are displayed) Labs Reviewed  BASIC METABOLIC PANEL WITH GFR - Abnormal; Notable for the following components:      Result Value   CO2 21 (*)    All other components within normal limits  CBC - Abnormal; Notable for the following components:   WBC 13.4 (*)    All other components within normal limits  BRAIN NATRIURETIC PEPTIDE - Abnormal; Notable for the following components:   B Natriuretic Peptide 123.0 (*)    All other components within normal limits  TROPONIN I (HIGH SENSITIVITY) - Abnormal; Notable for the following components:   Troponin I (High Sensitivity) 456 (*)    All other components within normal limits  TROPONIN I (HIGH SENSITIVITY) - Abnormal; Notable for the following components:   Troponin I (High Sensitivity) 480 (*)    All other components within normal limits  RESP PANEL BY RT-PCR (RSV, FLU A&B, COVID)  RVPGX2  APTT  PROTIME-INR  POC URINE PREG, ED     I ordered and reviewed the above labs they are notable for initial troponin is high at 450.  EKG  ED ECG REPORT I, Buell Carmin, the attending physician, personally viewed and interpreted this ECG.   Date: 02/20/2024  EKG Time: 2138  Rate: 92  Rhythm: sinus  Axis: nl  Intervals:nl  ST&T Change: no stemi    RADIOLOGY I independently reviewed and interpreted chest x-ray and I see no obvious focality or pneumothorax I also reviewed radiologist's formal read.   PROCEDURES:  Critical Care performed: Yes, see critical care procedure note(s)  .Critical Care  Performed by: Buell Carmin, MD Authorized by: Buell Carmin, MD   Critical care provider statement:    Critical care time (minutes):  30   Critical care was time spent personally by me on the following activities:  Development of treatment  plan with patient or surrogate, discussions with consultants, evaluation of patient's response to treatment, examination of patient, ordering and review of laboratory studies, ordering and review of radiographic studies, ordering and performing treatments and interventions, pulse oximetry, re-evaluation of patient's condition and review of old charts    MEDICATIONS ORDERED IN ED: Medications  heparin ADULT infusion 100 units/mL (25000 units/250mL) (1,000 Units/hr Intravenous New Bag/Given 02/21/24 0000)  ipratropium-albuterol  (DUONEB) 0.5-2.5 (3) MG/3ML nebulizer solution 3 mL (has no administration in time range)  aspirin chewable tablet 162 mg (162 mg Oral Given 02/20/24 2327)  heparin bolus via infusion 4,000 Units (4,000 Units Intravenous Bolus from Bag 02/20/24 2355)  iohexol (OMNIPAQUE) 350 MG/ML injection 75 mL (75 mLs Intravenous Contrast Given 02/20/24 2345)    External physician / consultants:  I spoke with hospital medicine for admission and regarding care plan for this patient.   IMPRESSION / MDM / ASSESSMENT AND PLAN / ED COURSE  I reviewed the triage vital signs and the nursing notes.                                Patient's presentation is most consistent with acute presentation with potential threat to life or bodily function.  Differential diagnosis includes, but is not limited to, ACS, PE, dissection, asthma exacerbation, respiratory infection   The patient is on the cardiac monitor to evaluate for evidence of arrhythmia and/or significant heart rate changes.  MDM:    Her symptoms are consistent with unstable angina she has had stuttering on and off chest pain/shortness of breath worse with exertion over the last several days.  Indeed her troponin is elevated at 450 but no STEMI noted on EKG.  Started on heparin.  Given aspirin she had already taken 2 at home so will top her off with another 2 here.   I wonder about blood clot as well.  Her troponin leak and symptoms  may match for right heart strain in the setting of blood clot so I got a PE scan which was fortunately negative.  Hemodynamics remained stable, she is to be admitted on heparin for NSTEMI.       FINAL CLINICAL IMPRESSION(S) / ED DIAGNOSES   Final diagnoses:  NSTEMI (non-ST elevated myocardial infarction) (HCC)  Unstable angina (HCC)     Rx / DC Orders   ED Discharge Orders     None        Note:  This document was prepared using Dragon voice recognition software and may include unintentional dictation errors.  Buell Carmin, MD 02/21/24 [phone]

## 2024-02-20 NOTE — ED Triage Notes (Signed)
 Pt to triage via wheelchair.  Pt reports sob and chest pain intermittently for approx 1 month.  Sx worse today.  Hx acid reflux.  Pt has a cough.  Nonsmoker.  Pt alert.

## 2024-02-20 NOTE — Telephone Encounter (Signed)
 LM for patient

## 2024-02-20 NOTE — Progress Notes (Signed)
 ANTICOAGULATION CONSULT NOTE  Pharmacy Consult for heparin infusion Indication: ACS/STEMI  Allergies  Allergen Reactions   Levaquin  [Levofloxacin ] Swelling   Tramadol Other (See Comments)    Seizures   Amoxicillin Nausea And Vomiting   Augmentin [Amoxicillin-Pot Clavulanate] Other (See Comments)   Erythromycin  Nausea And Vomiting   Lithium Nausea Only    Patient Measurements: Height: 5\' 6"  (167.6 cm) Weight: 83.9 kg (185 lb) IBW/kg (Calculated) : 59.3 HEPARIN DW (KG): 77.1  Vital Signs: Temp: 97.8 F (36.6 C) (05/28 2136) Temp Source: Oral (05/28 2136) BP: 125/90 (05/28 2136) Pulse Rate: 93 (05/28 2136)  Labs: Recent Labs    02/20/24 2139  HGB 12.9  HCT 38.6  PLT 319  CREATININE 0.85  TROPONINIHS 456*    Estimated Creatinine Clearance: 88.3 mL/min (by C-G formula based on SCr of 0.85 mg/dL).   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  Goal of Therapy:  Heparin level 0.3-0.7 units/ml Monitor platelets by anticoagulation protocol: Yes   Plan:  Bolus 4000 units x 1 Start heparin infusion at 1000 units/hr Will check HL in 6 hr after start of infusion CBC daily while on heparin  Jemuel Laursen S. Rufina Kimery, PharmD, [facility] 02/20/2024 11:22 PM

## 2024-02-21 ENCOUNTER — Encounter
Admission: EM | Disposition: A | Discharge status: Other Acute Inpatient Hospital | Payer: Self-pay | Source: Home / Self Care | Attending: Family Medicine

## 2024-02-21 ENCOUNTER — Inpatient Hospital Stay
Admit: 2024-02-21 | Discharge: 2024-02-21 | Disposition: A | Discharge status: Home / Self Care | Attending: Internal Medicine | Admitting: Internal Medicine

## 2024-02-21 DIAGNOSIS — G43909 Migraine, unspecified, not intractable, without status migrainosus: Secondary | ICD-10-CM | POA: Diagnosis present

## 2024-02-21 DIAGNOSIS — K219 Gastro-esophageal reflux disease without esophagitis: Secondary | ICD-10-CM | POA: Diagnosis present

## 2024-02-21 DIAGNOSIS — Z5986 Financial insecurity: Secondary | ICD-10-CM | POA: Diagnosis not present

## 2024-02-21 DIAGNOSIS — Z833 Family history of diabetes mellitus: Secondary | ICD-10-CM | POA: Diagnosis not present

## 2024-02-21 DIAGNOSIS — F419 Anxiety disorder, unspecified: Secondary | ICD-10-CM

## 2024-02-21 DIAGNOSIS — Z79899 Other long term (current) drug therapy: Secondary | ICD-10-CM

## 2024-02-21 DIAGNOSIS — Z7982 Long term (current) use of aspirin: Secondary | ICD-10-CM | POA: Diagnosis not present

## 2024-02-21 DIAGNOSIS — Z6829 Body mass index (BMI) 29.0-29.9, adult: Secondary | ICD-10-CM

## 2024-02-21 DIAGNOSIS — I493 Ventricular premature depolarization: Secondary | ICD-10-CM | POA: Diagnosis present

## 2024-02-21 DIAGNOSIS — I2510 Atherosclerotic heart disease of native coronary artery without angina pectoris: Secondary | ICD-10-CM

## 2024-02-21 DIAGNOSIS — R0602 Shortness of breath: Secondary | ICD-10-CM | POA: Diagnosis present

## 2024-02-21 DIAGNOSIS — I214 Non-ST elevation (NSTEMI) myocardial infarction: Principal | ICD-10-CM

## 2024-02-21 DIAGNOSIS — U099 Post covid-19 condition, unspecified: Secondary | ICD-10-CM | POA: Diagnosis present

## 2024-02-21 DIAGNOSIS — D472 Monoclonal gammopathy: Secondary | ICD-10-CM

## 2024-02-21 DIAGNOSIS — Z87891 Personal history of nicotine dependence: Secondary | ICD-10-CM | POA: Diagnosis not present

## 2024-02-21 DIAGNOSIS — F909 Attention-deficit hyperactivity disorder, unspecified type: Secondary | ICD-10-CM

## 2024-02-21 DIAGNOSIS — J4540 Moderate persistent asthma, uncomplicated: Secondary | ICD-10-CM

## 2024-02-21 DIAGNOSIS — Z1152 Encounter for screening for COVID-19: Secondary | ICD-10-CM | POA: Diagnosis not present

## 2024-02-21 DIAGNOSIS — Z7951 Long term (current) use of inhaled steroids: Secondary | ICD-10-CM

## 2024-02-21 DIAGNOSIS — K589 Irritable bowel syndrome without diarrhea: Secondary | ICD-10-CM | POA: Diagnosis present

## 2024-02-21 DIAGNOSIS — Z8249 Family history of ischemic heart disease and other diseases of the circulatory system: Secondary | ICD-10-CM

## 2024-02-21 DIAGNOSIS — R079 Chest pain, unspecified: Secondary | ICD-10-CM

## 2024-02-21 DIAGNOSIS — E663 Overweight: Secondary | ICD-10-CM

## 2024-02-21 DIAGNOSIS — E7800 Pure hypercholesterolemia, unspecified: Secondary | ICD-10-CM | POA: Diagnosis present

## 2024-02-21 DIAGNOSIS — Z5941 Food insecurity: Secondary | ICD-10-CM

## 2024-02-21 DIAGNOSIS — D72829 Elevated white blood cell count, unspecified: Secondary | ICD-10-CM

## 2024-02-21 DIAGNOSIS — G40909 Epilepsy, unspecified, not intractable, without status epilepticus: Secondary | ICD-10-CM

## 2024-02-21 DIAGNOSIS — Z743 Need for continuous supervision: Secondary | ICD-10-CM | POA: Diagnosis not present

## 2024-02-21 DIAGNOSIS — Z881 Allergy status to other antibiotic agents status: Secondary | ICD-10-CM

## 2024-02-21 DIAGNOSIS — F319 Bipolar disorder, unspecified: Secondary | ICD-10-CM | POA: Diagnosis present

## 2024-02-21 DIAGNOSIS — J45909 Unspecified asthma, uncomplicated: Secondary | ICD-10-CM | POA: Diagnosis present

## 2024-02-21 DIAGNOSIS — I200 Unstable angina: Secondary | ICD-10-CM | POA: Diagnosis present

## 2024-02-21 DIAGNOSIS — I10 Essential (primary) hypertension: Secondary | ICD-10-CM | POA: Diagnosis not present

## 2024-02-21 DIAGNOSIS — Z886 Allergy status to analgesic agent status: Secondary | ICD-10-CM

## 2024-02-21 HISTORY — PX: LEFT HEART CATH AND CORONARY ANGIOGRAPHY: CATH118249

## 2024-02-21 LAB — TROPONIN I (HIGH SENSITIVITY)
Troponin I (High Sensitivity): 480 ng/L
Troponin I (High Sensitivity): 565 ng/L (ref <18)
Troponin I (High Sensitivity): 692 ng/L (ref <18)
Troponin I (High Sensitivity): 693 ng/L (ref <18)
Troponin I (High Sensitivity): 460 ng/L (ref <18)

## 2024-02-21 LAB — CBC
HCT: 36.5 % (ref 36.0–46.0)
Hemoglobin: 12.1 g/dL (ref 12.0–15.0)
MCH: 27.8 pg (ref 26.0–34.0)
MCHC: 33.2 g/dL (ref 30.0–36.0)
MCV: 83.9 fL (ref 80.0–100.0)
Platelets: 301 10*3/uL (ref 150–400)
RBC: 4.35 MIL/uL (ref 3.87–5.11)
RDW: 14.3 % (ref 11.5–15.5)
WBC: 13.2 10*3/uL — ABNORMAL HIGH (ref 4.0–10.5)
nRBC: 0 % (ref 0.0–0.2)

## 2024-02-21 LAB — LIPID PANEL
Cholesterol: 187 mg/dL (ref 0–200)
HDL: 35 mg/dL — ABNORMAL LOW (ref >40)
LDL Cholesterol: 106 mg/dL — ABNORMAL HIGH (ref 0–99)
Total CHOL/HDL Ratio: 5.3 ratio
Triglycerides: 230 mg/dL — ABNORMAL HIGH (ref <150)
VLDL: 46 mg/dL — ABNORMAL HIGH (ref 0–40)

## 2024-02-21 LAB — ECHOCARDIOGRAM COMPLETE
AR max vel: 2.56 cm2
AV Area VTI: 2.79 cm2
AV Area mean vel: 2.53 cm2
AV Mean grad: 2 mmHg
AV Peak grad: 3.2 mmHg
Ao pk vel: 0.9 m/s
Area-P 1/2: 5.27 cm2
Calc EF: 37.9 %
Height: 66 in
S' Lateral: 3.6 cm
Single Plane A2C EF: 36.7 %
Single Plane A4C EF: 39.7 %
Weight: 2948.87 [oz_av]

## 2024-02-21 LAB — BASIC METABOLIC PANEL WITH GFR
Anion gap: 7 (ref 5–15)
BUN: 10 mg/dL (ref 6–20)
CO2: 25 mmol/L (ref 22–32)
Calcium: 8.8 mg/dL — ABNORMAL LOW (ref 8.9–10.3)
Chloride: 106 mmol/L (ref 98–111)
Creatinine, Ser: 0.82 mg/dL (ref 0.44–1.00)
GFR, Estimated: >60 mL/min (ref >60)
Glucose, Bld: 90 mg/dL (ref 70–99)
Potassium: 3.6 mmol/L (ref 3.5–5.1)
Sodium: 138 mmol/L (ref 135–145)

## 2024-02-21 LAB — HEPARIN LEVEL (UNFRACTIONATED)
Heparin Unfractionated: 0.22 [IU]/mL — ABNORMAL LOW (ref 0.30–0.70)
Heparin Unfractionated: 0.34 [IU]/mL (ref 0.30–0.70)

## 2024-02-21 LAB — HEMOGLOBIN A1C
Hgb A1c MFr Bld: 4.7 % — ABNORMAL LOW (ref 4.8–5.6)
Mean Plasma Glucose: 88.19 mg/dL

## 2024-02-21 LAB — RESP PANEL BY RT-PCR (RSV, FLU A&B, COVID)  RVPGX2
Influenza A by PCR: NEGATIVE
Influenza B by PCR: NEGATIVE
Resp Syncytial Virus by PCR: NEGATIVE
SARS Coronavirus 2 by RT PCR: NEGATIVE

## 2024-02-21 LAB — BRAIN NATRIURETIC PEPTIDE: B Natriuretic Peptide: 123 pg/mL — ABNORMAL HIGH (ref 0.0–100.0)

## 2024-02-21 LAB — HIV ANTIBODY (ROUTINE TESTING W REFLEX): HIV Screen 4th Generation wRfx: NONREACTIVE

## 2024-02-21 SURGERY — LEFT HEART CATH AND CORONARY ANGIOGRAPHY
Anesthesia: Moderate Sedation

## 2024-02-21 MED ORDER — FENTANYL CITRATE (PF) 100 MCG/2ML IJ SOLN
INTRAMUSCULAR | Status: DC | PRN
  Administered 2024-02-21 (×2): 25 ug via INTRAVENOUS

## 2024-02-21 MED ORDER — MIDAZOLAM HCL 2 MG/2ML IJ SOLN
INTRAMUSCULAR | Status: DC | PRN
Start: 2024-02-21 — End: 2024-02-21
  Administered 2024-02-21: 1 mg via INTRAVENOUS

## 2024-02-21 MED ORDER — DM-GUAIFENESIN ER 30-600 MG PO TB12: 1.0000 | ORAL_TABLET | Freq: Two times a day (BID) | ORAL | Status: DC | PRN

## 2024-02-21 MED ORDER — HEPARIN (PORCINE) 25000 UT/250ML-% IV SOLN
1450.0000 [IU]/h | INTRAVENOUS | Status: DC
  Administered 2024-02-21: 1150 [IU]/h via INTRAVENOUS
  Administered 2024-02-22: 1350 [IU]/h via INTRAVENOUS
  Filled 2024-02-21 (×2): qty 250

## 2024-02-21 MED ORDER — POLYETHYLENE GLYCOL 3350 17 G PO PACK: 17.0000 g | PACK | Freq: Two times a day (BID) | ORAL | Status: DC | PRN

## 2024-02-21 MED ORDER — LIDOCAINE HCL 1 % IJ SOLN
INTRAMUSCULAR | Status: AC
  Filled 2024-02-21: qty 20

## 2024-02-21 MED ORDER — NITROGLYCERIN 0.4 MG SL SUBL
0.4000 mg | SUBLINGUAL_TABLET | SUBLINGUAL | Status: DC | PRN
  Administered 2024-02-21: 0.4 mg via SUBLINGUAL
  Filled 2024-02-21: qty 1

## 2024-02-21 MED ORDER — FENTANYL CITRATE (PF) 100 MCG/2ML IJ SOLN
INTRAMUSCULAR | Status: AC
  Filled 2024-02-21: qty 2

## 2024-02-21 MED ORDER — SODIUM CHLORIDE 0.9% FLUSH
3.0000 mL | INTRAVENOUS | Status: DC | PRN
Start: 2024-02-21 — End: 2024-02-23

## 2024-02-21 MED ORDER — ONDANSETRON HCL 4 MG/2ML IJ SOLN: 4.0000 mg | Freq: Three times a day (TID) | INTRAMUSCULAR | Status: DC | PRN

## 2024-02-21 MED ORDER — SODIUM CHLORIDE 0.9% FLUSH
3.0000 mL | Freq: Two times a day (BID) | INTRAVENOUS | Status: DC
  Administered 2024-02-21 – 2024-02-22 (×2): 3 mL via INTRAVENOUS

## 2024-02-21 MED ORDER — MIDAZOLAM HCL 2 MG/2ML IJ SOLN
INTRAMUSCULAR | Status: AC
  Filled 2024-02-21: qty 2

## 2024-02-21 MED ORDER — ACETAMINOPHEN 325 MG PO TABS: 650.0000 mg | ORAL_TABLET | Freq: Four times a day (QID) | ORAL | Status: DC | PRN

## 2024-02-21 MED ORDER — ASPIRIN 325 MG PO TABS
325.0000 mg | ORAL_TABLET | Freq: Every day | ORAL | Status: DC
  Filled 2024-02-21: qty 1

## 2024-02-21 MED ORDER — DIVALPROEX SODIUM ER 500 MG PO TB24
1500.0000 mg | ORAL_TABLET | Freq: Every day | ORAL | Status: DC
  Administered 2024-02-21 – 2024-02-22 (×3): 1500 mg via ORAL
  Filled 2024-02-21: qty 6
  Filled 2024-02-21 (×2): qty 3

## 2024-02-21 MED ORDER — SODIUM CHLORIDE 0.9 % WEIGHT BASED INFUSION
1.0000 mL/kg/h | INTRAVENOUS | Status: DC
  Administered 2024-02-21: 1 mL/kg/h via INTRAVENOUS

## 2024-02-21 MED ORDER — HEPARIN (PORCINE) IN NACL 1000-0.9 UT/500ML-% IV SOLN
INTRAVENOUS | Status: AC
  Filled 2024-02-21: qty 1000

## 2024-02-21 MED ORDER — LORATADINE 10 MG PO TABS
10.0000 mg | ORAL_TABLET | Freq: Every day | ORAL | Status: DC
  Administered 2024-02-21 – 2024-02-22 (×2): 10 mg via ORAL
  Filled 2024-02-21 (×2): qty 1

## 2024-02-21 MED ORDER — RIMEGEPANT SULFATE 75 MG PO TBDP: 75.0000 mg | ORAL_TABLET | Freq: Every day | ORAL | Status: DC | PRN

## 2024-02-21 MED ORDER — SODIUM CHLORIDE 0.9 % WEIGHT BASED INFUSION: 3.0000 mL/kg/h | INTRAVENOUS | Status: DC

## 2024-02-21 MED ORDER — SODIUM CHLORIDE 0.9 % IV SOLN: INTRAVENOUS | Status: AC

## 2024-02-21 MED ORDER — VERAPAMIL HCL 2.5 MG/ML IV SOLN
INTRAVENOUS | Status: DC | PRN
  Administered 2024-02-21: 2.5 mg via INTRAVENOUS

## 2024-02-21 MED ORDER — SODIUM CHLORIDE 0.9 % IV SOLN
250.0000 mL | INTRAVENOUS | Status: AC | PRN
Start: 2024-02-21 — End: 2024-02-22

## 2024-02-21 MED ORDER — MONTELUKAST SODIUM 10 MG PO TABS
10.0000 mg | ORAL_TABLET | Freq: Every day | ORAL | Status: DC
  Administered 2024-02-21 – 2024-02-22 (×3): 10 mg via ORAL
  Filled 2024-02-21 (×3): qty 1

## 2024-02-21 MED ORDER — IPRATROPIUM-ALBUTEROL 0.5-2.5 (3) MG/3ML IN SOLN
3.0000 mL | Freq: Once | RESPIRATORY_TRACT | Status: DC
  Administered 2024-02-21: 3 mL via RESPIRATORY_TRACT
  Filled 2024-02-21: qty 3

## 2024-02-21 MED ORDER — LINACLOTIDE 290 MCG PO CAPS: 290.0000 ug | ORAL_CAPSULE | Freq: Every day | ORAL | Status: DC | PRN

## 2024-02-21 MED ORDER — SODIUM CHLORIDE 0.9 % WEIGHT BASED INFUSION: 1.0000 mL/kg/h | INTRAVENOUS | Status: DC

## 2024-02-21 MED ORDER — IPRATROPIUM-ALBUTEROL 0.5-2.5 (3) MG/3ML IN SOLN
3.0000 mL | RESPIRATORY_TRACT | Status: DC
  Administered 2024-02-21: 3 mL via RESPIRATORY_TRACT
  Filled 2024-02-21: qty 3

## 2024-02-21 MED ORDER — HEPARIN BOLUS VIA INFUSION
1200.0000 [IU] | Freq: Once | INTRAVENOUS | Status: AC
  Administered 2024-02-21: 1200 [IU] via INTRAVENOUS
  Filled 2024-02-21: qty 1200

## 2024-02-21 MED ORDER — SODIUM CHLORIDE 0.9 % WEIGHT BASED INFUSION
3.0000 mL/kg/h | INTRAVENOUS | Status: DC
  Administered 2024-02-21: 3 mL/kg/h via INTRAVENOUS

## 2024-02-21 MED ORDER — ASPIRIN 81 MG PO CHEW: 162.0000 mg | CHEWABLE_TABLET | Freq: Once | ORAL | Status: DC

## 2024-02-21 MED ORDER — OXYCODONE-ACETAMINOPHEN 5-325 MG PO TABS
1.0000 | ORAL_TABLET | ORAL | Status: DC | PRN
  Administered 2024-02-21 (×2): 1 via ORAL
  Filled 2024-02-21 (×2): qty 1

## 2024-02-21 MED ORDER — ASPIRIN 81 MG PO TBEC
81.0000 mg | DELAYED_RELEASE_TABLET | Freq: Every day | ORAL | Status: DC
  Administered 2024-02-21 – 2024-02-22 (×2): 81 mg via ORAL
  Filled 2024-02-21 (×2): qty 1

## 2024-02-21 MED ORDER — IOHEXOL 300 MG/ML  SOLN
INTRAMUSCULAR | Status: DC | PRN
  Administered 2024-02-21: 40 mL

## 2024-02-21 MED ORDER — ATORVASTATIN CALCIUM 80 MG PO TABS
80.0000 mg | ORAL_TABLET | Freq: Every day | ORAL | Status: DC
  Administered 2024-02-21 – 2024-02-22 (×2): 80 mg via ORAL
  Filled 2024-02-21 (×2): qty 1

## 2024-02-21 MED ORDER — HEPARIN SODIUM (PORCINE) 1000 UNIT/ML IJ SOLN
INTRAMUSCULAR | Status: AC
Start: 2024-02-21 — End: ?
  Filled 2024-02-21: qty 10

## 2024-02-21 MED ORDER — DIAZEPAM 5 MG PO TABS: 10.0000 mg | ORAL_TABLET | Freq: Four times a day (QID) | ORAL | Status: DC | PRN

## 2024-02-21 MED ORDER — VERAPAMIL HCL 2.5 MG/ML IV SOLN
INTRAVENOUS | Status: AC
Start: 2024-02-21 — End: ?
  Filled 2024-02-21: qty 2

## 2024-02-21 MED ORDER — AMPHETAMINE-DEXTROAMPHET ER 5 MG PO CP24
20.0000 mg | ORAL_CAPSULE | Freq: Every day | ORAL | Status: DC
  Filled 2024-02-21: qty 4

## 2024-02-21 MED ORDER — ALBUTEROL SULFATE (2.5 MG/3ML) 0.083% IN NEBU: 2.5000 mg | INHALATION_SOLUTION | RESPIRATORY_TRACT | Status: DC | PRN

## 2024-02-21 MED ORDER — FLUTICASONE FUROATE-VILANTEROL 200-25 MCG/ACT IN AEPB
1.0000 | INHALATION_SPRAY | Freq: Every day | RESPIRATORY_TRACT | Status: DC
  Administered 2024-02-21 – 2024-02-22 (×2): 1 via RESPIRATORY_TRACT
  Filled 2024-02-21: qty 28

## 2024-02-21 MED ORDER — ALBUTEROL SULFATE HFA 108 (90 BASE) MCG/ACT IN AERS: 2.0000 | INHALATION_SPRAY | RESPIRATORY_TRACT | Status: DC | PRN

## 2024-02-21 MED ORDER — FLUTICASONE PROPIONATE 50 MCG/ACT NA SUSP
1.0000 | Freq: Every day | NASAL | Status: DC
  Administered 2024-02-21: 1 via NASAL
  Filled 2024-02-21: qty 16

## 2024-02-21 MED ORDER — HEPARIN (PORCINE) IN NACL 2000-0.9 UNIT/L-% IV SOLN
INTRAVENOUS | Status: DC | PRN
  Administered 2024-02-21: 1000 mL

## 2024-02-21 MED ORDER — HEPARIN SODIUM (PORCINE) 1000 UNIT/ML IJ SOLN
INTRAMUSCULAR | Status: DC | PRN
  Administered 2024-02-21: 4000 [IU] via INTRAVENOUS

## 2024-02-21 MED ORDER — IPRATROPIUM-ALBUTEROL 0.5-2.5 (3) MG/3ML IN SOLN
3.0000 mL | Freq: Three times a day (TID) | RESPIRATORY_TRACT | Status: DC
  Administered 2024-02-21 – 2024-02-22 (×2): 3 mL via RESPIRATORY_TRACT
  Filled 2024-02-21 (×3): qty 3

## 2024-02-21 MED ORDER — ASPIRIN 81 MG PO CHEW: 81.0000 mg | CHEWABLE_TABLET | ORAL | Status: DC

## 2024-02-21 MED ORDER — PANTOPRAZOLE SODIUM 40 MG PO TBEC
40.0000 mg | DELAYED_RELEASE_TABLET | Freq: Every day | ORAL | Status: DC
  Administered 2024-02-21 – 2024-02-22 (×2): 40 mg via ORAL
  Filled 2024-02-21 (×2): qty 1

## 2024-02-21 MED ORDER — FENTANYL CITRATE PF 50 MCG/ML IJ SOSY
25.0000 ug | PREFILLED_SYRINGE | INTRAMUSCULAR | Status: DC | PRN
  Administered 2024-02-21 – 2024-02-22 (×4): 25 ug via INTRAVENOUS
  Filled 2024-02-21 (×4): qty 1

## 2024-02-21 MED ORDER — LIDOCAINE HCL (PF) 1 % IJ SOLN
INTRAMUSCULAR | Status: DC | PRN
Start: 2024-02-21 — End: 2024-02-21
  Administered 2024-02-21: 2 mL

## 2024-02-21 MED ORDER — LORAZEPAM 2 MG/ML IJ SOLN: 2.0000 mg | INTRAMUSCULAR | Status: DC | PRN

## 2024-02-21 MED ORDER — HYDRALAZINE HCL 20 MG/ML IJ SOLN: 5.0000 mg | INTRAMUSCULAR | Status: DC | PRN

## 2024-02-21 MED ORDER — ORAL CARE MOUTH RINSE: 15.0000 mL | OROMUCOSAL | Status: DC | PRN

## 2024-02-21 SURGICAL SUPPLY — 9 items
CATH INFINITI AMBI 5FR JK (CATHETERS) IMPLANT
CATH INFINITI JR4 5F (CATHETERS) IMPLANT
DEVICE RAD TR BAND REGULAR (VASCULAR PRODUCTS) IMPLANT
DRAPE BRACHIAL (DRAPES) IMPLANT
GLIDESHEATH SLEND SS 6F .021 (SHEATH) IMPLANT
GUIDEWIRE INQWIRE 1.5J.035X260 (WIRE) IMPLANT
PACK CARDIAC CATH (CUSTOM PROCEDURE TRAY) ×1 IMPLANT
SET ATX-X65L (MISCELLANEOUS) IMPLANT
STATION PROTECTION PRESSURIZED (MISCELLANEOUS) IMPLANT

## 2024-02-21 NOTE — Progress Notes (Signed)
 ANTICOAGULATION CONSULT NOTE  Pharmacy Consult for heparin  infusion Indication: ACS/STEMI  Allergies  Allergen Reactions   Levaquin  [Levofloxacin ] Swelling   Tramadol Other (See Comments)    Seizures   Amoxicillin Nausea And Vomiting   Augmentin [Amoxicillin-Pot Clavulanate] Other (See Comments)   Erythromycin  Nausea And Vomiting   Lithium Nausea Only    Patient Measurements: Height: 5\' 6"  (167.6 cm) Weight: 83.6 kg (184 lb 4.9 oz) IBW/kg (Calculated) : 59.3 HEPARIN  DW (KG): 77  Vital Signs: Temp: 97.6 F (36.4 C) (05/29 1211) Temp Source: Oral (05/29 1211) BP: 125/81 (05/29 1211) Pulse Rate: 74 (05/29 1211)  Labs: Recent Labs    02/20/24 2139 02/20/24 2329 02/21/24 0310 02/21/24 0552 02/21/24 0901 02/21/24 1141  HGB 12.9  --  12.1  --   --   --   HCT 38.6  --  36.5  --   --   --   PLT 319  --  301  --   --   --   APTT  --  34  --   --   --   --   LABPROT  --  13.1  --   --   --   --   INR  --  1.0  --   --   --   --   HEPARINUNFRC  --   --  0.22*  --   --  0.34  CREATININE 0.85  --  0.82  --   --   --   TROPONINIHS 456* 480* 565* 692* 693* 460*    Estimated Creatinine Clearance: 91.4 mL/min (by C-G formula based on SCr of 0.82 mg/dL).   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  Goal of Therapy:  Heparin  level 0.3-0.7 units/ml Monitor platelets by anticoagulation protocol: Yes  Date Time Results Comments 05/29 0310 HL 0.22 Subtherapeutic 05/29 1141 HL 0.34 Therapeutic x 1 @ 1150 units/hr   Plan:  Continue heparin  infusion at 1150 units/hr Will check HL in 6 hr to confirm therapeutic rate CBC daily while on  heparin   Courney Garrod Rodriguez-Guzman PharmD, BCPS 02/21/2024 1:23 PM

## 2024-02-21 NOTE — H&P (Signed)
 History and Physical    Heather [name] VQQ:[id number] DOB: 1976/02/03 DOA: 02/20/2024  Referring MD/NP/PA:   PCP: Dellar Fenton, MD   Patient coming from:  The patient is coming from home.     Chief Complaint: chest pain  HPI: Heather [name] is a 48 y.o. 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  Patient states that she has intermittent chest pain for almost 4 days, which has worsened today, and becoms more persistent.  The chest pain is located in front chest, constant, severe, heavy feeling, radiating to the left arm, exertional, aggravated by bending over forward.  Associated with SOB.  Patient has dry cough, no fever or chills.  No nausea, vomiting, diarrhea or abdominal pain.  No symptoms of UTI.  No fever or chills.  No recent fall or head injury.  No rectal bleeding or dark stool.  Patient states she used inhaler without improvement at home.  She took 160 mg of aspirin at home.  Data reviewed independently and ED Course: pt was found to have troponin 456 -> 480, WBC 13.4, GFR> 60, INR 1.0, PTT 34.  Temperature normal, blood pressure 125/90, heart rate 93, RR 20, oxygen saturation 99% on room air.  Chest x-ray negative.  CTA negative for PE.  Patient is admitted to telemetry bed as inpatient.  Epic message sent to CV [facility] consult pool for [facility] consult.  CTA: 1. Negative for acute pulmonary embolism. 2. Mosaic attenuation of the lungs compatible with air trapping. This can be seen with small airways infection/inflammation.   EKG: I have personally reviewed.  Sinus rhythm, QTc 472, LAE, borderline LAD.   Review of Systems:   General: no fevers, chills, no body weight gain, has poor appetite, has fatigue HEENT: no blurry vision, hearing changes or sore throat Respiratory: has dyspnea, coughing CV: has  chest pain, no palpitations GI: no nausea, vomiting, abdominal pain, diarrhea, constipation GU: no dysuria, burning on urination, increased urinary frequency, hematuria  Ext: no leg edema Neuro: no unilateral weakness, numbness, or tingling, no vision change or hearing loss Skin: no rash, no skin tear. MSK: No muscle spasm, no deformity, no limitation of range of movement in spin Heme: No easy bruising.  Travel history: No recent long distant travel.   Allergy :  Allergies  Allergen Reactions   Levaquin  [Levofloxacin ] Swelling   Tramadol Other (See Comments)    Seizures   Amoxicillin Nausea And Vomiting   Augmentin [Amoxicillin-Pot Clavulanate] Other (See Comments)   Erythromycin  Nausea And Vomiting   Lithium Nausea Only    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 hauler    Depression    bipolar   Dysrhythmia    Fainting spell    H/O   H/O cardiac arrhythmia    Migraines    Seizures (HCC)     Past Surgical History:  Procedure Laterality Date   COLONOSCOPY WITH PROPOFOL  N/A 06/03/2021   Procedure: COLONOSCOPY WITH PROPOFOL ;  Surgeon: Irby Mannan, MD;  Location: [facility] ENDOSCOPY;  Service: Endoscopy;  Laterality: N/A;   ESOPHAGOGASTRODUODENOSCOPY N/A 06/03/2021   Procedure: ESOPHAGOGASTRODUODENOSCOPY (EGD);  Surgeon: Irby Mannan, MD;  Location: [facility] ENDOSCOPY;  Service: Endoscopy;  Laterality: N/A;   TUBAL LIGATION Bilateral 09/25/2002   tubal reversal      Social History:  reports that she  quit smoking about 12 years ago. Her smoking use included cigarettes. She started smoking about 17 years ago. She has a 2.5 pack-year smoking history. She has never used smokeless tobacco. She reports that she does not drink alcohol and does not use drugs.  Family History:  Family History  Problem Relation Age of Onset   Hypertension Mother    Diabetes Mother    Arthritis Mother    Heart disease Mother    Breast cancer Neg Hx      Prior to  Admission medications   Medication Sig Start Date End Date Taking? Authorizing Provider  CAPLYTA 42 MG capsule Take 42 mg by mouth at bedtime. 02/12/24  Yes [provider]  divalproex (DEPAKOTE ER) 500 MG 24 hr tablet Take 1,500 mg by mouth at bedtime. 01/30/24  Yes [provider]  Acetylcysteine 600 MG TBCR Take by mouth. 12/05/21   [provider]  AIRSUPRA  90-80 MCG/ACT AERO INHALE 2 PUFFS INTO THE LUNGS EVERY 6 HOURS 01/30/24   Assaker, Marianne Shirts, MD  AJOVY 225 MG/1.5ML SOAJ Inject into the skin. 07/17/23   [provider]  albuterol  (VENTOLIN  HFA) 108 (90 Base) MCG/ACT inhaler INHALE 1 TO 2 PUFFS BY MOUTH EVERY 4 HOURS AS NEEDED FOR WHEEZING FOR SHORTNESS OF BREATH 08/03/23   Dellar Fenton, MD  amphetamine-dextroamphetamine (ADDERALL XR) 20 MG 24 hr capsule Take 20 mg by mouth every morning. 07/28/22   [provider]  amphetamine-dextroamphetamine (ADDERALL) 20 MG tablet Take 20 mg by mouth daily. 08/15/23   [provider]  ascorbic acid (VITAMIN C) 1000 MG tablet Take by mouth.    [provider]  benzonatate  (TESSALON ) 100 MG capsule Take 1 capsule (100 mg total) by mouth every 8 (eight) hours. 01/01/24   Dellar Fenton, MD  cetirizine  (ZYRTEC ) 10 MG tablet Take 1 tablet by mouth once daily 10/04/22   Dellar Fenton, MD  cyanocobalamin  (VITAMIN B12) 1000 MCG/ML injection Inject into the muscle. 04/19/23   [provider]  diazepam (VALIUM) 10 MG tablet Take 1 tablet by mouth 4 (four) times daily as needed. 11/23/21   [provider]  divalproex (DEPAKOTE) 500 MG DR tablet Take by mouth.    [provider]  Dupilumab (DUPIXENT) 300 MG/2ML SOAJ Inject 300 mg into the skin every 14 (fourteen) days. **loading dose completed in clinic on 01/04/2024** 01/04/24   Assaker, Marianne Shirts, MD  fluticasone  (FLONASE ) 50 MCG/ACT nasal spray Place 1 spray into both nostrils daily. 06/29/23   Dellar Fenton, MD   fluticasone -salmeterol (ADVAIR) 500-50 MCG/ACT AEPB Inhale 1 puff into the lungs in the morning and at bedtime. 12/17/23   Assaker, Marianne Shirts, MD  guaiFENesin  (MUCINEX ) 600 MG 12 hr tablet Take 2 tablets (1,200 mg total) by mouth 2 (two) times daily as needed. 07/23/23   Roslyn Coombe, MD  ipratropium (ATROVENT ) 0.03 % nasal spray Place 2 sprays into both nostrils every 12 (twelve) hours. 08/21/23   Assaker, Marianne Shirts, MD  ipratropium-albuterol  (DUONEB) 0.5-2.5 (3) MG/3ML SOLN Inhale 3 mLs into the lungs every 4 (four) hours as needed. 04/27/23   Dellar Fenton, MD  linaclotide  (LINZESS ) 290 MCG CAPS capsule Take 1 capsule (290 mcg total) by mouth daily before breakfast. 01/15/24   Vanga, Rohini Reddy, MD  loratadine (CLARITIN) 10 MG tablet Take 10 mg by mouth daily.    [provider]  magnesium  oxide (MAG-OX) 400 MG tablet Take 1 tablet (400 mg total) by mouth daily. 07/21/22   Dellar Fenton, MD  montelukast  (  SINGULAIR ) 10 MG tablet Take 1 tablet (10 mg total) by mouth at bedtime. 01/04/24   Assaker, Marianne Shirts, MD  omeprazole  (PRILOSEC) 40 MG capsule Take 1 capsule (40 mg total) by mouth daily before breakfast. 04/27/23 01/15/24  Dellar Fenton, MD  ondansetron  (ZOFRAN -ODT) 4 MG disintegrating tablet DISSOLVE 1 TABLET IN MOUTH TWICE DAILY AS NEEDED FOR NAUSEA AND  VOMITING 02/14/24   Dellar Fenton, MD  polyethylene glycol (MIRALAX / GLYCOLAX) 17 g packet Take 17 g by mouth 2 (two) times daily.    [provider]  Rimegepant Sulfate (NURTEC) 75 MG TBDP Take by mouth. 04/20/23   [provider]  UBRELVY 100 MG TABS Take by mouth.    [provider]    Physical Exam: Vitals:   02/20/24 2136 02/20/24 2137 02/21/24 0100  BP: (!) 125/90  (!) 147/92  Pulse: 93  82  Resp: 20  19  Temp: 97.8 F (36.6 C)  98 F (36.7 C)  TempSrc: Oral  Oral  SpO2: 99%  98%  Weight:  83.9 kg   Height:  5\' 6"  (1.676 m)    General: Not in acute distress HEENT:       Eyes:  PERRL, EOMI, no jaundice       ENT: No discharge from the ears and nose, no pharynx injection, no tonsillar enlargement.        Neck: No JVD, no bruit, no mass felt. Heme: No neck lymph node enlargement. Cardiac: S1/S2, RRR, No murmurs, No gallops or rubs. Respiratory: has very minimal wheezing on left side only GI: Soft, nondistended, nontender, no rebound pain, no organomegaly, BS present. GU: No hematuria Ext: No pitting leg edema bilaterally. 1+DP/PT pulse bilaterally. Musculoskeletal: No joint deformities, No joint redness or warmth, no limitation of ROM in spin. Skin: No rashes.  Neuro: Alert, oriented X3, cranial nerves II-XII grossly intact, moves all extremities normally.  Psych: Patient is not psychotic, no suicidal or hemocidal ideation.  Labs on Admission: I have personally reviewed following labs and imaging studies  CBC: Recent Labs  Lab 02/20/24 2139  WBC 13.4*  HGB 12.9  HCT 38.6  MCV 83.5  PLT 319   Basic Metabolic Panel: Recent Labs  Lab 02/20/24 2139  NA 137  K 4.1  CL 106  CO2 21*  GLUCOSE 98  BUN 9  CREATININE 0.85  CALCIUM 9.0   GFR: Estimated Creatinine Clearance: 88.3 mL/min (by C-G formula based on SCr of 0.85 mg/dL). Liver Function Tests: No results for input(s): "AST", "ALT", "ALKPHOS", "BILITOT", "PROT", "ALBUMIN" in the last 168 hours. No results for input(s): "LIPASE", "AMYLASE" in the last 168 hours. No results for input(s): "AMMONIA" in the last 168 hours. Coagulation Profile: Recent Labs  Lab 02/20/24 2329  INR 1.0   Cardiac Enzymes: No results for input(s): "CKTOTAL", "CKMB", "CKMBINDEX", "TROPONINI" in the last 168 hours. BNP (last 3 results) No results for input(s): "PROBNP" in the last 8760 hours. HbA1C: No results for input(s): "HGBA1C" in the last 72 hours. CBG: No results for input(s): "GLUCAP" in the last 168 hours. Lipid Profile: No results for input(s): "CHOL", "HDL", "LDLCALC", "TRIG", "CHOLHDL", "LDLDIRECT" in  the last 72 hours. Thyroid  Function Tests: No results for input(s): "TSH", "T4TOTAL", "FREET4", "T3FREE", "THYROIDAB" in the last 72 hours. Anemia Panel: No results for input(s): "VITAMINB12", "FOLATE", "FERRITIN", "TIBC", "IRON", "RETICCTPCT" in the last 72 hours. Urine analysis:    Component Value Date/Time   COLORURINE Colorless 04/23/2013 0950   APPEARANCEUR Clear 04/23/2013 0950  LABSPEC 1.002 04/23/2013 0950   PHURINE 7.0 04/23/2013 0950   GLUCOSEU Negative 04/23/2013 0950   HGBUR 1+ 04/23/2013 0950   BILIRUBINUR Negative 04/23/2013 0950   KETONESUR Negative 04/23/2013 0950   PROTEINUR Negative 04/23/2013 0950   NITRITE Negative 04/23/2013 0950   LEUKOCYTESUR Negative 04/23/2013 0950   Sepsis Labs: @LABRCNTIP (procalcitonin:4,lacticidven:4) ) Recent Results (from the past 240 hours)  Resp panel by RT-PCR (RSV, Flu A&B, Covid) Anterior Nasal Swab     Status: None   Collection Time: 02/20/24 11:29 PM   Specimen: Anterior Nasal Swab  Result Value Ref Range Status   SARS Coronavirus 2 by RT PCR NEGATIVE NEGATIVE Final    Comment: (NOTE) SARS-CoV-2 target nucleic acids are NOT DETECTED.  The SARS-CoV-2 RNA is generally detectable in upper respiratory specimens during the acute phase of infection. The lowest concentration of SARS-CoV-2 viral copies this assay can detect is 138 copies/mL. A negative result does not preclude SARS-Cov-2 infection and should not be used as the sole basis for treatment or other patient management decisions. A negative result may occur with  improper specimen collection/handling, submission of specimen other than nasopharyngeal swab, presence of viral mutation(s) within the areas targeted by this assay, and inadequate number of viral copies(<138 copies/mL). A negative result must be combined with clinical observations, patient history, and epidemiological information. The expected result is Negative.  Fact Sheet for Patients:   BloggerCourse.com  Fact Sheet for Healthcare Providers:  SeriousBroker.it  This test is no t yet approved or cleared by the [place]  FDA and  has been authorized for detection and/or diagnosis of SARS-CoV-2 by FDA under an Emergency Use Authorization (EUA). This EUA will remain  in effect (meaning this test can be used) for the duration of the COVID-19 declaration under Section 564(b)(1) of the Act, 21 U.S.C.section 360bbb-3(b)(1), unless the authorization is terminated  or revoked sooner.       Influenza A by PCR NEGATIVE NEGATIVE Final   Influenza B by PCR NEGATIVE NEGATIVE Final    Comment: (NOTE) The Xpert Xpress SARS-CoV-2/FLU/RSV plus assay is intended as an aid in the diagnosis of influenza from Nasopharyngeal swab specimens and should not be used as a sole basis for treatment. Nasal washings and aspirates are unacceptable for Xpert Xpress SARS-CoV-2/FLU/RSV testing.  Fact Sheet for Patients: BloggerCourse.com  Fact Sheet for Healthcare Providers: SeriousBroker.it  This test is not yet approved or cleared by the [place]  FDA and has been authorized for detection and/or diagnosis of SARS-CoV-2 by FDA under an Emergency Use Authorization (EUA). This EUA will remain in effect (meaning this test can be used) for the duration of the COVID-19 declaration under Section 564(b)(1) of the Act, 21 U.S.C. section 360bbb-3(b)(1), unless the authorization is terminated or revoked.     Resp Syncytial Virus by PCR NEGATIVE NEGATIVE Final    Comment: (NOTE) Fact Sheet for Patients: BloggerCourse.com  Fact Sheet for Healthcare Providers: SeriousBroker.it  This test is not yet approved or cleared by the [place]  FDA and has been authorized for detection and/or diagnosis of SARS-CoV-2 by FDA under an Emergency Use  Authorization (EUA). This EUA will remain in effect (meaning this test can be used) for the duration of the COVID-19 declaration under Section 564(b)(1) of the Act, 21 U.S.C. section 360bbb-3(b)(1), unless the authorization is terminated or revoked.  Performed at [facility], [address] [place], [place] [address]      Radiological Exams on Admission:   Assessment/Plan Principal Problem:   NSTEMI (  non-ST elevated myocardial infarction) (HCC) Active Problems:   Asthma   Seizure disorder (HCC)   Hypercholesterolemia   MGUS (monoclonal gammopathy of unknown significance)   Leukocytosis   Anxiety   ADHD   Bipolar disorder (HCC)   Overweight (BMI 25.0-29.9)   Migraine   Assessment and Plan:  NSTEMI (non-ST elevated myocardial infarction) [facility]): trop 456 --> 480. CTA negative for PE.  Potential differential diagnosis is pericarditis.   - admit to tele bed as inpatient - IV heparin is started in ED - Trend Trop - prn fentanyl, Percocet, Tylenol  for pain - prn Nitroglycerin - ASA 325 mg daily (patient took 162 mg of aspirin at home, and another 162 mg of aspirin was given in ED) - Statin: started lipitor 80 mg daily - Risk factor stratification: will check FLP and A1C  - check UDS - 2d echo - Epic message sent to CV [facility] consult pool for [facility] consult.  Asthma: Patient has very minimal wheezing on the left side only, does not seem to have acute asthma exacerbation. - Bronchodilators and as needed Mucinex   Seizure disorder (HCC) -Depakote 1500 mg nightly - Seizure precaution - As needed Ativan for seizure  Hypercholesterolemia -Just restarted Lipitor  MGUS (monoclonal gammopathy of unknown significance) -Following up with Dr. Wilhelmenia Harada of hematology  Leukocytosis: WBC 13.4.  This seems to be a chronic issue.  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  No signs of infection.  No fever.  Anxiety, ADHD, Bipolar disorder (HCC) -Continue home Adderall, Valium - Patient does not  want to take her Caplyta  Overweight (BMI 25.0-29.9): Body weight 83.9 kg, BMI 29.86 - Encourage losing weight - Exercise healthy diet  Migraine headache: - As needed Nurtec    DVT ppx: on IV  Heparin   Code Status: Full code     Family Communication:  Yes, patient's husband at bed side.     Disposition Plan:  Anticipate discharge back to previous environment  Consults called:  Epic message sent to CV [facility] consult pool for [facility] consult.   Admission status and Level of care: Telemetry Cardiacas inpt        Dispo: The patient is from: Home              Anticipated d/c is to: Home              Anticipated d/c date is: 2 days              Patient currently is not medically stable to d/c.    Severity of Illness:  The appropriate patient status for this patient is INPATIENT. Inpatient status is judged to be reasonable and necessary in order to provide the required intensity of service to ensure the patient's safety. The patient's presenting symptoms, physical exam findings, and initial radiographic and laboratory data in the context of their chronic comorbidities is felt to place them at high risk for further clinical deterioration. Furthermore, it is not anticipated that the patient will be medically stable for discharge from the hospital within 2 midnights of admission.   * I certify that at the point of admission it is my clinical judgment that the patient will require inpatient hospital care spanning beyond 2 midnights from the point of admission due to high intensity of service, high risk for further deterioration and high frequency of surveillance required.*       Date of Service 02/21/2024    Fidencio Hue [facility]   If 7PM-7AM, please contact night-coverage [email or web address]  02/21/2024, 1:55 AM

## 2024-02-21 NOTE — Progress Notes (Signed)
 ANTICOAGULATION CONSULT NOTE  Pharmacy Consult for heparin infusion Indication: ACS/STEMI  Allergies  Allergen Reactions   Levaquin  [Levofloxacin ] Swelling   Tramadol Other (See Comments)    Seizures   Amoxicillin Nausea And Vomiting   Augmentin [Amoxicillin-Pot Clavulanate] Other (See Comments)   Erythromycin  Nausea And Vomiting   Lithium Nausea Only   Patient Measurements: Height: 5\' 6"  (167.6 cm) Weight: 83.6 kg (184 lb 4.9 oz) IBW/kg (Calculated) : 59.3 HEPARIN DW (KG): 77  Vital Signs: Temp: 97.4 F (36.3 C) (05/29 1506) Temp Source: Oral (05/29 1506) BP: 136/85 (05/29 1645) Pulse Rate: 74 (05/29 1645)  Labs: Recent Labs    02/20/24 2139 02/20/24 2329 02/21/24 0310 02/21/24 0552 02/21/24 0901 02/21/24 1141  HGB 12.9  --  12.1  --   --   --   HCT 38.6  --  36.5  --   --   --   PLT 319  --  301  --   --   --   APTT  --  34  --   --   --   --   LABPROT  --  13.1  --   --   --   --   INR  --  1.0  --   --   --   --   HEPARINUNFRC  --   --  0.22*  --   --  0.34  CREATININE 0.85  --  0.82  --   --   --   TROPONINIHS 456* 480* 565* 692* 693* 460*   Estimated Creatinine Clearance: 91.4 mL/min (by C-G formula based on SCr of 0.82 mg/dL).  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   Goal of Therapy:  Heparin level 0.3-0.7 units/ml Monitor platelets by anticoagulation protocol: Yes  Date Time Results Comments 05/29 0310 HL 0.22 Subtherapeutic 05/29 1141 HL 0.34 Therapeutic x 1 @ 1150 units/hr   Plan:  Patient underwent cardiac cath this afternoon - per cardiology, heparin to resume 2 hours post TR band removal  Resume heparin  infusion at previously therapeutic rate of 1150 units/hr at 2000  Will check HL 6 hours after resumption of infusion  Monitor CBC daily while on heparin  Arshia Rondon, PharmD Pharmacy Resident  02/21/2024 5:01 PM

## 2024-02-21 NOTE — Plan of Care (Signed)
  Problem: Education: Goal: Knowledge of General Education information will improve Description: Including pain rating scale, medication(s)/side effects and non-pharmacologic comfort measures Outcome: Progressing   Problem: Clinical Measurements: Goal: Respiratory complications will improve Outcome: Progressing   Problem: Clinical Measurements: Goal: Cardiovascular complication will be avoided Outcome: Progressing   Problem: Elimination: Goal: Will not experience complications related to bowel motility Outcome: Progressing   Problem: Pain Managment: Goal: General experience of comfort will improve and/or be controlled Outcome: Progressing   Problem: Safety: Goal: Ability to remain free from injury will improve Outcome: Progressing

## 2024-02-21 NOTE — Telephone Encounter (Signed)
 Pt currently admitted.

## 2024-02-21 NOTE — Progress Notes (Signed)
 ANTICOAGULATION CONSULT NOTE  Pharmacy Consult for heparin infusion Indication: ACS/STEMI  Allergies  Allergen Reactions   Levaquin  [Levofloxacin ] Swelling   Tramadol Other (See Comments)    Seizures   Amoxicillin Nausea And Vomiting   Augmentin [Amoxicillin-Pot Clavulanate] Other (See Comments)   Erythromycin  Nausea And Vomiting   Lithium Nausea Only    Patient Measurements: Height: 5\' 6"  (167.6 cm) Weight: 83.6 kg (184 lb 4.9 oz) IBW/kg (Calculated) : 59.3 HEPARIN DW (KG): 77  Vital Signs: Temp: 97.6 F (36.4 C) (05/29 0349) Temp Source: Oral (05/29 0210) BP: 127/86 (05/29 0349) Pulse Rate: 75 (05/29 0349)  Labs: Recent Labs    02/20/24 2139 02/20/24 2329 02/21/24 0310  HGB 12.9  --  12.1  HCT 38.6  --  36.5  PLT 319  --  301  APTT  --  34  --   LABPROT  --  13.1  --   INR  --  1.0  --   HEPARINUNFRC  --   --  0.22*  CREATININE 0.85  --  0.82  TROPONINIHS 456* 480* 565*    Estimated Creatinine Clearance: 91.4 mL/min (by C-G formula based on SCr of 0.82 mg/dL).   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  Goal of Therapy:  Heparin level 0.3-0.7 units/ml Monitor platelets by anticoagulation protocol: Yes  05/29 0310 HL 0.22, subtherapeutic   Plan:  Bolus 1200 units x 1 Start heparin infusion at 1150 units/hr Will check HL in 6 hr after rate change CBC daily while on heparin  Coretta Dexter, PharmD, [facility] 02/21/2024 4:38 AM

## 2024-02-21 NOTE — Progress Notes (Signed)
*  PRELIMINARY RESULTS* Echocardiogram 2D Echocardiogram has been performed.  Heather [name] 02/21/2024, 9:39 AM

## 2024-02-21 NOTE — Progress Notes (Signed)
 No charge Interim Progress Note  This patient was admitted by my colleague earlier this morning.  She was found to have NSTEMI.  Troponin has been steadily rising throughout the day today.  Cardiology was consulted.  She has been on a heparin drip.  At the time of my evaluation she reports her chest pain is starting to improve.  She is pending left heart cath today and possible PCI.  We will follow postoperatively.  Appreciate any cardiology recommendations.     02/21/2024    3:06 PM 02/21/2024   12:11 PM 02/21/2024    7:34 AM  Vitals with BMI  Systolic 132 125 387  Diastolic 88 81 89  Pulse 74 74 72

## 2024-02-21 NOTE — Consult Note (Signed)
 Cardiology Consultation   Patient ID: Heather [name] MRN: [id number]; DOB: 1976/04/10  Admit date: 02/20/2024 Date of Consult: 02/21/2024  PCP:  Dellar Fenton, MD   [facility] Providers Cardiologist: New   Patient Profile:   Heather [name] is a 48 y.o. female with a hx of asthma, "long-hauler COVID", IBS, seizure disorder, migraine headache, remote smoking history, anxiety, ADHD, Bipolar disorder, and GERD who is being seen 02/21/2024 for the evaluation of NSTEMI at the request of Dr. Marquette Sites.  History of Present Illness:   Heather [name] has not been seen by cardiology in the past. She sees long-haul COVID clinic at [place]. Reports long time fatigue and leg weakness from COVID. Says mother has CAD. She sees oncology/hematology for chronically elevated WBC. She used to smoke, she quit in 2013. No alcohol or drug history.   She presented with 4 days of chest pain and SOB. Symptoms were coming and going, seemed to be worse at night. It was on the left side and went down both arms, worse on the left. It was not like pain she had in the past. On one day, pain was 10/10. She has chronic nausea from COVID.   In the ER BP 125/90, pulse 93, RR 20, afebrile, 99%O2. HS trop 456>480. BNP 123. WBC 13. Scr 0.85, K4.1, sodium 137. Hgb 12.9. EKG showed NSR 92bpm, no significant ischemic changes. Resp panel negative. Chest CT negative for PE, showed air trapping, small airway infection/inflammation. Given ASA 325mg  . Started on IV heparin  and admitted.    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 hauler    Depression    bipolar   Dysrhythmia    Fainting spell    H/O   H/O cardiac arrhythmia    Migraines    Seizures (HCC)     Past Surgical History:  Procedure Laterality Date   COLONOSCOPY WITH PROPOFOL  N/A 06/03/2021   Procedure: COLONOSCOPY WITH PROPOFOL ;  Surgeon: Irby Mannan, MD;  Location: [facility] ENDOSCOPY;  Service: Endoscopy;  Laterality: N/A;    ESOPHAGOGASTRODUODENOSCOPY N/A 06/03/2021   Procedure: ESOPHAGOGASTRODUODENOSCOPY (EGD);  Surgeon: Irby Mannan, MD;  Location: [facility] ENDOSCOPY;  Service: Endoscopy;  Laterality: N/A;   TUBAL LIGATION Bilateral 09/25/2002   tubal reversal       Home Medications:  Prior to Admission medications   Medication Sig Start Date End Date Taking? Authorizing Provider  AIRSUPRA  90-80 MCG/ACT AERO INHALE 2 PUFFS INTO THE LUNGS EVERY 6 HOURS 01/30/24  Yes Assaker, Marianne Shirts, MD  AJOVY 225 MG/1.5ML SOAJ Inject 225 mg into the skin every 30 (thirty) days. 07/17/23  Yes [provider]  albuterol  (VENTOLIN  HFA) 108 (90 Base) MCG/ACT inhaler INHALE 1 TO 2 PUFFS BY MOUTH EVERY 4 HOURS AS NEEDED FOR WHEEZING FOR SHORTNESS OF BREATH 08/03/23  Yes Dellar Fenton, MD  amphetamine -dextroamphetamine  (ADDERALL XR) 20 MG 24 hr capsule Take 20 mg by mouth every morning. 07/28/22  Yes [provider]  ascorbic acid (VITAMIN C) 1000 MG tablet Take 500 mg by mouth daily.   Yes [provider]  benzonatate  (TESSALON ) 100 MG capsule Take 1 capsule (100 mg total) by mouth every 8 (eight) hours. Patient taking differently: Take 100 mg by mouth every 8 (eight) hours as needed for cough. 01/01/24  Yes Dellar Fenton, MD  cetirizine  (ZYRTEC ) 10 MG tablet Take 1 tablet by mouth once daily 10/04/22  Yes Dellar Fenton, MD  cyanocobalamin  (VITAMIN B12) 1000 MCG/ML injection  Inject 1,000 mcg into the muscle every 30 (thirty) days. 04/19/23  Yes [provider]  diazepam  (VALIUM ) 10 MG tablet Take 1 tablet by mouth 4 (four) times daily as needed. 11/23/21  Yes [provider]  divalproex  (DEPAKOTE  ER) 500 MG 24 hr tablet Take 1,500 mg by mouth at bedtime. 01/30/24  Yes [provider]  Dupilumab (DUPIXENT) 300 MG/2ML SOAJ Inject 300 mg into the skin every 14 (fourteen) days. **loading dose completed in clinic on 01/04/2024** 01/04/24  Yes Assaker, Marianne Shirts, MD  fluticasone   (FLONASE ) 50 MCG/ACT nasal spray Place 1 spray into both nostrils daily. 06/29/23  Yes Dellar Fenton, MD  fluticasone -salmeterol (ADVAIR) 500-50 MCG/ACT AEPB Inhale 1 puff into the lungs in the morning and at bedtime. 12/17/23  Yes Assaker, Marianne Shirts, MD  ipratropium (ATROVENT ) 0.03 % nasal spray Place 2 sprays into both nostrils every 12 (twelve) hours. 08/21/23  Yes Assaker, Marianne Shirts, MD  ipratropium-albuterol  (DUONEB) 0.5-2.5 (3) MG/3ML SOLN Inhale 3 mLs into the lungs every 4 (four) hours as needed. 04/27/23  Yes Dellar Fenton, MD  linaclotide  (LINZESS ) 290 MCG CAPS capsule Take 1 capsule (290 mcg total) by mouth daily before breakfast. Patient taking differently: Take 290 mcg by mouth daily as needed. 01/15/24  Yes Vanga, Elson Halon, MD  montelukast  (SINGULAIR ) 10 MG tablet Take 1 tablet (10 mg total) by mouth at bedtime. 01/04/24  Yes Assaker, Marianne Shirts, MD  omeprazole  (PRILOSEC) 40 MG capsule Take 1 capsule (40 mg total) by mouth daily before breakfast. 04/27/23 02/21/24 Yes Dellar Fenton, MD  ondansetron  (ZOFRAN -ODT) 4 MG disintegrating tablet DISSOLVE 1 TABLET IN MOUTH TWICE DAILY AS NEEDED FOR NAUSEA AND  VOMITING 02/14/24  Yes Dellar Fenton, MD  polyethylene glycol (MIRALAX  / GLYCOLAX ) 17 g packet Take 17 g by mouth 2 (two) times daily as needed for mild constipation or moderate constipation.   Yes [provider]  Rimegepant Sulfate  (NURTEC) 75 MG TBDP Take 75 mg by mouth daily as needed. 04/20/23  Yes [provider]  UBRELVY 100 MG TABS Take 100 mg by mouth as needed.   Yes [provider]  CAPLYTA 42 MG capsule Take 42 mg by mouth at bedtime. Patient not taking: Reported on 02/21/2024 02/12/24   [provider]  guaiFENesin  (MUCINEX ) 600 MG 12 hr tablet Take 2 tablets (1,200 mg total) by mouth 2 (two) times daily as needed. 07/23/23   Roslyn Coombe, MD    Inpatient Medications: Scheduled Meds:  amphetamine -dextroamphetamine   20 mg Oral Daily    aspirin  EC  81 mg Oral Daily   atorvastatin   80 mg Oral Daily   divalproex   1,500 mg Oral QHS   fluticasone   1 spray Each Nare Daily   fluticasone  furoate-vilanterol  1 puff Inhalation Daily   ipratropium-albuterol   3 mL Nebulization Q4H   loratadine   10 mg Oral Daily   montelukast   10 mg Oral QHS   pantoprazole   40 mg Oral Daily   Continuous Infusions:  heparin  1,150 Units/hr (02/21/24 0511)   PRN Meds: acetaminophen , albuterol , dextromethorphan-guaiFENesin , diazepam , fentaNYL  (SUBLIMAZE ) injection, hydrALAZINE , linaclotide , LORazepam , nitroGLYCERIN , ondansetron  (ZOFRAN ) IV, mouth rinse, oxyCODONE -acetaminophen , polyethylene glycol, Rimegepant Sulfate   Allergies:    Allergies  Allergen Reactions   Levaquin  [Levofloxacin ] Swelling   Tramadol Other (See Comments)    Seizures   Amoxicillin Nausea And Vomiting   Augmentin [Amoxicillin-Pot Clavulanate] Other (See Comments)   Erythromycin  Nausea And Vomiting   Lithium Nausea Only    Social History:   Social History  Socioeconomic History   Marital status: Married    Spouse name: Not on file   Number of children: Not on file   Years of education: Not on file   Highest education level: Not on file  Occupational History   Not on file  Tobacco Use   Smoking status: Former    Current packs/day: 0.00    Average packs/day: 0.5 packs/day for 5.0 years (2.5 ttl pk-yrs)    Types: Cigarettes    Start date: 2008    Quit date: 2013    Years since quitting: 12.4   Smokeless tobacco: Never   Tobacco comments:    Quit mid October 2014  Vaping Use   Vaping status: Never Used  Substance and Sexual Activity   Alcohol use: No    Alcohol/week: 0.0 standard drinks of alcohol   Drug use: No   Sexual activity: Yes    Birth control/protection: None  Other Topics Concern   Not on file  Social History Narrative   Not on file   Social Drivers of Health   Financial Resource Strain: Medium Risk (10/30/2023)   Overall Financial  Resource Strain (CARDIA)    Difficulty of Paying Living Expenses: Somewhat hard  Food Insecurity: Food Insecurity Present (02/21/2024)   Hunger Vital Sign    Worried About Running Out of Food in the Last Year: Sometimes true    Ran Out of Food in the Last Year: Sometimes true  Transportation Needs: Unknown (02/21/2024)   PRAPARE - Transportation    Lack of Transportation (Medical): No    Lack of Transportation (Non-Medical): Patient declined  Physical Activity: Unknown (10/30/2023)   Exercise Vital Sign    Days of Exercise per Week: Patient declined    Minutes of Exercise per Session: Not on file  Stress: Stress Concern Present (10/30/2023)   [employer] of Occupational Health - Occupational Stress Questionnaire    Feeling of Stress : To some extent  Social Connections: Unknown (10/30/2023)   Social Connection and Isolation Panel [NHANES]    Frequency of Communication with Friends and Family: Patient declined    Frequency of Social Gatherings with Friends and Family: Never    Attends Religious Services: Never    Database administrator or Organizations: No    Attends Engineer, structural: Not on file    Marital Status: Patient declined  Intimate Partner Violence: Patient Unable To Answer (02/21/2024)   Humiliation, Afraid, Rape, and Kick questionnaire    Fear of Current or Ex-Partner: Patient unable to answer    Emotionally Abused: Patient unable to answer    Physically Abused: Patient unable to answer    Sexually Abused: Patient unable to answer    Family History:    Family History  Problem Relation Age of Onset   Hypertension Mother    Diabetes Mother    Arthritis Mother    Heart disease Mother    Breast cancer Neg Hx      ROS:  Please see the history of present illness.   All other ROS reviewed and negative.     Physical Exam/Data:   Vitals:   02/21/24 0349 02/21/24 0731 02/21/24 0734 02/21/24 0743  BP: 127/86 128/87 129/89   Pulse: 75 71 72   Resp: 16  (!) 22    Temp: 97.6 F (36.4 C) 97.6 F (36.4 C)    TempSrc:  Oral    SpO2: 100% 99%  98%  Weight:      Height:  Intake/Output Summary (Last 24 hours) at 02/21/2024 0928 Last data filed at 02/21/2024 0657 Gross per 24 hour  Intake 83.75 ml  Output --  Net 83.75 ml      02/21/2024    2:06 AM 02/20/2024    9:37 PM 01/15/2024    3:15 PM  Last 3 Weights  Weight (lbs) 184 lb 4.9 oz 185 lb 197 lb 8 oz  Weight (kg) 83.6 kg 83.915 kg 89.585 kg     Body mass index is 29.75 kg/m.  General:  Well nourished, well developed, in no acute distress HEENT: normal Neck: no JVD Vascular: No carotid bruits; Distal pulses 2+ bilaterally Cardiac:  normal S1, S2; RRR; no murmur  Lungs:  clear to auscultation bilaterally, no wheezing, rhonchi or rales  Abd: soft, nontender, no hepatomegaly  Ext: no edema Musculoskeletal:  No deformities, BUE and BLE strength normal and equal Skin: warm and dry  Neuro:  CNs 2-12 intact, no focal abnormalities noted Psych:  Normal affect   EKG:  The EKG was personally reviewed and demonstrates:  NSR, 92bpm, no St/T wave changes Telemetry:  Telemetry was personally reviewed and demonstrates:  Nsr HR 70-80s  Relevant CV Studies:  Echo ordered  Laboratory Data:  High Sensitivity Troponin:   Recent Labs  Lab 02/20/24 2139 02/20/24 2329 02/21/24 0310 02/21/24 0552  TROPONINIHS 456* 480* 565* 692*     Chemistry Recent Labs  Lab 02/20/24 2139 02/21/24 0310  NA 137 138  K 4.1 3.6  CL 106 106  CO2 21* 25  GLUCOSE 98 90  BUN 9 10  CREATININE 0.85 0.82  CALCIUM 9.0 8.8*  GFRNONAA >60 >60  ANIONGAP 10 7    No results for input(s): "PROT", "ALBUMIN", "AST", "ALT", "ALKPHOS", "BILITOT" in the last 168 hours. Lipids  Recent Labs  Lab 02/21/24 0310  CHOL 187  TRIG 230*  HDL 35*  LDLCALC 106*  CHOLHDL 5.3    Hematology Recent Labs  Lab 02/20/24 2139 02/21/24 0310  WBC 13.4* 13.2*  RBC 4.62 4.35  HGB 12.9 12.1  HCT 38.6 36.5  MCV  83.5 83.9  MCH 27.9 27.8  MCHC 33.4 33.2  RDW 14.4 14.3  PLT 319 301   Thyroid  No results for input(s): "TSH", "FREET4" in the last 168 hours.  BNP Recent Labs  Lab 02/20/24 2329  BNP 123.0*    DDimer No results for input(s): "DDIMER" in the last 168 hours.   Radiology/Studies:  CT Angio Chest PE W/Cm &/Or Wo Cm Result Date: 02/21/2024 CLINICAL DATA:  Shortness of breath and chest pain intermittently for approximately 1 month. Worse today. Cough. EXAM: CT ANGIOGRAPHY CHEST WITH CONTRAST TECHNIQUE: Multidetector CT imaging of the chest was performed using the standard protocol during bolus administration of intravenous contrast. Multiplanar CT image reconstructions and MIPs were obtained to evaluate the vascular anatomy. RADIATION DOSE REDUCTION: This exam was performed according to the departmental dose-optimization program which includes automated exposure control, adjustment of the mA and/or kV according to patient size and/or use of iterative reconstruction technique. CONTRAST:  75mL OMNIPAQUE IOHEXOL 350 MG/ML SOLN COMPARISON:  Radiograph earlier today FINDINGS: Cardiovascular: Negative for acute pulmonary embolism. Normal caliber thoracic aorta. No pericardial effusion. Mediastinum/Nodes: Trachea and esophagus are unremarkable. No thoracic adenopathy Lungs/Pleura: Scattered bandlike opacities compatible with atelectasis. Otherwise no focal consolidation, pleural effusion, or pneumothorax. Mosaic attenuation of the lungs compatible with air trapping. Upper Abdomen: No acute abnormality. Musculoskeletal: No acute fracture. Review of the MIP images confirms the above findings. IMPRESSION:  1. Negative for acute pulmonary embolism. 2. Mosaic attenuation of the lungs compatible with air trapping. This can be seen with small airways infection/inflammation. Electronically Signed   By: Rozell Cornet M.D.   On: 02/21/2024 00:00   DG Chest 2 View Result Date: 02/20/2024 CLINICAL DATA:  Chest pain  and shortness of breath. EXAM: CHEST - 2 VIEW COMPARISON:  July 30, 2023 FINDINGS: The heart size and mediastinal contours are within normal limits. Mild linear scarring and/or atelectasis is seen within the retrocardiac region of the left lung base. There is no evidence of acute infiltrate, pleural effusion or pneumothorax. The visualized skeletal structures are unremarkable. IMPRESSION: No active cardiopulmonary disease. Electronically Signed   By: Virgle Grime M.D.   On: 02/20/2024 21:59     Assessment and Plan:   NSTEMI - presented with chest pain and SOB for the last 4 days - no prior CAD history. RF include family history, HLD and remote smoking history - HS trop [phone]. Continue to trend troponin - continue IV heparin - given ASA 325mg  - continue ASA 81mg  daily and Lipitor 80mg  daily - echo ordered - she is NPO. plan for LHC today Risks and benefits of cardiac catheterization have been discussed with the patient.  These include bleeding, infection, kidney damage, stroke, heart attack, death.  The patient understands these risks and is willing to proceed.  HLD - LDL 106 - continue Lipitor 80mg  daily  Asthma Long-haul COVID - follows with long-haul COVID clinic as OP - per IM  Leukocytosis - this is a chronic issue followed by heme/onc as OP  Risk Assessment/Risk Scores:     TIMI Risk Score for Unstable Angina or Non-ST Elevation MI:   The patient's TIMI risk score is 3, which indicates a 13% risk of all cause mortality, new or recurrent myocardial infarction or need for urgent revascularization in the next 14 days.   For questions or updates, please contact [facility] Please consult [email or web address] for contact info under    Signed, Cartrell Bentsen Rebekah Canada, PA-C  02/21/2024 9:28 AM

## 2024-02-21 NOTE — TOC Initial Note (Signed)
 Transition of Care [facility]) - Initial/Assessment Note    Patient Details  Name: Heather [name] MRN: [id number] Date of Birth: [birth year]  Transition of [facility]) CM/SW Contact:    Marino Sias, RN Phone Number: 02/21/2024, 12:08 PM  Clinical Narrative:  Met with patient at bedside. Patient alert and oriented x3, complains of pain and chronic fatigue. Lives with spouse, daughter and two grandsons. No DME, does Speech Therapy at [facility] and Virtual OT. Mother or spouse will transport at discharge.                       Patient Goals and CMS Choice            Expected Discharge Plan and Services                                              Prior Living Arrangements/Services                       Activities of Daily Living   ADL Screening (condition at time of admission) Independently performs ADLs?: No Does the patient have a NEW difficulty with bathing/dressing/toileting/self-feeding that is expected to last >3 days?: Yes (Initiates electronic notice to provider for possible OT consult) Does the patient have a NEW difficulty with getting in/out of bed, walking, or climbing stairs that is expected to last >3 days?: Yes (Initiates electronic notice to provider for possible PT consult) Does the patient have a NEW difficulty with communication that is expected to last >3 days?: No Is the patient deaf or have difficulty hearing?: No Does the patient have difficulty seeing, even when wearing glasses/contacts?: No Does the patient have difficulty concentrating, remembering, or making decisions?: No  Permission Sought/Granted                  Emotional Assessment              Admission diagnosis:  Unstable angina (HCC) [I20.0] NSTEMI (non-ST elevated myocardial infarction) [facility]) [I21.4] Patient Active Problem List   Diagnosis Date Noted   NSTEMI (non-ST elevated myocardial infarction) (HCC) 02/21/2024   Bipolar disorder (HCC) 02/21/2024    Overweight (BMI 25.0-29.9) 02/21/2024   Hypokalemia 09/14/2023   COVID-19 virus infection 07/24/2023   Acute sinusitis 07/13/2023   Menstrual changes 04/27/2023   Leukocytosis 02/23/2023   B12 deficiency 01/28/2023   Leg pain 07/19/2022   ADHD 06/30/2022   Irritable bowel syndrome (IBS) 06/29/2022   Abnormal Pap smear of cervix 03/15/2022   Constipation 01/05/2022   Non-intractable vomiting    Hiatal hernia    Retained food in stomach    Special screening for malignant neoplasms, colon    Rectal polyp    Asthmatic bronchitis with acute exacerbation 05/27/2021   Pre-op evaluation 05/24/2021   Anxiety 02/27/2021   Cough 02/25/2021   Vitamin D  deficiency 02/04/2021   Fatigue 09/29/2020   Nausea 09/12/2020   Encounter for completion of form with patient 07/18/2020   History of COVID-19 06/11/2020   Bronchitis 09/22/2019   Sleep disturbance 03/16/2019   Herpes zoster without complication 01/29/2019   History of seizure 01/22/2019   GERD (gastroesophageal reflux disease) 01/05/2019   Back pain 12/21/2018   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 [place] [address] Phone: [phone] Fax: [phone]  [facility] [address] [place] - [address] [place] [address] Phone: [phone] Fax: [phone]  [facility] [place], [place] - [address] [facility] [address] Mail Order for  Specialty Drugs [place] [address] Phone: [phone] Fax: [phone]     Social Drivers of Health (SDOH) Social History: SDOH Screenings   Food Insecurity: Food Insecurity Present (02/21/2024)  Housing: High Risk (02/21/2024)  Transportation Needs: Unknown (02/21/2024)  Utilities: Patient Unable To Answer (02/21/2024)  Depression (PHQ2-9): Low Risk  (04/27/2023)  Financial Resource Strain: Medium Risk (10/30/2023)  Physical Activity: Unknown (10/30/2023)  Social Connections: Unknown (10/30/2023)  Stress: Stress Concern Present (10/30/2023)  Tobacco Use: Medium Risk (02/20/2024)   SDOH Interventions:     Readmission Risk Interventions     No data to display

## 2024-02-21 NOTE — Progress Notes (Signed)
*  PRELIMINARY RESULTS* Echocardiogram 2D Echocardiogram has been performed.  Heather [name] 02/21/2024, 9:38 AM

## 2024-02-22 ENCOUNTER — Telehealth: Payer: Self-pay | Admitting: Internal Medicine

## 2024-02-22 ENCOUNTER — Encounter: Payer: Self-pay | Admitting: Cardiovascular Disease

## 2024-02-22 ENCOUNTER — Other Ambulatory Visit (HOSPITAL_COMMUNITY): Payer: Self-pay

## 2024-02-22 DIAGNOSIS — I214 Non-ST elevation (NSTEMI) myocardial infarction: Secondary | ICD-10-CM | POA: Diagnosis not present

## 2024-02-22 DIAGNOSIS — J4540 Moderate persistent asthma, uncomplicated: Secondary | ICD-10-CM | POA: Diagnosis not present

## 2024-02-22 DIAGNOSIS — E7800 Pure hypercholesterolemia, unspecified: Secondary | ICD-10-CM | POA: Diagnosis not present

## 2024-02-22 DIAGNOSIS — G40909 Epilepsy, unspecified, not intractable, without status epilepticus: Secondary | ICD-10-CM | POA: Diagnosis not present

## 2024-02-22 LAB — CBC WITH DIFFERENTIAL/PLATELET
Abs Immature Granulocytes: 0.07 10*3/uL (ref 0.00–0.07)
Basophils Absolute: 0.1 10*3/uL (ref 0.0–0.1)
Basophils Relative: 1 %
Eosinophils Absolute: 0.1 10*3/uL (ref 0.0–0.5)
Eosinophils Relative: 1 %
HCT: 37.3 % (ref 36.0–46.0)
Hemoglobin: 12.9 g/dL (ref 12.0–15.0)
Immature Granulocytes: 1 %
Lymphocytes Relative: 25 %
Lymphs Abs: 3 10*3/uL (ref 0.7–4.0)
MCH: 28.5 pg (ref 26.0–34.0)
MCHC: 34.6 g/dL (ref 30.0–36.0)
MCV: 82.3 fL (ref 80.0–100.0)
Monocytes Absolute: 0.6 10*3/uL (ref 0.1–1.0)
Monocytes Relative: 5 %
Neutro Abs: 8.2 10*3/uL — ABNORMAL HIGH (ref 1.7–7.7)
Neutrophils Relative %: 67 %
Platelets: 309 10*3/uL (ref 150–400)
RBC: 4.53 MIL/uL (ref 3.87–5.11)
RDW: 14.3 % (ref 11.5–15.5)
WBC: 12.2 10*3/uL — ABNORMAL HIGH (ref 4.0–10.5)
nRBC: 0 % (ref 0.0–0.2)

## 2024-02-22 LAB — PHOSPHORUS: Phosphorus: 3.5 mg/dL (ref 2.5–4.6)

## 2024-02-22 LAB — URINE DRUG SCREEN, QUALITATIVE (ARMC ONLY)
Amphetamines, Ur Screen: NOT DETECTED
Barbiturates, Ur Screen: NOT DETECTED
Benzodiazepine, Ur Scrn: POSITIVE — AB
Cannabinoid 50 Ng, Ur ~~LOC~~: NOT DETECTED
Cocaine Metabolite,Ur ~~LOC~~: NOT DETECTED
MDMA (Ecstasy)Ur Screen: NOT DETECTED
Methadone Scn, Ur: NOT DETECTED
Opiate, Ur Screen: NOT DETECTED
Phencyclidine (PCP) Ur S: NOT DETECTED
Tricyclic, Ur Screen: NOT DETECTED

## 2024-02-22 LAB — COMPREHENSIVE METABOLIC PANEL WITH GFR
ALT: 40 U/L (ref 0–44)
AST: 48 U/L — ABNORMAL HIGH (ref 15–41)
Albumin: 3.4 g/dL — ABNORMAL LOW (ref 3.5–5.0)
Alkaline Phosphatase: 123 U/L (ref 38–126)
Anion gap: 9 (ref 5–15)
BUN: 8 mg/dL (ref 6–20)
CO2: 24 mmol/L (ref 22–32)
Calcium: 8.7 mg/dL — ABNORMAL LOW (ref 8.9–10.3)
Chloride: 106 mmol/L (ref 98–111)
Creatinine, Ser: 0.74 mg/dL (ref 0.44–1.00)
GFR, Estimated: >60 mL/min (ref >60)
Glucose, Bld: 78 mg/dL (ref 70–99)
Potassium: 3.9 mmol/L (ref 3.5–5.1)
Sodium: 139 mmol/L (ref 135–145)
Total Bilirubin: 0.7 mg/dL (ref 0.0–1.2)
Total Protein: 6.1 g/dL — ABNORMAL LOW (ref 6.5–8.1)

## 2024-02-22 LAB — HEPARIN LEVEL (UNFRACTIONATED)
Heparin Unfractionated: 0.19 [IU]/mL — ABNORMAL LOW (ref 0.30–0.70)
Heparin Unfractionated: 0.4 [IU]/mL (ref 0.30–0.70)
Heparin Unfractionated: 0.29 [IU]/mL — ABNORMAL LOW (ref 0.30–0.70)

## 2024-02-22 LAB — MAGNESIUM: Magnesium: 2.2 mg/dL (ref 1.7–2.4)

## 2024-02-22 MED ORDER — HEPARIN (PORCINE) 25000 UT/250ML-% IV SOLN: 1350.0000 [IU]/h | INTRAVENOUS | Status: DC

## 2024-02-22 MED ORDER — ENSURE PLUS HIGH PROTEIN PO LIQD
237.0000 mL | Freq: Two times a day (BID) | ORAL | Status: DC
  Administered 2024-02-22: 237 mL via ORAL

## 2024-02-22 MED ORDER — NITROGLYCERIN IN D5W 200-5 MCG/ML-% IV SOLN
0.0000 ug/min | INTRAVENOUS | Status: DC
  Administered 2024-02-22: 5 ug/min via INTRAVENOUS
  Filled 2024-02-22: qty 250

## 2024-02-22 MED ORDER — ASPIRIN 81 MG PO TBEC: 81.0000 mg | DELAYED_RELEASE_TABLET | Freq: Every day | ORAL | Status: AC

## 2024-02-22 MED ORDER — ATORVASTATIN CALCIUM 80 MG PO TABS: 80.0000 mg | ORAL_TABLET | Freq: Every day | ORAL | Status: DC

## 2024-02-22 MED ORDER — NITROGLYCERIN IN D5W 200-5 MCG/ML-% IV SOLN: 0.0000 ug/min | INTRAVENOUS | Status: DC

## 2024-02-22 MED ORDER — HEPARIN BOLUS VIA INFUSION
2300.0000 [IU] | Freq: Once | INTRAVENOUS | Status: AC
  Administered 2024-02-22: 2300 [IU] via INTRAVENOUS
  Filled 2024-02-22: qty 2300

## 2024-02-22 NOTE — Telephone Encounter (Signed)
 Patient's mother dropped off paper work for Dr Geralyn Knee to complete. FMLA paper work is upfront in [employer]  Mother would like Dr Geralyn Knee to know that patient had a heart attack on Wednesday. She is at [facility] getting ready to be transported to [facility].

## 2024-02-22 NOTE — Plan of Care (Signed)

## 2024-02-22 NOTE — Plan of Care (Signed)
   Problem: Education: Goal: Knowledge of General Education information will improve Description: Including pain rating scale, medication(s)/side effects and non-pharmacologic comfort measures Outcome: Progressing   Problem: Clinical Measurements: Goal: Ability to maintain clinical measurements within normal limits will improve Outcome: Progressing Goal: Respiratory complications will improve Outcome: Progressing   Problem: Activity: Goal: Risk for activity intolerance will decrease Outcome: Progressing   Problem: Nutrition: Goal: Adequate nutrition will be maintained Outcome: Progressing

## 2024-02-22 NOTE — Progress Notes (Signed)
 ANTICOAGULATION CONSULT NOTE  Pharmacy Consult for heparin infusion Indication: ACS/STEMI  Allergies  Allergen Reactions   Levaquin  [Levofloxacin ] Swelling   Tramadol Other (See Comments)    Seizures   Amoxicillin Nausea And Vomiting   Augmentin [Amoxicillin-Pot Clavulanate] Other (See Comments)   Erythromycin  Nausea And Vomiting   Lithium Nausea Only   Patient Measurements: Height: 5\' 6"  (167.6 cm) Weight: 83.6 kg (184 lb 4.9 oz) IBW/kg (Calculated) : 59.3 HEPARIN DW (KG): 77  Vital Signs: Temp: 98.3 F (36.8 C) (05/29 2326) Temp Source: Oral (05/29 2024) BP: 141/92 (05/29 2326) Pulse Rate: 79 (05/29 2326)  Labs: Recent Labs    02/20/24 2139 02/20/24 2329 02/21/24 0310 02/21/24 0552 02/21/24 0901 02/21/24 1141 02/22/24 0143  HGB 12.9  --  12.1  --   --   --  12.9  HCT 38.6  --  36.5  --   --   --  37.3  PLT 319  --  301  --   --   --  309  APTT  --  34  --   --   --   --   --   LABPROT  --  13.1  --   --   --   --   --   INR  --  1.0  --   --   --   --   --   HEPARINUNFRC  --   --  0.22*  --   --  0.34 0.19*  CREATININE 0.85  --  0.82  --   --   --  0.74  TROPONINIHS 456* 480* 565* 692* 693* 460*  --    Estimated Creatinine Clearance: 93.7 mL/min (by C-G formula based on SCr of 0.74 mg/dL).  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   Goal of Therapy:  Heparin level 0.3-0.7 units/ml Monitor platelets by anticoagulation protocol: Yes  Date Time Results Comments 05/29 0310 HL 0.22 Subtherapeutic 05/29 1141 HL 0.34 Therapeutic x 1 @ 1150 units/hr 5/30 0143 HL 0.19 Subtherapeutic   Plan:  Bolus 2300 units x 1 Increase  heparin infusion rate to 1350 units/hr Will check HL 6 hours after rate change Monitor CBC daily while on heparin  Coretta Dexter, PharmD, [facility] 02/22/2024 3:22 AM

## 2024-02-22 NOTE — Progress Notes (Addendum)
 Progress Note  Patient Name: Heather [name] Date of Encounter: 02/22/2024 [facility] Cardiologist: New  Interval Summary    LHC showed severe 1V CAD at ostial LCX not optimal for PCI. Plan to discuss at heart team meeting. Labs stable this AM. Patient reports 4/10 chest pain. Cath site is stable.  Vital Signs Vitals:   02/21/24 2326 02/22/24 0408 02/22/24 0731 02/22/24 0741  BP: (!) 141/92 (!) 146/93  (!) 140/81  Pulse: 79 80  78  Resp: 18 16  20   Temp: 98.3 F (36.8 C) 98.3 F (36.8 C)  97.8 F (36.6 C)  TempSrc:  Oral  Oral  SpO2: 98% 96% 97% 96%  Weight:      Height:        Intake/Output Summary (Last 24 hours) at 02/22/2024 0937 Last data filed at 02/22/2024 0500 Gross per 24 hour  Intake 369.03 ml  Output 800 ml  Net -430.97 ml      02/21/2024    2:06 AM 02/20/2024    9:37 PM 01/15/2024    3:15 PM  Last 3 Weights  Weight (lbs) 184 lb 4.9 oz 185 lb 197 lb 8 oz  Weight (kg) 83.6 kg 83.915 kg 89.585 kg      Telemetry/ECG  NSR HR 70s- Personally Reviewed  Physical Exam  GEN: No acute distress.   Neck: No JVD Cardiac: RRR, no murmurs, rubs, or gallops.  Respiratory: Clear to auscultation bilaterally. GI: Soft, nontender, non-distended  MS: No edema   Relevant CV Studies:  LHC 02/21/24    Dist LM to Ost LAD lesion is 30% stenosed.   Ost Cx to Prox Cx lesion is 90% stenosed.   Prox RCA lesion is 20% stenosed.   RPAV lesion is 20% stenosed.   1.  Severe one-vessel coronary artery disease affecting the ostial left circumflex.  There is mild distal left main and ostial LAD stenosis. 2.  Left ventricular angiography was not performed.  EF was normal by echo. 3.  Moderately elevated left ventricular end-diastolic pressure at 22 mmHg.   Coronary Diagrams  Diagnostic Dominance: Right  Intervention  Recommendations: The ostial left circumflex stenosis is not optimal for PCI and likely requires bifurcation stenting in order to preserve the LAD.   Will discuss tomorrow during the heart team meeting. Resume heparin drip 2 hours after TR band removal.       Echo 01/2024 1. Left ventricular ejection fraction, by estimation, is 55 to 60%. The  left ventricle has normal function. The left ventricle has no regional  wall motion abnormalities. Left ventricular diastolic parameters were  normal.   2. Right ventricular systolic function is normal. The right ventricular  size is normal.   3. The mitral valve is normal in structure. Mild mitral valve  regurgitation.   4. The aortic valve is tricuspid. Aortic valve regurgitation is not  visualized.   Heart monitor 01/21/24 HR 70- 130, average 93 bpm. Rare supraventricular and ventricular ectopy. No sustained arrhythmias. No atrial fibrillation.   Heather [name] T. Marven Slimmer, MD, [facility], [facility] Cardiac Electrophysiology  Assessment & Plan   NSTEMI - presented with chest pain and SOB for the last 4 days found to have elevated troponin and taken for heart cath - RF include family history, HLD and remote smoking history - HS trop [id number] started on IV heparin - LHC showed severe 1 V CAD with 90% in ostial Lcx., which is not optimal for PCI and will required bifurcation stent, plan to discuss at  heart team meeting - continue ASA 81mg  daily and Lipitor 80mg  daily - echo showed normal LVEF - still with mild chest discomfort, will start nitroglycerin infusion - patient would like to transfer to [facility] it higher level of care is required   HLD - LDL 106 - continue Lipitor 80mg  daily   Asthma Long-haul COVID - follows with long-haul COVID clinic as OP - per IM   Leukocytosis - this is a chronic issue followed by heme/onc as OP    For questions or updates, please contact [facility] Please consult [email or web address] for contact info under       Signed, Heather Rebekah Canada, PA-C      I have seen and examined the patient. I agree with the above note with the addition of : She continues  to have intermittent chest tightness that improved with nitroglycerin drip.  By physical exam, heart is regular with no murmurs.  Lungs are clear to auscultation.  Right radial pulse is normal with no hematoma.  Recommendations: Non-ST elevation myocardial infarction: She continues to have intermittent angina.  I agree with nitroglycerin drip.  Continue heparin drip as well.  We discussed the case this morning during the heart team meeting.  CABG is an option but limited by small vessel diameter and lack of obstructive disease in the LAD and right coronary artery. We felt that PCI of the left circumflex with the intention of using a drug-coated balloon without placing a stent might be the best option if her angina cannot be controlled with medications. I had a prolonged discussion with the patient and she is very anxious and hesitant about not getting something done to treat the stenosis. She is requesting transfer to [facility] for management.  I called the [facility] transfer center and requested transfer.  Will also try to discuss the case with Dr. Lydia [name] at [facility].  Heather Kirks MD, FACC 02/22/2024 3:19 PM

## 2024-02-22 NOTE — Progress Notes (Signed)
 Patient being transferred to [facility].  Patient to be transported by [facility].  Patient to be transported with IV's intact and infusions running.  AVS and other appropriate paperwork placed in the packet for transfer.  Report called to Camilo Cella at [facility].  Patient to be transferred to room 7121 under the care of Dr. Ronny Colas.

## 2024-02-22 NOTE — Progress Notes (Signed)
 ANTICOAGULATION CONSULT NOTE  Pharmacy Consult for heparin  infusion Indication: ACS/STEMI  Allergies  Allergen Reactions   Levaquin  [Levofloxacin ] Swelling   Tramadol Other (See Comments)    Seizures   Amoxicillin Nausea And Vomiting   Augmentin [Amoxicillin-Pot Clavulanate] Other (See Comments)   Erythromycin  Nausea And Vomiting   Lithium Nausea Only   Patient Measurements: Height: 5\' 6"  (167.6 cm) Weight: 83.6 kg (184 lb 4.9 oz) IBW/kg (Calculated) : 59.3 HEPARIN  DW (KG): 77  Vital Signs: Temp: 97.8 F (36.6 C) (05/30 0741) Temp Source: Oral (05/30 0741) BP: 140/81 (05/30 0741) Pulse Rate: 78 (05/30 0741)  Labs: Recent Labs    02/20/24 2139 02/20/24 2139 02/20/24 2329 02/21/24 0310 02/21/24 0552 02/21/24 0901 02/21/24 1141 02/22/24 0143 02/22/24 0947  HGB 12.9  --   --  12.1  --   --   --  12.9  --   HCT 38.6  --   --  36.5  --   --   --  37.3  --   PLT 319  --   --  301  --   --   --  309  --   APTT  --   --  34  --   --   --   --   --   --   LABPROT  --   --  13.1  --   --   --   --   --   --   INR  --   --  1.0  --   --   --   --   --   --   HEPARINUNFRC  --    < >  --  0.22*  --   --  0.34 0.19* 0.40  CREATININE 0.85  --   --  0.82  --   --   --  0.74  --   TROPONINIHS 456*  --  480* 565* 692* 693* 460*  --   --    < > = values in this interval not displayed.   Estimated Creatinine Clearance: 93.7 mL/min (by C-G formula based on SCr of 0.74 mg/dL).  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   Goal of Therapy:  Heparin  level 0.3-0.7 units/ml Monitor platelets by anticoagulation protocol:  Yes  Date Time Results Comments 05/29 0310 HL 0.22 Subtherapeutic 05/29 1141 HL 0.34 Therapeutic x 1 @ 1150 units/hr 05/30 0143 HL 0.19 Subtherapeutic 05/30 0947 HL 0.40 Therapeutic x 1 @ 1350 units/hr   Plan:  Continue heparin  infusion rate at 1350 units/hr. Will repeat HL in 6 hours to confirm therapeutic rate. Possible transfer to [facility] for bifurcation stenting pending discussion during heart team meeting. Monitor CBC daily while on heparin .  Sherena Machorro Rodriguez-Guzman PharmD, BCPS 02/22/2024 10:54 AM

## 2024-02-22 NOTE — Progress Notes (Signed)
 ANTICOAGULATION CONSULT NOTE  Pharmacy Consult for heparin infusion Indication: ACS/STEMI  Allergies  Allergen Reactions   Levaquin  [Levofloxacin ] Swelling   Tramadol Other (See Comments)    Seizures   Amoxicillin Nausea And Vomiting   Augmentin [Amoxicillin-Pot Clavulanate] Other (See Comments)   Erythromycin  Nausea And Vomiting   Lithium Nausea Only   Patient Measurements: Height: 5\' 6"  (167.6 cm) Weight: 83.6 kg (184 lb 4.9 oz) IBW/kg (Calculated) : 59.3 HEPARIN DW (KG): 77  Vital Signs: Temp: 97.6 F (36.4 C) (05/30 1514) Temp Source: Oral (05/30 1109) BP: 123/80 (05/30 1639) Pulse Rate: 79 (05/30 1639)  Labs: Recent Labs    02/20/24 2139 02/20/24 2139 02/20/24 2329 02/21/24 0310 02/21/24 0552 02/21/24 0901 02/21/24 1141 02/22/24 0143 02/22/24 0947 02/22/24 1642  HGB 12.9  --   --  12.1  --   --   --  12.9  --   --   HCT 38.6  --   --  36.5  --   --   --  37.3  --   --   PLT 319  --   --  301  --   --   --  309  --   --   APTT  --   --  34  --   --   --   --   --   --   --   LABPROT  --   --  13.1  --   --   --   --   --   --   --   INR  --   --  1.0  --   --   --   --   --   --   --   HEPARINUNFRC  --    < >  --  0.22*  --   --  0.34 0.19* 0.40 0.29*  CREATININE 0.85  --   --  0.82  --   --   --  0.74  --   --   TROPONINIHS 456*  --  480* 565* 692* 693* 460*  --   --   --    < > = values in this interval not displayed.   Estimated Creatinine Clearance: 93.7 mL/min (by C-G formula based on SCr of 0.74 mg/dL).  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   Goal of Therapy:  Heparin level 0.3-0.7 units/ml Monitor platelets by  anticoagulation protocol: Yes  Date Time Results Comments 05/29 0310 HL 0.22 Subtherapeutic 05/29 1141 HL 0.34 Therapeutic x 1 @ 1150 units/hr 05/30 0143 HL 0.19 Subtherapeutic 05/30 0947 HL 0.40 Therapeutic x 1 @ 1350 units/hr 05/30 1642 HL 0.29 Subtherapeutic   Plan:  HL slightly subtherapeutic Increase heparin infusion to 1450 units/hr Recheck heparin level 6 hours after rate change CBC daily while on heparin  Ramonita Burow PharmD, BCPS 02/22/2024 5:00 PM

## 2024-02-22 NOTE — Progress Notes (Signed)
 PROGRESS NOTE    [name]  WGN:[id number] DOB: 11-03-1975 DOA: 02/20/2024 PCP: Dellar Fenton, MD  Chief Complaint  Patient presents with   Shortness of Breath    Hospital Course:  Heather [name] is a 48 year old female with hyperlipidemia, asthma, GERD, depression, anxiety, bipolar disorder, ADHD, MGUS, IBS, seizure disorder, migraine headaches, COVID-19 with long COVID symptoms, who presented to the ED with intermittent chest pain.  Chest pain was becoming progressively worse outpatient with associated shortness of breath.  On arrival to the ED troponins were found to be elevated with steady rise.  Patient was started on heparin drip and cardiology was consulted.  Patient underwent left heart cath on 5/29 which revealed severe one-vessel CAD affecting the ostial left circumflex with mild distal left main and ostial LAD stenosis.  EF was normal by echo.  The location of vascular disease and is reportedly not optimal for PCI and will require bifurcation stent.  Cardiology to discuss at heart meeting on 5/30 to discuss treatment options.  Subjective: This morning patient is fatigued.  Reports her current chest pain is 4 out of 10.  She is anxious to hear results of heart team meeting.  She reports that she will not be comfortable with medical management and would be interested in second opinion if that is the recommendation.  She also endorses request to transfer to [facility] if higher level of care is required.   Objective: Vitals:   02/22/24 1239 02/22/24 1249 02/22/24 1256 02/22/24 1312  BP: (!) 140/86 132/88 131/85 (!) 141/89  Pulse: 75 75 78 80  Resp: 18  18   Temp:      TempSrc:      SpO2:      Weight:      Height:        Intake/Output Summary (Last 24 hours) at 02/22/2024 1337 Last data filed at 02/22/2024 1220 Gross per 24 hour  Intake 586.54 ml  Output 800 ml  Net -213.46 ml   Filed Weights   02/20/24 2137 02/21/24 0206  Weight: 83.9 kg 83.6 kg     Examination: General exam: Appears calm and comfortable, no acute distress Respiratory system: No work of breathing, symmetric chest wall expansion Cardiovascular system: S1 & S2 heard, RRR.  Gastrointestinal system: Abdomen is nondistended, soft and nontender.  Neuro: Alert and oriented. No focal neurological deficits. Extremities: Symmetric, expected ROM Skin: No rashes, lesions Psychiatry: Demonstrates appropriate judgement and insight. Mood & affect appropriate for situation.   Assessment & Plan:  Principal Problem:   NSTEMI (non-ST elevated myocardial infarction) (HCC) Active Problems:   Asthma   Seizure disorder (HCC)   Hypercholesterolemia   MGUS (monoclonal gammopathy of unknown significance)   Leukocytosis   Anxiety   ADHD   Bipolar disorder (HCC)   Overweight (BMI 25.0-29.9)   Migraine    NSTEMI - [facility] 5/29: Severe one-vessel CAD 90% in the ostial left circumflex, not optimal for PCI will require bifurcation stent. - Pending cardiology decisions per results of heart team meeting today - For now is on heparin drip - Continue aspirin and high intensity statin - Echo revealing preserved LVEF - Continues to have chest discomfort though improved from prior.  On nitro infusion, continue - Patient has requested transfer to [facility] of higher level of care as recommended per cardiology team - Follow electrolytes closely, maintain normal levels  Hyperlipidemia - Continue statin 80 mg daily  Asthma History of long COVID - No wheezing on exam, does not appear  to be in acute exacerbation - Continue with as needed bronchodilators, home inhalers - As needed Mucinex   Seizure disorder - Resume home dose Depakote - Seizure precautions - Ativan if needed  MGUS - Follows with hematology, Dr. Wilhelmenia Harada - Continue current therapies  Leukocytosis, chronic - WBC baseline around 13 - Likely secondary to MGUS as above - Trend CBC - No fever or signs or symptoms of active  infection  Anxiety ADHD Bipolar disorder - Holding home Adderall at this time given NSTEMI.  Patient agrees with plan - Patient requests to hold on Caplyta for now. - Continue as needed Valium - UDS appropriately positive for benzodiazepines  Overweight BMI 29 - Outpatient follow up for lifestyle modification and risk factor management  Migraine headache - Continue home meds  DVT prophylaxis: Heparin GTT   Code Status: Full Code Disposition:  Inpatient, pending cardiology planning  Consultants:  Treatment Team:  Consulting Physician: Wenona Hamilton, MD  Procedures:  [facility] 5/29  Antimicrobials:  Anti-infectives (From admission, onward)    None       Data Reviewed: I have personally reviewed following labs and imaging studies CBC: Recent Labs  Lab 02/20/24 2139 02/21/24 0310 02/22/24 0143  WBC 13.4* 13.2* 12.2*  NEUTROABS  --   --  8.2*  HGB 12.9 12.1 12.9  HCT 38.6 36.5 37.3  MCV 83.5 83.9 82.3  PLT 319 301 309   Basic Metabolic Panel: Recent Labs  Lab 02/20/24 2139 02/21/24 0310 02/22/24 0143  NA 137 138 139  K 4.1 3.6 3.9  CL 106 106 106  CO2 21* 25 24  GLUCOSE 98 90 78  BUN 9 10 8   CREATININE 0.85 0.82 0.74  CALCIUM 9.0 8.8* 8.7*  MG  --   --  2.2  PHOS  --   --  3.5   GFR: Estimated Creatinine Clearance: 93.7 mL/min (by C-G formula based on SCr of 0.74 mg/dL). Liver Function Tests: Recent Labs  Lab 02/22/24 0143  AST 48*  ALT 40  ALKPHOS 123  BILITOT 0.7  PROT 6.1*  ALBUMIN 3.4*   CBG: No results for input(s): "GLUCAP" in the last 168 hours.  Recent Results (from the past 240 hours)  Resp panel by RT-PCR (RSV, Flu A&B, Covid) Anterior Nasal Swab     Status: None   Collection Time: 02/20/24 11:29 PM   Specimen: Anterior Nasal Swab  Result Value Ref Range Status   SARS Coronavirus 2 by RT PCR NEGATIVE NEGATIVE Final    Comment: (NOTE) SARS-CoV-2 target nucleic acids are NOT DETECTED.  The SARS-CoV-2 RNA is generally  detectable in upper respiratory specimens during the acute phase of infection. The lowest concentration of SARS-CoV-2 viral copies this assay can detect is 138 copies/mL. A negative result does not preclude SARS-Cov-2 infection and should not be used as the sole basis for treatment or other patient management decisions. A negative result may occur with  improper specimen collection/handling, submission of specimen other than nasopharyngeal swab, presence of viral mutation(s) within the areas targeted by this assay, and inadequate number of viral copies(<138 copies/mL). A negative result must be combined with clinical observations, patient history, and epidemiological information. The expected result is Negative.  Fact Sheet for Patients:  BloggerCourse.com  Fact Sheet for Healthcare Providers:  SeriousBroker.it  This test is no t yet approved or cleared by the [place]  FDA and  has been authorized for detection and/or diagnosis of SARS-CoV-2 by FDA under an Emergency Use Authorization (EUA). This EUA  will remain  in effect (meaning this test can be used) for the duration of the COVID-19 declaration under Section 564(b)(1) of the Act, 21 U.S.C.section 360bbb-3(b)(1), unless the authorization is terminated  or revoked sooner.       Influenza A by PCR NEGATIVE NEGATIVE Final   Influenza B by PCR NEGATIVE NEGATIVE Final    Comment: (NOTE) The Xpert Xpress SARS-CoV-2/FLU/RSV plus assay is intended as an aid in the diagnosis of influenza from Nasopharyngeal swab specimens and should not be used as a sole basis for treatment. Nasal washings and aspirates are unacceptable for Xpert Xpress SARS-CoV-2/FLU/RSV testing.  Fact Sheet for Patients: BloggerCourse.com  Fact Sheet for Healthcare Providers: SeriousBroker.it  This test is not yet approved or cleared by the [place]  FDA  and has been authorized for detection and/or diagnosis of SARS-CoV-2 by FDA under an Emergency Use Authorization (EUA). This EUA will remain in effect (meaning this test can be used) for the duration of the COVID-19 declaration under Section 564(b)(1) of the Act, 21 U.S.C. section 360bbb-3(b)(1), unless the authorization is terminated or revoked.     Resp Syncytial Virus by PCR NEGATIVE NEGATIVE Final    Comment: (NOTE) Fact Sheet for Patients: BloggerCourse.com  Fact Sheet for Healthcare Providers: SeriousBroker.it  This test is not yet approved or cleared by the [place]  FDA and has been authorized for detection and/or diagnosis of SARS-CoV-2 by FDA under an Emergency Use Authorization (EUA). This EUA will remain in effect (meaning this test can be used) for the duration of the COVID-19 declaration under Section 564(b)(1) of the Act, 21 U.S.C. section 360bbb-3(b)(1), unless the authorization is terminated or revoked.  Performed at [facility], [address] [place], [place] [address]      Radiology Studies: CARDIAC CATHETERIZATION Addendum Date: 02/22/2024   Dist LM to [facility] LAD lesion is 30% stenosed.   Ost Cx to Prox Cx lesion is 90% stenosed.   Prox RCA lesion is 20% stenosed.   RPAV lesion is 20% stenosed.   LPAV lesion is 60% stenosed. 1.  Severe one-vessel coronary artery disease affecting the ostial left circumflex.  There is mild distal left main and ostial LAD stenosis. 2.  Left ventricular angiography was not performed.  EF was normal by echo. 3.  Moderately elevated left ventricular end-diastolic pressure at 22 mmHg. Recommendations: The ostial left circumflex stenosis is not optimal for PCI and likely requires bifurcation stenting in order to preserve the LAD.  Will discuss tomorrow during the heart team meeting. Resume heparin  drip 2 hours after TR band removal.  Result Date: 02/22/2024   Dist LM to Ost  LAD lesion is 30% stenosed.   Ost Cx to Prox Cx lesion is 90% stenosed.   Prox RCA lesion is 20% stenosed.   RPAV lesion is 20% stenosed. 1.  Severe one-vessel coronary artery disease affecting the ostial left circumflex.  There is mild distal left main and ostial LAD stenosis. 2.  Left ventricular angiography was not performed.  EF was normal by echo. 3.  Moderately elevated left ventricular end-diastolic pressure at 22 mmHg. Recommendations: The ostial left circumflex stenosis is not optimal for PCI and likely requires bifurcation stenting in order to preserve the LAD.  Will discuss tomorrow during the heart team meeting. Resume heparin  drip 2 hours after TR band removal.   ECHOCARDIOGRAM COMPLETE Result Date: 02/21/2024    ECHOCARDIOGRAM REPORT   Patient Name:   Heather [name] [facility] Date of Exam: 02/21/2024 Medical Rec #:  [id number]      Height:       66.0 in Accession #:    [id number]     Weight:       184.3 lb Date of Birth:  [birth year]      BSA:          1.932 [name] Patient Age:    47 years       BP:           127/86 mmHg Patient Gender: F              HR:           75 bpm. Exam Location:  [facility] Procedure: 2D Echo, Cardiac Doppler, Color Doppler, Strain Analysis and 3D Echo            (Both Spectral and Color Flow Doppler were utilized during            procedure). Indications:     Chest pain R07.9  History:         Patient has no prior history of Echocardiogram examinations.                  History of cardiac arrhythmia and migraine.  Sonographer:     Broadus Canes Referring Phys:  [id number] XILIN NIU Diagnosing Phys: Constancia Delton MD  Sonographer Comments: Global longitudinal strain was attempted. IMPRESSIONS  1. Left ventricular ejection fraction, by estimation, is 55 to 60%. The left ventricle has normal function. The left ventricle has no regional wall motion abnormalities. Left ventricular diastolic parameters were normal.  2. Right ventricular systolic function is normal. The right ventricular size is normal.  3.  The mitral valve is normal in structure. Mild mitral valve regurgitation.  4. The aortic valve is tricuspid. Aortic valve regurgitation is not visualized. FINDINGS  Left Ventricle: Left ventricular ejection fraction, by estimation, is 55 to 60%. The left ventricle has normal function. The left ventricle has no regional wall motion abnormalities. Global longitudinal strain performed but not reported based on interpreter judgement due to suboptimal tracking. The left ventricular internal cavity size was normal in size. There is no left ventricular hypertrophy. Left ventricular diastolic parameters were normal. Right Ventricle: The right ventricular size is normal. No increase in right ventricular wall thickness. Right ventricular systolic function is normal. Left Atrium: Left atrial size was normal in size. Right Atrium: Right atrial size was normal in size. Pericardium: There is no evidence of pericardial effusion. Mitral Valve: The mitral valve is normal in structure. Mild mitral valve regurgitation. Tricuspid Valve: The tricuspid valve is normal in structure. Tricuspid valve regurgitation is not demonstrated. Aortic Valve: The aortic valve is tricuspid. Aortic valve regurgitation is not visualized. Aortic valve mean gradient measures 2.0 mmHg. Aortic valve peak gradient measures 3.2 mmHg. Aortic valve area, by VTI measures 2.79 cm. Pulmonic Valve: The pulmonic valve was not well visualized. Pulmonic valve regurgitation is not visualized. Aorta: The aortic root is normal in size and structure. Venous: The inferior vena cava was not well visualized. IAS/Shunts: No atrial level shunt detected by color flow Doppler.  LEFT VENTRICLE PLAX 2D LVIDd:         4.90 cm     Diastology LVIDs:         3.60 cm     LV e' medial:    8.81 cm/s LV PW:         1.00 cm     LV E/e' medial:  10.6 LV IVS:  1.20 cm     LV e' lateral:   7.18 cm/s LVOT diam:     2.00 cm     LV E/e' lateral: 13.0 LV SV:         47 LV SV Index:   25  LVOT Area:     3.14 cm  LV Volumes (MOD) LV vol d, MOD A2C: 80.1 ml LV vol d, MOD A4C: 91.7 ml LV vol s, MOD A2C: 50.7 ml LV vol s, MOD A4C: 55.3 ml LV SV MOD A2C:     29.4 ml LV SV MOD A4C:     91.7 ml LV SV MOD BP:      32.7 ml RIGHT VENTRICLE RV Basal diam:  2.40 cm RV Mid diam:    2.00 cm RV S prime:     8.49 cm/s TAPSE ([name]-mode): 2.0 cm LEFT ATRIUM             Index        RIGHT ATRIUM          Index LA diam:        3.10 cm 1.60 cm/[name]   RA Area:     7.76 cm LA Vol (A2C):   32.3 ml 16.72 ml/[name]  RA Volume:   13.40 ml 6.94 ml/[name] LA Vol (A4C):   18.5 ml 9.58 ml/[name] LA Biplane Vol: 25.1 ml 12.99 ml/[name]  AORTIC VALVE AV Area (Vmax):    2.56 cm AV Area (Vmean):   2.53 cm AV Area (VTI):     2.79 cm AV Vmax:           89.90 cm/s AV Vmean:          61.300 cm/s AV VTI:            0.170 [name] AV Peak Grad:      3.2 mmHg AV Mean Grad:      2.0 mmHg LVOT Vmax:         73.40 cm/s LVOT Vmean:        49.400 cm/s LVOT VTI:          0.151 [name] LVOT/AV VTI ratio: 0.89  AORTA Ao Root diam: 2.60 cm MITRAL VALVE               TRICUSPID VALVE MV Area (PHT): 5.27 cm    TR Peak grad:   14.6 mmHg MV Decel Time: 144 msec    TR Vmax:        191.00 cm/s MV E velocity: 93.30 cm/s MV A velocity: 84.40 cm/s  SHUNTS MV E/A ratio:  1.11        Systemic VTI:  0.15 [name]                            Systemic Diam: 2.00 cm Constancia Delton MD Electronically signed by Constancia Delton MD Signature Date/Time: 02/21/2024/12:57:40 PM    Final    CT Angio Chest PE W/Cm &/Or Wo Cm Result Date: 02/21/2024 CLINICAL DATA:  Shortness of breath and chest pain intermittently for approximately 1 month. Worse today. Cough. EXAM: CT ANGIOGRAPHY CHEST WITH CONTRAST TECHNIQUE: Multidetector CT imaging of the chest was performed using the standard protocol during bolus administration of intravenous contrast. Multiplanar CT image reconstructions and MIPs were obtained to evaluate the vascular anatomy. RADIATION DOSE REDUCTION: This exam was performed according to the  departmental dose-optimization program which includes automated exposure control, adjustment of the mA and/or kV according to patient  size and/or use of iterative reconstruction technique. CONTRAST:  75mL OMNIPAQUE IOHEXOL 350 MG/ML SOLN COMPARISON:  Radiograph earlier today FINDINGS: Cardiovascular: Negative for acute pulmonary embolism. Normal caliber thoracic aorta. No pericardial effusion. Mediastinum/Nodes: Trachea and esophagus are unremarkable. No thoracic adenopathy Lungs/Pleura: Scattered bandlike opacities compatible with atelectasis. Otherwise no focal consolidation, pleural effusion, or pneumothorax. Mosaic attenuation of the lungs compatible with air trapping. Upper Abdomen: No acute abnormality. Musculoskeletal: No acute fracture. Review of the MIP images confirms the above findings. IMPRESSION: 1. Negative for acute pulmonary embolism. 2. Mosaic attenuation of the lungs compatible with air trapping. This can be seen with small airways infection/inflammation. Electronically Signed   By: Rozell Cornet [name].D.   On: 02/21/2024 00:00   DG Chest 2 View Result Date: 02/20/2024 CLINICAL DATA:  Chest pain and shortness of breath. EXAM: CHEST - 2 VIEW COMPARISON:  July 30, 2023 FINDINGS: The heart size and mediastinal contours are within normal limits. Mild linear scarring and/or atelectasis is seen within the retrocardiac region of the left lung base. There is no evidence of acute infiltrate, pleural effusion or pneumothorax. The visualized skeletal structures are unremarkable. IMPRESSION: No active cardiopulmonary disease. Electronically Signed   By: Virgle Grime [name].D.   On: 02/20/2024 21:59    Scheduled Meds:  aspirin EC  81 mg Oral Daily   atorvastatin  80 mg Oral Daily   divalproex  1,500 mg Oral QHS   feeding supplement  237 mL Oral BID BM   fluticasone   1 spray Each Nare Daily   fluticasone  furoate-vilanterol  1 puff Inhalation Daily   loratadine  10 mg Oral Daily   montelukast   10  mg Oral QHS   pantoprazole  40 mg Oral Daily   sodium chloride  flush  3 mL Intravenous Q12H   Continuous Infusions:  sodium chloride      heparin 1,350 Units/hr (02/22/24 1213)   nitroGLYCERIN 10 mcg/min (02/22/24 1240)     LOS: 1 day  MDM: Patient is high risk for one or more organ failure.  They necessitate ongoing hospitalization for continued IV therapies and subsequent lab monitoring. Total time spent interpreting labs and vitals, reviewing the medical record, coordinating care amongst consultants and care team members, directly assessing and discussing care with the patient and/or family: 55 min  Christan Ciccarelli, DO [facility]  To contact the attending physician between 7A-7P please use Epic Chat. To contact the covering physician during after hours 7P-7A, please review Amion.  02/22/2024, 1:37 PM   *This document has been created with the assistance of dictation software. Please excuse typographical errors. *

## 2024-02-22 NOTE — Discharge Summary (Signed)
 Physician Discharge Summary   Patient: Heather [name] MRN: [id number] DOB: 10-24-1975  Admit date:     02/20/2024  Discharge date: 02/22/24  Discharge Physician: Roise Cleaver   PCP: Dellar Fenton, MD   Recommendations at discharge:    Discharging directly to [facility] for Cardiology services  Discharge Diagnoses: Principal Problem:   NSTEMI (non-ST elevated myocardial infarction) [facility]) Active Problems:   Asthma   Seizure disorder (HCC)   Hypercholesterolemia   MGUS (monoclonal gammopathy of unknown significance)   Leukocytosis   Anxiety   ADHD   Bipolar disorder (HCC)   Overweight (BMI 25.0-29.9)   Migraine  Resolved Problems:   * No resolved hospital problems. 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  Chest pain was becoming progressively worse outpatient with associated shortness of breath.  On arrival to the ED troponins were found to be elevated with steady rise.  Patient was started on heparin drip and cardiology was consulted.  Patient underwent left heart cath on 5/29 which revealed severe one-vessel CAD affecting the ostial left circumflex with mild distal left main and ostial LAD stenosis.  EF was normal by echo.  The location of vascular disease and is reportedly not optimal for PCI and will require bifurcation stent. Cardiology discussed this at Heart meeting and is recommending angioplasty. Patient requested transfer to [facility] for cardiology care. Dr. Alvenia Aus spoke directly with [facility] cardiologist and Accepting physician is Dr. Ronny Colas.  She was accepted for transfer to [facility].  For now we will continue with heparin drip and nitroglycerin drip, further management regarding decision on PCI vs CABG per [facility] cardiology team.   NSTEMI - [facility]  5/29: Severe one-vessel CAD 90% in the ostial left circumflex, not optimal for PCI will require bifurcation stent. - Cardiology has recommended angioplasty if unable to control symptoms with medical management. -- Patient has requested transfer to [facility]. - For now is on heparin drip - Continue aspirin and high intensity statin - Echo revealing preserved LVEF - Continues to have chest discomfort though improved from prior.  On nitro infusion, continue - Follow electrolytes closely, maintain normal levels   Hyperlipidemia - Continue statin 80 mg daily   Asthma History of long COVID - No wheezing on exam, does not appear to be in acute exacerbation - Continue with as needed bronchodilators, home inhalers - As needed Mucinex    Seizure disorder - Resume home dose Depakote - Seizure precautions - Ativan if needed   MGUS - Follows with hematology, Dr. Wilhelmenia Harada - Continue current therapies   Leukocytosis, chronic - WBC baseline around 13 - Likely secondary to MGUS as above - Trend CBC - No fever or signs or symptoms of active infection   Anxiety ADHD Bipolar disorder - Holding home Adderall at this time given NSTEMI.  Patient agrees with plan - Patient requests to hold on Caplyta for now. - Continue as needed Valium - UDS appropriately positive for benzodiazepines   Overweight BMI 29 - Outpatient follow up for lifestyle modification and risk factor management   Migraine headache - Continue home meds       Consultants: Cardiology Procedures performed: [facility] 5/29  Disposition: [facility] Diet recommendation:  Discharge Diet Orders (From admission, onward)     Start     Ordered   02/22/24 0000  Diet general  02/22/24 1603           Cardiac diet DISCHARGE MEDICATION: Allergies as of 02/22/2024       Reactions   Levaquin  [levofloxacin ] Swelling   Tramadol Other (See Comments)   Seizures   Amoxicillin Nausea And Vomiting   Augmentin [amoxicillin-pot Clavulanate]  Other (See Comments)   Erythromycin  Nausea And Vomiting   Lithium Nausea Only        Medication List     PAUSE taking these medications    amphetamine-dextroamphetamine 20 MG 24 hr capsule Wait to take this until your doctor or other care provider tells you to start again. Commonly known as: ADDERALL XR Take 20 mg by mouth every morning.   Caplyta 42 MG capsule Wait to take this until your doctor or other care provider tells you to start again. Generic drug: lumateperone tosylate Take 42 mg by mouth at bedtime.       STOP taking these medications    polyethylene glycol 17 g packet Commonly known as: MIRALAX / GLYCOLAX   Ubrelvy 100 MG Tabs Generic drug: Ubrogepant       TAKE these medications    Airsupra  90-80 MCG/ACT Aero Generic drug: Albuterol -Budesonide  INHALE 2 PUFFS INTO THE LUNGS EVERY 6 HOURS   Ajovy 225 MG/1.5ML Soaj Generic drug: Fremanezumab-vfrm Inject 225 mg into the skin every 30 (thirty) days.   albuterol  108 (90 Base) MCG/ACT inhaler Commonly known as: VENTOLIN  HFA INHALE 1 TO 2 PUFFS BY MOUTH EVERY 4 HOURS AS NEEDED FOR WHEEZING FOR SHORTNESS OF BREATH   ascorbic acid 1000 MG tablet Commonly known as: VITAMIN C Take 500 mg by mouth daily.   aspirin EC 81 MG tablet Take 1 tablet (81 mg total) by mouth daily. Swallow whole. Start taking on: Feb 23, 2024   atorvastatin 80 MG tablet Commonly known as: LIPITOR Take 1 tablet (80 mg total) by mouth daily. Start taking on: Feb 23, 2024   benzonatate  100 MG capsule Commonly known as: TESSALON  Take 1 capsule (100 mg total) by mouth every 8 (eight) hours. What changed:  when to take this reasons to take this   cetirizine  10 MG tablet Commonly known as: ZYRTEC  Take 1 tablet by mouth once daily   cyanocobalamin  1000 MCG/ML injection Commonly known as: VITAMIN B12 Inject 1,000 mcg into the muscle every 30 (thirty) days.   diazepam 10 MG tablet Commonly known as: VALIUM Take 1 tablet  by mouth 4 (four) times daily as needed.   divalproex 500 MG 24 hr tablet Commonly known as: DEPAKOTE ER Take 1,500 mg by mouth at bedtime.   Dupixent 300 MG/2ML Soaj Generic drug: Dupilumab Inject 300 mg into the skin every 14 (fourteen) days. **loading dose completed in clinic on 01/04/2024**   fluticasone  50 MCG/ACT nasal spray Commonly known as: FLONASE  Place 1 spray into both nostrils daily.   fluticasone -salmeterol 500-50 MCG/ACT Aepb Commonly known as: ADVAIR Inhale 1 puff into the lungs in the morning and at bedtime.   guaiFENesin  600 MG 12 hr tablet Commonly known as: Mucinex  Take 2 tablets (1,200 mg total) by mouth 2 (two) times daily as needed.   heparin 25000 UT/250ML infusion Inject 1,350 Units/hr into the vein continuous.   ipratropium 0.03 % nasal spray Commonly known as: ATROVENT  Place 2 sprays into both nostrils every 12 (twelve) hours.   ipratropium-albuterol  0.5-2.5 (3) MG/3ML Soln Commonly known as: DUONEB Inhale 3 mLs into the lungs every 4 (four) hours as needed.   linaclotide  290 MCG Caps  capsule Commonly known as: Linzess  Take 1 capsule (290 mcg total) by mouth daily before breakfast. What changed:  when to take this reasons to take this   montelukast  10 MG tablet Commonly known as: SINGULAIR  Take 1 tablet (10 mg total) by mouth at bedtime.   nitroGLYCERIN 0.2 mg/mL infusion Inject 0-200 mcg/min into the vein continuous.   Nurtec 75 MG Tbdp Generic drug: Rimegepant Sulfate Take 75 mg by mouth daily as needed.   omeprazole  40 MG capsule Commonly known as: PRILOSEC Take 1 capsule (40 mg total) by mouth daily before breakfast.   ondansetron  4 MG disintegrating tablet Commonly known as: ZOFRAN -ODT DISSOLVE 1 TABLET IN MOUTH TWICE DAILY AS NEEDED FOR NAUSEA AND  VOMITING        Discharge Exam: Filed Weights   02/20/24 2137 02/21/24 0206  Weight: 83.9 kg 83.6 kg   See progress note same date  Condition at discharge: stable  The  results of significant diagnostics from this hospitalization (including imaging, microbiology, ancillary and laboratory) are listed below for reference.   Imaging Studies: CARDIAC CATHETERIZATION Addendum Date: 02/22/2024   Dist LM to Ost LAD lesion is 30% stenosed.   Ost Cx to Prox Cx lesion is 90% stenosed.   Prox RCA lesion is 20% stenosed.   RPAV lesion is 20% stenosed.   LPAV lesion is 60% stenosed. 1.  Severe one-vessel coronary artery disease affecting the ostial left circumflex.  There is mild distal left main and ostial LAD stenosis. 2.  Left ventricular angiography was not performed.  EF was normal by echo. 3.  Moderately elevated left ventricular end-diastolic pressure at 22 mmHg. Recommendations: The ostial left circumflex stenosis is not optimal for PCI and likely requires bifurcation stenting in order to preserve the LAD.  Will discuss tomorrow during the heart team meeting. Resume heparin drip 2 hours after TR band removal.  Result Date: 02/22/2024   Dist LM to Ost LAD lesion is 30% stenosed.   Ost Cx to Prox Cx lesion is 90% stenosed.   Prox RCA lesion is 20% stenosed.   RPAV lesion is 20% stenosed. 1.  Severe one-vessel coronary artery disease affecting the ostial left circumflex.  There is mild distal left main and ostial LAD stenosis. 2.  Left ventricular angiography was not performed.  EF was normal by echo. 3.  Moderately elevated left ventricular end-diastolic pressure at 22 mmHg. Recommendations: The ostial left circumflex stenosis is not optimal for PCI and likely requires bifurcation stenting in order to preserve the LAD.  Will discuss tomorrow during the heart team meeting. Resume heparin drip 2 hours after TR band removal.   ECHOCARDIOGRAM COMPLETE Result Date: 02/21/2024    ECHOCARDIOGRAM REPORT   Patient Name:   DEJIA EBRON [facility] Date of Exam: 02/21/2024 Medical Rec #:  [id number]      Height:       66.0 in Accession #:    [id number]     Weight:       184.3 lb Date of Birth:   11/05/1975      BSA:          1.932 m Patient Age:    47 years       BP:           127/86 mmHg Patient Gender: F              HR:           75 bpm. Exam Location:  [facility] Procedure: 2D Echo, Cardiac Doppler,  Color Doppler, Strain Analysis and 3D Echo            (Both Spectral and Color Flow Doppler were utilized during            procedure). Indications:     Chest pain R07.9  History:         Patient has no prior history of Echocardiogram examinations.                  History of cardiac arrhythmia and migraine.  Sonographer:     Broadus Canes Referring Phys:  [id number] XILIN NIU Diagnosing Phys: Constancia Delton MD  Sonographer Comments: Global longitudinal strain was attempted. IMPRESSIONS  1. Left ventricular ejection fraction, by estimation, is 55 to 60%. The left ventricle has normal function. The left ventricle has no regional wall motion abnormalities. Left ventricular diastolic parameters were normal.  2. Right ventricular systolic function is normal. The right ventricular size is normal.  3. The mitral valve is normal in structure. Mild mitral valve regurgitation.  4. The aortic valve is tricuspid. Aortic valve regurgitation is not visualized. FINDINGS  Left Ventricle: Left ventricular ejection fraction, by estimation, is 55 to 60%. The left ventricle has normal function. The left ventricle has no regional wall motion abnormalities. Global longitudinal strain performed but not reported based on interpreter judgement due to suboptimal tracking. The left ventricular internal cavity size was normal in size. There is no left ventricular hypertrophy. Left ventricular diastolic parameters were normal. Right Ventricle: The right ventricular size is normal. No increase in right ventricular wall thickness. Right ventricular systolic function is normal. Left Atrium: Left atrial size was normal in size. Right Atrium: Right atrial size was normal in size. Pericardium: There is no evidence of pericardial effusion. Mitral Valve:  The mitral valve is normal in structure. Mild mitral valve regurgitation. Tricuspid Valve: The tricuspid valve is normal in structure. Tricuspid valve regurgitation is not demonstrated. Aortic Valve: The aortic valve is tricuspid. Aortic valve regurgitation is not visualized. Aortic valve mean gradient measures 2.0 mmHg. Aortic valve peak gradient measures 3.2 mmHg. Aortic valve area, by VTI measures 2.79 cm. Pulmonic Valve: The pulmonic valve was not well visualized. Pulmonic valve regurgitation is not visualized. Aorta: The aortic root is normal in size and structure. Venous: The inferior vena cava was not well visualized. IAS/Shunts: No atrial level shunt detected by color flow Doppler.  LEFT VENTRICLE PLAX 2D LVIDd:         4.90 cm     Diastology LVIDs:         3.60 cm     LV e' medial:    8.81 cm/s LV PW:         1.00 cm     LV E/e' medial:  10.6 LV IVS:        1.20 cm     LV e' lateral:   7.18 cm/s LVOT diam:     2.00 cm     LV E/e' lateral: 13.0 LV SV:         47 LV SV Index:   25 LVOT Area:     3.14 cm  LV Volumes (MOD) LV vol d, MOD A2C: 80.1 ml LV vol d, MOD A4C: 91.7 ml LV vol s, MOD A2C: 50.7 ml LV vol s, MOD A4C: 55.3 ml LV SV MOD A2C:     29.4 ml LV SV MOD A4C:     91.7 ml LV SV MOD BP:      32.7  ml RIGHT VENTRICLE RV Basal diam:  2.40 cm RV Mid diam:    2.00 cm RV S prime:     8.49 cm/s TAPSE (M-mode): 2.0 cm LEFT ATRIUM             Index        RIGHT ATRIUM          Index LA diam:        3.10 cm 1.60 cm/m   RA Area:     7.76 cm LA Vol (A2C):   32.3 ml 16.72 ml/m  RA Volume:   13.40 ml 6.94 ml/m LA Vol (A4C):   18.5 ml 9.58 ml/m LA Biplane Vol: 25.1 ml 12.99 ml/m  AORTIC VALVE AV Area (Vmax):    2.56 cm AV Area (Vmean):   2.53 cm AV Area (VTI):     2.79 cm AV Vmax:           89.90 cm/s AV Vmean:          61.300 cm/s AV VTI:            0.170 m AV Peak Grad:      3.2 mmHg AV Mean Grad:      2.0 mmHg LVOT Vmax:         73.40 cm/s LVOT Vmean:        49.400 cm/s LVOT VTI:          0.151 m  LVOT/AV VTI ratio: 0.89  AORTA Ao Root diam: 2.60 cm MITRAL VALVE               TRICUSPID VALVE MV Area (PHT): 5.27 cm    TR Peak grad:   14.6 mmHg MV Decel Time: 144 msec    TR Vmax:        191.00 cm/s MV E velocity: 93.30 cm/s MV A velocity: 84.40 cm/s  SHUNTS MV E/A ratio:  1.11        Systemic VTI:  0.15 m                            Systemic Diam: 2.00 cm Constancia Delton MD Electronically signed by Constancia Delton MD Signature Date/Time: 02/21/2024/12:57:40 PM    Final    CT Angio Chest PE W/Cm &/Or Wo Cm Result Date: 02/21/2024 CLINICAL DATA:  Shortness of breath and chest pain intermittently for approximately 1 month. Worse today. Cough. EXAM: CT ANGIOGRAPHY CHEST WITH CONTRAST TECHNIQUE: Multidetector CT imaging of the chest was performed using the standard protocol during bolus administration of intravenous contrast. Multiplanar CT image reconstructions and MIPs were obtained to evaluate the vascular anatomy. RADIATION DOSE REDUCTION: This exam was performed according to the departmental dose-optimization program which includes automated exposure control, adjustment of the mA and/or kV according to patient size and/or use of iterative reconstruction technique. CONTRAST:  75mL OMNIPAQUE IOHEXOL 350 MG/ML SOLN COMPARISON:  Radiograph earlier today FINDINGS: Cardiovascular: Negative for acute pulmonary embolism. Normal caliber thoracic aorta. No pericardial effusion. Mediastinum/Nodes: Trachea and esophagus are unremarkable. No thoracic adenopathy Lungs/Pleura: Scattered bandlike opacities compatible with atelectasis. Otherwise no focal consolidation, pleural effusion, or pneumothorax. Mosaic attenuation of the lungs compatible with air trapping. Upper Abdomen: No acute abnormality. Musculoskeletal: No acute fracture. Review of the MIP images confirms the above findings. IMPRESSION: 1. Negative for acute pulmonary embolism. 2. Mosaic attenuation of the lungs compatible with air trapping. This can be seen  with small airways infection/inflammation. Electronically Signed   By: Herminia Lope  Stutzman M.D.   On: 02/21/2024 00:00   DG Chest 2 View Result Date: 02/20/2024 CLINICAL DATA:  Chest pain and shortness of breath. EXAM: CHEST - 2 VIEW COMPARISON:  July 30, 2023 FINDINGS: The heart size and mediastinal contours are within normal limits. Mild linear scarring and/or atelectasis is seen within the retrocardiac region of the left lung base. There is no evidence of acute infiltrate, pleural effusion or pneumothorax. The visualized skeletal structures are unremarkable. IMPRESSION: No active cardiopulmonary disease. Electronically Signed   By: Virgle Grime M.D.   On: 02/20/2024 21:59    Microbiology: Results for orders placed or performed during the hospital encounter of 02/20/24  Resp panel by RT-PCR (RSV, Flu A&B, Covid) Anterior Nasal Swab     Status: None   Collection Time: 02/20/24 11:29 PM   Specimen: Anterior Nasal Swab  Result Value Ref Range Status   SARS Coronavirus 2 by RT PCR NEGATIVE NEGATIVE Final    Comment: (NOTE) SARS-CoV-2 target nucleic acids are NOT DETECTED.  The SARS-CoV-2 RNA is generally detectable in upper respiratory specimens during the acute phase of infection. The lowest concentration of SARS-CoV-2 viral copies this assay can detect is 138 copies/mL. A negative result does not preclude SARS-Cov-2 infection and should not be used as the sole basis for treatment or other patient management decisions. A negative result may occur with  improper specimen collection/handling, submission of specimen other than nasopharyngeal swab, presence of viral mutation(s) within the areas targeted by this assay, and inadequate number of viral copies(<138 copies/mL). A negative result must be combined with clinical observations, patient history, and epidemiological information. The expected result is Negative.  Fact Sheet for Patients:   BloggerCourse.com  Fact Sheet for Healthcare Providers:  SeriousBroker.it  This test is no t yet approved or cleared by the [place]  FDA and  has been authorized for detection and/or diagnosis of SARS-CoV-2 by FDA under an Emergency Use Authorization (EUA). This EUA will remain  in effect (meaning this test can be used) for the duration of the COVID-19 declaration under Section 564(b)(1) of the Act, 21 U.S.C.section 360bbb-3(b)(1), unless the authorization is terminated  or revoked sooner.       Influenza A by PCR NEGATIVE NEGATIVE Final   Influenza B by PCR NEGATIVE NEGATIVE Final    Comment: (NOTE) The Xpert Xpress SARS-CoV-2/FLU/RSV plus assay is intended as an aid in the diagnosis of influenza from Nasopharyngeal swab specimens and should not be used as a sole basis for treatment. Nasal washings and aspirates are unacceptable for Xpert Xpress SARS-CoV-2/FLU/RSV testing.  Fact Sheet for Patients: BloggerCourse.com  Fact Sheet for Healthcare Providers: SeriousBroker.it  This test is not yet approved or cleared by the [place]  FDA and has been authorized for detection and/or diagnosis of SARS-CoV-2 by FDA under an Emergency Use Authorization (EUA). This EUA will remain in effect (meaning this test can be used) for the duration of the COVID-19 declaration under Section 564(b)(1) of the Act, 21 U.S.C. section 360bbb-3(b)(1), unless the authorization is terminated or revoked.     Resp Syncytial Virus by PCR NEGATIVE NEGATIVE Final    Comment: (NOTE) Fact Sheet for Patients: BloggerCourse.com  Fact Sheet for Healthcare Providers: SeriousBroker.it  This test is not yet approved or cleared by the [place]  FDA and has been authorized for detection and/or diagnosis of SARS-CoV-2 by FDA under an Emergency Use  Authorization (EUA). This EUA will remain in effect (meaning this test can be used) for the duration of the  COVID-19 declaration under Section 564(b)(1) of the Act, 21 U.S.C. section 360bbb-3(b)(1), unless the authorization is terminated or revoked.  Performed at [facility], [address] [place], [place] [address]     Labs: CBC: Recent Labs  Lab 02/20/24 2139 02/21/24 0310 02/22/24 0143  WBC 13.4* 13.2* 12.2*  NEUTROABS  --   --  8.2*  HGB 12.9 12.1 12.9  HCT 38.6 36.5 37.3  MCV 83.5 83.9 82.3  PLT 319 301 309   Basic Metabolic Panel: Recent Labs  Lab 02/20/24 2139 02/21/24 0310 02/22/24 0143  NA 137 138 139  K 4.1 3.6 3.9  CL 106 106 106  CO2 21* 25 24  GLUCOSE 98 90 78  BUN 9 10 8   CREATININE 0.85 0.82 0.74  CALCIUM 9.0 8.8* 8.7*  MG  --   --  2.2  PHOS  --   --  3.5   Liver Function Tests: Recent Labs  Lab 02/22/24 0143  AST 48*  ALT 40  ALKPHOS 123  BILITOT 0.7  PROT 6.1*  ALBUMIN 3.4*   CBG: No results for input(s): "GLUCAP" in the last 168 hours.  Discharge time spent: 32 minutes.  Signed: Blakeley Margraf, DO [facility] 02/22/2024

## 2024-02-24 LAB — LIPOPROTEIN A (LPA): Lipoprotein (a): 43.8 nmol/L — ABNORMAL HIGH (ref <75.0)

## 2024-02-25 ENCOUNTER — Ambulatory Visit: Payer: Self-pay | Admitting: Emergency Medicine

## 2024-02-25 NOTE — Heart Team MDD (Signed)
   Heart Team Multi-Disciplinary Discussion  Patient: Heather [name]  DOB: January 17, 1976  MRN: [id number]   Date: 02/25/2024  11:10 AM    Attendees: Interventional Cardiology: Antionette Kirks, MD Maisie Scotland, MD Antoinette Batman, MD Peter Swazil[place], MD Knox Perl, MD Fransico Ivy, MD Arnoldo Lapping, MD Alyssa Backbone, MD Burney Carter, MD  Cardiothoracic Surgery: Starleen Eastern, MD   Additional Attendees: Dr. Lavonne Prairie   Patient History: Heart team discussion from 02/22/2024: 48 year old female with intermittent chest pain over the past several days. Chest pain was becoming progressively worse outpatient with associated shortness of breath. On arrival to the ED troponins were found to be elevated with steady rise. Patient was started on heparin  drip.      Risk Factors: Hyperlipidemia Additional Risk Factors: asthma, GERD, depression, anxiety, bipolar disorder, ADHD, IBS, seizure disorder, migraine headaches.      Review of Prior Angiography and PCI Procedures: The left heart cath and coronary angiography images from 02/21/2024 were reviewed and discussed in detail including: severe one-vessel CAD affecting the ostial left circumflex with mild distal left main and ostial LAD stenosis.   Discussion: After presentation, consideration of treatment options occurred including contrasting PCI attempt versus referral to CTS for possible CABG. The team felt that CABG would be limited by the small vessel diameter and lack of obstructive disease in the LAD and right coronary artery.Consensus was for a PCI attempt using a drug coated balloon without stent placement to left Cx if medical therapy is not effective at managing the patient's symptoms with shared decision making with the patient as the foundation. Additionally, it was felt that Repatha should be used in the outpatient setting to manage cholesterol better.    Recommendations: PCI Medical Therapy      Andreas Kays, RN  02/25/2024 11:10 AM

## 2024-02-25 NOTE — Telephone Encounter (Signed)
 Paperwork has due date of 6/17. Pt is currently admitted to hospital. Ok to hold for completion until discharge?

## 2024-02-25 NOTE — Telephone Encounter (Signed)
 I am ok, if she is covered until then. I am not sure what her work is requiring. Please confirm.

## 2024-02-26 ENCOUNTER — Encounter: Payer: Self-pay | Admitting: Pulmonary Disease

## 2024-02-26 ENCOUNTER — Telehealth: Payer: Self-pay

## 2024-02-26 NOTE — Transitions of Care (Post Inpatient/ED Visit) (Signed)
   02/26/2024  Name: Heather [name] MRN: [id number] DOB: 1976-05-13  Today's TOC FU Call Status: Today's TOC FU Call Status:: Unsuccessful Call (1st Attempt) Unsuccessful Call (1st Attempt) Date: 02/26/24  Attempted to reach the patient regarding the most recent Inpatient/ED visit.  Follow Up Plan: Additional outreach attempts will be made to reach the patient to complete the Transitions of Care (Post Inpatient/ED visit) call.   Gareld June, BSN, RN   [facility] - [employer] [facility] RN Care Manager [phone]

## 2024-02-26 NOTE — Telephone Encounter (Signed)
 Patient was discharged from the hospital last night and will do HFU 6/5. Holding paperwork for appt.

## 2024-02-27 ENCOUNTER — Telehealth: Payer: Self-pay | Admitting: *Deleted

## 2024-02-27 NOTE — Transitions of Care (Post Inpatient/ED Visit) (Signed)
   02/27/2024  Name: Heather [name] MRN: [id number] DOB: Jul 24, 1976  Today's TOC FU Call Status: Today's TOC FU Call Status:: Unsuccessful Call (2nd Attempt) Unsuccessful Call (2nd Attempt) Date: 02/27/24  Attempted to reach the patient regarding the most recent Inpatient/ED visit.  Follow Up Plan: Additional outreach attempts will be made to reach the patient to complete the Transitions of Care (Post Inpatient/ED visit) call.   Una Ganser BSN RN [facility] Health Care Management Coordinator [email or web address] Direct Dial: [phone]  Fax: [phone] Website: [email or web address]

## 2024-02-28 ENCOUNTER — Ambulatory Visit: Admitting: Internal Medicine

## 2024-02-28 ENCOUNTER — Telehealth: Payer: Self-pay

## 2024-02-28 VITALS — BP 128/76 | HR 80 | Temp 98.0°F | Resp 16 | Ht 66.0 in | Wt 184.2 lb

## 2024-02-28 DIAGNOSIS — J4540 Moderate persistent asthma, uncomplicated: Secondary | ICD-10-CM

## 2024-02-28 DIAGNOSIS — F419 Anxiety disorder, unspecified: Secondary | ICD-10-CM

## 2024-02-28 DIAGNOSIS — G40909 Epilepsy, unspecified, not intractable, without status epilepticus: Secondary | ICD-10-CM

## 2024-02-28 DIAGNOSIS — E7800 Pure hypercholesterolemia, unspecified: Secondary | ICD-10-CM

## 2024-02-28 DIAGNOSIS — I2510 Atherosclerotic heart disease of native coronary artery without angina pectoris: Secondary | ICD-10-CM | POA: Diagnosis not present

## 2024-02-28 DIAGNOSIS — Z0289 Encounter for other administrative examinations: Secondary | ICD-10-CM

## 2024-02-28 DIAGNOSIS — Z8616 Personal history of COVID-19: Secondary | ICD-10-CM

## 2024-02-28 DIAGNOSIS — F909 Attention-deficit hyperactivity disorder, unspecified type: Secondary | ICD-10-CM | POA: Diagnosis not present

## 2024-02-28 DIAGNOSIS — K219 Gastro-esophageal reflux disease without esophagitis: Secondary | ICD-10-CM

## 2024-02-28 NOTE — Progress Notes (Signed)
 Subjective:    Patient ID: Heather [name], female    DOB: 1976/08/24, 48 y.o.   MRN: [id number]  Patient here for  Chief Complaint  Patient presents with   Hospitalization Follow-up    HPI Here for hospital follow up - admitted 02/22/24 - 02/25/24 - admitted with NSTEMI. Initially presented 02/20/24 - chest pain and sob. CXR, CTPE (negative), TTE - EF 55-60%, no WMA, mild MR). Treated with aspirin  load, IV heparin , IV NTG, high intensity statin. LHC 5/29 - severe one vessel CAD affecting ostial left circumflex and ostial LAD stenosis. Transferred to [facility]. S/p PCE with DES - Lcx vessel. Recommended cardiac rehab. Continue lipitor  and zetia. She is accompanied by her mother. History obtained mostly from Heather [name]. She reports increased fatigue. Prior to this admission, she was having persistent issues with long haul covid and increased cough. Now with increased fatigue. Discussed cardiac rehab. Discussed f/u with cardiology. Breathing relatively stable. Eating. No abdominal pain or bowel change reported.    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 hauler    Depression    bipolar   Dysrhythmia    Fainting spell    H/O   H/O cardiac arrhythmia    Migraines    Seizures (HCC)    Past Surgical History:  Procedure Laterality Date   COLONOSCOPY WITH PROPOFOL  N/A 06/03/2021   Procedure: COLONOSCOPY WITH PROPOFOL ;  Surgeon: Irby Mannan, MD;  Location: [facility] ENDOSCOPY;  Service: Endoscopy;  Laterality: N/A;   ESOPHAGOGASTRODUODENOSCOPY N/A 06/03/2021   Procedure: ESOPHAGOGASTRODUODENOSCOPY (EGD);  Surgeon: Irby Mannan, MD;  Location: [facility] ENDOSCOPY;  Service: Endoscopy;  Laterality: N/A;   LEFT HEART CATH AND CORONARY ANGIOGRAPHY N/A 02/21/2024   Procedure: LEFT HEART CATH AND CORONARY ANGIOGRAPHY;  Surgeon: Wenona Hamilton, MD;  Location: [facility] INVASIVE CV LAB;  Service: Cardiovascular;  Laterality: N/A;   TUBAL LIGATION Bilateral 09/25/2002   tubal reversal      Family History  Problem Relation Age of Onset   Hypertension Mother    Diabetes Mother    Arthritis Mother    Heart disease Mother    Breast cancer Neg Hx    Social History   Socioeconomic History   Marital status: Married    Spouse name: Not on file   Number of children: Not on file   Years of education: Not on file   Highest education level: Not on file  Occupational History   Not on file  Tobacco Use   Smoking status: Former    Current packs/day: 0.00    Average packs/day: 0.5 packs/day for 5.0 years (2.5 ttl pk-yrs)    Types: Cigarettes    Start date: 2008    Quit date: 2013    Years since quitting: 12.4   Smokeless tobacco: Never   Tobacco comments:    Quit mid October 2014  Vaping Use   Vaping status: Never Used  Substance and Sexual Activity   Alcohol use: No    Alcohol/week: 0.0 standard drinks of alcohol   Drug use: No   Sexual activity: Yes    Birth control/protection: None  Other Topics Concern   Not on file  Social History Narrative   Not on file   Social Drivers of Health   Financial Resource Strain: Medium Risk (02/23/2024)   Received from [facility]   Overall Financial Resource Strain (CARDIA)    Difficulty of Paying Living Expenses: Somewhat hard  Food Insecurity: Food  Insecurity Present (02/23/2024)   Received from [facility]   Hunger Vital Sign    Worried About Running Out of Food in the Last Year: Sometimes true    Ran Out of Food in the Last Year: Sometimes true  Transportation Needs: No Transportation Needs (02/23/2024)   Received from [facility] - Transportation    In the past 12 months, has lack of transportation kept you from medical appointments or from getting medications?: No    Lack of Transportation (Non-Medical): No  Physical Activity: Unknown (10/30/2023)   Exercise Vital Sign    Days of Exercise per Week: Patient declined    Minutes of Exercise per Session:  Not on file  Stress: Stress Concern Present (10/30/2023)   [name] [employer] - Occupational Stress Questionnaire    Feeling of Stress : To some extent  Social Connections: Unknown (10/30/2023)   Social Connection and Isolation Panel [NHANES]    Frequency of Communication with Friends and Family: Patient declined    Frequency of Social Gatherings with Friends and Family: Never    Attends Religious Services: Never    Database administrator or Organizations: No    Attends Engineer, structural: Not on file    Marital Status: Patient declined     Review of Systems  Constitutional:  Negative for appetite change, fever and unexpected weight change.  HENT:  Negative for congestion and sinus pressure.   Respiratory:  Negative for chest tightness.        Cough and breathing appear to be improved.   Cardiovascular:  Negative for palpitations and leg swelling.       No chest pain now. No increased lower extremity swelling.   Gastrointestinal:  Negative for abdominal pain, diarrhea, nausea and vomiting.  Genitourinary:  Negative for difficulty urinating and dysuria.  Musculoskeletal:  Negative for joint swelling and myalgias.  Skin:  Negative for color change and rash.       Some bruising over the arms. S/p IVs.   Neurological:  Negative for dizziness and headaches.  Psychiatric/Behavioral:  Negative for agitation and dysphoric mood.        Objective:     BP 128/76   Pulse 80   Temp 98 F (36.7 C)   Resp 16   Ht 5\' 6"  (1.676 m)   Wt 184 lb 3.2 oz (83.6 kg)   LMP 01/22/2024 (Approximate)   SpO2 98%   BMI 29.73 kg/m  Wt Readings from Last 3 Encounters:  02/28/24 184 lb 3.2 oz (83.6 kg)  02/21/24 184 lb 4.9 oz (83.6 kg)  01/15/24 197 lb 8 oz (89.6 kg)    Physical Exam Vitals reviewed.  Constitutional:      General: She is not in acute distress.    Appearance: Normal appearance.  HENT:     Head: Normocephalic and atraumatic.     Right Ear:  External ear normal.     Left Ear: External ear normal.     Mouth/Throat:     Pharynx: No oropharyngeal exudate or posterior oropharyngeal erythema.  Eyes:     General: No scleral icterus.       Right eye: No discharge.        Left eye: No discharge.     Conjunctiva/sclera: Conjunctivae normal.  Neck:     Thyroid : No thyromegaly.  Cardiovascular:     Rate and Rhythm: Normal rate and regular rhythm.  Pulmonary:  Effort: No respiratory distress.     Breath sounds: Normal breath sounds. No wheezing.  Abdominal:     General: Bowel sounds are normal.     Palpations: Abdomen is soft.     Tenderness: There is no abdominal tenderness.  Musculoskeletal:        General: No tenderness.     Cervical back: Neck supple. No tenderness.     Comments: No swelling - arms. Minimal tenderness to palpation - IV insertion sites. No increased erythema.   Lymphadenopathy:     Cervical: No cervical adenopathy.  Skin:    Findings: No erythema or rash.  Neurological:     Mental Status: [name].  Psychiatric:        Mood and Affect: Mood normal.        Behavior: Behavior normal.         Outpatient Encounter Medications as of 02/28/2024  Medication Sig   ezetimibe (ZETIA) 10 MG tablet Take 1 tablet by mouth daily.   metoprolol succinate (TOPROL-XL) 25 MG 24 hr tablet Take 1 tablet by mouth daily.   nitroGLYCERIN  (NITROSTAT ) 0.4 MG SL tablet Place 0.4 mg under the tongue every 5 (five) minutes as needed.   prasugrel (EFFIENT) 10 MG TABS tablet Take 1 tablet by mouth daily.   AIRSUPRA  90-80 MCG/ACT AERO INHALE 2 PUFFS INTO THE LUNGS EVERY 6 HOURS   AJOVY 225 MG/1.5ML SOAJ Inject 225 mg into the skin every 30 (thirty) days. (Patient not taking: Reported on 02/28/2024)   albuterol  (VENTOLIN  HFA) 108 (90 Base) MCG/ACT inhaler INHALE 1 TO 2 PUFFS BY MOUTH EVERY 4 HOURS AS NEEDED FOR WHEEZING FOR SHORTNESS OF BREATH   [Paused] amphetamine -dextroamphetamine  (ADDERALL XR) 20 MG 24 hr capsule Take 20 mg  by mouth every morning. (Patient not taking: Reported on 02/28/2024)   ascorbic acid (VITAMIN C) 1000 MG tablet Take 500 mg by mouth daily.   aspirin  EC 81 MG tablet Take 1 tablet (81 mg total) by mouth daily. Swallow whole.   atorvastatin  (LIPITOR ) 80 MG tablet Take 1 tablet (80 mg total) by mouth daily.   benzonatate  (TESSALON ) 100 MG capsule Take 1 capsule (100 mg total) by mouth every 8 (eight) hours. (Patient taking differently: Take 100 mg by mouth every 8 (eight) hours as needed for cough.)   cetirizine  (ZYRTEC ) 10 MG tablet Take 1 tablet by mouth once daily   cyanocobalamin  (VITAMIN B12) 1000 MCG/ML injection Inject 1,000 mcg into the muscle every 30 (thirty) days.   diazepam  (VALIUM ) 10 MG tablet Take 1 tablet by mouth 4 (four) times daily as needed.   divalproex  (DEPAKOTE  ER) 500 MG 24 hr tablet Take 1,500 mg by mouth at bedtime.   Dupilumab (DUPIXENT) 300 MG/2ML SOAJ Inject 300 mg into the skin every 14 (fourteen) days. **loading dose completed in clinic on 01/04/2024**   fluticasone  (FLONASE ) 50 MCG/ACT nasal spray Place 1 spray into both nostrils daily.   fluticasone -salmeterol (ADVAIR) 500-50 MCG/ACT AEPB Inhale 1 puff into the lungs in the morning and at bedtime.   heparin  25000 UT/250ML infusion Inject 1,350 Units/hr into the vein continuous.   ipratropium (ATROVENT ) 0.03 % nasal spray Place 2 sprays into both nostrils every 12 (twelve) hours.   ipratropium-albuterol  (DUONEB) 0.5-2.5 (3) MG/3ML SOLN Inhale 3 mLs into the lungs every 4 (four) hours as needed.   linaclotide  (LINZESS ) 290 MCG CAPS capsule Take 1 capsule (290 mcg total) by mouth daily before breakfast. (Patient taking differently: Take 290 mcg by mouth daily  as needed.)   montelukast  (SINGULAIR ) 10 MG tablet Take 1 tablet (10 mg total) by mouth at bedtime.   nitroGLYCERIN  0.2 mg/mL infusion Inject 0-200 mcg/min into the vein continuous.   omeprazole  (PRILOSEC) 40 MG capsule Take 1 capsule (40 mg total) by mouth daily  before breakfast.   ondansetron  (ZOFRAN -ODT) 4 MG disintegrating tablet DISSOLVE 1 TABLET IN MOUTH TWICE DAILY AS NEEDED FOR NAUSEA AND  VOMITING   Rimegepant Sulfate  (NURTEC) 75 MG TBDP Take 75 mg by mouth daily as needed. (Patient not taking: Reported on 02/28/2024)   [DISCONTINUED] CAPLYTA 42 MG capsule Take 42 mg by mouth at bedtime. (Patient not taking: Reported on 02/21/2024)   [DISCONTINUED] guaiFENesin  (MUCINEX ) 600 MG 12 hr tablet Take 2 tablets (1,200 mg total) by mouth 2 (two) times daily as needed.   No facility-administered encounter medications on file as of 02/28/2024.     Medications reconciled with pt.   Lab Results  Component Value Date   WBC 12.2 (H) 02/22/2024   HGB 12.9 02/22/2024   HCT 37.3 02/22/2024   PLT 309 02/22/2024   GLUCOSE 78 02/22/2024   CHOL 187 02/21/2024   TRIG 230 (H) 02/21/2024   HDL 35 (L) 02/21/2024   LDLDIRECT 136.0 07/10/2022   LDLCALC 106 (H) 02/21/2024   ALT 40 02/22/2024   AST 48 (H) 02/22/2024   NA 139 02/22/2024   K 3.9 02/22/2024   CL 106 02/22/2024   CREATININE 0.74 02/22/2024   BUN 8 02/22/2024   CO2 24 02/22/2024   TSH 2.26 11/07/2023   INR 1.0 02/20/2024   HGBA1C 4.7 (L) 02/21/2024    CARDIAC CATHETERIZATION Addendum Date: 02/22/2024   Dist LM to Ost LAD lesion is 30% stenosed.   Ost Cx to Prox Cx lesion is 90% stenosed.   Prox RCA lesion is 20% stenosed.   RPAV lesion is 20% stenosed.   LPAV lesion is 60% stenosed. 1.  Severe one-vessel coronary artery disease affecting the ostial left circumflex.  There is mild distal left main and ostial LAD stenosis. 2.  Left ventricular angiography was not performed.  EF was normal by echo. 3.  Moderately elevated left ventricular end-diastolic pressure at 22 mmHg. Recommendations: The ostial left circumflex stenosis is not optimal for PCI and likely requires bifurcation stenting in order to preserve the LAD.  Will discuss tomorrow during the heart team meeting. Resume heparin  drip 2 hours after  TR band removal.  Result Date: 02/22/2024   Dist LM to Ost LAD lesion is 30% stenosed.   Ost Cx to Prox Cx lesion is 90% stenosed.   Prox RCA lesion is 20% stenosed.   RPAV lesion is 20% stenosed. 1.  Severe one-vessel coronary artery disease affecting the ostial left circumflex.  There is mild distal left main and ostial LAD stenosis. 2.  Left ventricular angiography was not performed.  EF was normal by echo. 3.  Moderately elevated left ventricular end-diastolic pressure at 22 mmHg. Recommendations: The ostial left circumflex stenosis is not optimal for PCI and likely requires bifurcation stenting in order to preserve the LAD.  Will discuss tomorrow during the heart team meeting. Resume heparin  drip 2 hours after TR band removal.   ECHOCARDIOGRAM COMPLETE Result Date: 02/21/2024    ECHOCARDIOGRAM REPORT   Patient Name:   ERSA DELANEY [facility] Date of Exam: 02/21/2024 Medical Rec #:  [id number]      Height:       66.0 in Accession #:    [id number]  Weight:       184.3 lb Date of Birth:  1976-03-26      BSA:          1.932 m Patient Age:    47 years       BP:           127/86 mmHg Patient Gender: F              HR:           75 bpm. Exam Location:  [facility] Procedure: 2D Echo, Cardiac Doppler, Color Doppler, Strain Analysis and 3D Echo            (Both Spectral and Color Flow Doppler were utilized during            procedure). Indications:     Chest pain R07.9  History:         Patient has no prior history of Echocardiogram examinations.                  History of cardiac arrhythmia and migraine.  Sonographer:     Broadus Canes Referring Phys:  [id number] XILIN NIU Diagnosing Phys: Constancia Delton MD  Sonographer Comments: Global longitudinal strain was attempted. IMPRESSIONS  1. Left ventricular ejection fraction, by estimation, is 55 to 60%. The left ventricle has normal function. The left ventricle has no regional wall motion abnormalities. Left ventricular diastolic parameters were normal.  2. Right ventricular systolic  function is normal. The right ventricular size is normal.  3. The mitral valve is normal in structure. Mild mitral valve regurgitation.  4. The aortic valve is tricuspid. Aortic valve regurgitation is not visualized. FINDINGS  Left Ventricle: Left ventricular ejection fraction, by estimation, is 55 to 60%. The left ventricle has normal function. The left ventricle has no regional wall motion abnormalities. Global longitudinal strain performed but not reported based on interpreter judgement due to suboptimal tracking. The left ventricular internal cavity size was normal in size. There is no left ventricular hypertrophy. Left ventricular diastolic parameters were normal. Right Ventricle: The right ventricular size is normal. No increase in right ventricular wall thickness. Right ventricular systolic function is normal. Left Atrium: Left atrial size was normal in size. Right Atrium: Right atrial size was normal in size. Pericardium: There is no evidence of pericardial effusion. Mitral Valve: The mitral valve is normal in structure. Mild mitral valve regurgitation. Tricuspid Valve: The tricuspid valve is normal in structure. Tricuspid valve regurgitation is not demonstrated. Aortic Valve: The aortic valve is tricuspid. Aortic valve regurgitation is not visualized. Aortic valve mean gradient measures 2.0 mmHg. Aortic valve peak gradient measures 3.2 mmHg. Aortic valve area, by VTI measures 2.79 cm. Pulmonic Valve: The pulmonic valve was not well visualized. Pulmonic valve regurgitation is not visualized. Aorta: The aortic root is normal in size and structure. Venous: The inferior vena cava was not well visualized. IAS/Shunts: No atrial level shunt detected by color flow Doppler.  LEFT VENTRICLE PLAX 2D LVIDd:         4.90 cm     Diastology LVIDs:         3.60 cm     LV e' medial:    8.81 cm/s LV PW:         1.00 cm     LV E/e' medial:  10.6 LV IVS:        1.20 cm     LV e' lateral:   7.18 cm/s LVOT diam:     2.00 cm  LV E/e' lateral: 13.0 LV SV:         47 LV SV Index:   25 LVOT Area:     3.14 cm  LV Volumes (MOD) LV vol d, MOD A2C: 80.1 ml LV vol d, MOD A4C: 91.7 ml LV vol s, MOD A2C: 50.7 ml LV vol s, MOD A4C: 55.3 ml LV SV MOD A2C:     29.4 ml LV SV MOD A4C:     91.7 ml LV SV MOD BP:      32.7 ml RIGHT VENTRICLE RV Basal diam:  2.40 cm RV Mid diam:    2.00 cm RV S prime:     8.49 cm/s TAPSE (M-mode): 2.0 cm LEFT ATRIUM             Index        RIGHT ATRIUM          Index LA diam:        3.10 cm 1.60 cm/m   RA Area:     7.76 cm LA Vol (A2C):   32.3 ml 16.72 ml/m  RA Volume:   13.40 ml 6.94 ml/m LA Vol (A4C):   18.5 ml 9.58 ml/m LA Biplane Vol: 25.1 ml 12.99 ml/m  AORTIC VALVE AV Area (Vmax):    2.56 cm AV Area (Vmean):   2.53 cm AV Area (VTI):     2.79 cm AV Vmax:           89.90 cm/s AV Vmean:          61.300 cm/s AV VTI:            0.170 m AV Peak Grad:      3.2 mmHg AV Mean Grad:      2.0 mmHg LVOT Vmax:         73.40 cm/s LVOT Vmean:        49.400 cm/s LVOT VTI:          0.151 m LVOT/AV VTI ratio: 0.89  AORTA Ao Root diam: 2.60 cm MITRAL VALVE               TRICUSPID VALVE MV Area (PHT): 5.27 cm    TR Peak grad:   14.6 mmHg MV Decel Time: 144 msec    TR Vmax:        191.00 cm/s MV E velocity: 93.30 cm/s MV A velocity: 84.40 cm/s  SHUNTS MV E/A ratio:  1.11        Systemic VTI:  0.15 m                            Systemic Diam: 2.00 cm Constancia Delton MD Electronically signed by Constancia Delton MD Signature Date/Time: 02/21/2024/12:57:40 PM    Final        Assessment & Plan:  Coronary artery disease involving native coronary artery of native heart, unspecified whether angina present Assessment & Plan: Admitted 02/22/24 - 02/25/24 - admitted with NSTEMI. Initially presented 02/20/24 - chest pain and sob. CXR, CTPE (negative), TTE - EF 55-60%, no WMA, mild MR). Treated with aspirin  load, IV heparin , IV NTG, high intensity statin. LHC 5/29 - severe one vessel CAD affecting ostial left circumflex and ostial LAD  stenosis. Transferred to [facility]. S/p PCE with DES - Lcx vessel. Recommended cardiac rehab. Continue lipitor  and zetia. Discussed cardiac rehab. Keep f/u appt with cardiology. Continue risk factor modification.   Attention deficit hyperactivity disorder (ADHD), unspecified ADHD type Assessment &  Plan: Evaluation [facility] 02/09/22 - Albertine Alpha). Off medication given recent cardiac admission. Stable.    Anxiety Assessment & Plan: Has been followed by psychiatry.    Moderate persistent asthma, unspecified whether complicated Assessment & Plan: Followed by pulmonary - last visit 11/2023 - recommended to: C/w advair diskus 500 1 puff bid. Can use as needed during the day as well during flares.  Prescribed airsupra  as needed.  Dupixent.  C/w Singulair  10mg  At bedtime  Saline nasal rinses  Twice daily    Flonase  nasal 2 puffs BID and Ipratropium Nasal spray 1 spray each nostril bid. Albuterol  inhaler or Duoneb every 6hr as needed    Encounter for completion of form with patient Assessment & Plan: Persistent symptoms and limitations. Recent admission as outlined. Scheduled to follow up with cardiology later this month. Being followed - long haul covid clinic. Recommendations as outlined. Form for extension of short term disability. Remain out of work.    Gastroesophageal reflux disease, unspecified whether esophagitis present Assessment & Plan: No upper symptoms reported. Continue omeprazole .    History of COVID-19 Assessment & Plan: She is being followed - covid clinic. Last covid infection 06/2023. Recommended ST, PT (when able) and OT. Continues f/u with covid clinic. Continue to recommend remaining out of work. . She provides me with paperwork today for extension of her short term.    Hypercholesterolemia Assessment & Plan: Low cholesterol diet and exercise. Follow lipid panel.    Seizure disorder [facility]) Assessment & Plan: Being followed by neurology.  No recent seizures.        Dellar Fenton, MD

## 2024-02-28 NOTE — Transitions of Care (Post Inpatient/ED Visit) (Signed)
   02/28/2024  Name: Heather [name] MRN: [id number] DOB: November 08, 1975  Today's TOC FU Call Status: Today's TOC FU Call Status:: Unsuccessful Call (3rd Attempt) Unsuccessful Call (1st Attempt) Date: 02/26/24 Unsuccessful Call (2nd Attempt) Date: 02/27/24 Unsuccessful Call (3rd Attempt) Date: 02/28/24  Attempted to reach the patient regarding the most recent Inpatient/ED visit.  Follow Up Plan: No further outreach attempts will be made at this time. We have been unable to contact the patient.  Gareld June, BSN, RN [place]  [facility] - [employer] [facility] RN Care Manager [phone]

## 2024-03-02 ENCOUNTER — Encounter: Payer: Self-pay | Admitting: Internal Medicine

## 2024-03-02 DIAGNOSIS — I25119 Atherosclerotic heart disease of native coronary artery with unspecified angina pectoris: Secondary | ICD-10-CM | POA: Insufficient documentation

## 2024-03-02 DIAGNOSIS — I2510 Atherosclerotic heart disease of native coronary artery without angina pectoris: Secondary | ICD-10-CM | POA: Insufficient documentation

## 2024-03-02 NOTE — Assessment & Plan Note (Signed)
 Followed by pulmonary - last visit 11/2023 - recommended to: C/w advair diskus 500 1 puff bid. Can use as needed during the day as well during flares.  Prescribed airsupra  as needed.  Dupixent.  C/w Singulair  10mg  At bedtime  Saline nasal rinses  Twice daily    Flonase  nasal 2 puffs BID and Ipratropium Nasal spray 1 spray each nostril bid. Albuterol  inhaler or Duoneb every 6hr as needed

## 2024-03-02 NOTE — Assessment & Plan Note (Signed)
No upper symptoms reported.  Continue omeprazole.

## 2024-03-02 NOTE — Assessment & Plan Note (Signed)
Has been followed by psychiatry.

## 2024-03-02 NOTE — Assessment & Plan Note (Signed)
 Persistent symptoms and limitations. Recent admission as outlined. Scheduled to follow up with cardiology later this month. Being followed - long haul covid clinic. Recommendations as outlined. Form for extension of short term disability. Remain out of work.

## 2024-03-02 NOTE — Assessment & Plan Note (Signed)
 Admitted 02/22/24 - 02/25/24 - admitted with NSTEMI. Initially presented 02/20/24 - chest pain and sob. CXR, CTPE (negative), TTE - EF 55-60%, no WMA, mild MR). Treated with aspirin  load, IV heparin , IV NTG, high intensity statin. LHC 5/29 - severe one vessel CAD affecting ostial left circumflex and ostial LAD stenosis. Transferred to [facility]. S/p PCE with DES - Lcx vessel. Recommended cardiac rehab. Continue lipitor  and zetia. Discussed cardiac rehab. Keep f/u appt with cardiology. Continue risk factor modification.

## 2024-03-02 NOTE — Assessment & Plan Note (Signed)
 Low cholesterol diet and exercise.  Follow lipid panel.

## 2024-03-02 NOTE — Assessment & Plan Note (Signed)
 Being followed by neurology.  No recent seizures.

## 2024-03-02 NOTE — Assessment & Plan Note (Signed)
 Evaluation [facility] 02/09/22 - Heather [name]). Off medication given recent cardiac admission. Stable.

## 2024-03-02 NOTE — Assessment & Plan Note (Signed)
 She is being followed - covid clinic. Last covid infection 06/2023. Recommended ST, PT (when able) and OT. Continues f/u with covid clinic. Continue to recommend remaining out of work. . She provides me with paperwork today for extension of her short term.

## 2024-03-14 ENCOUNTER — Ambulatory Visit

## 2024-03-14 DIAGNOSIS — E538 Deficiency of other specified B group vitamins: Secondary | ICD-10-CM | POA: Diagnosis not present

## 2024-03-14 MED ORDER — CYANOCOBALAMIN 1000 MCG/ML IJ SOLN
1000.0000 ug | Freq: Once | INTRAMUSCULAR | Status: AC
  Administered 2024-03-14: 1000 ug via INTRAMUSCULAR

## 2024-03-14 NOTE — Progress Notes (Signed)
 After obtaining consent, and per orders of Dr.Scott, MD injection of B-12 given by Virgina Grills, CMA in L Deltoid. Patient tolerated injection well.

## 2024-04-02 ENCOUNTER — Encounter: Attending: Internal Medicine | Admitting: *Deleted

## 2024-04-02 DIAGNOSIS — Z48812 Encounter for surgical aftercare following surgery on the circulatory system: Secondary | ICD-10-CM | POA: Insufficient documentation

## 2024-04-02 DIAGNOSIS — Z955 Presence of coronary angioplasty implant and graft: Secondary | ICD-10-CM | POA: Insufficient documentation

## 2024-04-02 DIAGNOSIS — I214 Non-ST elevation (NSTEMI) myocardial infarction: Secondary | ICD-10-CM | POA: Insufficient documentation

## 2024-04-02 NOTE — Progress Notes (Signed)
 Initial phone call completed. Diagnosis can be found in [facility] 6/19. EP Orientation scheduled for Thursday 7/10 at 8:30.

## 2024-04-03 ENCOUNTER — Encounter

## 2024-04-03 ENCOUNTER — Ambulatory Visit: Admitting: Pulmonary Disease

## 2024-04-03 ENCOUNTER — Encounter: Payer: Self-pay | Admitting: Internal Medicine

## 2024-04-03 ENCOUNTER — Encounter: Payer: Self-pay | Admitting: Pulmonary Disease

## 2024-04-03 VITALS — BP 140/90 | HR 74 | Temp 97.1°F | Ht 67.4 in | Wt 188.0 lb

## 2024-04-03 VITALS — Ht 67.4 in | Wt 191.9 lb

## 2024-04-03 DIAGNOSIS — Z955 Presence of coronary angioplasty implant and graft: Secondary | ICD-10-CM | POA: Diagnosis not present

## 2024-04-03 DIAGNOSIS — Z87891 Personal history of nicotine dependence: Secondary | ICD-10-CM

## 2024-04-03 DIAGNOSIS — J4550 Severe persistent asthma, uncomplicated: Secondary | ICD-10-CM

## 2024-04-03 DIAGNOSIS — I214 Non-ST elevation (NSTEMI) myocardial infarction: Secondary | ICD-10-CM

## 2024-04-03 DIAGNOSIS — Z48812 Encounter for surgical aftercare following surgery on the circulatory system: Secondary | ICD-10-CM | POA: Diagnosis not present

## 2024-04-03 MED ORDER — INCRUSE ELLIPTA 62.5 MCG/ACT IN AEPB: 1.0000 | INHALATION_SPRAY | Freq: Every day | RESPIRATORY_TRACT | 3 refills | Status: DC

## 2024-04-03 NOTE — Patient Instructions (Signed)
 Patient Instructions  Patient Details  Name: Heather [name] MRN: [id number] Date of Birth: December 11, 1975 Referring Provider:  Florencio Cara BIRCH, MD  Below are your personal goals for exercise, nutrition, and risk factors. Our goal is to help you stay on track towards obtaining and maintaining these goals. We will be discussing your progress on these goals with you throughout the program.  Initial Exercise Prescription:  Initial Exercise Prescription - 04/03/24 1000       Date of Initial Exercise RX and Referring Provider   Date 04/03/24    Referring Provider Florencio Cara, MD      Oxygen   Maintain Oxygen Saturation 88% or higher      Treadmill   MPH 1.8   Try   Grade 0    Minutes 15    METs 2.38      Recumbant Bike   Level 3    RPM 50    Watts 25    Minutes 15    METs 3.42      NuStep   Level 3    SPM 80    Minutes 15    METs 3.42      T5 Nustep   Level 3    SPM 80    Minutes 15    METs 3.42      Track   Laps 23    Minutes 15    METs 2.25      Prescription Details   Frequency (times per week) 3    Duration Progress to 30 minutes of continuous aerobic without signs/symptoms of physical distress      Intensity   THRR 40-80% of Max Heartrate 108-150    Ratings of Perceived Exertion 11-13    Perceived Dyspnea 0-4      Progression   Progression Continue to progress workloads to maintain intensity without signs/symptoms of physical distress.      Resistance Training   Training Prescription Yes    Weight 5 lb    Reps 10-15          Exercise Goals: Frequency: Be able to perform aerobic exercise two to three times per week in program working toward 2-5 days per week of home exercise.  Intensity: Work with a perceived exertion of 11 (fairly light) - 15 (hard) while following your exercise prescription.  We will make changes to your prescription with you as you progress through the program.   Duration: Be able to do 30 to 45 minutes of continuous  aerobic exercise in addition to a 5 minute warm-up and a 5 minute cool-down routine.   Nutrition Goals: Your personal nutrition goals will be established when you do your nutrition analysis with the dietician.  The following are general nutrition guidelines to follow: Cholesterol < 200mg /day Sodium < 1500mg /day Fiber: Women under 50 yrs - 25 grams per day  Personal Goals:  Personal Goals and Risk Factors at Admission - 04/03/24 1049       Core Components/Risk Factors/Patient Goals on Admission    Weight Management Yes;Weight Loss    Intervention Weight Management: Develop a combined nutrition and exercise program designed to reach desired caloric intake, while maintaining appropriate intake of nutrient and fiber, sodium and fats, and appropriate energy expenditure required for the weight goal.;Weight Management: Provide education and appropriate resources to help participant work on and attain dietary goals.;Weight Management/Obesity: Establish reasonable short term and long term weight goals.    Admit Weight 191 lb 14.4 oz (87 kg)  Goal Weight: Short Term 169 lb 8 oz (76.9 kg)    Goal Weight: Long Term 148 lb (67.1 kg)    Expected Outcomes Short Term: Continue to assess and modify interventions until short term weight is achieved;Long Term: Adherence to nutrition and physical activity/exercise program aimed toward attainment of established weight goal;Weight Loss: Understanding of general recommendations for a balanced deficit meal plan, which promotes 1-2 lb weight loss per week and includes a negative energy balance of [phone] kcal/d;Understanding recommendations for meals to include 15-35% energy as protein, 25-35% energy from fat, 35-60% energy from carbohydrates, less than 200mg  of dietary cholesterol, 20-35 gm of total fiber daily;Understanding of distribution of calorie intake throughout the day with the consumption of 4-5 meals/snacks    Hypertension Yes    Intervention Provide  education on lifestyle modifcations including regular physical activity/exercise, weight management, moderate sodium restriction and increased consumption of fresh fruit, vegetables, and low fat dairy, alcohol moderation, and smoking cessation.;Monitor prescription use compliance.    Expected Outcomes Short Term: Continued assessment and intervention until BP is < 140/71mm HG in hypertensive participants. < 130/22mm HG in hypertensive participants with diabetes, heart failure or chronic kidney disease.;Long Term: Maintenance of blood pressure at goal levels.    Lipids Yes    Intervention Provide education and support for participant on nutrition & aerobic/resistive exercise along with prescribed medications to achieve LDL 70mg , HDL >40mg .    Expected Outcomes Short Term: Participant states understanding of desired cholesterol values and is compliant with medications prescribed. Participant is following exercise prescription and nutrition guidelines.;Long Term: Cholesterol controlled with medications as prescribed, with individualized exercise RX and with personalized nutrition plan. Value goals: LDL < 70mg , HDL > 40 mg.          Tobacco Use Initial Evaluation: Social History   Tobacco Use  Smoking Status Former   Current packs/day: 0.00   Average packs/day: 0.5 packs/day for 5.0 years (2.5 ttl pk-yrs)   Types: Cigarettes   Start date: 2008   Quit date: 2013   Years since quitting: 12.5  Smokeless Tobacco Never  Tobacco Comments   Quit mid October 2014    Exercise Goals and Review:  Exercise Goals     Row Name 04/03/24 1048             Exercise Goals   Increase Physical Activity Yes       Intervention Provide advice, education, support and counseling about physical activity/exercise needs.;Develop an individualized exercise prescription for aerobic and resistive training based on initial evaluation findings, risk stratification, comorbidities and participant's personal goals.        Expected Outcomes Short Term: Attend rehab on a regular basis to increase amount of physical activity.;Long Term: Add in home exercise to make exercise part of routine and to increase amount of physical activity.;Long Term: Exercising regularly at least 3-5 days a week.       Increase Strength and Stamina Yes       Intervention Provide advice, education, support and counseling about physical activity/exercise needs.;Develop an individualized exercise prescription for aerobic and resistive training based on initial evaluation findings, risk stratification, comorbidities and participant's personal goals.       Expected Outcomes Short Term: Increase workloads from initial exercise prescription for resistance, speed, and METs.;Short Term: Perform resistance training exercises routinely during rehab and add in resistance training at home;Long Term: Improve cardiorespiratory fitness, muscular endurance and strength as measured by increased METs and functional capacity ( )  Able to understand and use rate of perceived exertion (RPE) scale Yes       Intervention Provide education and explanation on how to use RPE scale       Expected Outcomes Short Term: Able to use RPE daily in rehab to express subjective intensity level;Long Term:  Able to use RPE to guide intensity level when exercising independently       Able to understand and use Dyspnea scale Yes       Intervention Provide education and explanation on how to use Dyspnea scale       Expected Outcomes Short Term: Able to use Dyspnea scale daily in rehab to express subjective sense of shortness of breath during exertion;Long Term: Able to use Dyspnea scale to guide intensity level when exercising independently       Knowledge and understanding of Target Heart Rate Range (THRR) Yes       Intervention Provide education and explanation of THRR including how the numbers were predicted and where they are located for reference       Expected Outcomes  Short Term: Able to state/look up THRR;Short Term: Able to use daily as guideline for intensity in rehab;Long Term: Able to use THRR to govern intensity when exercising independently       Able to check pulse independently Yes       Intervention Provide education and demonstration on how to check pulse in carotid and radial arteries.;Review the importance of being able to check your own pulse for safety during independent exercise       Expected Outcomes Short Term: Able to explain why pulse checking is important during independent exercise;Long Term: Able to check pulse independently and accurately       Understanding of Exercise Prescription Yes       Intervention Provide education, explanation, and written materials on patient's individual exercise prescription       Expected Outcomes Short Term: Able to explain program exercise prescription;Long Term: Able to explain home exercise prescription to exercise independently

## 2024-04-03 NOTE — Progress Notes (Signed)
 Cardiac Individual Treatment Plan  Patient Details  Name: Heather [name] MRN: [id number] Date of Birth: [birth year] Referring Provider:   Flowsheet Row Cardiac Rehab from 04/03/2024 in [facility] Cardiac and Pulmonary Rehab  Referring Provider Florencio Kava, MD    Initial Encounter Date:  Flowsheet Row Cardiac Rehab from 04/03/2024 in [facility] Cardiac and Pulmonary Rehab  Date 04/03/24    Visit Diagnosis: NSTEMI (non-ST elevated myocardial infarction) [facility])  Status post coronary artery stent placement  Patient's Home Medications on Admission:  Current Outpatient Medications:    AIRSUPRA  90-80 MCG/ACT AERO, INHALE 2 PUFFS INTO THE LUNGS EVERY 6 HOURS, Disp: 11 g, Rfl: 0   AJOVY 225 MG/1.5ML SOAJ, Inject 225 mg into the skin every 30 (thirty) days. (Patient not taking: Reported on 02/28/2024), Disp: , Rfl:    albuterol  (VENTOLIN  HFA) 108 (90 Base) MCG/ACT inhaler, INHALE 1 TO 2 PUFFS BY MOUTH EVERY 4 HOURS AS NEEDED FOR WHEEZING FOR SHORTNESS OF BREATH, Disp: 18 g, Rfl: 1   [Paused] amphetamine -dextroamphetamine  (ADDERALL XR) 20 MG 24 hr capsule, Take 20 mg by mouth every morning. (Patient not taking: Reported on 02/28/2024), Disp: , Rfl:    ascorbic acid (VITAMIN C) 1000 MG tablet, Take 500 mg by mouth daily., Disp: , Rfl:    aspirin  EC 81 MG tablet, Take 1 tablet (81 mg total) by mouth daily. Swallow whole., Disp: , Rfl:    atorvastatin  (LIPITOR ) 80 MG tablet, Take 1 tablet (80 mg total) by mouth daily., Disp: , Rfl:    benzonatate  (TESSALON ) 100 MG capsule, Take 1 capsule (100 mg total) by mouth every 8 (eight) hours. (Patient taking differently: Take 100 mg by mouth every 8 (eight) hours as needed for cough.), Disp: 21 capsule, Rfl: 0   cetirizine  (ZYRTEC ) 10 MG tablet, Take 1 tablet by mouth once daily, Disp: 90 tablet, Rfl: 3   cyanocobalamin  (VITAMIN B12) 1000 MCG/ML injection, Inject 1,000 mcg into the muscle every 30 (thirty) days., Disp: , Rfl:    diazepam  (VALIUM ) 10 MG tablet, Take 1  tablet by mouth 4 (four) times daily as needed., Disp: , Rfl:    divalproex  (DEPAKOTE  ER) 500 MG 24 hr tablet, Take 1,500 mg by mouth at bedtime., Disp: , Rfl:    Dupilumab  (DUPIXENT ) 300 MG/2ML SOAJ, Inject 300 mg into the skin every 14 (fourteen) days. **loading dose completed in clinic on 01/04/2024**, Disp: 12 mL, Rfl: 1   ezetimibe (ZETIA) 10 MG tablet, Take 1 tablet by mouth daily., Disp: , Rfl:    fluticasone  (FLONASE ) 50 MCG/ACT nasal spray, Place 1 spray into both nostrils daily., Disp: 48 g, Rfl: 1   fluticasone -salmeterol (ADVAIR) 500-50 MCG/ACT AEPB, Inhale 1 puff into the lungs in the morning and at bedtime., Disp: 60 each, Rfl: 11   heparin  25000 UT/250ML infusion, Inject 1,350 Units/hr into the vein continuous., Disp: , Rfl:    ipratropium (ATROVENT ) 0.03 % nasal spray, Place 2 sprays into both nostrils every 12 (twelve) hours., Disp: 30 mL, Rfl: 12   ipratropium-albuterol  (DUONEB) 0.5-2.5 (3) MG/3ML SOLN, Inhale 3 mLs into the lungs every 4 (four) hours as needed., Disp: 360 mL, Rfl: 11   linaclotide  (LINZESS ) 290 MCG CAPS capsule, Take 1 capsule (290 mcg total) by mouth daily before breakfast. (Patient taking differently: Take 290 mcg by mouth daily as needed.), Disp: 30 capsule, Rfl: 1   metoprolol succinate (TOPROL-XL) 25 MG 24 hr tablet, Take 1 tablet by mouth daily., Disp: , Rfl:    montelukast  (SINGULAIR ) 10 MG  tablet, Take 1 tablet (10 mg total) by mouth at bedtime., Disp: 30 tablet, Rfl: 11   nitroGLYCERIN  (NITROSTAT ) 0.4 MG SL tablet, Place 0.4 mg under the tongue every 5 (five) minutes as needed., Disp: , Rfl:    nitroGLYCERIN  0.2 mg/mL infusion, Inject 0-200 mcg/min into the vein continuous., Disp: , Rfl:    omeprazole  (PRILOSEC) 40 MG capsule, Take 1 capsule (40 mg total) by mouth daily before breakfast., Disp: 30 capsule, Rfl: 3   ondansetron  (ZOFRAN -ODT) 4 MG disintegrating tablet, DISSOLVE 1 TABLET IN MOUTH TWICE DAILY AS NEEDED FOR NAUSEA AND  VOMITING, Disp: 20 tablet,  Rfl: 0   prasugrel (EFFIENT) 10 MG TABS tablet, Take 1 tablet by mouth daily., Disp: , Rfl:    Rimegepant Sulfate  (NURTEC) 75 MG TBDP, Take 75 mg by mouth daily as needed. 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    Depression    bipolar   Dysrhythmia    Fainting spell    H/O   H/O cardiac arrhythmia    Migraines    Seizures (HCC)     Tobacco Use: Social History   Tobacco Use  Smoking Status Former   Current packs/day: 0.00   Average packs/day: 0.5 packs/day for 5.0 years (2.5 ttl pk-yrs)   Types: Cigarettes   Start date: 2008   Quit date: 2013   Years since quitting: 12.5  Smokeless Tobacco Never  Tobacco Comments   Quit mid October 2014    Labs: Review Flowsheet  More data exists      Latest Ref Rng & Units 05/18/2021 08/29/2021 07/10/2022 11/07/2023 02/21/2024  Labs for ITP Cardiac and Pulmonary Rehab  Cholestrol 0 - 200 mg/dL 829  793  792  800  812   LDL (calc) 0 - 99 mg/dL 889  865  - 898  893   Direct LDL mg/dL - - 863.9  - -  HDL-C >59 mg/dL 60.89  60.79  63.39  61.39  35   Trlycerides <150 mg/dL 894.9  835.9  649.9  705.9  230   Hemoglobin A1c 4.8 - 5.6 % - - - - 4.7      Exercise Target Goals: Exercise Program Goal: Individual exercise prescription set using results from initial 6 min walk test and THRR while considering  patient's activity barriers and safety.   Exercise Prescription Goal: Initial exercise prescription builds to 30-45 minutes a day of aerobic activity, 2-3 days per week.  Home exercise guidelines will be given to patient during program as part of exercise prescription that the participant will acknowledge.   Education: Aerobic Exercise: - Group verbal and visual presentation on the components of exercise prescription. Introduces F.I.T.T principle from ACSM for exercise prescriptions.  Reviews F.I.T.T. principles of aerobic  exercise including progression. Written material given at graduation.   Education: Resistance Exercise: - Group verbal and visual presentation on the components of exercise prescription. Introduces F.I.T.T principle from ACSM for exercise prescriptions  Reviews F.I.T.T. principles of resistance exercise including progression. Written material given at graduation.    Education: Exercise & Equipment Safety: - Individual verbal instruction and demonstration of equipment use and safety with use of the equipment. Flowsheet Row Cardiac Rehab from 04/03/2024 in [facility] Cardiac and Pulmonary Rehab  Date 04/03/24  Educator MB  Instruction Review Code 1- Verbalizes Understanding    Education: Exercise Physiology & General Exercise Guidelines: -  Group verbal and written instruction with models to review the exercise physiology of the cardiovascular system and associated critical values. Provides general exercise guidelines with specific guidelines to those with heart or lung disease.    Education: Flexibility, Balance, Mind/Body Relaxation: - Group verbal and visual presentation with interactive activity on the components of exercise prescription. Introduces F.I.T.T principle from ACSM for exercise prescriptions. Reviews F.I.T.T. principles of flexibility and balance exercise training including progression. Also discusses the mind body connection.  Reviews various relaxation techniques to help reduce and manage stress (i.e. Deep breathing, progressive muscle relaxation, and visualization). Balance handout provided to take home. Written material given at graduation.   Activity Barriers & Risk Stratification:  Activity Barriers & Cardiac Risk Stratification - 04/03/24 1045       Activity Barriers & Cardiac Risk Stratification   Activity Barriers Joint Problems;Back Problems;Other (comment);Balance Concerns    Comments Long Covid- lower extremity issues (aches/hurts/impacts balance sometimes)    Cardiac  Risk Stratification Moderate          6 Minute Walk:  6 Minute Walk     Row Name 04/03/24 1043         6 Minute Walk   Phase Initial     Distance 965 feet     Walk Time 6 minutes     # of Rest Breaks 0     MPH 1.83     METS 3.42     RPE 11     Perceived Dyspnea  1     VO2 Peak 11.98     Symptoms Yes (comment)     Comments aching legs and fatigue (used railing some)     Resting HR 66 bpm     Resting BP 144/90     Resting Oxygen Saturation  97 %     Exercise Oxygen Saturation  during 6 min walk 97 %     Max Ex. HR 90 bpm     Max Ex. BP 150/88     2 Minute Post BP 130/88        Oxygen Initial Assessment:   Oxygen Re-Evaluation:   Oxygen Discharge (Final Oxygen Re-Evaluation):   Initial Exercise Prescription:  Initial Exercise Prescription - 04/03/24 1000       Date of Initial Exercise RX and Referring Provider   Date 04/03/24    Referring Provider Florencio Kava, MD      Oxygen   Maintain Oxygen Saturation 88% or higher      Treadmill   MPH 1.8   Try   Grade 0    Minutes 15    METs 2.38      Recumbant Bike   Level 3    RPM 50    Watts 25    Minutes 15    METs 3.42      NuStep   Level 3    SPM 80    Minutes 15    METs 3.42      T5 Nustep   Level 3    SPM 80    Minutes 15    METs 3.42      Track   Laps 23    Minutes 15    METs 2.25      Prescription Details   Frequency (times per week) 3    Duration Progress to 30 minutes of continuous aerobic without signs/symptoms of physical distress      Intensity   THRR 40-80% of Max Heartrate 108-150    Ratings  of Perceived Exertion 11-13    Perceived Dyspnea 0-4      Progression   Progression Continue to progress workloads to maintain intensity without signs/symptoms of physical distress.      Resistance Training   Training Prescription Yes    Weight 5 lb    Reps 10-15          Perform Capillary Blood Glucose checks as needed.  Exercise Prescription Changes:   Exercise  Prescription Changes     Row Name 04/03/24 1000             Response to Exercise   Blood Pressure (Admit) 144/90       Blood Pressure (Exercise) 150/88       Blood Pressure (Exit) 130/88       Heart Rate (Admit) 66 bpm       Heart Rate (Exercise) 90 bpm       Heart Rate (Exit) 72 bpm       Oxygen Saturation (Admit) 97 %       Oxygen Saturation (Exercise) 97 %       Oxygen Saturation (Exit) 96 %       Rating of Perceived Exertion (Exercise) 11       Perceived Dyspnea (Exercise) 1       Symptoms aching legs and fatigue       Comments results       Intensity THRR New         Progression   Average METs 3.42          Exercise Comments:   Exercise Goals and Review:   Exercise Goals     Row Name 04/03/24 1048             Exercise Goals   Increase Physical Activity Yes       Intervention Provide advice, education, support and counseling about physical activity/exercise needs.;Develop an individualized exercise prescription for aerobic and resistive training based on initial evaluation findings, risk stratification, comorbidities and participant's personal goals.       Expected Outcomes Short Term: Attend rehab on a regular basis to increase amount of physical activity.;Long Term: Add in home exercise to make exercise part of routine and to increase amount of physical activity.;Long Term: Exercising regularly at least 3-5 days a week.       Increase Strength and Stamina Yes       Intervention Provide advice, education, support and counseling about physical activity/exercise needs.;Develop an individualized exercise prescription for aerobic and resistive training based on initial evaluation findings, risk stratification, comorbidities and participant's personal goals.       Expected Outcomes Short Term: Increase workloads from initial exercise prescription for resistance, speed, and METs.;Short Term: Perform resistance training exercises routinely during rehab and add in  resistance training at home;Long Term: Improve cardiorespiratory fitness, muscular endurance and strength as measured by increased METs and functional capacity ( )       Able to understand and use rate of perceived exertion (RPE) scale Yes       Intervention Provide education and explanation on how to use RPE scale       Expected Outcomes Short Term: Able to use RPE daily in rehab to express subjective intensity level;Long Term:  Able to use RPE to guide intensity level when exercising independently       Able to understand and use Dyspnea scale Yes       Intervention Provide education and explanation on how to use Dyspnea scale  Expected Outcomes Short Term: Able to use Dyspnea scale daily in rehab to express subjective sense of shortness of breath during exertion;Long Term: Able to use Dyspnea scale to guide intensity level when exercising independently       Knowledge and understanding of Target Heart Rate Range (THRR) Yes       Intervention Provide education and explanation of THRR including how the numbers were predicted and where they are located for reference       Expected Outcomes Short Term: Able to state/look up THRR;Short Term: Able to use daily as guideline for intensity in rehab;Long Term: Able to use THRR to govern intensity when exercising independently       Able to check pulse independently Yes       Intervention Provide education and demonstration on how to check pulse in carotid and radial arteries.;Review the importance of being able to check your own pulse for safety during independent exercise       Expected Outcomes Short Term: Able to explain why pulse checking is important during independent exercise;Long Term: Able to check pulse independently and accurately       Understanding of Exercise Prescription Yes       Intervention Provide education, explanation, and written materials on patient's individual exercise prescription       Expected Outcomes Short Term: Able to  explain program exercise prescription;Long Term: Able to explain home exercise prescription to exercise independently          Exercise Goals Re-Evaluation :   Discharge Exercise Prescription (Final Exercise Prescription Changes):  Exercise Prescription Changes - 04/03/24 1000       Response to Exercise   Blood Pressure (Admit) 144/90    Blood Pressure (Exercise) 150/88    Blood Pressure (Exit) 130/88    Heart Rate (Admit) 66 bpm    Heart Rate (Exercise) 90 bpm    Heart Rate (Exit) 72 bpm    Oxygen Saturation (Admit) 97 %    Oxygen Saturation (Exercise) 97 %    Oxygen Saturation (Exit) 96 %    Rating of Perceived Exertion (Exercise) 11    Perceived Dyspnea (Exercise) 1    Symptoms aching legs and fatigue    Comments results    Intensity THRR New      Progression   Average METs 3.42          Nutrition:  Target Goals: Understanding of nutrition guidelines, daily intake of sodium 1500mg , cholesterol 200mg , calories 30% from fat and 7% or less from saturated fats, daily to have 5 or more servings of fruits and vegetables.  Education: All About Nutrition: -Group instruction provided by verbal, written material, interactive activities, discussions, models, and posters to present general guidelines for heart healthy nutrition including fat, fiber, MyPlate, the role of sodium in heart healthy nutrition, utilization of the nutrition label, and utilization of this knowledge for meal planning. Follow up email sent as well. Written material given at graduation.   Biometrics:  Pre Biometrics - 04/03/24 1048       Pre Biometrics   Height 5' 7.4 (1.712 m)    Weight 191 lb 14.4 oz (87 kg)    Waist Circumference 41.9 inches    Hip Circumference 47 inches    Waist to Hip Ratio 0.89 %    BMI (Calculated) 29.7    Single Leg Stand 23 seconds           Nutrition Therapy Plan and Nutrition Goals:  Nutrition Therapy & Goals -  04/03/24 1049       Nutrition Therapy   RD  appointment deferred Yes      Personal Nutrition Goals   Nutrition Goal RD appointment deferred at this time, will think about maybe scheduling in the future      Intervention Plan   Intervention Prescribe, educate and counsel regarding individualized specific dietary modifications aiming towards targeted core components such as weight, hypertension, lipid management, diabetes, heart failure and other comorbidities.    Expected Outcomes Short Term Goal: Understand basic principles of dietary content, such as calories, fat, sodium, cholesterol and nutrients.          Nutrition Assessments:  MEDIFICTS Score Key: >=70 Need to make dietary changes  40-70 Heart Healthy Diet <= 40 Therapeutic Level Cholesterol Diet  Flowsheet Row Cardiac Rehab from 04/02/2024 in [facility] Cardiac and Pulmonary Rehab  Picture Your Plate Total Score on Admission 59   Picture Your Plate Scores: <59 Unhealthy dietary pattern with much room for improvement. 41-50 Dietary pattern unlikely to meet recommendations for good health and room for improvement. 51-60 More healthful dietary pattern, with some room for improvement.  >60 Healthy dietary pattern, although there may be some specific behaviors that could be improved.    Nutrition Goals Re-Evaluation:   Nutrition Goals Discharge (Final Nutrition Goals Re-Evaluation):   Psychosocial: Target Goals: Acknowledge presence or absence of significant depression and/or stress, maximize coping skills, provide positive support system. Participant is able to verbalize types and ability to use techniques and skills needed for reducing stress and depression.   Education: Stress, Anxiety, and Depression - Group verbal and visual presentation to define topics covered.  Reviews how body is impacted by stress, anxiety, and depression.  Also discusses healthy ways to reduce stress and to treat/manage anxiety and depression.  Written material given at graduation.   Education:  Sleep Hygiene -Provides group verbal and written instruction about how sleep can affect your health.  Define sleep hygiene, discuss sleep cycles and impact of sleep habits. Review good sleep hygiene tips.    Initial Review & Psychosocial Screening:  Initial Psych Review & Screening - 04/02/24 1117       Initial Review   Current issues with Current Stress Concerns;Current Psychotropic Meds    Source of Stress Concerns Chronic Illness;Unable to participate in former interests or hobbies;Unable to perform yard/household activities      Barriers   Psychosocial barriers to participate in program There are no identifiable barriers or psychosocial needs.      Screening Interventions   Interventions Encouraged to exercise;To provide support and resources with identified psychosocial needs;Provide feedback about the scores to participant    Expected Outcomes Short Term goal: Utilizing psychosocial counselor, staff and physician to assist with identification of specific Stressors or current issues interfering with healing process. Setting desired goal for each stressor or current issue identified.;Long Term Goal: Stressors or current issues are controlled or eliminated.;Short Term goal: Identification and review with participant of any Quality of Life or Depression concerns found by scoring the questionnaire.;Long Term goal: The participant improves quality of Life and PHQ9 Scores as seen by post scores and/or verbalization of changes          Quality of Life Scores:   Scores of 19 and below usually indicate a poorer quality of life in these areas.  A difference of  2-3 points is a clinically meaningful difference.  A difference of 2-3 points in the total score of the Quality of Life Index has  been associated with significant improvement in overall quality of life, self-image, physical symptoms, and general health in studies assessing change in quality of life.  PHQ-9: Review Flowsheet  More data  exists      04/03/2024 04/27/2023 10/26/2022 07/19/2022 06/09/2022  Depression screen PHQ 2/9  Decreased Interest 3 1 3  0 1  Down, Depressed, Hopeless 1 1 0 0 0  PHQ - 2 Score 4 2 3  0 1  Altered sleeping 3 - 0 - -  Tired, decreased energy 3 0 3 - -  Change in appetite 0 0 0 - -  Feeling bad or failure about yourself  1 1 1  - -  Trouble concentrating 3 1 2  - -  Moving slowly or fidgety/restless 1 0 0 - -  Suicidal thoughts 0 0 0 - -  PHQ-9 Score 15 - 9 - -  Difficult doing work/chores Extremely dIfficult Extremely dIfficult Extremely dIfficult - -   Interpretation of Total Score  Total Score Depression Severity:  1-4 = Minimal depression, 5-9 = Mild depression, 10-14 = Moderate depression, 15-19 = Moderately severe depression, 20-27 = Severe depression   Psychosocial Evaluation and Intervention:  Psychosocial Evaluation - 04/02/24 1118       Psychosocial Evaluation & Interventions   Interventions Encouraged to exercise with the program and follow exercise prescription;Stress management education;Relaxation education    Comments Anyely is coming to cardiac rehab 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 She is meeting with therapists to help with her long covid symptoms. She is on mood stabilizer medication as well to help with symptoms. She is wary of attending the program because she doesn't know if she will be able to finish it, but she wants to learn as much as possible. She is on long term disability from Covid because if she is very active, it takes her 2-3 days to recover. She is learning to take breaks and pace herself. She is being followed with the long covid clinic at [facility] which has been beneficial for her.    Expected Outcomes Short: attend cardiac rehab for education and exercise Long: develop and maintain positive self care habits    Continue Psychosocial Services  Follow  up required by staff          Psychosocial Re-Evaluation:   Psychosocial Discharge (Final Psychosocial Re-Evaluation):   Vocational Rehabilitation: Provide vocational rehab assistance to qualifying candidates.   Vocational Rehab Evaluation & Intervention:  Vocational Rehab - 04/02/24 1117       Initial Vocational Rehab Evaluation & Intervention   Assessment shows need for Vocational Rehabilitation No          Education: Education Goals: Education classes will be provided on a variety of topics geared toward better understanding of heart health and risk factor modification. Participant will state understanding/return demonstration of topics presented as noted by education test scores.  Learning Barriers/Preferences:  Learning Barriers/Preferences - 04/02/24 1116       Learning Barriers/Preferences   Learning Barriers Exercise Concerns   Long Covid- brain fog   Learning Preferences Individual Instruction          General Cardiac Education Topics:  AED/CPR: - Group verbal and written instruction with the use of models to demonstrate the basic use of the AED with the basic ABC's of resuscitation.   Anatomy and Cardiac Procedures: - Group verbal and visual presentation and models provide  information about basic cardiac anatomy and function. Reviews the testing methods done to diagnose heart disease and the outcomes of the test results. Describes the treatment choices: Medical Management, Angioplasty, or Coronary Bypass Surgery for treating various heart conditions including Myocardial Infarction, Angina, Valve Disease, and Cardiac Arrhythmias.  Written material given at graduation.   Medication Safety: - Group verbal and visual instruction to review commonly prescribed medications for heart and lung disease. Reviews the medication, class of the drug, and side effects. Includes the steps to properly store meds and maintain the prescription regimen.  Written material  given at graduation.   Intimacy: - Group verbal instruction through game format to discuss how heart and lung disease can affect sexual intimacy. Written material given at graduation..   Know Your Numbers and Heart Failure: - Group verbal and visual instruction to discuss disease risk factors for cardiac and pulmonary disease and treatment options.  Reviews associated critical values for Overweight/Obesity, Hypertension, Cholesterol, and Diabetes.  Discusses basics of heart failure: signs/symptoms and treatments.  Introduces Heart Failure Zone chart for action plan for heart failure.  Written material given at graduation.   Infection Prevention: - Provides verbal and written material to individual with discussion of infection control including proper hand washing and proper equipment cleaning during exercise session. Flowsheet Row Cardiac Rehab from 04/03/2024 in [facility] Cardiac and Pulmonary Rehab  Date 04/03/24  Educator MB  Instruction Review Code 1- Verbalizes Understanding    Falls Prevention: - Provides verbal and written material to individual with discussion of falls prevention and safety. Flowsheet Row Cardiac Rehab from 04/03/2024 in [facility] Cardiac and Pulmonary Rehab  Date 04/03/24  Educator MB  Instruction Review Code 1- Verbalizes Understanding    Other: -Provides group and verbal instruction on various topics (see comments)   Knowledge Questionnaire Score:   Core Components/Risk Factors/Patient Goals at Admission:  Personal Goals and Risk Factors at Admission - 04/03/24 1049       Core Components/Risk Factors/Patient Goals on Admission    Weight Management Yes;Weight Loss    Intervention Weight Management: Develop a combined nutrition and exercise program designed to reach desired caloric intake, while maintaining appropriate intake of nutrient and fiber, sodium and fats, and appropriate energy expenditure required for the weight goal.;Weight Management: Provide  education and appropriate resources to help participant work on and attain dietary goals.;Weight Management/Obesity: Establish reasonable short term and long term weight goals.    Admit Weight 191 lb 14.4 oz (87 kg)    Goal Weight: Short Term 169 lb 8 oz (76.9 kg)    Goal Weight: Long Term 148 lb (67.1 kg)    Expected Outcomes Short Term: Continue to assess and modify interventions until short term weight is achieved;Long Term: Adherence to nutrition and physical activity/exercise program aimed toward attainment of established weight goal;Weight Loss: Understanding of general recommendations for a balanced deficit meal plan, which promotes 1-2 lb weight loss per week and includes a negative energy balance of [phone] kcal/d;Understanding recommendations for meals to include 15-35% energy as protein, 25-35% energy from fat, 35-60% energy from carbohydrates, less than 200mg  of dietary cholesterol, 20-35 gm of total fiber daily;Understanding of distribution of calorie intake throughout the day with the consumption of 4-5 meals/snacks    Hypertension Yes    Intervention Provide education on lifestyle modifcations including regular physical activity/exercise, weight management, moderate sodium restriction and increased consumption of fresh fruit, vegetables, and low fat dairy, alcohol moderation, and smoking cessation.;Monitor prescription use compliance.    Expected  Outcomes Short Term: Continued assessment and intervention until BP is < 140/56mm HG in hypertensive participants. < 130/108mm HG in hypertensive participants with diabetes, heart failure or chronic kidney disease.;Long Term: Maintenance of blood pressure at goal levels.    Lipids Yes    Intervention Provide education and support for participant on nutrition & aerobic/resistive exercise along with prescribed medications to achieve LDL 70mg , HDL >40mg .    Expected Outcomes Short Term: Participant states understanding of desired cholesterol values  and is compliant with medications prescribed. Participant is following exercise prescription and nutrition guidelines.;Long Term: Cholesterol controlled with medications as prescribed, with individualized exercise RX and with personalized nutrition plan. Value goals: LDL < 70mg , HDL > 40 mg.          Education:Diabetes - Individual verbal and written instruction to review signs/symptoms of diabetes, desired ranges of glucose level fasting, after meals and with exercise. Acknowledge that pre and post exercise glucose checks will be done for 3 sessions at entry of program.   Core Components/Risk Factors/Patient Goals Review:    Core Components/Risk Factors/Patient Goals at Discharge (Final Review):    ITP Comments:  ITP Comments     Row Name 04/02/24 1114 04/03/24 1043         ITP Comments Initial phone call completed. Diagnosis can be found in [facility] 6/19. EP Orientation scheduled for Thursday 7/10 at 8:30. Completed and gym orientation for cardiac rehab. Initial ITP created and sent for review to Dr. Oneil Pinal, Medical Director.         Comments: Initial ITP

## 2024-04-03 NOTE — Progress Notes (Signed)
 Synopsis: Referred in by [name], MD   Subjective:   PATIENT ID: Heather [name] GENDER: female DOB: 12-Oct-1975, MRN: [id number]  Chief Complaint  Patient presents with   Follow-up    HPI Expand All Collapse All    @Patient  ID: Heather [name], female    DOB: 1976/05/12, 48 y.o.   MRN: [id number]      Chief Complaint  Patient presents with   Follow-up      Referring provider: [name], MD   HPI: 48 year old female former smoker followed for moderate persistent asthma Medical history significant for long-haul COVID 2021 Medical history significant for seizure disorder, MGUS, IBS, anxiety, ADHD GERD   TEST/EVENTS :  December 2021 IgE 55, allergy  panel positive for dust, dog and cat dander, grass and trees, absolute eosinophil count 100    PFT 04/2022 Pending unable to perform them given wheezing and chest tightness.    Chest x-ray January 2024 clear lungs Chest ray May 19, 2022 clear lungs  CTA chest 2025 with mild mosaic attenuation consistent with air trapping.    07/13/2023 Follow up ; Asthma  Patient presents for 1 year follow-up.  Patient has moderate persistent asthma.  Patient remains on Breztri  inhaler twice daily.  She remains on Zyrtec  and Singulair  daily since last visit patient says her breathing has been doing better with less cough and wheezing . Feels advair worked better than breztri .  Patient was set up for PFTs last visit but unfortunately have not been completed. No increased albuterol  /neb use .   Complains of 3-4 weeks of sinus congestion , drainage, scratchy throat, sinus pressure /teeth pain. Has been using OTC sinus meds without much help. Taking Zyrtec  , Claritin  and Flonase . Has not been taking Singulair  .      08/17/2023 Follow up, Asthma, post covid bronchitis.   12/20/2023 - Here for a follow up visit for asthma. Was doing well on Advair diskus 500 up until 2 weeks ago when she had a URI and her asthma flared up. Today in  office with multiple coughing spells. Unable to perform Feno.   04/03/2024 - Heather [name] is here to follow up regarding her asthma. She was in an exacerbation when I saw her last in March. Currently recovering well. Start on Dupixent  in April 2025. Discussed adding incruse ellipta  to her regimen and she is agreeable with that.   ROS All systems were reviewed and are negative except for the above.  Objective:   Vitals:   04/03/24 1522  BP: (!) 140/90  Pulse: 74  Temp: (!) 97.1 F (36.2 C)  SpO2: 98%  Weight: 188 lb (85.3 kg)  Height: 5' 7.4 (1.712 m)    98% on RA BMI Readings from Last 3 Encounters:  04/03/24 29.10 kg/m  04/03/24 29.70 kg/m  02/28/24 29.73 kg/m   Wt Readings from Last 3 Encounters:  04/03/24 188 lb (85.3 kg)  04/03/24 191 lb 14.4 oz (87 kg)  02/28/24 184 lb 3.2 oz (83.6 kg)    Physical Exam GEN: NAD, coughing consistently with audible wheezing HEENT: Supple Neck, Reactive Pupils, EOMI  CVS: Normal S1, Normal S2, RRR, No murmurs or ES appreciated  Lungs: Poor air movement with expiratory wheezing.  Abdomen: Soft, non tender, non distended, + BS  Extremities: Warm and well perfused, No edema  Skin: No suspicious lesions appreciated  Psych: Normal Affect  Ancillary Information   CBC    Component Value Date/Time   WBC 12.2 (H) 02/22/2024 0143  RBC 4.53 02/22/2024 0143   HGB 12.9 02/22/2024 0143   HGB 14.3 08/31/2023 1004   HGB 13.2 06/19/2014 1437   HCT 37.3 02/22/2024 0143   HCT 40.7 06/19/2014 1437   PLT 309 02/22/2024 0143   PLT 327 08/31/2023 1004   PLT 185 06/19/2014 1437   MCV 82.3 02/22/2024 0143   MCV 87 06/19/2014 1437   MCH 28.5 02/22/2024 0143   MCHC 34.6 02/22/2024 0143   RDW 14.3 02/22/2024 0143   RDW 14.2 06/19/2014 1437   LYMPHSABS 3.0 02/22/2024 0143   MONOABS 0.6 02/22/2024 0143   EOSABS 0.1 02/22/2024 0143   BASOSABS 0.1 02/22/2024 0143        No data to display           Assessment & Plan:   Severe  Persistent Asthma  Post COVID bronchitis. Acute sinusitis EOS - 400 Total Ige - 55  Allergen panel + for multiple Ags most notable Dog dander.   Relayed that this is acute bronchitis and will take time to improve around 3 to 4 weeks. Plan as below.    Plan  Patient Instructions  []  C/w advair diskus 500 1 puff bid. Can use as needed during the day as well during this flare. []  Prescribed airsupra  as needed.  []  Started on Dupixent  12/2023  []  C/w Singulair  10mg  At bedtime    []  Flonase  nasal 2 puffs BID and Ipratropium Nasal spray 1 spray each nostril bid. []  PFTs []    Start Incruse Ellipta  1 puff daily.   RTC 3 months   I spent 30 minutes caring for this patient today, including preparing to see the patient, obtaining a medical history , reviewing a separately obtained history, performing a medically appropriate examination and/or evaluation, counseling and educating the patient/family/caregiver, ordering medications, tests, or procedures, documenting clinical information in the electronic health record, and independently interpreting results (not separately reported/billed) and communicating results to the patient/family/caregiver  Darrin Barn, MD [facility] 04/03/2024 3:54 PM

## 2024-04-09 ENCOUNTER — Encounter

## 2024-04-09 DIAGNOSIS — I214 Non-ST elevation (NSTEMI) myocardial infarction: Secondary | ICD-10-CM

## 2024-04-09 DIAGNOSIS — Z955 Presence of coronary angioplasty implant and graft: Secondary | ICD-10-CM

## 2024-04-09 DIAGNOSIS — Z48812 Encounter for surgical aftercare following surgery on the circulatory system: Secondary | ICD-10-CM | POA: Diagnosis not present

## 2024-04-09 NOTE — Progress Notes (Signed)
 Daily Session Note  Patient Details  Name: Heather [name] MRN: [id number] Date of Birth: [birth year] Referring Provider:   Flowsheet Row Cardiac Rehab from 04/03/2024 in [facility] Cardiac and Pulmonary Rehab  Referring Provider Florencio Kava, MD    Encounter Date: 04/09/2024  Check In:  Session Check In - 04/09/24 1538       Check-In   Supervising physician immediately available to respond to emergencies See telemetry face sheet for immediately available ER MD    Location [facility]-Cardiac & Pulmonary Rehab    Staff Present Burnard Davenport RN,BSN,MPA;Joseph [facility] Dyane BS, ACSM CEP, Exercise Physiologist;Jason Elnor RDN,LDN    Virtual Visit No    Medication changes reported     No    Fall or balance concerns reported    No    Warm-up and Cool-down Performed on first and last piece of equipment    Resistance Training Performed Yes    VAD Patient? No    PAD/SET Patient? No      Pain Assessment   Currently in Pain? No/denies             Social History   Tobacco Use  Smoking Status Former   Current packs/day: 0.00   Average packs/day: 0.5 packs/day for 5.0 years (2.5 ttl pk-yrs)   Types: Cigarettes   Start date: 2008   Quit date: 2013   Years since quitting: 12.5  Smokeless Tobacco Never  Tobacco Comments   Quit mid October 2014    Goals Met:  Independence with exercise equipment Exercise tolerated well No report of concerns or symptoms today Strength training completed today  Goals Unmet:  Not Applicable  Comments: First full day of exercise!  Patient was oriented to gym and equipment including functions, settings, policies, and procedures.  Patient's individual exercise prescription and treatment plan were reviewed.  All starting workloads were established based on the results of the 6 minute walk test done at initial orientation visit.  The plan for exercise progression was also introduced and progression will be customized based on patient's  performance and goals.    Dr. Oneil Pinal is Medical Director for [facility] Cardiac Rehabilitation.  Dr. Fuad Aleskerov is Medical Director for [facility] Pulmonary Rehabilitation.

## 2024-04-14 ENCOUNTER — Encounter: Admitting: *Deleted

## 2024-04-14 ENCOUNTER — Ambulatory Visit (INDEPENDENT_AMBULATORY_CARE_PROVIDER_SITE_OTHER)

## 2024-04-14 DIAGNOSIS — E538 Deficiency of other specified B group vitamins: Secondary | ICD-10-CM

## 2024-04-14 DIAGNOSIS — I214 Non-ST elevation (NSTEMI) myocardial infarction: Secondary | ICD-10-CM

## 2024-04-14 DIAGNOSIS — Z955 Presence of coronary angioplasty implant and graft: Secondary | ICD-10-CM

## 2024-04-14 DIAGNOSIS — Z48812 Encounter for surgical aftercare following surgery on the circulatory system: Secondary | ICD-10-CM | POA: Diagnosis not present

## 2024-04-14 MED ORDER — CYANOCOBALAMIN 1000 MCG/ML IJ SOLN
1000.0000 ug | Freq: Once | INTRAMUSCULAR | Status: AC
  Administered 2024-04-14: 1000 ug via INTRAMUSCULAR

## 2024-04-14 NOTE — Progress Notes (Signed)
 Pt presented for their vitamin B12 injection. Pt was identified through two identifiers. Pt tolerated shot well in their right deltoid.

## 2024-04-14 NOTE — Progress Notes (Signed)
 Daily Session Note  Patient Details  Name: Heather [name] MRN: [id number] Date of Birth: May 30, 1976 Referring Provider:   Flowsheet Row Cardiac Rehab from 04/03/2024 in [facility] Cardiac and Pulmonary Rehab  Referring Provider Florencio Kava, MD    Encounter Date: 04/14/2024  Check In:  Session Check In - 04/14/24 1527       Check-In   Supervising physician immediately available to respond to emergencies See telemetry face sheet for immediately available ER MD    Location [facility]-Cardiac & Pulmonary Rehab    Staff Present Hoy Rodney RN,BSN;Joseph [facility] BS, Exercise Physiologist;Margaret Best, MS, Exercise Physiologist    Virtual Visit No    Medication changes reported     No    Fall or balance concerns reported    No    Warm-up and Cool-down Performed on first and last piece of equipment    Resistance Training Performed Yes    VAD Patient? No    PAD/SET Patient? No      Pain Assessment   Currently in Pain? No/denies             Social History   Tobacco Use  Smoking Status Former   Current packs/day: 0.00   Average packs/day: 0.5 packs/day for 5.0 years (2.5 ttl pk-yrs)   Types: Cigarettes   Start date: 2008   Quit date: 2013   Years since quitting: 12.5  Smokeless Tobacco Never  Tobacco Comments   Quit mid October 2014    Goals Met:  Independence with exercise equipment Exercise tolerated well No report of concerns or symptoms today Strength training completed today  Goals Unmet:  Not Applicable  Comments: Pt able to follow exercise prescription today without complaint.  Will continue to monitor for progression.    Dr. Oneil Pinal is Medical Director for [facility] Cardiac Rehabilitation.  Dr. Fuad Aleskerov is Medical Director for [facility] Pulmonary Rehabilitation.

## 2024-04-16 ENCOUNTER — Encounter: Admitting: *Deleted

## 2024-04-16 ENCOUNTER — Other Ambulatory Visit: Payer: Self-pay | Admitting: Pulmonary Disease

## 2024-04-16 DIAGNOSIS — J4550 Severe persistent asthma, uncomplicated: Secondary | ICD-10-CM

## 2024-04-16 DIAGNOSIS — Z955 Presence of coronary angioplasty implant and graft: Secondary | ICD-10-CM

## 2024-04-16 DIAGNOSIS — I214 Non-ST elevation (NSTEMI) myocardial infarction: Secondary | ICD-10-CM

## 2024-04-16 DIAGNOSIS — Z48812 Encounter for surgical aftercare following surgery on the circulatory system: Secondary | ICD-10-CM | POA: Diagnosis not present

## 2024-04-16 NOTE — Addendum Note (Signed)
 Addended by: DAYNE SHERRY RAMAN on: 04/16/2024 09:51 AM   Modules accepted: Level of Service

## 2024-04-16 NOTE — Progress Notes (Signed)
 Daily Session Note  Patient Details  Name: Heather [name] MRN: [id number] Date of Birth: 10-14-1975 Referring Provider:   Flowsheet Row Cardiac Rehab from 04/03/2024 in [facility] Cardiac and Pulmonary Rehab  Referring Provider Florencio Kava, MD    Encounter Date: 04/16/2024  Check In:  Session Check In - 04/16/24 1537       Check-In   Supervising physician immediately available to respond to emergencies See telemetry face sheet for immediately available ER MD    Location [facility]-Cardiac & Pulmonary Rehab    Staff Present Hoy Rodney RN,BSN;Joseph [facility] Dyane BS, ACSM CEP, Exercise Physiologist;Kelly Bollinger [facility]    Virtual Visit No    Medication changes reported     No    Fall or balance concerns reported    No    Warm-up and Cool-down Performed on first and last piece of equipment    Resistance Training Performed Yes    VAD Patient? No    PAD/SET Patient? No      Pain Assessment   Currently in Pain? No/denies             Social History   Tobacco Use  Smoking Status Former   Current packs/day: 0.00   Average packs/day: 0.5 packs/day for 5.0 years (2.5 ttl pk-yrs)   Types: Cigarettes   Start date: 2008   Quit date: 2013   Years since quitting: 12.5  Smokeless Tobacco Never  Tobacco Comments   Quit mid October 2014    Goals Met:  Independence with exercise equipment Exercise tolerated well No report of concerns or symptoms today Strength training completed today  Goals Unmet:  Not Applicable  Comments: Pt able to follow exercise prescription today without complaint.  Will continue to monitor for progression.    Dr. Oneil Pinal is Medical Director for [facility] Cardiac Rehabilitation.  Dr. Fuad Aleskerov is Medical Director for [facility] Pulmonary Rehabilitation.

## 2024-04-21 ENCOUNTER — Encounter: Admitting: *Deleted

## 2024-04-21 DIAGNOSIS — Z955 Presence of coronary angioplasty implant and graft: Secondary | ICD-10-CM

## 2024-04-21 DIAGNOSIS — I214 Non-ST elevation (NSTEMI) myocardial infarction: Secondary | ICD-10-CM

## 2024-04-21 DIAGNOSIS — Z48812 Encounter for surgical aftercare following surgery on the circulatory system: Secondary | ICD-10-CM | POA: Diagnosis not present

## 2024-04-21 NOTE — Progress Notes (Signed)
 Daily Session Note  Patient Details  Name: Heather [name] MRN: [id number] Date of Birth: 06-Jan-1976 Referring Provider:   Flowsheet Row Cardiac Rehab from 04/03/2024 in [facility] Cardiac and Pulmonary Rehab  Referring Provider Florencio Kava, MD    Encounter Date: 04/21/2024  Check In:  Session Check In - 04/21/24 1549       Check-In   Supervising physician immediately available to respond to emergencies See telemetry face sheet for immediately available ER MD    Location [facility]-Cardiac & Pulmonary Rehab    Staff Present Hoy Rodney RN,BSN;Joseph [facility] RCP,RRT,BSRT;Margaret Best, MS, Exercise Physiologist;Maxon Conetta BS, Exercise Physiologist    Virtual Visit No    Medication changes reported     No    Fall or balance concerns reported    No    Warm-up and Cool-down Performed on first and last piece of equipment    Resistance Training Performed Yes    VAD Patient? No    PAD/SET Patient? No      Pain Assessment   Currently in Pain? No/denies             Social History   Tobacco Use  Smoking Status Former   Current packs/day: 0.00   Average packs/day: 0.5 packs/day for 5.0 years (2.5 ttl pk-yrs)   Types: Cigarettes   Start date: 2008   Quit date: 2013   Years since quitting: 12.5  Smokeless Tobacco Never  Tobacco Comments   Quit mid October 2014    Goals Met:  Independence with exercise equipment Exercise tolerated well No report of concerns or symptoms today Strength training completed today  Goals Unmet:  Not Applicable  Comments: Pt able to follow exercise prescription today without complaint.  Will continue to monitor for progression.    Dr. Oneil Pinal is Medical Director for [facility] Cardiac Rehabilitation.  Dr. Fuad Aleskerov is Medical Director for [facility] Pulmonary Rehabilitation.

## 2024-04-23 ENCOUNTER — Encounter: Admitting: *Deleted

## 2024-04-23 DIAGNOSIS — I214 Non-ST elevation (NSTEMI) myocardial infarction: Secondary | ICD-10-CM

## 2024-04-23 DIAGNOSIS — Z48812 Encounter for surgical aftercare following surgery on the circulatory system: Secondary | ICD-10-CM | POA: Diagnosis not present

## 2024-04-23 DIAGNOSIS — Z955 Presence of coronary angioplasty implant and graft: Secondary | ICD-10-CM

## 2024-04-23 NOTE — Progress Notes (Signed)
 Cardiac Individual Treatment Plan  Patient Details  Name: Heather [name] MRN: [id number] Date of Birth: [birth year] Referring Provider:   Flowsheet Row Cardiac Rehab from 04/03/2024 in [facility] Cardiac and Pulmonary Rehab  Referring Provider Florencio Kava, MD    Initial Encounter Date:  Flowsheet Row Cardiac Rehab from 04/03/2024 in [facility] Cardiac and Pulmonary Rehab  Date 04/03/24    Visit Diagnosis: NSTEMI (non-ST elevated myocardial infarction) [facility])  Status post coronary artery stent placement  Patient's Home Medications on Admission:  Current Outpatient Medications:    AJOVY 225 MG/1.5ML SOAJ, Inject 225 mg into the skin every 30 (thirty) days. (Patient not taking: Reported on 04/03/2024), Disp: , Rfl:    albuterol  (VENTOLIN  HFA) 108 (90 Base) MCG/ACT inhaler, INHALE 1 TO 2 PUFFS BY MOUTH EVERY 4 HOURS AS NEEDED FOR WHEEZING FOR SHORTNESS OF BREATH, Disp: 18 g, Rfl: 1   Albuterol -Budesonide  (AIRSUPRA ) 90-80 MCG/ACT AERO, INHALE 2 PUFFS EVERY 6 HOURS, Disp: 11 g, Rfl: 6   [Paused] amphetamine -dextroamphetamine  (ADDERALL XR) 20 MG 24 hr capsule, Take 20 mg by mouth every morning. (Patient not taking: Reported on 04/03/2024), Disp: , Rfl:    ascorbic acid (VITAMIN C) 1000 MG tablet, Take 500 mg by mouth daily., Disp: , Rfl:    aspirin  EC 81 MG tablet, Take 1 tablet (81 mg total) by mouth daily. Swallow whole., Disp: , Rfl:    benzonatate  (TESSALON ) 100 MG capsule, Take 1 capsule (100 mg total) by mouth every 8 (eight) hours. (Patient taking differently: Take 100 mg by mouth every 8 (eight) hours as needed for cough.), Disp: 21 capsule, Rfl: 0   cetirizine  (ZYRTEC ) 10 MG tablet, Take 1 tablet by mouth once daily, Disp: 90 tablet, Rfl: 3   cyanocobalamin  (VITAMIN B12) 1000 MCG/ML injection, Inject 1,000 mcg into the muscle every 30 (thirty) days., Disp: , Rfl:    diazepam  (VALIUM ) 10 MG tablet, Take 1 tablet by mouth 4 (four) times daily as needed., Disp: , Rfl:    divalproex  (DEPAKOTE  ER) 500  MG 24 hr tablet, Take 1,500 mg by mouth at bedtime., Disp: , Rfl:    Dupilumab  (DUPIXENT ) 300 MG/2ML SOAJ, Inject 300 mg into the skin every 14 (fourteen) days. **loading dose completed in clinic on 01/04/2024**, Disp: 12 mL, Rfl: 1   ezetimibe (ZETIA) 10 MG tablet, Take 1 tablet by mouth daily., Disp: , Rfl:    fluticasone  (FLONASE ) 50 MCG/ACT nasal spray, Place 1 spray into both nostrils daily., Disp: 48 g, Rfl: 1   fluticasone -salmeterol (ADVAIR) 500-50 MCG/ACT AEPB, Inhale 1 puff into the lungs in the morning and at bedtime., Disp: 60 each, Rfl: 11   heparin  25000 UT/250ML infusion, Inject 1,350 Units/hr into the vein continuous., Disp: , Rfl:    ipratropium (ATROVENT ) 0.03 % nasal spray, Place 2 sprays into both nostrils every 12 (twelve) hours., Disp: 30 mL, Rfl: 12   ipratropium-albuterol  (DUONEB) 0.5-2.5 (3) MG/3ML SOLN, Inhale 3 mLs into the lungs every 4 (four) hours as needed., Disp: 360 mL, Rfl: 11   isosorbide mononitrate (IMDUR) 30 MG 24 hr tablet, Take 30 mg by mouth daily., Disp: , Rfl:    linaclotide  (LINZESS ) 290 MCG CAPS capsule, Take 1 capsule (290 mcg total) by mouth daily before breakfast. (Patient taking differently: Take 290 mcg by mouth daily as needed.), Disp: 30 capsule, Rfl: 1   losartan (COZAAR) 25 MG tablet, Take 25 mg by mouth daily., Disp: , Rfl:    metoprolol succinate (TOPROL-XL) 25 MG 24 hr tablet, Take  1 tablet by mouth daily., Disp: , Rfl:    montelukast  (SINGULAIR ) 10 MG tablet, Take 1 tablet (10 mg total) by mouth at bedtime., Disp: 30 tablet, Rfl: 11   nitroGLYCERIN  (NITROSTAT ) 0.4 MG SL tablet, Place 0.4 mg under the tongue every 5 (five) minutes as needed., Disp: , Rfl:    nitroGLYCERIN  0.2 mg/mL infusion, Inject 0-200 mcg/min into the vein continuous., Disp: , Rfl:    omeprazole  (PRILOSEC) 40 MG capsule, Take 1 capsule (40 mg total) by mouth daily before breakfast., Disp: 30 capsule, Rfl: 3   ondansetron  (ZOFRAN -ODT) 4 MG disintegrating tablet, DISSOLVE 1  TABLET IN MOUTH TWICE DAILY AS NEEDED FOR NAUSEA AND  VOMITING, Disp: 20 tablet, Rfl: 0   prasugrel (EFFIENT) 10 MG TABS tablet, Take 1 tablet by mouth daily., Disp: , Rfl:    Rimegepant Sulfate  (NURTEC) 75 MG TBDP, Take 75 mg by mouth daily as needed. (Patient not taking: Reported on 04/03/2024), Disp: , Rfl:    rosuvastatin (CRESTOR) 40 MG tablet, Take 40 mg by mouth daily., Disp: , Rfl:    umeclidinium bromide  (INCRUSE ELLIPTA ) 62.5 MCG/ACT AEPB, Inhale 1 puff into the lungs daily., Disp: 3 each, Rfl: 3  Past Medical History: 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 hauler    Depression    bipolar   Dysrhythmia    Fainting spell    H/O   H/O cardiac arrhythmia    Migraines    Seizures (HCC)     Tobacco Use: Social History   Tobacco Use  Smoking Status Former   Current packs/day: 0.00   Average packs/day: 0.5 packs/day for 5.0 years (2.5 ttl pk-yrs)   Types: Cigarettes   Start date: 2008   Quit date: 2013   Years since quitting: 12.5  Smokeless Tobacco Never  Tobacco Comments   Quit mid October 2014    Labs: Review Flowsheet  More data exists      Latest Ref Rng & Units 05/18/2021 08/29/2021 07/10/2022 11/07/2023 02/21/2024  Labs for ITP Cardiac and Pulmonary Rehab  Cholestrol 0 - 200 mg/dL 829  793  792  800  812   LDL (calc) 0 - 99 mg/dL 889  865  - 898  893   Direct LDL mg/dL - - 863.9  - -  HDL-C >59 mg/dL 60.89  60.79  63.39  61.39  35   Trlycerides <150 mg/dL 894.9  835.9  649.9  705.9  230   Hemoglobin A1c 4.8 - 5.6 % - - - - 4.7      Exercise Target Goals: Exercise Program Goal: Individual exercise prescription set using results from initial 6 min walk test and THRR while considering  patient's activity barriers and safety.   Exercise Prescription Goal: Initial exercise prescription builds to 30-45 minutes a day of aerobic activity, 2-3 days per week.  Home exercise guidelines will be given to patient during program as part of  exercise prescription that the participant will acknowledge.   Education: Aerobic Exercise: - Group verbal and visual presentation on the components of exercise prescription. Introduces F.I.T.T principle from ACSM for exercise prescriptions.  Reviews F.I.T.T. principles of aerobic exercise including progression. Written material given at graduation.   Education: Resistance Exercise: - Group verbal and visual presentation on the components of exercise prescription. Introduces F.I.T.T principle from ACSM for exercise prescriptions  Reviews F.I.T.T. principles of resistance exercise including progression. Written material given at graduation.    Education: Exercise & Equipment  Safety: - Individual verbal instruction and demonstration of equipment use and safety with use of the equipment. Flowsheet Row Cardiac Rehab from 04/16/2024 in [facility] Cardiac and Pulmonary Rehab  Date 04/03/24  Educator MB  Instruction Review Code 1- Verbalizes Understanding    Education: Exercise Physiology & General Exercise Guidelines: - Group verbal and written instruction with models to review the exercise physiology of the cardiovascular system and associated critical values. Provides general exercise guidelines with specific guidelines to those with heart or lung disease.  Flowsheet Row Cardiac Rehab from 04/16/2024 in [facility] Cardiac and Pulmonary Rehab  Date 04/16/24  Educator [facility]  Instruction Review Code 1- [employer] Understanding    Education: Flexibility, Balance, Mind/Body Relaxation: - Group verbal and visual presentation with interactive activity on the components of exercise prescription. Introduces F.I.T.T principle from ACSM for exercise prescriptions. Reviews F.I.T.T. principles of flexibility and balance exercise training including progression. Also discusses the mind body connection.  Reviews various relaxation techniques to help reduce and manage stress (i.e. Deep breathing, progressive muscle  relaxation, and visualization). Balance handout provided to take home. Written material given at graduation.   Activity Barriers & Risk Stratification:  Activity Barriers & Cardiac Risk Stratification - 04/03/24 1045       Activity Barriers & Cardiac Risk Stratification   Activity Barriers Joint Problems;Back Problems;Other (comment);Balance Concerns    Comments Long Covid- lower extremity issues (aches/hurts/impacts balance sometimes)    Cardiac Risk Stratification Moderate          6 Minute Walk:  6 Minute Walk     Row Name 04/03/24 1043         6 Minute Walk   Phase Initial     Distance 965 feet     Walk Time 6 minutes     # of Rest Breaks 0     MPH 1.83     METS 3.42     RPE 11     Perceived Dyspnea  1     VO2 Peak 11.98     Symptoms Yes (comment)     Comments aching legs and fatigue (used railing some)     Resting HR 66 bpm     Resting BP 144/90     Resting Oxygen Saturation  97 %     Exercise Oxygen Saturation  during 6 min walk 97 %     Max Ex. HR 90 bpm     Max Ex. BP 150/88     2 Minute Post BP 130/88        Oxygen Initial Assessment:   Oxygen Re-Evaluation:   Oxygen Discharge (Final Oxygen Re-Evaluation):   Initial Exercise Prescription:  Initial Exercise Prescription - 04/03/24 1000       Date of Initial Exercise RX and Referring Provider   Date 04/03/24    Referring Provider Florencio Kava, MD      Oxygen   Maintain Oxygen Saturation 88% or higher      Treadmill   MPH 1.8   Try   Grade 0    Minutes 15    METs 2.38      Recumbant Bike   Level 3    RPM 50    Watts 25    Minutes 15    METs 3.42      NuStep   Level 3    SPM 80    Minutes 15    METs 3.42      T5 Nustep   Level 3    SPM  80    Minutes 15    METs 3.42      Track   Laps 23    Minutes 15    METs 2.25      Prescription Details   Frequency (times per week) 3    Duration Progress to 30 minutes of continuous aerobic without signs/symptoms of physical  distress      Intensity   THRR 40-80% of Max Heartrate 108-150    Ratings of Perceived Exertion 11-13    Perceived Dyspnea 0-4      Progression   Progression Continue to progress workloads to maintain intensity without signs/symptoms of physical distress.      Resistance Training   Training Prescription Yes    Weight 5 lb    Reps 10-15          Perform Capillary Blood Glucose checks as needed.  Exercise Prescription Changes:   Exercise Prescription Changes     Row Name 04/03/24 1000 04/15/24 1400           Response to Exercise   Blood Pressure (Admit) 144/90 122/80      Blood Pressure (Exercise) 150/88 142/76      Blood Pressure (Exit) 130/88 110/80      Heart Rate (Admit) 66 bpm 81 bpm      Heart Rate (Exercise) 90 bpm 122 bpm      Heart Rate (Exit) 72 bpm 60 bpm      Oxygen Saturation (Admit) 97 % --      Oxygen Saturation (Exercise) 97 % --      Oxygen Saturation (Exit) 96 % --      Rating of Perceived Exertion (Exercise) 11 15      Perceived Dyspnea (Exercise) 1 --      Symptoms aching legs and fatigue none      Comments results 1st day of exercise      Duration -- Progress to 30 minutes of  aerobic without signs/symptoms of physical distress      Intensity THRR New THRR unchanged        Progression   Progression -- Continue to progress workloads to maintain intensity without signs/symptoms of physical distress.      Average METs 3.42 3.17        Resistance Training   Training Prescription -- Yes      Weight -- 5 lb      Reps -- 10-15        Interval Training   Interval Training -- No        Treadmill   MPH -- 3.3      Grade -- 0      Minutes -- 15      METs -- 3.53        NuStep   Level -- 3      Minutes -- 15      METs -- 2.8        Oxygen   Maintain Oxygen Saturation -- 88% or higher         Exercise Comments:   Exercise Comments     Row Name 04/09/24 1539           Exercise Comments First full day of exercise!  Patient  was oriented to gym and equipment including functions, settings, policies, and procedures.  Patient's individual exercise prescription and treatment plan were reviewed.  All starting workloads were established based on the results of the 6 minute walk test done at initial orientation visit.  The plan for exercise progression was also introduced and progression will be customized based on patient's performance and goals.          Exercise Goals and Review:   Exercise Goals     Row Name 04/03/24 1048             Exercise Goals   Increase Physical Activity Yes       Intervention Provide advice, education, support and counseling about physical activity/exercise needs.;Develop an individualized exercise prescription for aerobic and resistive training based on initial evaluation findings, risk stratification, comorbidities and participant's personal goals.       Expected Outcomes Short Term: Attend rehab on a regular basis to increase amount of physical activity.;Long Term: Add in home exercise to make exercise part of routine and to increase amount of physical activity.;Long Term: Exercising regularly at least 3-5 days a week.       Increase Strength and Stamina Yes       Intervention Provide advice, education, support and counseling about physical activity/exercise needs.;Develop an individualized exercise prescription for aerobic and resistive training based on initial evaluation findings, risk stratification, comorbidities and participant's personal goals.       Expected Outcomes Short Term: Increase workloads from initial exercise prescription for resistance, speed, and METs.;Short Term: Perform resistance training exercises routinely during rehab and add in resistance training at home;Long Term: Improve cardiorespiratory fitness, muscular endurance and strength as measured by increased METs and functional capacity ( )       Able to understand and use rate of perceived exertion (RPE) scale Yes        Intervention Provide education and explanation on how to use RPE scale       Expected Outcomes Short Term: Able to use RPE daily in rehab to express subjective intensity level;Long Term:  Able to use RPE to guide intensity level when exercising independently       Able to understand and use Dyspnea scale Yes       Intervention Provide education and explanation on how to use Dyspnea scale       Expected Outcomes Short Term: Able to use Dyspnea scale daily in rehab to express subjective sense of shortness of breath during exertion;Long Term: Able to use Dyspnea scale to guide intensity level when exercising independently       Knowledge and understanding of Target Heart Rate Range (THRR) Yes       Intervention Provide education and explanation of THRR including how the numbers were predicted and where they are located for reference       Expected Outcomes Short Term: Able to state/look up THRR;Short Term: Able to use daily as guideline for intensity in rehab;Long Term: Able to use THRR to govern intensity when exercising independently       Able to check pulse independently Yes       Intervention Provide education and demonstration on how to check pulse in carotid and radial arteries.;Review the importance of being able to check your own pulse for safety during independent exercise       Expected Outcomes Short Term: Able to explain why pulse checking is important during independent exercise;Long Term: Able to check pulse independently and accurately       Understanding of Exercise Prescription Yes       Intervention Provide education, explanation, and written materials on patient's individual exercise prescription       Expected Outcomes Short Term: Able to explain program exercise prescription;Long Term: Able to  explain home exercise prescription to exercise independently          Exercise Goals Re-Evaluation :  Exercise Goals Re-Evaluation     Row Name 04/09/24 1539 04/15/24 1448            Exercise Goal Re-Evaluation   Exercise Goals Review Increase Physical Activity;Able to understand and use rate of perceived exertion (RPE) scale;Knowledge and understanding of Target Heart Rate Range (THRR);Understanding of Exercise Prescription;Increase Strength and Stamina;Able to understand and use Dyspnea scale;Able to check pulse independently Increase Physical Activity;Understanding of Exercise Prescription;Increase Strength and Stamina      Comments Reviewed RPE and dyspnea scale, THR and program prescription with pt today.  Pt voiced understanding and was given a copy of goals to take home. Catheleen is off to a good start in the program and completed her first day in this review. She worked at level 3 on the T4 nustep. She was able to do the treadmill and worked at a speed of 3.3 mph with no incline. We will continue to monitor her progress in the program.      Expected Outcomes Short: Use RPE daily to regulate intensity. Long: Follow program prescription in THR. Short: Continue to follow current exercise prescription. Long: Continue exercise to improve strength and stamina.         Discharge Exercise Prescription (Final Exercise Prescription Changes):  Exercise Prescription Changes - 04/15/24 1400       Response to Exercise   Blood Pressure (Admit) 122/80    Blood Pressure (Exercise) 142/76    Blood Pressure (Exit) 110/80    Heart Rate (Admit) 81 bpm    Heart Rate (Exercise) 122 bpm    Heart Rate (Exit) 60 bpm    Rating of Perceived Exertion (Exercise) 15    Symptoms none    Comments 1st day of exercise    Duration Progress to 30 minutes of  aerobic without signs/symptoms of physical distress    Intensity THRR unchanged      Progression   Progression Continue to progress workloads to maintain intensity without signs/symptoms of physical distress.    Average METs 3.17      Resistance Training   Training Prescription Yes    Weight 5 lb    Reps 10-15      Interval Training    Interval Training No      Treadmill   MPH 3.3    Grade 0    Minutes 15    METs 3.53      NuStep   Level 3    Minutes 15    METs 2.8      Oxygen   Maintain Oxygen Saturation 88% or higher          Nutrition:  Target Goals: Understanding of nutrition guidelines, daily intake of sodium 1500mg , cholesterol 200mg , calories 30% from fat and 7% or less from saturated fats, daily to have 5 or more servings of fruits and vegetables.  Education: All About Nutrition: -Group instruction provided by verbal, written material, interactive activities, discussions, models, and posters to present general guidelines for heart healthy nutrition including fat, fiber, MyPlate, the role of sodium in heart healthy nutrition, utilization of the nutrition label, and utilization of this knowledge for meal planning. Follow up email sent as well. Written material given at graduation.   Biometrics:  Pre Biometrics - 04/03/24 1048       Pre Biometrics   Height 5' 7.4 (1.712 m)    Weight 191 lb  14.4 oz (87 kg)    Waist Circumference 41.9 inches    Hip Circumference 47 inches    Waist to Hip Ratio 0.89 %    BMI (Calculated) 29.7    Single Leg Stand 23 seconds           Nutrition Therapy Plan and Nutrition Goals:  Nutrition Therapy & Goals - 04/03/24 1049       Nutrition Therapy   RD appointment deferred Yes      Personal Nutrition Goals   Nutrition Goal RD appointment deferred at this time, will think about maybe scheduling in the future      Intervention Plan   Intervention Prescribe, educate and counsel regarding individualized specific dietary modifications aiming towards targeted core components such as weight, hypertension, lipid management, diabetes, heart failure and other comorbidities.    Expected Outcomes Short Term Goal: Understand basic principles of dietary content, such as calories, fat, sodium, cholesterol and nutrients.          Nutrition Assessments:  MEDIFICTS  Score Key: >=70 Need to make dietary changes  40-70 Heart Healthy Diet <= 40 Therapeutic Level Cholesterol Diet  Flowsheet Row Cardiac Rehab from 04/02/2024 in [facility] Cardiac and Pulmonary Rehab  Picture Your Plate Total Score on Admission 59   Picture Your Plate Scores: <59 Unhealthy dietary pattern with much room for improvement. 41-50 Dietary pattern unlikely to meet recommendations for good health and room for improvement. 51-60 More healthful dietary pattern, with some room for improvement.  >60 Healthy dietary pattern, although there may be some specific behaviors that could be improved.    Nutrition Goals Re-Evaluation:   Nutrition Goals Discharge (Final Nutrition Goals Re-Evaluation):   Psychosocial: Target Goals: Acknowledge presence or absence of significant depression and/or stress, maximize coping skills, provide positive support system. Participant is able to verbalize types and ability to use techniques and skills needed for reducing stress and depression.   Education: Stress, Anxiety, and Depression - Group verbal and visual presentation to define topics covered.  Reviews how body is impacted by stress, anxiety, and depression.  Also discusses healthy ways to reduce stress and to treat/manage anxiety and depression.  Written material given at graduation. Flowsheet Row Cardiac Rehab from 04/16/2024 in [facility] Cardiac and Pulmonary Rehab  Date 04/09/24  Educator mc  Instruction Review Code 1- Verbalizes Understanding    Education: Sleep Hygiene -Provides group verbal and written instruction about how sleep can affect your health.  Define sleep hygiene, discuss sleep cycles and impact of sleep habits. Review good sleep hygiene tips.    Initial Review & Psychosocial Screening:  Initial Psych Review & Screening - 04/02/24 1117       Initial Review   Current issues with Current Stress Concerns;Current Psychotropic Meds    Source of Stress Concerns Chronic Illness;Unable to  participate in former interests or hobbies;Unable to perform yard/household activities      Barriers   Psychosocial barriers to participate in program There are no identifiable barriers or psychosocial needs.      Screening Interventions   Interventions Encouraged to exercise;To provide support and resources with identified psychosocial needs;Provide feedback about the scores to participant    Expected Outcomes Short Term goal: Utilizing psychosocial counselor, staff and physician to assist with identification of specific Stressors or current issues interfering with healing process. Setting desired goal for each stressor or current issue identified.;Long Term Goal: Stressors or current issues are controlled or eliminated.;Short Term goal: Identification and review with participant of any Quality  of Life or Depression concerns found by scoring the questionnaire.;Long Term goal: The participant improves quality of Life and PHQ9 Scores as seen by post scores and/or verbalization of changes          Quality of Life Scores:   Scores of 19 and below usually indicate a poorer quality of life in these areas.  A difference of  2-3 points is a clinically meaningful difference.  A difference of 2-3 points in the total score of the Quality of Life Index has been associated with significant improvement in overall quality of life, self-image, physical symptoms, and general health in studies assessing change in quality of life.  PHQ-9: Review Flowsheet  More data exists      04/03/2024 04/27/2023 10/26/2022 07/19/2022 06/09/2022  Depression screen PHQ 2/9  Decreased Interest 3 1 3  0 1  Down, Depressed, Hopeless 1 1 0 0 0  PHQ - 2 Score 4 2 3  0 1  Altered sleeping 3 - 0 - -  Tired, decreased energy 3 0 3 - -  Change in appetite 0 0 0 - -  Feeling bad or failure about yourself  1 1 1  - -  Trouble concentrating 3 1 2  - -  Moving slowly or fidgety/restless 1 0 0 - -  Suicidal thoughts 0 0 0 - -  PHQ-9 Score  15 - 9 - -  Difficult doing work/chores Extremely dIfficult Extremely dIfficult Extremely dIfficult - -   Interpretation of Total Score  Total Score Depression Severity:  1-4 = Minimal depression, 5-9 = Mild depression, 10-14 = Moderate depression, 15-19 = Moderately severe depression, 20-27 = Severe depression   Psychosocial Evaluation and Intervention:  Psychosocial Evaluation - 04/02/24 1118       Psychosocial Evaluation & Interventions   Interventions Encouraged to exercise with the program and follow exercise prescription;Stress management education;Relaxation education    Comments Shelle is coming to cardiac rehab 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 She is meeting with therapists to help with her long covid symptoms. She is on mood stabilizer medication as well to help with symptoms. She is wary of attending the program because she doesn't know if she will be able to finish it, but she wants to learn as much as possible. She is on long term disability from Covid because if she is very active, it takes her 2-3 days to recover. She is learning to take breaks and pace herself. She is being followed with the long covid clinic at [facility] which has been beneficial for her.    Expected Outcomes Short: attend cardiac rehab for education and exercise Long: develop and maintain positive self care habits    Continue Psychosocial Services  Follow up required by staff          Psychosocial Re-Evaluation:   Psychosocial Discharge (Final Psychosocial Re-Evaluation):   Vocational Rehabilitation: Provide vocational rehab assistance to qualifying candidates.   Vocational Rehab Evaluation & Intervention:  Vocational Rehab - 04/02/24 1117       Initial Vocational Rehab Evaluation & Intervention   Assessment shows need for Vocational Rehabilitation No           Education: Education Goals: Education classes will be provided on a variety of topics geared toward better understanding of heart health and risk factor modification. Participant will state understanding/return demonstration of topics presented as noted by education test scores.  Learning Barriers/Preferences:  Learning Barriers/Preferences - 04/02/24 1116       Learning Barriers/Preferences   Learning Barriers Exercise Concerns   Long Covid- brain fog   Learning Preferences Individual Instruction          General Cardiac Education Topics:  AED/CPR: - Group verbal and written instruction with the use of models to demonstrate the basic use of the AED with the basic ABC's of resuscitation.   Anatomy and Cardiac Procedures: - Group verbal and visual presentation and models provide information about basic cardiac anatomy and function. Reviews the testing methods done to diagnose heart disease and the outcomes of the test results. Describes the treatment choices: Medical Management, Angioplasty, or Coronary Bypass Surgery for treating various heart conditions including Myocardial Infarction, Angina, Valve Disease, and Cardiac Arrhythmias.  Written material given at graduation.   Medication Safety: - Group verbal and visual instruction to review commonly prescribed medications for heart and lung disease. Reviews the medication, class of the drug, and side effects. Includes the steps to properly store meds and maintain the prescription regimen.  Written material given at graduation.   Intimacy: - Group verbal instruction through game format to discuss how heart and lung disease can affect sexual intimacy. Written material given at graduation..   Know Your Numbers and Heart Failure: - Group verbal and visual instruction to discuss disease risk factors for cardiac and pulmonary disease and treatment options.  Reviews associated critical values for Overweight/Obesity, Hypertension,  Cholesterol, and Diabetes.  Discusses basics of heart failure: signs/symptoms and treatments.  Introduces Heart Failure Zone chart for action plan for heart failure.  Written material given at graduation.   Infection Prevention: - Provides verbal and written material to individual with discussion of infection control including proper hand washing and proper equipment cleaning during exercise session. Flowsheet Row Cardiac Rehab from 04/16/2024 in [facility] Cardiac and Pulmonary Rehab  Date 04/03/24  Educator MB  Instruction Review Code 1- Verbalizes Understanding    Falls Prevention: - Provides verbal and written material to individual with discussion of falls prevention and safety. Flowsheet Row Cardiac Rehab from 04/16/2024 in [facility] Cardiac and Pulmonary Rehab  Date 04/03/24  Educator MB  Instruction Review Code 1- Verbalizes Understanding    Other: -Provides group and verbal instruction on various topics (see comments)   Knowledge Questionnaire Score:   Core Components/Risk Factors/Patient Goals at Admission:  Personal Goals and Risk Factors at Admission - 04/03/24 1049       Core Components/Risk Factors/Patient Goals on Admission    Weight Management Yes;Weight Loss    Intervention Weight Management: Develop a combined nutrition and exercise program designed to reach desired caloric intake, while maintaining appropriate intake of nutrient and fiber, sodium and fats, and appropriate energy expenditure required for the weight goal.;Weight Management: Provide education and appropriate resources to help participant work on and attain dietary goals.;Weight Management/Obesity: Establish reasonable short term and long term weight goals.    Admit Weight 191 lb 14.4 oz (87 kg)    Goal Weight: Short Term 169 lb 8 oz (76.9 kg)    Goal Weight: Long Term 148 lb (67.1 kg)    Expected Outcomes Short Term: Continue to assess and modify interventions until short term weight is achieved;Long Term:  Adherence to nutrition and physical activity/exercise program aimed toward attainment of established weight goal;Weight Loss: Understanding of general recommendations for a balanced deficit meal plan, which promotes 1-2 lb weight loss per week and includes a negative energy balance of [phone] kcal/d;Understanding  recommendations for meals to include 15-35% energy as protein, 25-35% energy from fat, 35-60% energy from carbohydrates, less than 200mg  of dietary cholesterol, 20-35 gm of total fiber daily;Understanding of distribution of calorie intake throughout the day with the consumption of 4-5 meals/snacks    Hypertension Yes    Intervention Provide education on lifestyle modifcations including regular physical activity/exercise, weight management, moderate sodium restriction and increased consumption of fresh fruit, vegetables, and low fat dairy, alcohol moderation, and smoking cessation.;Monitor prescription use compliance.    Expected Outcomes Short Term: Continued assessment and intervention until BP is < 140/37mm HG in hypertensive participants. < 130/56mm HG in hypertensive participants with diabetes, heart failure or chronic kidney disease.;Long Term: Maintenance of blood pressure at goal levels.    Lipids Yes    Intervention Provide education and support for participant on nutrition & aerobic/resistive exercise along with prescribed medications to achieve LDL 70mg , HDL >40mg .    Expected Outcomes Short Term: Participant states understanding of desired cholesterol values and is compliant with medications prescribed. Participant is following exercise prescription and nutrition guidelines.;Long Term: Cholesterol controlled with medications as prescribed, with individualized exercise RX and with personalized nutrition plan. Value goals: LDL < 70mg , HDL > 40 mg.          Education:Diabetes - Individual verbal and written instruction to review signs/symptoms of diabetes, desired ranges of glucose  level fasting, after meals and with exercise. Acknowledge that pre and post exercise glucose checks will be done for 3 sessions at entry of program.   Core Components/Risk Factors/Patient Goals Review:    Core Components/Risk Factors/Patient Goals at Discharge (Final Review):    ITP Comments:  ITP Comments     Row Name 04/02/24 1114 04/03/24 1043 04/09/24 1539 04/23/24 0943     ITP Comments Initial phone call completed. Diagnosis can be found in [facility] 6/19. EP Orientation scheduled for Thursday 7/10 at 8:30. Completed and gym orientation for cardiac rehab. Initial ITP created and sent for review to Dr. Oneil Pinal, Medical Director. First full day of exercise!  Patient was oriented to gym and equipment including functions, settings, policies, and procedures.  Patient's individual exercise prescription and treatment plan were reviewed.  All starting workloads were established based on the results of the 6 minute walk test done at initial orientation visit.  The plan for exercise progression was also introduced and progression will be customized based on patient's performance and goals. 30 Day review completed. Medical Director ITP review done, changes made as directed, and signed approval by Medical Director. New to program.       Comments: 30 day review

## 2024-04-23 NOTE — Progress Notes (Signed)
 Daily Session Note  Patient Details  Name: HAYLEEN CLINKSCALES MRN: [id number] Date of Birth: 21-Oct-1975 Referring Provider:   Flowsheet Row Cardiac Rehab from 04/03/2024 in [facility] Cardiac and Pulmonary Rehab  Referring Provider Florencio Kava, MD    Encounter Date: 04/23/2024  Check In:  Session Check In - 04/23/24 1625       Check-In   Supervising physician immediately available to respond to emergencies See telemetry face sheet for immediately available ER MD    Location [facility]-Cardiac & Pulmonary Rehab    Staff Present Hoy Rodney RN,BSN;Joseph [facility] Dyane BS, ACSM CEP, Exercise Physiologist;Kelly Bollinger [facility]    Virtual Visit No    Medication changes reported     No    Fall or balance concerns reported    No    Warm-up and Cool-down Performed on first and last piece of equipment    Resistance Training Performed Yes    VAD Patient? No    PAD/SET Patient? No      Pain Assessment   Currently in Pain? No/denies             Social History   Tobacco Use  Smoking Status Former   Current packs/day: 0.00   Average packs/day: 0.5 packs/day for 5.0 years (2.5 ttl pk-yrs)   Types: Cigarettes   Start date: 2008   Quit date: 2013   Years since quitting: 12.5  Smokeless Tobacco Never  Tobacco Comments   Quit mid October 2014    Goals Met:  Independence with exercise equipment Exercise tolerated well No report of concerns or symptoms today Strength training completed today  Goals Unmet:  Not Applicable  Comments: Pt able to follow exercise prescription today without complaint.  Will continue to monitor for progression.    Dr. Oneil Pinal is Medical Director for [facility] Cardiac Rehabilitation.  Dr. Fuad Aleskerov is Medical Director for [facility] Pulmonary Rehabilitation.

## 2024-04-28 ENCOUNTER — Encounter

## 2024-04-30 ENCOUNTER — Encounter

## 2024-05-05 ENCOUNTER — Encounter: Attending: Internal Medicine | Admitting: *Deleted

## 2024-05-05 DIAGNOSIS — Z955 Presence of coronary angioplasty implant and graft: Secondary | ICD-10-CM | POA: Insufficient documentation

## 2024-05-05 DIAGNOSIS — I214 Non-ST elevation (NSTEMI) myocardial infarction: Secondary | ICD-10-CM | POA: Insufficient documentation

## 2024-05-05 NOTE — Progress Notes (Signed)
 Daily Session Note  Patient Details  Name: EVOLETTE PENDELL MRN: [id number] Date of Birth: [birth year] Referring Provider:   Flowsheet Row Cardiac Rehab from 04/03/2024 in [facility] Cardiac and Pulmonary Rehab  Referring Provider Florencio Kava, MD    Encounter Date: 05/05/2024  Check In:  Session Check In - 05/05/24 1554       Check-In   Supervising physician immediately available to respond to emergencies See telemetry face sheet for immediately available ER MD    Location [facility]-Cardiac & Pulmonary Rehab    Staff Present Hoy Rodney RN,BSN;Joseph [facility] RCP,RRT,BSRT;Margaret Best, MS, Exercise Physiologist;Maxon Conetta BS, Exercise Physiologist    Virtual Visit No    Medication changes reported     No    Fall or balance concerns reported    No    Warm-up and Cool-down Performed on first and last piece of equipment    Resistance Training Performed Yes    VAD Patient? No    PAD/SET Patient? No      Pain Assessment   Currently in Pain? No/denies             Social History   Tobacco Use  Smoking Status Former   Current packs/day: 0.00   Average packs/day: 0.5 packs/day for 5.0 years (2.5 ttl pk-yrs)   Types: Cigarettes   Start date: 2008   Quit date: 2013   Years since quitting: 12.6  Smokeless Tobacco Never  Tobacco Comments   Quit mid October 2014    Goals Met:  Independence with exercise equipment Exercise tolerated well No report of concerns or symptoms today Strength training completed today  Goals Unmet:  Not Applicable  Comments: Pt able to follow exercise prescription today without complaint.  Will continue to monitor for progression.    Dr. Oneil Pinal is Medical Director for [facility] Cardiac Rehabilitation.  Dr. Fuad Aleskerov is Medical Director for [facility] Pulmonary Rehabilitation.

## 2024-05-07 ENCOUNTER — Encounter

## 2024-05-07 DIAGNOSIS — I214 Non-ST elevation (NSTEMI) myocardial infarction: Secondary | ICD-10-CM

## 2024-05-07 DIAGNOSIS — Z955 Presence of coronary angioplasty implant and graft: Secondary | ICD-10-CM

## 2024-05-07 NOTE — Progress Notes (Signed)
 Daily Session Note  Patient Details  Name: Heather [name] MRN: [id number] Date of Birth: 04-04-1976 Referring Provider:   Flowsheet Row Cardiac Rehab from 04/03/2024 in [facility] Cardiac and Pulmonary Rehab  Referring Provider Florencio Kava, MD    Encounter Date: 05/07/2024  Check In:  Session Check In - 05/07/24 1533       Check-In   Supervising physician immediately available to respond to emergencies See telemetry face sheet for immediately available ER MD    Location [facility]-Cardiac & Pulmonary Rehab    Staff Present Burnard Davenport RN,BSN,MPA;Maxon Conetta BS, Exercise Physiologist;Joseph Rolinda RCP,RRT,BSRT;Meredith Tressa RN,BSN;Jason Elnor RDN,LDN;Melvina Pangelinan Dyane BS, ACSM CEP, Exercise Physiologist    Virtual Visit No    Medication changes reported     No    Fall or balance concerns reported    No    Warm-up and Cool-down Performed on first and last piece of equipment    Resistance Training Performed Yes    VAD Patient? No    PAD/SET Patient? No      Pain Assessment   Currently in Pain? No/denies             Social History   Tobacco Use  Smoking Status Former   Current packs/day: 0.00   Average packs/day: 0.5 packs/day for 5.0 years (2.5 ttl pk-yrs)   Types: Cigarettes   Start date: 2008   Quit date: 2013   Years since quitting: 12.6  Smokeless Tobacco Never  Tobacco Comments   Quit mid October 2014    Goals Met:  Independence with exercise equipment Exercise tolerated well No report of concerns or symptoms today Strength training completed today  Goals Unmet:  Not Applicable  Comments: Pt able to follow exercise prescription today without complaint.  Will continue to monitor for progression.    Dr. Oneil Pinal is Medical Director for [facility] Cardiac Rehabilitation.  Dr. Fuad Aleskerov is Medical Director for [facility] Pulmonary Rehabilitation.

## 2024-05-12 ENCOUNTER — Encounter

## 2024-05-14 ENCOUNTER — Encounter

## 2024-05-14 DIAGNOSIS — Z955 Presence of coronary angioplasty implant and graft: Secondary | ICD-10-CM

## 2024-05-14 DIAGNOSIS — I214 Non-ST elevation (NSTEMI) myocardial infarction: Secondary | ICD-10-CM

## 2024-05-14 NOTE — Progress Notes (Signed)
 Daily Session Note  Patient Details  Name: Heather [name] MRN: [id number] Date of Birth: 10-28-1975 Referring Provider:   Flowsheet Row Cardiac Rehab from 04/03/2024 in [facility] Cardiac and Pulmonary Rehab  Referring Provider Florencio Kava, MD    Encounter Date: 05/14/2024  Check In:  Session Check In - 05/14/24 1544       Check-In   Supervising physician immediately available to respond to emergencies See telemetry face sheet for immediately available ER MD    Location [facility]-Cardiac & Pulmonary Rehab    Staff Present Burnard Davenport RN,BSN,MPA;Joseph Rolinda RCP,RRT,BSRT;Laura Cates RN,BSN;Shaughn Thomley Dyane BS, ACSM CEP, Exercise Physiologist    Virtual Visit No    Medication changes reported     No    Fall or balance concerns reported    No    Warm-up and Cool-down Performed on first and last piece of equipment    Resistance Training Performed Yes    VAD Patient? No    PAD/SET Patient? No      Pain Assessment   Currently in Pain? No/denies             Social History   Tobacco Use  Smoking Status Former   Current packs/day: 0.00   Average packs/day: 0.5 packs/day for 5.0 years (2.5 ttl pk-yrs)   Types: Cigarettes   Start date: 2008   Quit date: 2013   Years since quitting: 12.6  Smokeless Tobacco Never  Tobacco Comments   Quit mid October 2014    Goals Met:  Independence with exercise equipment Exercise tolerated well No report of concerns or symptoms today Strength training completed today  Goals Unmet:  Not Applicable  Comments: Pt able to follow exercise prescription today without complaint.  Will continue to monitor for progression.    Dr. Oneil Pinal is Medical Director for [facility] Cardiac Rehabilitation.  Dr. Fuad Aleskerov is Medical Director for [facility] Pulmonary Rehabilitation.

## 2024-05-16 ENCOUNTER — Ambulatory Visit

## 2024-05-16 DIAGNOSIS — E538 Deficiency of other specified B group vitamins: Secondary | ICD-10-CM | POA: Diagnosis not present

## 2024-05-16 MED ORDER — CYANOCOBALAMIN 1000 MCG/ML IJ SOLN
1000.0000 ug | Freq: Once | INTRAMUSCULAR | Status: AC
Start: 2024-05-16 — End: 2024-05-16
  Administered 2024-05-16: 1000 ug via INTRAMUSCULAR

## 2024-05-16 NOTE — Progress Notes (Signed)
 After obtaining consent, and per orders of Dr.Scott, MD injection of B-12 injection given IM in L Deltoid by Levora Ly, CMA. Patient tolerated injection well.

## 2024-05-18 ENCOUNTER — Other Ambulatory Visit: Payer: Self-pay | Admitting: Internal Medicine

## 2024-05-18 DIAGNOSIS — K2210 Ulcer of esophagus without bleeding: Secondary | ICD-10-CM

## 2024-05-19 ENCOUNTER — Encounter: Admitting: Emergency Medicine

## 2024-05-19 DIAGNOSIS — I214 Non-ST elevation (NSTEMI) myocardial infarction: Secondary | ICD-10-CM | POA: Diagnosis not present

## 2024-05-19 DIAGNOSIS — Z955 Presence of coronary angioplasty implant and graft: Secondary | ICD-10-CM

## 2024-05-19 NOTE — Progress Notes (Signed)
 Daily Session Note  Patient Details  Name: Heather [name] MRN: [id number] Date of Birth: 05-11-1976 Referring Provider:   Flowsheet Row Cardiac Rehab from 04/03/2024 in [facility] Cardiac and Pulmonary Rehab  Referring Provider Florencio Kava, MD    Encounter Date: 05/19/2024  Check In:  Session Check In - 05/19/24 1600       Check-In   Supervising physician immediately available to respond to emergencies See telemetry face sheet for immediately available ER MD    Location [facility]-Cardiac & Pulmonary Rehab    Staff Present Rollene Paterson, MS, Exercise Physiologist;Maxon Conetta BS, Exercise Physiologist;Joseph Rolinda RCP,RRT,BSRT;Cristina Mattern RN,BSN    Virtual Visit No    Medication changes reported     Yes    Comments started topomax and ubrelvy (PRN) for migraine management    Fall or balance concerns reported    Yes    Comments no reported injury    Warm-up and Cool-down Performed on first and last piece of equipment    Resistance Training Performed Yes    VAD Patient? No    PAD/SET Patient? No      Pain Assessment   Currently in Pain? No/denies             Social History   Tobacco Use  Smoking Status Former   Current packs/day: 0.00   Average packs/day: 0.5 packs/day for 5.0 years (2.5 ttl pk-yrs)   Types: Cigarettes   Start date: 2008   Quit date: 2013   Years since quitting: 12.6  Smokeless Tobacco Never  Tobacco Comments   Quit mid October 2014    Goals Met:  Independence with exercise equipment Exercise tolerated well No report of concerns or symptoms today Strength training completed today  Goals Unmet:  Not Applicable  Comments: Pt able to follow exercise prescription today without complaint.  Will continue to monitor for progression.    Dr. Oneil Pinal is Medical Director for [facility] Cardiac Rehabilitation.  Dr. Fuad Aleskerov is Medical Director for [facility] Pulmonary Rehabilitation.

## 2024-05-21 ENCOUNTER — Telehealth: Payer: Self-pay | Admitting: *Deleted

## 2024-05-21 ENCOUNTER — Encounter: Payer: Self-pay | Admitting: *Deleted

## 2024-05-21 ENCOUNTER — Encounter

## 2024-05-21 DIAGNOSIS — I214 Non-ST elevation (NSTEMI) myocardial infarction: Secondary | ICD-10-CM

## 2024-05-21 DIAGNOSIS — Z955 Presence of coronary angioplasty implant and graft: Secondary | ICD-10-CM

## 2024-05-21 NOTE — Progress Notes (Signed)
 Heather [name] called to state that she would not be coming to class today. She was feeling tired and stated she was having chest discomfort. She was advised to take her nitroglycerin  to see if it relieved her symptoms, and to call her doctor. She was also advised that if her symptoms were not relieved with nitroglycerin  to call 911.

## 2024-05-21 NOTE — Telephone Encounter (Signed)
 Heather [name] called to state that she would not be coming to class today. She was feeling tired and stated she was having chest discomfort. She was advised to take her nitroglycerin  to see if it relieved her symptoms, and to call her doctor. She was also advised that if her symptoms were not relieved with nitroglycerin  to call 911.

## 2024-05-21 NOTE — Progress Notes (Signed)
 Cardiac Individual Treatment Plan  Patient Details  Name: Heather [name] MRN: [id number] Date of Birth: 1976/03/03 Referring Provider:   Flowsheet Row Cardiac Rehab from 04/03/2024 in [facility] Cardiac and Pulmonary Rehab  Referring Provider Florencio Kava, MD    Initial Encounter Date:  Flowsheet Row Cardiac Rehab from 04/03/2024 in [facility] Cardiac and Pulmonary Rehab  Date 04/03/24    Visit Diagnosis: Status post coronary artery stent placement  NSTEMI (non-ST elevated myocardial infarction) [facility])  Patient's Home Medications on Admission:  Current Outpatient Medications:    AJOVY 225 MG/1.5ML SOAJ, Inject 225 mg into the skin every 30 (thirty) days. (Patient not taking: Reported on 04/03/2024), Disp: , Rfl:    albuterol  (VENTOLIN  HFA) 108 (90 Base) MCG/ACT inhaler, INHALE 1 TO 2 PUFFS BY MOUTH EVERY 4 HOURS AS NEEDED FOR WHEEZING FOR SHORTNESS OF BREATH, Disp: 18 g, Rfl: 1   Albuterol -Budesonide  (AIRSUPRA ) 90-80 MCG/ACT AERO, INHALE 2 PUFFS EVERY 6 HOURS, Disp: 11 g, Rfl: 6   [Paused] amphetamine -dextroamphetamine  (ADDERALL XR) 20 MG 24 hr capsule, Take 20 mg by mouth every morning. (Patient not taking: Reported on 04/03/2024), Disp: , Rfl:    ascorbic acid (VITAMIN C) 1000 MG tablet, Take 500 mg by mouth daily., Disp: , Rfl:    aspirin  EC 81 MG tablet, Take 1 tablet (81 mg total) by mouth daily. Swallow whole., Disp: , Rfl:    benzonatate  (TESSALON ) 100 MG capsule, Take 1 capsule (100 mg total) by mouth every 8 (eight) hours. (Patient taking differently: Take 100 mg by mouth every 8 (eight) hours as needed for cough.), Disp: 21 capsule, Rfl: 0   cetirizine  (ZYRTEC ) 10 MG tablet, Take 1 tablet by mouth once daily, Disp: 90 tablet, Rfl: 3   cyanocobalamin  (VITAMIN B12) 1000 MCG/ML injection, Inject 1,000 mcg into the muscle every 30 (thirty) days., Disp: , Rfl:    diazepam  (VALIUM ) 10 MG tablet, Take 1 tablet by mouth 4 (four) times daily as needed., Disp: , Rfl:    divalproex  (DEPAKOTE  ER) 500  MG 24 hr tablet, Take 1,500 mg by mouth at bedtime., Disp: , Rfl:    Dupilumab  (DUPIXENT ) 300 MG/2ML SOAJ, Inject 300 mg into the skin every 14 (fourteen) days. **loading dose completed in clinic on 01/04/2024**, Disp: 12 mL, Rfl: 1   ezetimibe (ZETIA) 10 MG tablet, Take 1 tablet by mouth daily., Disp: , Rfl:    fluticasone  (FLONASE ) 50 MCG/ACT nasal spray, Place 1 spray into both nostrils daily., Disp: 48 g, Rfl: 1   fluticasone -salmeterol (ADVAIR) 500-50 MCG/ACT AEPB, Inhale 1 puff into the lungs in the morning and at bedtime., Disp: 60 each, Rfl: 11   heparin  25000 UT/250ML infusion, Inject 1,350 Units/hr into the vein continuous., Disp: , Rfl:    ipratropium (ATROVENT ) 0.03 % nasal spray, Place 2 sprays into both nostrils every 12 (twelve) hours., Disp: 30 mL, Rfl: 12   ipratropium-albuterol  (DUONEB) 0.5-2.5 (3) MG/3ML SOLN, Inhale 3 mLs into the lungs every 4 (four) hours as needed., Disp: 360 mL, Rfl: 11   isosorbide mononitrate (IMDUR) 30 MG 24 hr tablet, Take 30 mg by mouth daily., Disp: , Rfl:    linaclotide  (LINZESS ) 290 MCG CAPS capsule, Take 1 capsule (290 mcg total) by mouth daily before breakfast. (Patient taking differently: Take 290 mcg by mouth daily as needed.), Disp: 30 capsule, Rfl: 1   losartan (COZAAR) 25 MG tablet, Take 25 mg by mouth daily., Disp: , Rfl:    metoprolol succinate (TOPROL-XL) 25 MG 24 hr tablet, Take  1 tablet by mouth daily., Disp: , Rfl:    montelukast  (SINGULAIR ) 10 MG tablet, Take 1 tablet (10 mg total) by mouth at bedtime., Disp: 30 tablet, Rfl: 11   nitroGLYCERIN  (NITROSTAT ) 0.4 MG SL tablet, Place 0.4 mg under the tongue every 5 (five) minutes as needed., Disp: , Rfl:    nitroGLYCERIN  0.2 mg/mL infusion, Inject 0-200 mcg/min into the vein continuous., Disp: , Rfl:    omeprazole  (PRILOSEC) 40 MG capsule, TAKE 1 CAPSULE BY MOUTH ONCE DAILY BEFORE BREAKFAST, Disp: 30 capsule, Rfl: 0   ondansetron  (ZOFRAN -ODT) 4 MG disintegrating tablet, DISSOLVE 1 TABLET IN  MOUTH TWICE DAILY AS NEEDED FOR NAUSEA AND  VOMITING, Disp: 20 tablet, Rfl: 0   prasugrel (EFFIENT) 10 MG TABS tablet, Take 1 tablet by mouth daily., Disp: , Rfl:    Rimegepant Sulfate  (NURTEC) 75 MG TBDP, Take 75 mg by mouth daily as needed. (Patient not taking: Reported on 04/03/2024), Disp: , Rfl:    rosuvastatin (CRESTOR) 40 MG tablet, Take 40 mg by mouth daily., Disp: , Rfl:    umeclidinium bromide  (INCRUSE ELLIPTA ) 62.5 MCG/ACT AEPB, Inhale 1 puff into the lungs daily., Disp: 3 each, Rfl: 3  Past Medical History: 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 hauler    Depression    bipolar   Dysrhythmia    Fainting spell    H/O   H/O cardiac arrhythmia    Migraines    Seizures (HCC)     Tobacco Use: Social History   Tobacco Use  Smoking Status Former   Current packs/day: 0.00   Average packs/day: 0.5 packs/day for 5.0 years (2.5 ttl pk-yrs)   Types: Cigarettes   Start date: 2008   Quit date: 2013   Years since quitting: 12.6  Smokeless Tobacco Never  Tobacco Comments   Quit mid October 2014    Labs: Review Flowsheet  More data exists      Latest Ref Rng & Units 05/18/2021 08/29/2021 07/10/2022 11/07/2023 02/21/2024  Labs for ITP Cardiac and Pulmonary Rehab  Cholestrol 0 - 200 mg/dL 829  793  792  800  812   LDL (calc) 0 - 99 mg/dL 889  865  - 898  893   Direct LDL mg/dL - - 863.9  - -  HDL-C >59 mg/dL 60.89  60.79  63.39  61.39  35   Trlycerides <150 mg/dL 894.9  835.9  649.9  705.9  230   Hemoglobin A1c 4.8 - 5.6 % - - - - 4.7      Exercise Target Goals: Exercise Program Goal: Individual exercise prescription set using results from initial 6 min walk test and THRR while considering  patient's activity barriers and safety.   Exercise Prescription Goal: Initial exercise prescription builds to 30-45 minutes a day of aerobic activity, 2-3 days per week.  Home exercise guidelines will be given to patient during program as part of exercise  prescription that the participant will acknowledge.   Education: Aerobic Exercise: - Group verbal and visual presentation on the components of exercise prescription. Introduces F.I.T.T principle from ACSM for exercise prescriptions.  Reviews F.I.T.T. principles of aerobic exercise including progression. Written material provided at class time.   Education: Resistance Exercise: - Group verbal and visual presentation on the components of exercise prescription. Introduces F.I.T.T principle from ACSM for exercise prescriptions  Reviews F.I.T.T. principles of resistance exercise including progression. Written material provided at class time. Flowsheet Row Cardiac Rehab from 04/23/2024 in  [facility] Cardiac and Pulmonary Rehab  Date 04/23/24  Educator [facility]  Instruction Review Code 1- [employer] Understanding     Education: Exercise & Equipment Safety: - Individual verbal instruction and demonstration of equipment use and safety with use of the equipment. Flowsheet Row Cardiac Rehab from 04/23/2024 in [facility] Cardiac and Pulmonary Rehab  Date 04/03/24  Educator MB  Instruction Review Code 1- Verbalizes Understanding    Education: Exercise Physiology & General Exercise Guidelines: - Group verbal and written instruction with models to review the exercise physiology of the cardiovascular system and associated critical values. Provides general exercise guidelines with specific guidelines to those with heart or lung disease. Written material provided at class time. Flowsheet Row Cardiac Rehab from 04/23/2024 in [facility] Cardiac and Pulmonary Rehab  Date 04/16/24  Educator [facility]  Instruction Review Code 1- [employer] Understanding    Education: Flexibility, Balance, Mind/Body Relaxation: - Group verbal and visual presentation with interactive activity on the components of exercise prescription. Introduces F.I.T.T principle from ACSM for exercise prescriptions. Reviews F.I.T.T. principles of flexibility and balance  exercise training including progression. Also discusses the mind body connection.  Reviews various relaxation techniques to help reduce and manage stress (i.e. Deep breathing, progressive muscle relaxation, and visualization). Balance handout provided to take home. Written material provided at class time. Flowsheet Row Cardiac Rehab from 04/23/2024 in [facility] Cardiac and Pulmonary Rehab  Date 04/23/24  Educator [facility]  Instruction Review Code 1- Verbalizes Understanding    Activity Barriers & Risk Stratification:  Activity Barriers & Cardiac Risk Stratification - 04/03/24 1045       Activity Barriers & Cardiac Risk Stratification   Activity Barriers Joint Problems;Back Problems;Other (comment);Balance Concerns    Comments Long Covid- lower extremity issues (aches/hurts/impacts balance sometimes)    Cardiac Risk Stratification Moderate          6 Minute Walk:  6 Minute Walk     Row Name 04/03/24 1043         6 Minute Walk   Phase Initial     Distance 965 feet     Walk Time 6 minutes     # of Rest Breaks 0     MPH 1.83     METS 3.42     RPE 11     Perceived Dyspnea  1     VO2 Peak 11.98     Symptoms Yes (comment)     Comments aching legs and fatigue (used railing some)     Resting HR 66 bpm     Resting BP 144/90     Resting Oxygen Saturation  97 %     Exercise Oxygen Saturation  during 6 min walk 97 %     Max Ex. HR 90 bpm     Max Ex. BP 150/88     2 Minute Post BP 130/88        Oxygen Initial Assessment:   Oxygen Re-Evaluation:   Oxygen Discharge (Final Oxygen Re-Evaluation):   Initial Exercise Prescription:  Initial Exercise Prescription - 04/03/24 1000       Date of Initial Exercise RX and Referring Provider   Date 04/03/24    Referring Provider Florencio Kava, MD      Oxygen   Maintain Oxygen Saturation 88% or higher      Treadmill   MPH 1.8   Try   Grade 0    Minutes 15    METs 2.38      Recumbant Bike   Level 3  RPM 50    Watts 25     Minutes 15    METs 3.42      NuStep   Level 3    SPM 80    Minutes 15    METs 3.42      T5 Nustep   Level 3    SPM 80    Minutes 15    METs 3.42      Track   Laps 23    Minutes 15    METs 2.25      Prescription Details   Frequency (times per week) 3    Duration Progress to 30 minutes of continuous aerobic without signs/symptoms of physical distress      Intensity   THRR 40-80% of Max Heartrate 108-150    Ratings of Perceived Exertion 11-13    Perceived Dyspnea 0-4      Progression   Progression Continue to progress workloads to maintain intensity without signs/symptoms of physical distress.      Resistance Training   Training Prescription Yes    Weight 5 lb    Reps 10-15          Perform Capillary Blood Glucose checks as needed.  Exercise Prescription Changes:   Exercise Prescription Changes     Row Name 04/03/24 1000 04/15/24 1400 04/29/24 1300 05/14/24 0900       Response to Exercise   Blood Pressure (Admit) 144/90 122/80 128/78 130/82    Blood Pressure (Exercise) 150/88 142/76 150/80 148/76    Blood Pressure (Exit) 130/88 110/80 120/70 126/70    Heart Rate (Admit) 66 bpm 81 bpm 99 bpm 96 bpm    Heart Rate (Exercise) 90 bpm 122 bpm 134 bpm 119 bpm    Heart Rate (Exit) 72 bpm 60 bpm 102 bpm 87 bpm    Oxygen Saturation (Admit) 97 % -- -- --    Oxygen Saturation (Exercise) 97 % -- -- --    Oxygen Saturation (Exit) 96 % -- -- --    Rating of Perceived Exertion (Exercise) 11 15 15 15     Perceived Dyspnea (Exercise) 1 -- -- --    Symptoms aching legs and fatigue none none none    Comments results 1st day of exercise -- --    Duration -- Progress to 30 minutes of  aerobic without signs/symptoms of physical distress Continue with 30 min of aerobic exercise without signs/symptoms of physical distress. Continue with 30 min of aerobic exercise without signs/symptoms of physical distress.    Intensity THRR New THRR unchanged THRR unchanged THRR unchanged       Progression   Progression -- Continue to progress workloads to maintain intensity without signs/symptoms of physical distress. Continue to progress workloads to maintain intensity without signs/symptoms of physical distress. Continue to progress workloads to maintain intensity without signs/symptoms of physical distress.    Average METs 3.42 3.17 3.31 2.8      Resistance Training   Training Prescription -- Yes Yes Yes    Weight -- 5 lb 5 lb 5 lb    Reps -- 10-15 10-15 10-15      Interval Training   Interval Training -- No No No      Treadmill   MPH -- 3.3 3 --    Grade -- 0 6 --    Minutes -- 15 15 --    METs -- 3.53 5.78 --      Recumbant Bike   Level -- -- 2 5  Watts -- -- 25 33    Minutes -- -- 15 15    METs -- -- 2.91 3.24      NuStep   Level -- 3 3 4     Minutes -- 15 115 30    METs -- 2.8 3.3 2.9      T5 Nustep   Level -- -- 4 --    Minutes -- -- 15 --    METs -- -- 2 --      Track   Laps -- -- -- 23    Minutes -- -- -- 15    METs -- -- -- 2.25      Oxygen   Maintain Oxygen Saturation -- 88% or higher 88% or higher 88% or higher       Exercise Comments:   Exercise Comments     Row Name 04/09/24 1539           Exercise Comments First full day of exercise!  Patient was oriented to gym and equipment including functions, settings, policies, and procedures.  Patient's individual exercise prescription and treatment plan were reviewed.  All starting workloads were established based on the results of the 6 minute walk test done at initial orientation visit.  The plan for exercise progression was also introduced and progression will be customized based on patient's performance and goals.          Exercise Goals and Review:   Exercise Goals     Row Name 04/03/24 1048             Exercise Goals   Increase Physical Activity Yes       Intervention Provide advice, education, support and counseling about physical activity/exercise needs.;Develop an  individualized exercise prescription for aerobic and resistive training based on initial evaluation findings, risk stratification, comorbidities and participant's personal goals.       Expected Outcomes Short Term: Attend rehab on a regular basis to increase amount of physical activity.;Long Term: Add in home exercise to make exercise part of routine and to increase amount of physical activity.;Long Term: Exercising regularly at least 3-5 days a week.       Increase Strength and Stamina Yes       Intervention Provide advice, education, support and counseling about physical activity/exercise needs.;Develop an individualized exercise prescription for aerobic and resistive training based on initial evaluation findings, risk stratification, comorbidities and participant's personal goals.       Expected Outcomes Short Term: Increase workloads from initial exercise prescription for resistance, speed, and METs.;Short Term: Perform resistance training exercises routinely during rehab and add in resistance training at home;Long Term: Improve cardiorespiratory fitness, muscular endurance and strength as measured by increased METs and functional capacity ( )       Able to understand and use rate of perceived exertion (RPE) scale Yes       Intervention Provide education and explanation on how to use RPE scale       Expected Outcomes Short Term: Able to use RPE daily in rehab to express subjective intensity level;Long Term:  Able to use RPE to guide intensity level when exercising independently       Able to understand and use Dyspnea scale Yes       Intervention Provide education and explanation on how to use Dyspnea scale       Expected Outcomes Short Term: Able to use Dyspnea scale daily in rehab to express subjective sense of shortness of breath during exertion;Long Term: Able to use  Dyspnea scale to guide intensity level when exercising independently       Knowledge and understanding of Target Heart Rate Range  (THRR) Yes       Intervention Provide education and explanation of THRR including how the numbers were predicted and where they are located for reference       Expected Outcomes Short Term: Able to state/look up THRR;Short Term: Able to use daily as guideline for intensity in rehab;Long Term: Able to use THRR to govern intensity when exercising independently       Able to check pulse independently Yes       Intervention Provide education and demonstration on how to check pulse in carotid and radial arteries.;Review the importance of being able to check your own pulse for safety during independent exercise       Expected Outcomes Short Term: Able to explain why pulse checking is important during independent exercise;Long Term: Able to check pulse independently and accurately       Understanding of Exercise Prescription Yes       Intervention Provide education, explanation, and written materials on patient's individual exercise prescription       Expected Outcomes Short Term: Able to explain program exercise prescription;Long Term: Able to explain home exercise prescription to exercise independently          Exercise Goals Re-Evaluation :  Exercise Goals Re-Evaluation     Row Name 04/09/24 1539 04/15/24 1448 04/29/24 1400 05/14/24 0952 05/19/24 1645     Exercise Goal Re-Evaluation   Exercise Goals Review Increase Physical Activity;Able to understand and use rate of perceived exertion (RPE) scale;Knowledge and understanding of Target Heart Rate Range (THRR);Understanding of Exercise Prescription;Increase Strength and Stamina;Able to understand and use Dyspnea scale;Able to check pulse independently Increase Physical Activity;Understanding of Exercise Prescription;Increase Strength and Stamina Increase Physical Activity;Understanding of Exercise Prescription;Increase Strength and Stamina Increase Physical Activity;Understanding of Exercise Prescription;Increase Strength and Stamina Increase Physical  Activity;Able to understand and use rate of perceived exertion (RPE) scale;Knowledge and understanding of Target Heart Rate Range (THRR);Understanding of Exercise Prescription;Increase Strength and Stamina;Able to understand and use Dyspnea scale;Able to check pulse independently   Comments Reviewed RPE and dyspnea scale, THR and program prescription with pt today.  Pt voiced understanding and was given a copy of goals to take home. Pearla is off to a good start in the program and completed her first day in this review. She worked at level 3 on the T4 nustep. She was able to do the treadmill and worked at a speed of 3.3 mph with no incline. We will continue to monitor her progress in the program. Jhane is doing well in the program. She recently increased her treadmill workload to a speed of 3 mph with a 6% incline. She also improved to level 4 on the T5 nustep. We will continue to monitor her progress in the program. Mekiah has only attended rehab twice since the last review. She was able to improve to level 5 on the recumbent bike and level 4 on the T4 nustep. She also did well walking 23 laps on the track. We will continue to monitor her progress in the program Reviewed home exercise with pt today.  Pt plans to do dumbbells for resistance, walking loop inside, might get a walking pad with a handle. and balance exercises for exercise. She plans to add 1 additional day of exercise at home.  Reviewed THR, pulse, RPE, sign and symptoms, pulse oximetery and when to call 911  or MD.  Also discussed weather considerations and indoor options.  Pt voiced understanding.   Expected Outcomes Short: Use RPE daily to regulate intensity. Long: Follow program prescription in THR. Short: Continue to follow current exercise prescription. Long: Continue exercise to improve strength and stamina. Short: Continue to progressively increase workloads. Long: Continue exercise to improve strength and stamina. Short: Continue to  progressively increase workloads. Long: Continue exercise to improve strength and stamina. Short: Add 1 additional day of exercise at home. Long: Continue to exercise at home independently.      Discharge Exercise Prescription (Final Exercise Prescription Changes):  Exercise Prescription Changes - 05/14/24 0900       Response to Exercise   Blood Pressure (Admit) 130/82    Blood Pressure (Exercise) 148/76    Blood Pressure (Exit) 126/70    Heart Rate (Admit) 96 bpm    Heart Rate (Exercise) 119 bpm    Heart Rate (Exit) 87 bpm    Rating of Perceived Exertion (Exercise) 15    Symptoms none    Duration Continue with 30 min of aerobic exercise without signs/symptoms of physical distress.    Intensity THRR unchanged      Progression   Progression Continue to progress workloads to maintain intensity without signs/symptoms of physical distress.    Average METs 2.8      Resistance Training   Training Prescription Yes    Weight 5 lb    Reps 10-15      Interval Training   Interval Training No      Recumbant Bike   Level 5    Watts 33    Minutes 15    METs 3.24      NuStep   Level 4    Minutes 30    METs 2.9      Track   Laps 23    Minutes 15    METs 2.25      Oxygen   Maintain Oxygen Saturation 88% or higher          Nutrition:  Target Goals: Understanding of nutrition guidelines, daily intake of sodium 1500mg , cholesterol 200mg , calories 30% from fat and 7% or less from saturated fats, daily to have 5 or more servings of fruits and vegetables.  Education: Nutrition 1 -Group instruction provided by verbal, written material, interactive activities, discussions, models, and posters to present general guidelines for heart healthy nutrition including macronutrients, label reading, and promoting whole foods over processed counterparts. Education serves as Pensions consultant of discussion of heart healthy eating for all. Written material provided at class time.    Education:  Nutrition 2 -Group instruction provided by verbal, written material, interactive activities, discussions, models, and posters to present general guidelines for heart healthy nutrition including sodium, cholesterol, and saturated fat. Providing guidance of habit forming to improve blood pressure, cholesterol, and body weight. Written material provided at class time.     Biometrics:  Pre Biometrics - 04/03/24 1048       Pre Biometrics   Height 5' 7.4 (1.712 m)    Weight 191 lb 14.4 oz (87 kg)    Waist Circumference 41.9 inches    Hip Circumference 47 inches    Waist to Hip Ratio 0.89 %    BMI (Calculated) 29.7    Single Leg Stand 23 seconds           Nutrition Therapy Plan and Nutrition Goals:  Nutrition Therapy & Goals - 04/03/24 1049       Nutrition Therapy  RD appointment deferred Yes      Personal Nutrition Goals   Nutrition Goal RD appointment deferred at this time, will think about maybe scheduling in the future      Intervention Plan   Intervention Prescribe, educate and counsel regarding individualized specific dietary modifications aiming towards targeted core components such as weight, hypertension, lipid management, diabetes, heart failure and other comorbidities.    Expected Outcomes Short Term Goal: Understand basic principles of dietary content, such as calories, fat, sodium, cholesterol and nutrients.          Nutrition Assessments:  MEDIFICTS Score Key: >=70 Need to make dietary changes  40-70 Heart Healthy Diet <= 40 Therapeutic Level Cholesterol Diet  Flowsheet Row Cardiac Rehab from 04/02/2024 in [facility] Cardiac and Pulmonary Rehab  Picture Your Plate Total Score on Admission 59   Picture Your Plate Scores: <59 Unhealthy dietary pattern with much room for improvement. 41-50 Dietary pattern unlikely to meet recommendations for good health and room for improvement. 51-60 More healthful dietary pattern, with some room for improvement.  >60 Healthy  dietary pattern, although there may be some specific behaviors that could be improved.    Nutrition Goals Re-Evaluation:  Nutrition Goals Re-Evaluation     Row Name 05/19/24 1701             Goals   Nutrition Goal RD appointment deferred at this time          Nutrition Goals Discharge (Final Nutrition Goals Re-Evaluation):  Nutrition Goals Re-Evaluation - 05/19/24 1701       Goals   Nutrition Goal RD appointment deferred at this time          Psychosocial: Target Goals: Acknowledge presence or absence of significant depression and/or stress, maximize coping skills, provide positive support system. Participant is able to verbalize types and ability to use techniques and skills needed for reducing stress and depression.   Education: Stress, Anxiety, and Depression - Group verbal and visual presentation to define topics covered.  Reviews how body is impacted by stress, anxiety, and depression.  Also discusses healthy ways to reduce stress and to treat/manage anxiety and depression. Written material provided at class time. Flowsheet Row Cardiac Rehab from 04/23/2024 in [facility]  Date 04/09/24  Educator mc  Instruction Review Code 1- Verbalizes Understanding    Education: Sleep Hygiene -Provides group verbal and written instruction about how sleep can affect your health.  Define sleep hygiene, discuss sleep cycles and impact of sleep habits. Review good sleep hygiene tips.   Initial Review & Psychosocial Screening:  Initial Psych Review & Screening - 04/02/24 1117       Initial Review   Current issues with Current Stress Concerns;Current Psychotropic Meds    Source of Stress Concerns Chronic Illness;Unable to participate in former interests or hobbies;Unable to perform yard/household activities      Barriers   Psychosocial barriers to participate in program There are no identifiable barriers or psychosocial needs.      Screening Interventions    Interventions Encouraged to exercise;To provide support and resources with identified psychosocial needs;Provide feedback about the scores to participant    Expected Outcomes Short Term goal: Utilizing psychosocial counselor, staff and physician to assist with identification of specific Stressors or current issues interfering with healing process. Setting desired goal for each stressor or current issue identified.;Long Term Goal: Stressors or current issues are controlled or eliminated.;Short Term goal: Identification and review with participant of any Quality of Life or  Depression concerns found by scoring the questionnaire.;Long Term goal: The participant improves quality of Life and PHQ9 Scores as seen by post scores and/or verbalization of changes          Quality of Life Scores:   Scores of 19 and below usually indicate a poorer quality of life in these areas.  A difference of  2-3 points is a clinically meaningful difference.  A difference of 2-3 points in the total score of the Quality of Life Index has been associated with significant improvement in overall quality of life, self-image, physical symptoms, and general health in studies assessing change in quality of life.  PHQ-9: Review Flowsheet  More data exists      05/19/2024 04/03/2024 04/27/2023 10/26/2022 07/19/2022  Depression screen PHQ 2/9  Decreased Interest 3 3 1 3  0  Down, Depressed, Hopeless 1 1 1  0 0  PHQ - 2 Score 4 4 2 3  0  Altered sleeping 3 3 - 0 -  Tired, decreased energy 3 3 0 3 -  Change in appetite 0 0 0 0 -  Feeling bad or failure about yourself  1 1 1 1  -  Trouble concentrating 2 3 1 2  -  Moving slowly or fidgety/restless 2 1 0 0 -  Suicidal thoughts 0 0 0 0 -  PHQ-9 Score 15 15 - 9 -  Difficult doing work/chores Extremely dIfficult Extremely dIfficult Extremely dIfficult Extremely dIfficult -   Interpretation of Total Score  Total Score Depression Severity:  1-4 = Minimal depression, 5-9 = Mild depression,  10-14 = Moderate depression, 15-19 = Moderately severe depression, 20-27 = Severe depression   Psychosocial Evaluation and Intervention:  Psychosocial Evaluation - 04/02/24 1118       Psychosocial Evaluation & Interventions   Interventions Encouraged to exercise with the program and follow exercise prescription;Stress management education;Relaxation education    Comments Lewanna is coming to cardiac rehab 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 She is meeting with therapists to help with her long covid symptoms. She is on mood stabilizer medication as well to help with symptoms. She is wary of attending the program because she doesn't know if she will be able to finish it, but she wants to learn as much as possible. She is on long term disability from Covid because if she is very active, it takes her 2-3 days to recover. She is learning to take breaks and pace herself. She is being followed with the long covid clinic at [facility] which has been beneficial for her.    Expected Outcomes Short: attend cardiac rehab for education and exercise Long: develop and maintain positive self care habits    Continue Psychosocial Services  Follow up required by staff          Psychosocial Re-Evaluation:  Psychosocial Re-Evaluation     Row Name 05/19/24 1652             Psychosocial Re-Evaluation   Current issues with Current Stress Concerns       Comments Pallas has current stress regarding her chronic illness. She is dealing with chronic fatigue and gets wiped out for multiple days after coming to rehab. She is learning to listen to her body and determine what her limits are to reduce the fatigue. We discussed home exercise and intermittent exercise sessions to do at home during her good days with rest in between  or when she can not attend rehab, but wants to do some movement at home. She hopes this  will help her stamina levels. She has a strong support system, and her occupational therapist has been a huge help in this current stressor and she meets with her on Tuesdays. She followed up on her PHQ screening and it stayed the same at 15 today.       Expected Outcomes Short: Add in some exercise at home and ways to reduce her fatigue after rehab. Long: Attend Cardiac Rehab for exercise benefits and education on stress management       Interventions Encouraged to attend Cardiac Rehabilitation for the exercise          Psychosocial Discharge (Final Psychosocial Re-Evaluation):  Psychosocial Re-Evaluation - 05/19/24 1652       Psychosocial Re-Evaluation   Current issues with Current Stress Concerns    Comments Yanira has current stress regarding her chronic illness. She is dealing with chronic fatigue and gets wiped out for multiple days after coming to rehab. She is learning to listen to her body and determine what her limits are to reduce the fatigue. We discussed home exercise and intermittent exercise sessions to do at home during her good days with rest in between or when she can not attend rehab, but wants to do some movement at home. She hopes this will help her stamina levels. She has a strong support system, and her occupational therapist has been a huge help in this current stressor and she meets with her on Tuesdays. She followed up on her PHQ screening and it stayed the same at 15 today.    Expected Outcomes Short: Add in some exercise at home and ways to reduce her fatigue after rehab. Long: Attend Cardiac Rehab for exercise benefits and education on stress management    Interventions Encouraged to attend Cardiac Rehabilitation for the exercise          Vocational Rehabilitation: Provide vocational rehab assistance to qualifying candidates.   Vocational Rehab Evaluation & Intervention:  Vocational Rehab - 04/02/24 1117       Initial Vocational Rehab Evaluation & Intervention    Assessment shows need for Vocational Rehabilitation No          Education: Education Goals: Education classes will be provided on a variety of topics geared toward better understanding of heart health and risk factor modification. Participant will state understanding/return demonstration of topics presented as noted by education test scores.  Learning Barriers/Preferences:  Learning Barriers/Preferences - 04/02/24 1116       Learning Barriers/Preferences   Learning Barriers Exercise Concerns   Long Covid- brain fog   Learning Preferences Individual Instruction          General Cardiac Education Topics:  AED/CPR: - Group verbal and written instruction with the use of models to demonstrate the basic use of the AED with the basic ABC's of resuscitation.   Test and Procedures: - Group verbal and visual presentation and models provide information about basic cardiac anatomy and function. Reviews the testing methods done to diagnose heart disease and the outcomes of the test results. Describes the treatment choices: Medical Management, Angioplasty, or Coronary Bypass Surgery for treating various heart conditions including Myocardial Infarction, Angina, Valve Disease, and Cardiac Arrhythmias. Written material provided at class time.   Medication Safety: - Group verbal and visual instruction to review commonly prescribed medications for heart and lung disease. Reviews the medication, class of the drug, and side effects. Includes  the steps to properly store meds and maintain the prescription regimen. Written material provided at class time.   Intimacy: - Group verbal instruction through game format to discuss how heart and lung disease can affect sexual intimacy. Written material provided at class time.   Know Your Numbers and Heart Failure: - Group verbal and visual instruction to discuss disease risk factors for cardiac and pulmonary disease and treatment options.  Reviews  associated critical values for Overweight/Obesity, Hypertension, Cholesterol, and Diabetes.  Discusses basics of heart failure: signs/symptoms and treatments.  Introduces Heart Failure Zone chart for action plan for heart failure. Written material provided at class time.   Infection Prevention: - Provides verbal and written material to individual with discussion of infection control including proper hand washing and proper equipment cleaning during exercise session. Flowsheet Row Cardiac Rehab from 04/23/2024 in [facility] Cardiac and Pulmonary Rehab  Date 04/03/24  Educator MB  Instruction Review Code 1- Verbalizes Understanding    Falls Prevention: - Provides verbal and written material to individual with discussion of falls prevention and safety. Flowsheet Row Cardiac Rehab from 04/23/2024 in [facility] Cardiac and Pulmonary Rehab  Date 04/03/24  Educator MB  Instruction Review Code 1- Verbalizes Understanding    Other: -Provides group and verbal instruction on various topics (see comments)   Knowledge Questionnaire Score:   Core Components/Risk Factors/Patient Goals at Admission:  Personal Goals and Risk Factors at Admission - 04/03/24 1049       Core Components/Risk Factors/Patient Goals on Admission    Weight Management Yes;Weight Loss    Intervention Weight Management: Develop a combined nutrition and exercise program designed to reach desired caloric intake, while maintaining appropriate intake of nutrient and fiber, sodium and fats, and appropriate energy expenditure required for the weight goal.;Weight Management: Provide education and appropriate resources to help participant work on and attain dietary goals.;Weight Management/Obesity: Establish reasonable short term and long term weight goals.    Admit Weight 191 lb 14.4 oz (87 kg)    Goal Weight: Short Term 169 lb 8 oz (76.9 kg)    Goal Weight: Long Term 148 lb (67.1 kg)    Expected Outcomes Short Term: Continue to assess and  modify interventions until short term weight is achieved;Long Term: Adherence to nutrition and physical activity/exercise program aimed toward attainment of established weight goal;Weight Loss: Understanding of general recommendations for a balanced deficit meal plan, which promotes 1-2 lb weight loss per week and includes a negative energy balance of [phone] kcal/d;Understanding recommendations for meals to include 15-35% energy as protein, 25-35% energy from fat, 35-60% energy from carbohydrates, less than 200mg  of dietary cholesterol, 20-35 gm of total fiber daily;Understanding of distribution of calorie intake throughout the day with the consumption of 4-5 meals/snacks    Hypertension Yes    Intervention Provide education on lifestyle modifcations including regular physical activity/exercise, weight management, moderate sodium restriction and increased consumption of fresh fruit, vegetables, and low fat dairy, alcohol moderation, and smoking cessation.;Monitor prescription use compliance.    Expected Outcomes Short Term: Continued assessment and intervention until BP is < 140/3mm HG in hypertensive participants. < 130/74mm HG in hypertensive participants with diabetes, heart failure or chronic kidney disease.;Long Term: Maintenance of blood pressure at goal levels.    Lipids Yes    Intervention Provide education and support for participant on nutrition & aerobic/resistive exercise along with prescribed medications to achieve LDL 70mg , HDL >40mg .    Expected Outcomes Short Term: Participant states understanding of desired cholesterol values and is  compliant with medications prescribed. Participant is following exercise prescription and nutrition guidelines.;Long Term: Cholesterol controlled with medications as prescribed, with individualized exercise RX and with personalized nutrition plan. Value goals: LDL < 70mg , HDL > 40 mg.          Education:Diabetes - Individual verbal and written  instruction to review signs/symptoms of diabetes, desired ranges of glucose level fasting, after meals and with exercise. Acknowledge that pre and post exercise glucose checks will be done for 3 sessions at entry of program.   Core Components/Risk Factors/Patient Goals Review:   Goals and Risk Factor Review     Row Name 05/19/24 1701             Core Components/Risk Factors/Patient Goals Review   Personal Goals Review Weight Management/Obesity       Review Dazaria is doing very well with her weight loss goal. She weighed 180.5 lbs today and is currently down 11 lbs from her admission weight of 191lb. She states she is eating better and trying to be consistent with her exercise when she can.       Expected Outcomes Short: Continue logging weight. Long: Continue to attend rehab, do home exericse, and work on healthy eating habits.          Core Components/Risk Factors/Patient Goals at Discharge (Final Review):   Goals and Risk Factor Review - 05/19/24 1701       Core Components/Risk Factors/Patient Goals Review   Personal Goals Review Weight Management/Obesity    Review Jacqulin is doing very well with her weight loss goal. She weighed 180.5 lbs today and is currently down 11 lbs from her admission weight of 191lb. She states she is eating better and trying to be consistent with her exercise when she can.    Expected Outcomes Short: Continue logging weight. Long: Continue to attend rehab, do home exericse, and work on healthy eating habits.          ITP Comments:  ITP Comments     Row Name 04/02/24 1114 04/03/24 1043 04/09/24 1539 04/23/24 0943 05/21/24 0942   ITP Comments Initial phone call completed. Diagnosis can be found in [facility] 6/19. EP Orientation scheduled for Thursday 7/10 at 8:30. Completed and gym orientation for cardiac rehab. Initial ITP created and sent for review to Dr. Oneil Pinal, Medical Director. First full day of exercise!  Patient was oriented to gym and equipment  including functions, settings, policies, and procedures.  Patient's individual exercise prescription and treatment plan were reviewed.  All starting workloads were established based on the results of the 6 minute walk test done at initial orientation visit.  The plan for exercise progression was also introduced and progression will be customized based on patient's performance and goals. 30 Day review completed. Medical Director ITP review done, changes made as directed, and signed approval by Medical Director. New to program. 30 Day review completed. Medical Director ITP review done; changes made as directed and signed approval by Medical Director.      Comments: 30 day review

## 2024-05-28 ENCOUNTER — Encounter

## 2024-05-30 ENCOUNTER — Ambulatory Visit (INDEPENDENT_AMBULATORY_CARE_PROVIDER_SITE_OTHER): Admitting: Internal Medicine

## 2024-05-30 VITALS — BP 130/70 | HR 82 | Resp 16 | Ht 67.0 in | Wt 181.4 lb

## 2024-05-30 DIAGNOSIS — K589 Irritable bowel syndrome without diarrhea: Secondary | ICD-10-CM

## 2024-05-30 DIAGNOSIS — E538 Deficiency of other specified B group vitamins: Secondary | ICD-10-CM

## 2024-05-30 DIAGNOSIS — I25119 Atherosclerotic heart disease of native coronary artery with unspecified angina pectoris: Secondary | ICD-10-CM

## 2024-05-30 DIAGNOSIS — E559 Vitamin D deficiency, unspecified: Secondary | ICD-10-CM | POA: Diagnosis not present

## 2024-05-30 DIAGNOSIS — E7800 Pure hypercholesterolemia, unspecified: Secondary | ICD-10-CM | POA: Diagnosis not present

## 2024-05-30 DIAGNOSIS — D72829 Elevated white blood cell count, unspecified: Secondary | ICD-10-CM

## 2024-05-30 DIAGNOSIS — R0989 Other specified symptoms and signs involving the circulatory and respiratory systems: Secondary | ICD-10-CM

## 2024-05-30 DIAGNOSIS — J4550 Severe persistent asthma, uncomplicated: Secondary | ICD-10-CM

## 2024-05-30 DIAGNOSIS — Z0000 Encounter for general adult medical examination without abnormal findings: Secondary | ICD-10-CM

## 2024-05-30 DIAGNOSIS — G40909 Epilepsy, unspecified, not intractable, without status epilepticus: Secondary | ICD-10-CM

## 2024-05-30 DIAGNOSIS — K219 Gastro-esophageal reflux disease without esophagitis: Secondary | ICD-10-CM

## 2024-05-30 NOTE — Assessment & Plan Note (Signed)
 Physical today 05/30/24.  Colonoscopy 06/03/21 - recommended f/u in one year.  Mammogram - needs to be scheduled.

## 2024-05-30 NOTE — Progress Notes (Signed)
 Subjective:    Patient ID: Heather [name], female    DOB: [birth year], 48 y.o.   MRN: [id number]  Patient here for  Chief Complaint  Patient presents with   Annual Exam    HPI Here for a physical exam. Being followed for long covid - [facility]. Evaluated 05/15/24 - chronic migraines. Recommended ubrelvy. Also started on topamax. Recently admitted - non-STEMI - complex ostial circomflex disease s/p intervention (s/p drug eluting coronary stent placeemnt). Continue crestor, metoprolol and imdur as well. Continue cardiac rehab. Breathing overall appears to be doing better. Did notice - starting last week, some increased congestion and drainage. Some cough - worsened some over the last week, but overall improved. She is unable to use advair - feels the powder worsens her symptoms. Has started using her nebulizer over this past week. Has rescue inhaler. Felt symbicort  worked better for her. Plans to discuss with pulmonary Monday 06/02/24 - at f/u appt. Planning for PFTs. Discussed not being able to palpate pulse - right radail pulse. Hand intermittent - numb. No pain reported. Discussed f/u colonoscopy. Just hospitalized for NSTEMI. Will hold on colonoscopy right now.    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 hauler    Depression    bipolar   Dysrhythmia    Fainting spell    H/O   H/O cardiac arrhythmia    Migraines    Seizures (HCC)    Past Surgical History:  Procedure Laterality Date   COLONOSCOPY WITH PROPOFOL  N/A 06/03/2021   Procedure: COLONOSCOPY WITH PROPOFOL ;  Surgeon: Janalyn Keene NOVAK, MD;  Location: [facility] ENDOSCOPY;  Service: Endoscopy;  Laterality: N/A;   ESOPHAGOGASTRODUODENOSCOPY N/A 06/03/2021   Procedure: ESOPHAGOGASTRODUODENOSCOPY (EGD);  Surgeon: Janalyn Keene NOVAK, MD;  Location: [facility] ENDOSCOPY;  Service: Endoscopy;  Laterality: N/A;   LEFT HEART CATH AND CORONARY ANGIOGRAPHY N/A 02/21/2024   Procedure: LEFT HEART CATH AND CORONARY ANGIOGRAPHY;   Surgeon: Darron Deatrice LABOR, MD;  Location: [facility] INVASIVE CV LAB;  Service: Cardiovascular;  Laterality: N/A;   TUBAL LIGATION Bilateral 09/25/2002   tubal reversal     Family History  Problem Relation Age of Onset   Hypertension Mother    Diabetes Mother    Arthritis Mother    Heart disease Mother    Breast cancer Neg Hx    Social History   Socioeconomic History   Marital status: Married    Spouse name: Not on file   Number of children: Not on file   Years of education: Not on file   Highest education [name]: Not on file  Occupational History   Not on file  Tobacco Use   Smoking status: Former    Current packs/day: 0.00    Average packs/day: 0.5 packs/day for 5.0 years (2.5 ttl pk-yrs)    Types: Cigarettes    Start date: 2008    Quit date: 2013    Years since quitting: 12.6   Smokeless tobacco: Never   Tobacco comments:    Quit mid October 2014  Vaping Use   Vaping status: Never Used  Substance and Sexual Activity   Alcohol use: No    Alcohol/week: 0.0 standard drinks of alcohol   Drug use: No   Sexual activity: Yes    Birth control/protection: None  Other Topics Concern   Not on file  Social History Narrative   Not on file   Social Drivers of Health   Financial Resource Strain: Medium Risk (02/23/2024)  Received from [facility]   Overall Financial Resource Strain (CARDIA)    Difficulty of Paying Living Expenses: Somewhat hard  Food Insecurity: Food Insecurity Present (02/23/2024)   Received from [facility]   Hunger Vital Sign    Within the past 12 months, you worried that your food would run out before you got the money to buy more.: Sometimes true    Within the past 12 months, the food you bought just didn't last and you didn't have money to get more.: Sometimes true  Transportation Needs: No Transportation Needs (02/23/2024)   Received from [name] - [facility]    In the past 12  months, has lack of transportation kept you from medical appointments or from getting medications?: No    Lack of Transportation (Non-Medical): No  Physical Activity: Unknown (10/30/2023)   Exercise Vital Sign    Days of Exercise per Week: Patient declined    Minutes of Exercise per Session: Not on file  Stress: Stress Concern Present (10/30/2023)   [employer] of Occupational Health - Occupational Stress Questionnaire    Feeling of Stress : To some extent  Social Connections: Unknown (10/30/2023)   Social Connection and Isolation Panel    Frequency of Communication with Friends and Family: Patient declined    Frequency of Social Gatherings with Friends and Family: Never    Attends Religious Services: Never    Database administrator or Organizations: No    Attends Engineer, structural: Not on file    Marital Status: Patient declined     Review of Systems  Constitutional:  Negative for appetite change and unexpected weight change.  HENT:  Negative for congestion, sinus pressure and sore throat.   Eyes:  Negative for pain and visual disturbance.  Respiratory:  Negative for chest tightness.        Cough and congestion as outlined.   Cardiovascular:  Negative for chest pain, palpitations and leg swelling.  Gastrointestinal:  Negative for abdominal pain, diarrhea and vomiting.  Genitourinary:  Negative for difficulty urinating and dysuria.  Musculoskeletal:  Negative for joint swelling.  Skin:  Negative for color change and rash.  Neurological:  Negative for dizziness and headaches.  Hematological:  Negative for adenopathy. Does not bruise/bleed easily.  Psychiatric/Behavioral:  Negative for agitation and dysphoric mood.        Objective:     BP 130/70   Pulse 82   Resp 16   Ht 5' 7 (1.702 m)   Wt 181 lb 6.4 oz (82.3 kg)   SpO2 98%   BMI 28.41 kg/m  Wt Readings from Last 3 Encounters:  05/30/24 181 lb 6.4 oz (82.3 kg)  04/03/24 188 lb (85.3 kg)  04/03/24 191 lb  14.4 oz (87 kg)    Physical Exam Vitals reviewed.  Constitutional:      General: She is not in acute distress.    Appearance: Normal appearance.  HENT:     Head: Normocephalic and atraumatic.     Right Ear: External ear normal.     Left Ear: External ear normal.  Eyes:     General: No scleral icterus.       Right eye: No discharge.        Left eye: No discharge.     Conjunctiva/sclera: Conjunctivae normal.  Neck:     Thyroid : No thyromegaly.  Cardiovascular:     Rate and Rhythm: Normal rate and regular rhythm.  Comments: Unable to palpate right radial pulse.  Pulmonary:     Effort: No respiratory distress.     Breath sounds: Normal breath sounds. No wheezing.  Abdominal:     General: Bowel sounds are normal.     Palpations: Abdomen is soft.     Tenderness: There is no abdominal tenderness.  Musculoskeletal:        General: No swelling or tenderness.     Cervical back: Neck supple. No tenderness.  Lymphadenopathy:     Cervical: No cervical adenopathy.  Skin:    Findings: No erythema or rash.  Neurological:     Mental Status: [name].  Psychiatric:        Mood and Affect: Mood normal.        Behavior: Behavior normal.         Outpatient Encounter Medications as of 05/30/2024  Medication Sig   albuterol  (VENTOLIN  HFA) 108 (90 Base) MCG/ACT inhaler INHALE 1 TO 2 PUFFS BY MOUTH EVERY 4 HOURS AS NEEDED FOR WHEEZING FOR SHORTNESS OF BREATH   Albuterol -Budesonide  (AIRSUPRA ) 90-80 MCG/ACT AERO INHALE 2 PUFFS EVERY 6 HOURS   ascorbic acid (VITAMIN C) 1000 MG tablet Take 500 mg by mouth daily.   aspirin  EC 81 MG tablet Take 1 tablet (81 mg total) by mouth daily. Swallow whole.   benzonatate  (TESSALON ) 100 MG capsule Take 1 capsule (100 mg total) by mouth every 8 (eight) hours. (Patient taking differently: Take 100 mg by mouth every 8 (eight) hours as needed for cough.)   cetirizine  (ZYRTEC ) 10 MG tablet Take 1 tablet by mouth once daily   cyanocobalamin  (VITAMIN B12)  1000 MCG/ML injection Inject 1,000 mcg into the muscle every 30 (thirty) days.   diazepam  (VALIUM ) 10 MG tablet Take 1 tablet by mouth 4 (four) times daily as needed.   divalproex  (DEPAKOTE  ER) 500 MG 24 hr tablet Take 1,500 mg by mouth at bedtime.   Dupilumab  (DUPIXENT ) 300 MG/2ML SOAJ Inject 300 mg into the skin every 14 (fourteen) days. **loading dose completed in clinic on 01/04/2024**   ezetimibe (ZETIA) 10 MG tablet Take 1 tablet by mouth daily.   fluticasone  (FLONASE ) 50 MCG/ACT nasal spray Place 1 spray into both nostrils daily.   fluticasone -salmeterol (ADVAIR) 500-50 MCG/ACT AEPB Inhale 1 puff into the lungs in the morning and at bedtime.   ipratropium (ATROVENT ) 0.03 % nasal spray Place 2 sprays into both nostrils every 12 (twelve) hours.   ipratropium-albuterol  (DUONEB) 0.5-2.5 (3) MG/3ML SOLN Inhale 3 mLs into the lungs every 4 (four) hours as needed.   isosorbide mononitrate (IMDUR) 30 MG 24 hr tablet Take 30 mg by mouth daily.   linaclotide  (LINZESS ) 290 MCG CAPS capsule Take 1 capsule (290 mcg total) by mouth daily before breakfast. (Patient taking differently: Take 290 mcg by mouth daily as needed.)   losartan (COZAAR) 25 MG tablet Take 25 mg by mouth daily.   metoprolol succinate (TOPROL-XL) 25 MG 24 hr tablet Take 1 tablet by mouth daily.   montelukast  (SINGULAIR ) 10 MG tablet Take 1 tablet (10 mg total) by mouth at bedtime.   nitroGLYCERIN  (NITROSTAT ) 0.4 MG SL tablet Place 0.4 mg under the tongue every 5 (five) minutes as needed.   nitroGLYCERIN  0.2 mg/mL infusion Inject 0-200 mcg/min into the vein continuous.   omeprazole  (PRILOSEC) 40 MG capsule TAKE 1 CAPSULE BY MOUTH ONCE DAILY BEFORE BREAKFAST   ondansetron  (ZOFRAN -ODT) 4 MG disintegrating tablet DISSOLVE 1 TABLET IN MOUTH TWICE DAILY AS NEEDED FOR NAUSEA AND  VOMITING   prasugrel (EFFIENT) 10 MG TABS tablet Take 1 tablet by mouth daily.   rosuvastatin (CRESTOR) 40 MG tablet Take 40 mg by mouth daily.   [DISCONTINUED]  AJOVY 225 MG/1.5ML SOAJ Inject 225 mg into the skin every 30 (thirty) days. (Patient not taking: Reported on 04/03/2024)   [DISCONTINUED] amphetamine -dextroamphetamine  (ADDERALL XR) 20 MG 24 hr capsule Take 20 mg by mouth every morning. (Patient not taking: Reported on 04/03/2024)   [DISCONTINUED] heparin  25000 UT/250ML infusion Inject 1,350 Units/hr into the vein continuous.   [DISCONTINUED] Rimegepant Sulfate  (NURTEC) 75 MG TBDP Take 75 mg by mouth daily as needed. (Patient not taking: Reported on 04/03/2024)   [DISCONTINUED] umeclidinium bromide  (INCRUSE ELLIPTA ) 62.5 MCG/ACT AEPB Inhale 1 puff into the lungs daily.   No facility-administered encounter medications on file as of 05/30/2024.     Lab Results  Component Value Date   WBC 14.5 (H) 05/30/2024   HGB 13.6 05/30/2024   HCT 42.0 05/30/2024   PLT 277 05/30/2024   GLUCOSE 95 05/30/2024   CHOL 95 (L) 05/30/2024   TRIG 129 05/30/2024   HDL 41 05/30/2024   LDLDIRECT 136.0 07/10/2022   LDLCALC 31 05/30/2024   ALT 55 (H) 05/30/2024   AST 32 05/30/2024   NA 141 05/30/2024   K 3.4 (L) 05/30/2024   CL 104 05/30/2024   CREATININE 0.75 05/30/2024   BUN 11 05/30/2024   CO2 21 05/30/2024   TSH 1.640 05/30/2024   INR 1.0 02/20/2024   HGBA1C 4.7 (L) 02/21/2024    CARDIAC CATHETERIZATION Addendum Date: 02/22/2024   Dist LM to Ost LAD lesion is 30% stenosed.   Ost Cx to Prox Cx lesion is 90% stenosed.   Prox RCA lesion is 20% stenosed.   RPAV lesion is 20% stenosed.   LPAV lesion is 60% stenosed. 1.  Severe one-vessel coronary artery disease affecting the ostial left circumflex.  There is mild distal left main and ostial LAD stenosis. 2.  Left ventricular angiography was not performed.  EF was normal by echo. 3.  Moderately elevated left ventricular end-diastolic pressure at 22 mmHg. Recommendations: The ostial left circumflex stenosis is not optimal for PCI and likely requires bifurcation stenting in order to preserve the LAD.  Will discuss  tomorrow during the heart team meeting. Resume heparin  drip 2 hours after TR band removal.  Result Date: 02/22/2024   Dist LM to Ost LAD lesion is 30% stenosed.   Ost Cx to Prox Cx lesion is 90% stenosed.   Prox RCA lesion is 20% stenosed.   RPAV lesion is 20% stenosed. 1.  Severe one-vessel coronary artery disease affecting the ostial left circumflex.  There is mild distal left main and ostial LAD stenosis. 2.  Left ventricular angiography was not performed.  EF was normal by echo. 3.  Moderately elevated left ventricular end-diastolic pressure at 22 mmHg. Recommendations: The ostial left circumflex stenosis is not optimal for PCI and likely requires bifurcation stenting in order to preserve the LAD.  Will discuss tomorrow during the heart team meeting. Resume heparin  drip 2 hours after TR band removal.   ECHOCARDIOGRAM COMPLETE Result Date: 02/21/2024    ECHOCARDIOGRAM REPORT   Patient Name:   Heather [name], Heather [name] Date of Exam: 02/21/2024 Medical Rec #:  [id number]      Height:       66.0 in Accession #:    [id number]     Weight:       184.3 lb Date of Birth:  05/01/1976  BSA:          1.932 m Patient Age:    47 years       BP:           127/86 mmHg Patient Gender: F              HR:           75 bpm. Exam Location:  [facility] Procedure: 2D Echo, Cardiac Doppler, Color Doppler, Strain Analysis and 3D Echo            (Both Spectral and Color Flow Doppler were utilized during            procedure). Indications:     Chest pain R07.9  History:         Patient has no prior history of Echocardiogram examinations.                  History of cardiac arrhythmia and migraine.  Sonographer:     Christopher Furnace Referring Phys:  [id number] XILIN NIU Diagnosing Phys: Redell Cave MD  Sonographer Comments: Global longitudinal strain was attempted. IMPRESSIONS  1. Left ventricular ejection fraction, by estimation, is 55 to 60%. The left ventricle has normal function. The left ventricle has no regional wall motion abnormalities. Left  ventricular diastolic parameters were normal.  2. Right ventricular systolic function is normal. The right ventricular size is normal.  3. The mitral valve is normal in structure. Mild mitral valve regurgitation.  4. The aortic valve is tricuspid. Aortic valve regurgitation is not visualized. FINDINGS  Left Ventricle: Left ventricular ejection fraction, by estimation, is 55 to 60%. The left ventricle has normal function. The left ventricle has no regional wall motion abnormalities. Global longitudinal strain performed but not reported based on interpreter judgement due to suboptimal tracking. The left ventricular internal cavity size was normal in size. There is no left ventricular hypertrophy. Left ventricular diastolic parameters were normal. Right Ventricle: The right ventricular size is normal. No increase in right ventricular wall thickness. Right ventricular systolic function is normal. Left Atrium: Left atrial size was normal in size. Right Atrium: Right atrial size was normal in size. Pericardium: There is no evidence of pericardial effusion. Mitral Valve: The mitral valve is normal in structure. Mild mitral valve regurgitation. Tricuspid Valve: The tricuspid valve is normal in structure. Tricuspid valve regurgitation is not demonstrated. Aortic Valve: The aortic valve is tricuspid. Aortic valve regurgitation is not visualized. Aortic valve mean gradient measures 2.0 mmHg. Aortic valve peak gradient measures 3.2 mmHg. Aortic valve area, by VTI measures 2.79 cm. Pulmonic Valve: The pulmonic valve was not well visualized. Pulmonic valve regurgitation is not visualized. Aorta: The aortic root is normal in size and structure. Venous: The inferior vena cava was not well visualized. IAS/Shunts: No atrial [name] shunt detected by color flow Doppler.  LEFT VENTRICLE PLAX 2D LVIDd:         4.90 cm     Diastology LVIDs:         3.60 cm     LV e' medial:    8.81 cm/s LV PW:         1.00 cm     LV E/e' medial:  10.6 LV  IVS:        1.20 cm     LV e' lateral:   7.18 cm/s LVOT diam:     2.00 cm     LV E/e' lateral: 13.0 LV SV:  47 LV SV Index:   25 LVOT Area:     3.14 cm  LV Volumes (MOD) LV vol d, MOD A2C: 80.1 ml LV vol d, MOD A4C: 91.7 ml LV vol s, MOD A2C: 50.7 ml LV vol s, MOD A4C: 55.3 ml LV SV MOD A2C:     29.4 ml LV SV MOD A4C:     91.7 ml LV SV MOD BP:      32.7 ml RIGHT VENTRICLE RV Basal diam:  2.40 cm RV Mid diam:    2.00 cm RV S prime:     8.49 cm/s TAPSE (M-mode): 2.0 cm LEFT ATRIUM             Index        RIGHT ATRIUM          Index LA diam:        3.10 cm 1.60 cm/m   RA Area:     7.76 cm LA Vol (A2C):   32.3 ml 16.72 ml/m  RA Volume:   13.40 ml 6.94 ml/m LA Vol (A4C):   18.5 ml 9.58 ml/m LA Biplane Vol: 25.1 ml 12.99 ml/m  AORTIC VALVE AV Area (Vmax):    2.56 cm AV Area (Vmean):   2.53 cm AV Area (VTI):     2.79 cm AV Vmax:           89.90 cm/s AV Vmean:          61.300 cm/s AV VTI:            0.170 m AV Peak Grad:      3.2 mmHg AV Mean Grad:      2.0 mmHg LVOT Vmax:         73.40 cm/s LVOT Vmean:        49.400 cm/s LVOT VTI:          0.151 m LVOT/AV VTI ratio: 0.89  AORTA Ao Root diam: 2.60 cm MITRAL VALVE               TRICUSPID VALVE MV Area (PHT): 5.27 cm    TR Peak grad:   14.6 mmHg MV Decel Time: 144 msec    TR Vmax:        191.00 cm/s MV E velocity: 93.30 cm/s MV A velocity: 84.40 cm/s  SHUNTS MV E/A ratio:  1.11        Systemic VTI:  0.15 m                            Systemic Diam: 2.00 cm Redell Cave MD Electronically signed by Redell Cave MD Signature Date/Time: 02/21/2024/12:57:40 PM    Final        Assessment & Plan:  Routine general medical examination at a health care facility  Hypercholesterolemia Assessment & Plan: Low cholesterol diet and exercise. Follow lipid panel.   Orders: -     CBC with Differential/Platelet -     Basic metabolic panel with GFR -     Hepatic function panel -     Lipid panel -     TSH  Health care maintenance Assessment &  Plan: Physical today 05/30/24.  Colonoscopy 06/03/21 - recommended f/u in one year.  Mammogram - needs to be scheduled.     B12 deficiency -     Vitamin B12  Vitamin D  deficiency Assessment & Plan: Check vitamin D  [name].   Orders: -     VITAMIN D  25  Hydroxy (Vit-D Deficiency, Fractures)  Severe persistent asthma, unspecified whether complicated Assessment & Plan: Seeing pulmonary. Feels advair aggravates symptoms. Has started using nebulizer prn. Has rescue inhaler. Dupixent  - started 12/2023. Continue singulair . Symptoms are overall improved from previous. Has noticed, over the last week, some congestion and cough. Continue nebulizer treatments. Saline nasal spray and steroid nasal spray. Continue nebulizer treatment as needed. Has rescue inhaler prn. Plans to discuss advair intolerance with pulmonary Monday 06/02/24. Follow symptoms.    Seizure disorder [name]) Assessment & Plan: Being followed by neurology. No recent seizures.    Leukocytosis, unspecified type Assessment & Plan: Has been worked up by hematology. Felt to be reactive. Recheck cbc today.    Irritable bowel syndrome, unspecified type Assessment & Plan: Has seen GI - previous treatment - linzess .    Gastroesophageal reflux disease, unspecified whether esophagitis present Assessment & Plan: Continue omeprazole .    Coronary artery disease involving native coronary artery of native heart with angina pectoris [facility]) Assessment & Plan: Admitted 02/22/24 - 02/25/24 - admitted with NSTEMI. Initially presented 02/20/24 - chest pain and sob. CXR, CTPE (negative), TTE - EF 55-60%, no WMA, mild MR). Treated with aspirin  load, IV heparin , IV NTG, high intensity statin. LHC 5/29 - severe one vessel CAD affecting ostial left circumflex and ostial LAD stenosis. Transferred to [facility]. S/p PCE with DES - Lcx vessel. Recommended cardiac rehab. Continue lipitor  and zetia. Continues on imdur.  Continue risk factor modification. F/u with  cardiology.    Absent radial pulse Assessment & Plan: Unable to appreciate radial pulse in right arm. (Not acute change. Reports present post previous procedure). Some occasional numbness - right hand. Refer to vascular for further evaluation.   Orders: -     Ambulatory referral to Vascular Surgery     Allena Hamilton, MD

## 2024-05-31 ENCOUNTER — Telehealth: Payer: Self-pay | Admitting: Internal Medicine

## 2024-05-31 ENCOUNTER — Other Ambulatory Visit: Payer: Self-pay | Admitting: Internal Medicine

## 2024-05-31 ENCOUNTER — Ambulatory Visit: Payer: Self-pay | Admitting: Internal Medicine

## 2024-05-31 ENCOUNTER — Encounter: Payer: Self-pay | Admitting: Internal Medicine

## 2024-05-31 DIAGNOSIS — R7989 Other specified abnormal findings of blood chemistry: Secondary | ICD-10-CM

## 2024-05-31 DIAGNOSIS — Z01419 Encounter for gynecological examination (general) (routine) without abnormal findings: Secondary | ICD-10-CM

## 2024-05-31 DIAGNOSIS — R87619 Unspecified abnormal cytological findings in specimens from cervix uteri: Secondary | ICD-10-CM

## 2024-05-31 DIAGNOSIS — R0989 Other specified symptoms and signs involving the circulatory and respiratory systems: Secondary | ICD-10-CM | POA: Insufficient documentation

## 2024-05-31 DIAGNOSIS — D72829 Elevated white blood cell count, unspecified: Secondary | ICD-10-CM

## 2024-05-31 LAB — CBC WITH DIFFERENTIAL/PLATELET
Basophils Absolute: 0.2 x10E3/uL (ref 0.0–0.2)
Basos: 1 %
EOS (ABSOLUTE): 0.1 x10E3/uL (ref 0.0–0.4)
Eos: 1 %
Hematocrit: 42 % (ref 34.0–46.6)
Hemoglobin: 13.6 g/dL (ref 11.1–15.9)
Immature Grans (Abs): 0 x10E3/uL (ref 0.0–0.1)
Immature Granulocytes: 0 %
Lymphocytes Absolute: 4.5 x10E3/uL — ABNORMAL HIGH (ref 0.7–3.1)
Lymphs: 31 %
MCH: 27.6 pg (ref 26.6–33.0)
MCHC: 32.4 g/dL (ref 31.5–35.7)
MCV: 85 fL (ref 79–97)
Monocytes Absolute: 1 x10E3/uL — ABNORMAL HIGH (ref 0.1–0.9)
Monocytes: 7 %
Neutrophils Absolute: 8.7 x10E3/uL — ABNORMAL HIGH (ref 1.4–7.0)
Neutrophils: 60 %
Platelets: 277 x10E3/uL (ref 150–450)
RBC: 4.93 x10E6/uL (ref 3.77–5.28)
RDW: 13.8 % (ref 11.7–15.4)
WBC: 14.5 x10E3/uL — ABNORMAL HIGH (ref 3.4–10.8)

## 2024-05-31 LAB — TSH: TSH: 1.64 u[IU]/mL (ref 0.450–4.500)

## 2024-05-31 LAB — HEPATIC FUNCTION PANEL
ALT: 55 IU/L — ABNORMAL HIGH (ref 0–32)
AST: 32 IU/L (ref 0–40)
Albumin: 4.4 g/dL (ref 3.9–4.9)
Alkaline Phosphatase: 131 IU/L — ABNORMAL HIGH (ref 44–121)
Bilirubin Total: 0.3 mg/dL (ref 0.0–1.2)
Bilirubin, Direct: 0.11 mg/dL (ref 0.00–0.40)
Total Protein: 6.5 g/dL (ref 6.0–8.5)

## 2024-05-31 LAB — BASIC METABOLIC PANEL WITH GFR
BUN/Creatinine Ratio: 15 (ref 9–23)
BUN: 11 mg/dL (ref 6–24)
CO2: 21 mmol/L (ref 20–29)
Calcium: 9.1 mg/dL (ref 8.7–10.2)
Chloride: 104 mmol/L (ref 96–106)
Creatinine, Ser: 0.75 mg/dL (ref 0.57–1.00)
Glucose: 95 mg/dL (ref 70–99)
Potassium: 3.4 mmol/L — ABNORMAL LOW (ref 3.5–5.2)
Sodium: 141 mmol/L (ref 134–144)
eGFR: 98 mL/min/1.73 (ref >59)

## 2024-05-31 LAB — LIPID PANEL
Chol/HDL Ratio: 2.3 ratio (ref 0.0–4.4)
Cholesterol, Total: 95 mg/dL — ABNORMAL LOW (ref 100–199)
HDL: 41 mg/dL (ref >39)
LDL Chol Calc (NIH): 31 mg/dL (ref 0–99)
Triglycerides: 129 mg/dL (ref 0–149)
VLDL Cholesterol Cal: 23 mg/dL (ref 5–40)

## 2024-05-31 LAB — VITAMIN B12: Vitamin B-12: 765 pg/mL (ref 232–1245)

## 2024-05-31 LAB — VITAMIN D 25 HYDROXY (VIT D DEFICIENCY, FRACTURES): Vit D, 25-Hydroxy: 26.5 ng/mL — ABNORMAL LOW (ref 30.0–100.0)

## 2024-05-31 NOTE — Telephone Encounter (Signed)
 See me about this. She is overdue mammogram. Needs to schedule. Also, previously saw gyn for pap. Need to know if has f/u with gyn - for f/u pap.

## 2024-05-31 NOTE — Assessment & Plan Note (Signed)
 Has seen GI - previous treatment - linzess .

## 2024-05-31 NOTE — Progress Notes (Signed)
 Order placed for future labs

## 2024-05-31 NOTE — Assessment & Plan Note (Signed)
 Admitted 02/22/24 - 02/25/24 - admitted with NSTEMI. Initially presented 02/20/24 - chest pain and sob. CXR, CTPE (negative), TTE - EF 55-60%, no WMA, mild MR). Treated with aspirin  load, IV heparin , IV NTG, high intensity statin. LHC 5/29 - severe one vessel CAD affecting ostial left circumflex and ostial LAD stenosis. Transferred to [facility]. S/p PCE with DES - Lcx vessel. Recommended cardiac rehab. Continue lipitor  and zetia. Continues on imdur.  Continue risk factor modification. F/u with cardiology.

## 2024-05-31 NOTE — Assessment & Plan Note (Signed)
 Low cholesterol diet and exercise.  Follow lipid panel.

## 2024-05-31 NOTE — Assessment & Plan Note (Signed)
 Being followed by neurology.  No recent seizures.

## 2024-05-31 NOTE — Assessment & Plan Note (Signed)
 Check vitamin D  level

## 2024-05-31 NOTE — Assessment & Plan Note (Signed)
 Seeing pulmonary. Feels advair aggravates symptoms. Has started using nebulizer prn. Has rescue inhaler. Dupixent  - started 12/2023. Continue singulair . Symptoms are overall improved from previous. Has noticed, over the last week, some congestion and cough. Continue nebulizer treatments. Saline nasal spray and steroid nasal spray. Continue nebulizer treatment as needed. Has rescue inhaler prn. Plans to discuss advair intolerance with pulmonary Monday 06/02/24. Follow symptoms.

## 2024-05-31 NOTE — Assessment & Plan Note (Signed)
 Unable to appreciate radial pulse in right arm. (Not acute change. Reports present post previous procedure). Some occasional numbness - right hand. Refer to vascular for further evaluation.

## 2024-05-31 NOTE — Assessment & Plan Note (Signed)
 Has been worked up by hematology. Felt to be reactive. Recheck cbc today.

## 2024-05-31 NOTE — Assessment & Plan Note (Signed)
 Continue omeprazole

## 2024-06-02 ENCOUNTER — Encounter

## 2024-06-03 ENCOUNTER — Other Ambulatory Visit: Payer: Self-pay | Admitting: Internal Medicine

## 2024-06-03 MED ORDER — VITAMIN D (ERGOCALCIFEROL) 1.25 MG (50000 UNIT) PO CAPS: 50000.0000 [IU] | ORAL_CAPSULE | ORAL | 0 refills | Status: AC

## 2024-06-03 NOTE — Progress Notes (Signed)
 Rx sent in for ergocalciferol .

## 2024-06-04 ENCOUNTER — Encounter

## 2024-06-09 ENCOUNTER — Encounter: Attending: Internal Medicine | Admitting: Emergency Medicine

## 2024-06-09 DIAGNOSIS — Z955 Presence of coronary angioplasty implant and graft: Secondary | ICD-10-CM | POA: Diagnosis present

## 2024-06-09 DIAGNOSIS — I214 Non-ST elevation (NSTEMI) myocardial infarction: Secondary | ICD-10-CM | POA: Insufficient documentation

## 2024-06-09 NOTE — Progress Notes (Signed)
 Daily Session Note  Patient Details  Name: Heather [name] MRN: [id number] Date of Birth: Jun 17, 1976 Referring Provider:   Flowsheet Row Cardiac Rehab from 04/03/2024 in [facility] Cardiac [facility]  Referring Provider Florencio Kava, MD    Encounter Date: 06/09/2024  Check In:  Session Check In - 06/09/24 1547       Check-In   Supervising physician immediately available to respond to emergencies See telemetry face sheet for immediately available ER MD    Location [facility]-Cardiac & Pulmonary Rehab    Staff Present Rollene Paterson, MS, Exercise Physiologist;[place] Vita RN,BSN;Joseph Rolinda RCP,RRT,BSRT;Meredith Tressa RN,BSN    Virtual Visit No    Medication changes reported     No    Fall or balance concerns reported    No    Warm-up and Cool-down Performed on first and last piece of equipment    Resistance Training Performed Yes    VAD Patient? No    PAD/SET Patient? No      Pain Assessment   Currently in Pain? No/denies             Social History   Tobacco Use  Smoking Status Former   Current packs/day: 0.00   Average packs/day: 0.5 packs/day for 5.0 years (2.5 ttl pk-yrs)   Types: Cigarettes   Start date: 2008   Quit date: 2013   Years since quitting: 12.7  Smokeless Tobacco Never  Tobacco Comments   Quit mid October 2014    Goals Met:  Independence with exercise equipment Exercise tolerated well No report of concerns or symptoms today Strength training completed today  Goals Unmet:  Not Applicable  Comments: Pt able to follow exercise prescription today without complaint.  Will continue to monitor for progression.    Dr. Oneil Pinal is Medical Director for [facility] Cardiac Rehabilitation.  Dr. Fuad Aleskerov is Medical Director for [facility] Pulmonary Rehabilitation.

## 2024-06-10 ENCOUNTER — Telehealth: Payer: Self-pay

## 2024-06-10 NOTE — Telephone Encounter (Signed)
 Copied from CRM [id number]. Topic: Medical Record Request - Provider/Facility Request >> Jun 10, 2024  9:25 AM Pinkey ORN wrote: Reason for CRM: Medical Records >> Jun 10, 2024  9:29 AM Pinkey ORN wrote: Heather [name] [phone] is calling on behalf of patient, wanting an status update on the request for patient's medical records that was faxed over of Sept 8th.

## 2024-06-11 ENCOUNTER — Encounter

## 2024-06-13 NOTE — Addendum Note (Signed)
 Addended by: LEARTA PORTO D on: 06/13/2024 02:51 PM   Modules accepted: Orders

## 2024-06-13 NOTE — Telephone Encounter (Signed)
LM for patient and sent mychart message

## 2024-06-16 ENCOUNTER — Ambulatory Visit (INDEPENDENT_AMBULATORY_CARE_PROVIDER_SITE_OTHER)

## 2024-06-16 ENCOUNTER — Encounter

## 2024-06-16 DIAGNOSIS — D72829 Elevated white blood cell count, unspecified: Secondary | ICD-10-CM | POA: Diagnosis not present

## 2024-06-16 DIAGNOSIS — E538 Deficiency of other specified B group vitamins: Secondary | ICD-10-CM

## 2024-06-16 DIAGNOSIS — R7989 Other specified abnormal findings of blood chemistry: Secondary | ICD-10-CM

## 2024-06-16 MED ORDER — CYANOCOBALAMIN 1000 MCG/ML IJ SOLN
1000.0000 ug | Freq: Once | INTRAMUSCULAR | Status: AC
  Administered 2024-06-16: 1000 ug via INTRAMUSCULAR

## 2024-06-16 NOTE — Progress Notes (Signed)
Pt received B12 injection in right deltoid muscle. Pt tolerated it well with no complaints or concerns.  

## 2024-06-17 ENCOUNTER — Other Ambulatory Visit: Payer: Self-pay | Admitting: Internal Medicine

## 2024-06-17 DIAGNOSIS — K2210 Ulcer of esophagus without bleeding: Secondary | ICD-10-CM

## 2024-06-17 LAB — HEPATIC FUNCTION PANEL
ALT: 37 U/L — ABNORMAL HIGH (ref 0–35)
AST: 24 U/L (ref 0–37)
Albumin: 4.5 g/dL (ref 3.5–5.2)
Alkaline Phosphatase: 99 U/L (ref 39–117)
Bilirubin, Direct: 0.2 mg/dL (ref 0.0–0.3)
Total Bilirubin: 0.6 mg/dL (ref 0.2–1.2)
Total Protein: 6.4 g/dL (ref 6.0–8.3)

## 2024-06-17 LAB — CBC WITH DIFFERENTIAL/PLATELET
Basophils Absolute: 0.1 K/uL (ref 0.0–0.1)
Basophils Relative: 0.6 % (ref 0.0–3.0)
Eosinophils Absolute: 0.2 K/uL (ref 0.0–0.7)
Eosinophils Relative: 1.3 % (ref 0.0–5.0)
HCT: 42.9 % (ref 36.0–46.0)
Hemoglobin: 14.1 g/dL (ref 12.0–15.0)
Lymphocytes Relative: 19.7 % (ref 12.0–46.0)
Lymphs Abs: 2.6 K/uL (ref 0.7–4.0)
MCHC: 32.8 g/dL (ref 30.0–36.0)
MCV: 83 fl (ref 78.0–100.0)
Monocytes Absolute: 0.7 K/uL (ref 0.1–1.0)
Monocytes Relative: 5.5 % (ref 3.0–12.0)
Neutro Abs: 9.5 K/uL — ABNORMAL HIGH (ref 1.4–7.7)
Neutrophils Relative %: 72.9 % (ref 43.0–77.0)
Platelets: 231 K/uL (ref 150.0–400.0)
RBC: 5.16 Mil/uL — ABNORMAL HIGH (ref 3.87–5.11)
RDW: 15.9 % — ABNORMAL HIGH (ref 11.5–15.5)
WBC: 13 K/uL — ABNORMAL HIGH (ref 4.0–10.5)

## 2024-06-17 LAB — GAMMA GT: GGT: 22 U/L (ref 7–51)

## 2024-06-18 ENCOUNTER — Ambulatory Visit: Payer: Self-pay | Admitting: Internal Medicine

## 2024-06-18 ENCOUNTER — Other Ambulatory Visit: Payer: Self-pay | Admitting: Internal Medicine

## 2024-06-18 ENCOUNTER — Encounter

## 2024-06-18 DIAGNOSIS — Z955 Presence of coronary angioplasty implant and graft: Secondary | ICD-10-CM

## 2024-06-18 DIAGNOSIS — E876 Hypokalemia: Secondary | ICD-10-CM

## 2024-06-18 DIAGNOSIS — I214 Non-ST elevation (NSTEMI) myocardial infarction: Secondary | ICD-10-CM

## 2024-06-18 NOTE — Telephone Encounter (Signed)
 Copied from CRM #[id number]. Topic: General - Other >> Jun 18, 2024 12:46 PM Heather [name] wrote: Reason for CRM: pt called back in regrds to missed call, I read the note to the pt she also says the you are not sure that the potassium was rechecked. Pt needs a call back as soon as possible. [phone]

## 2024-06-18 NOTE — Progress Notes (Signed)
 Daily Session Note  Patient Details  Name: Heather [name] MRN: [id number] Date of Birth: [birth year] Referring Provider:   Flowsheet Row Cardiac Rehab from 04/03/2024 in [facility]  Referring Provider Florencio Kava, MD    Encounter Date: 06/18/2024  Check In:  Session Check In - 06/18/24 1547       Check-In   Supervising physician immediately available to respond to emergencies See telemetry face sheet for immediately available ER MD    Location [facility]-Cardiac & Pulmonary Rehab    Staff Present Burnard Davenport RN,BSN,MPA;Meredith Tressa RN,BSN;Laura Cates RN,BSN;[facility] BS, ACSM CEP, Exercise Physiologist    Virtual Visit No    Medication changes reported     No    Fall or balance concerns reported    No    Warm-up and Cool-down Performed on first and last piece of equipment    Resistance Training Performed Yes    VAD Patient? No    PAD/SET Patient? No      Pain Assessment   Currently in Pain? No/denies             Social History   Tobacco Use  Smoking Status Former   Current packs/day: 0.00   Average packs/day: 0.5 packs/day for 5.0 years (2.5 ttl pk-yrs)   Types: Cigarettes   Start date: 2008   Quit date: 2013   Years since quitting: 12.7  Smokeless Tobacco Never  Tobacco Comments   Quit mid October 2014    Goals Met:  Independence with exercise equipment Exercise tolerated well No report of concerns or symptoms today Strength training completed today  Goals Unmet:  Not Applicable  Comments: Pt able to follow exercise prescription today without complaint.  Will continue to monitor for progression.    Dr. Oneil Pinal is Medical Director for [facility] Cardiac Rehabilitation.  Dr. Fuad Aleskerov is Medical Director for [facility] Pulmonary Rehabilitation.

## 2024-06-18 NOTE — Telephone Encounter (Signed)
 Copied from CRM #[id number]. Topic: Clinical - Lab/Test Results >> Jun 18, 2024  3:05 PM Deaijah H wrote: Reason for CRM: Patient called in to return Dr. Freda call. Please call [phone]

## 2024-06-18 NOTE — Progress Notes (Signed)
 Order placed for f/u potassium check - add on lab

## 2024-06-18 NOTE — Progress Notes (Signed)
 Cardiac Individual Treatment Plan  Patient Details  Name: Heather [name] MRN: [id number] Date of Birth: 06-15-1976 Referring Provider:   Flowsheet Row Cardiac Rehab from 04/03/2024 in [facility] Cardiac and Pulmonary Rehab  Referring Provider Florencio Kava, MD    Initial Encounter Date:  Flowsheet Row Cardiac Rehab from 04/03/2024 in [facility] Cardiac and Pulmonary Rehab  Date 04/03/24    Visit Diagnosis: Status post coronary artery stent placement  NSTEMI (non-ST elevated myocardial infarction) [facility])  Patient's Home Medications on Admission:  Current Outpatient Medications:    albuterol  (VENTOLIN  HFA) 108 (90 Base) MCG/ACT inhaler, INHALE 1 TO 2 PUFFS BY MOUTH EVERY 4 HOURS AS NEEDED FOR WHEEZING FOR SHORTNESS OF BREATH, Disp: 18 g, Rfl: 1   Albuterol -Budesonide  (AIRSUPRA ) 90-80 MCG/ACT AERO, INHALE 2 PUFFS EVERY 6 HOURS, Disp: 11 g, Rfl: 6   ascorbic acid (VITAMIN C) 1000 MG tablet, Take 500 mg by mouth daily., Disp: , Rfl:    aspirin  EC 81 MG tablet, Take 1 tablet (81 mg total) by mouth daily. Swallow whole., Disp: , Rfl:    benzonatate  (TESSALON ) 100 MG capsule, Take 1 capsule (100 mg total) by mouth every 8 (eight) hours. (Patient taking differently: Take 100 mg by mouth every 8 (eight) hours as needed for cough.), Disp: 21 capsule, Rfl: 0   cetirizine  (ZYRTEC ) 10 MG tablet, Take 1 tablet by mouth once daily, Disp: 90 tablet, Rfl: 3   cyanocobalamin  (VITAMIN B12) 1000 MCG/ML injection, Inject 1,000 mcg into the muscle every 30 (thirty) days., Disp: , Rfl:    diazepam  (VALIUM ) 10 MG tablet, Take 1 tablet by mouth 4 (four) times daily as needed., Disp: , Rfl:    divalproex  (DEPAKOTE  ER) 500 MG 24 hr tablet, Take 1,500 mg by mouth at bedtime., Disp: , Rfl:    Dupilumab  (DUPIXENT ) 300 MG/2ML SOAJ, Inject 300 mg into the skin every 14 (fourteen) days. **loading dose completed in clinic on 01/04/2024**, Disp: 12 mL, Rfl: 1   ezetimibe (ZETIA) 10 MG tablet, Take 1 tablet by mouth daily., Disp: ,  Rfl:    fluticasone  (FLONASE ) 50 MCG/ACT nasal spray, Place 1 spray into both nostrils daily., Disp: 48 g, Rfl: 1   fluticasone -salmeterol (ADVAIR) 500-50 MCG/ACT AEPB, Inhale 1 puff into the lungs in the morning and at bedtime., Disp: 60 each, Rfl: 11   ipratropium (ATROVENT ) 0.03 % nasal spray, Place 2 sprays into both nostrils every 12 (twelve) hours., Disp: 30 mL, Rfl: 12   ipratropium-albuterol  (DUONEB) 0.5-2.5 (3) MG/3ML SOLN, Inhale 3 mLs into the lungs every 4 (four) hours as needed., Disp: 360 mL, Rfl: 11   isosorbide mononitrate (IMDUR) 30 MG 24 hr tablet, Take 30 mg by mouth daily., Disp: , Rfl:    linaclotide  (LINZESS ) 290 MCG CAPS capsule, Take 1 capsule (290 mcg total) by mouth daily before breakfast. (Patient taking differently: Take 290 mcg by mouth daily as needed.), Disp: 30 capsule, Rfl: 1   losartan (COZAAR) 25 MG tablet, Take 25 mg by mouth daily., Disp: , Rfl:    metoprolol succinate (TOPROL-XL) 25 MG 24 hr tablet, Take 1 tablet by mouth daily., Disp: , Rfl:    montelukast  (SINGULAIR ) 10 MG tablet, Take 1 tablet (10 mg total) by mouth at bedtime., Disp: 30 tablet, Rfl: 11   nitroGLYCERIN  (NITROSTAT ) 0.4 MG SL tablet, Place 0.4 mg under the tongue every 5 (five) minutes as needed., Disp: , Rfl:    nitroGLYCERIN  0.2 mg/mL infusion, Inject 0-200 mcg/min into the vein continuous., Disp: , Rfl:  omeprazole  (PRILOSEC) 40 MG capsule, TAKE 1 CAPSULE BY MOUTH ONCE DAILY BEFORE BREAKFAST, Disp: 30 capsule, Rfl: 1   ondansetron  (ZOFRAN -ODT) 4 MG disintegrating tablet, DISSOLVE 1 TABLET IN MOUTH TWICE DAILY AS NEEDED FOR NAUSEA AND  VOMITING, Disp: 20 tablet, Rfl: 0   prasugrel (EFFIENT) 10 MG TABS tablet, Take 1 tablet by mouth daily., Disp: , Rfl:    rosuvastatin (CRESTOR) 40 MG tablet, Take 40 mg by mouth daily., Disp: , Rfl:    Vitamin D , Ergocalciferol , (DRISDOL ) 1.25 MG (50000 UNIT) CAPS capsule, Take 1 capsule (50,000 Units total) by mouth every 7 (seven) days., Disp: 12 capsule,  Rfl: 0  Past Medical History: 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 hauler    Depression    bipolar   Dysrhythmia    Fainting spell    H/O   H/O cardiac arrhythmia    Migraines    Seizures (HCC)     Tobacco Use: Social History   Tobacco Use  Smoking Status Former   Current packs/day: 0.00   Average packs/day: 0.5 packs/day for 5.0 years (2.5 ttl pk-yrs)   Types: Cigarettes   Start date: 2008   Quit date: 2013   Years since quitting: 12.7  Smokeless Tobacco Never  Tobacco Comments   Quit mid October 2014    Labs: Review Flowsheet  More data exists      Latest Ref Rng & Units 08/29/2021 07/10/2022 11/07/2023 02/21/2024 05/30/2024  Labs for ITP Cardiac and Pulmonary Rehab  Cholestrol 100 - 199 mg/dL 793  792  800  812  95   LDL (calc) 0 - 99 mg/dL 865  - 898  893  31   Direct LDL mg/dL - 863.9  - - -  HDL-C >60 mg/dL 60.79  63.39  61.39  35  41   Trlycerides 0 - 149 mg/dL 835.9  649.9  705.9  769  129   Hemoglobin A1c 4.8 - 5.6 % - - - 4.7  -     Exercise Target Goals: Exercise Program Goal: Individual exercise prescription set using results from initial 6 min walk test and THRR while considering  patient's activity barriers and safety.   Exercise Prescription Goal: Initial exercise prescription builds to 30-45 minutes a day of aerobic activity, 2-3 days per week.  Home exercise guidelines will be given to patient during program as part of exercise prescription that the participant will acknowledge.   Education: Aerobic Exercise: - Group verbal and visual presentation on the components of exercise prescription. Introduces F.I.T.T principle from ACSM for exercise prescriptions.  Reviews F.I.T.T. principles of aerobic exercise including progression. Written material provided at class time.   Education: Resistance Exercise: - Group verbal and visual presentation on the components of exercise prescription. Introduces F.I.T.T principle from  ACSM for exercise prescriptions  Reviews F.I.T.T. principles of resistance exercise including progression. Written material provided at class time. Flowsheet Row Cardiac Rehab from 04/23/2024 in [facility] Cardiac and Pulmonary Rehab  Date 04/23/24  Educator [facility]  Instruction Review Code 1- [employer] Understanding     Education: Exercise & Equipment Safety: - Individual verbal instruction and demonstration of equipment use and safety with use of the equipment. Flowsheet Row Cardiac Rehab from 04/23/2024 in [facility] Cardiac and Pulmonary Rehab  Date 04/03/24  Educator [facility]  Instruction Review Code 1- Verbalizes Understanding    Education: Exercise Physiology & General Exercise Guidelines: - Group verbal and written instruction with models to review the exercise  physiology of the cardiovascular system and associated critical values. Provides general exercise guidelines with specific guidelines to those with heart or lung disease. Written material provided at class time. Flowsheet Row Cardiac Rehab from 04/23/2024 in [facility] Cardiac and Pulmonary Rehab  Date 04/16/24  Educator [facility]  Instruction Review Code 1- [employer] Understanding    Education: Flexibility, Balance, Mind/Body Relaxation: - Group verbal and visual presentation with interactive activity on the components of exercise prescription. Introduces F.I.T.T principle from ACSM for exercise prescriptions. Reviews F.I.T.T. principles of flexibility and balance exercise training including progression. Also discusses the mind body connection.  Reviews various relaxation techniques to help reduce and manage stress (i.e. Deep breathing, progressive muscle relaxation, and visualization). Balance handout provided to take home. Written material provided at class time. Flowsheet Row Cardiac Rehab from 04/23/2024 in [facility] Cardiac and Pulmonary Rehab  Date 04/23/24  Educator [facility]  Instruction Review Code 1- Verbalizes Understanding    Activity Barriers & Risk  Stratification:  Activity Barriers & Cardiac Risk Stratification - 04/03/24 1045       Activity Barriers & Cardiac Risk Stratification   Activity Barriers Joint Problems;Back Problems;Other (comment);Balance Concerns    Comments Long Covid- lower extremity issues (aches/hurts/impacts balance sometimes)    Cardiac Risk Stratification Moderate          6 Minute Walk:  6 Minute Walk     Row Name 04/03/24 1043         6 Minute Walk   Phase Initial     Distance 965 feet     Walk Time 6 minutes     # of Rest Breaks 0     MPH 1.83     METS 3.42     RPE 11     Perceived Dyspnea  1     VO2 Peak 11.98     Symptoms Yes (comment)     Comments aching legs and fatigue (used railing some)     Resting HR 66 bpm     Resting BP 144/90     Resting Oxygen Saturation  97 %     Exercise Oxygen Saturation  during 6 min walk 97 %     Max Ex. HR 90 bpm     Max Ex. BP 150/88     2 Minute Post BP 130/88        Oxygen Initial Assessment:   Oxygen Re-Evaluation:   Oxygen Discharge (Final Oxygen Re-Evaluation):   Initial Exercise Prescription:  Initial Exercise Prescription - 04/03/24 1000       Date of Initial Exercise RX and Referring Provider   Date 04/03/24    Referring Provider Florencio Kava, MD      Oxygen   Maintain Oxygen Saturation 88% or higher      Treadmill   MPH 1.8   Try   Grade 0    Minutes 15    METs 2.38      Recumbant Bike   Level 3    RPM 50    Watts 25    Minutes 15    METs 3.42      NuStep   Level 3    SPM 80    Minutes 15    METs 3.42      T5 Nustep   Level 3    SPM 80    Minutes 15    METs 3.42      Track   Laps 23    Minutes 15    METs 2.25  Prescription Details   Frequency (times per week) 3    Duration Progress to 30 minutes of continuous aerobic without signs/symptoms of physical distress      Intensity   THRR 40-80% of Max Heartrate 108-150    Ratings of Perceived Exertion 11-13    Perceived Dyspnea 0-4       Progression   Progression Continue to progress workloads to maintain intensity without signs/symptoms of physical distress.      Resistance Training   Training Prescription Yes    Weight 5 lb    Reps 10-15          Perform Capillary Blood Glucose checks as needed.  Exercise Prescription Changes:   Exercise Prescription Changes     Row Name 04/03/24 1000 04/15/24 1400 04/29/24 1300 05/14/24 0900 05/29/24 1600     Response to Exercise   Blood Pressure (Admit) 144/90 122/80 128/78 130/82 124/84   Blood Pressure (Exercise) 150/88 142/76 150/80 148/76 162/80   Blood Pressure (Exit) 130/88 110/80 120/70 126/70 98/58   Heart Rate (Admit) 66 bpm 81 bpm 99 bpm 96 bpm 66 bpm   Heart Rate (Exercise) 90 bpm 122 bpm 134 bpm 119 bpm 153 bpm   Heart Rate (Exit) 72 bpm 60 bpm 102 bpm 87 bpm 101 bpm   Oxygen Saturation (Admit) 97 % -- -- -- --   Oxygen Saturation (Exercise) 97 % -- -- -- --   Oxygen Saturation (Exit) 96 % -- -- -- --   Rating of Perceived Exertion (Exercise) 11 15 15 15 17    Perceived Dyspnea (Exercise) 1 -- -- -- --   Symptoms aching legs and fatigue none none none none   Comments results 1st day of exercise -- -- --   Duration -- Progress to 30 minutes of  aerobic without signs/symptoms of physical distress Continue with 30 min of aerobic exercise without signs/symptoms of physical distress. Continue with 30 min of aerobic exercise without signs/symptoms of physical distress. Continue with 30 min of aerobic exercise without signs/symptoms of physical distress.   Intensity THRR New THRR unchanged THRR unchanged THRR unchanged THRR unchanged     Progression   Progression -- Continue to progress workloads to maintain intensity without signs/symptoms of physical distress. Continue to progress workloads to maintain intensity without signs/symptoms of physical distress. Continue to progress workloads to maintain intensity without signs/symptoms of physical distress. Continue to  progress workloads to maintain intensity without signs/symptoms of physical distress.   Average METs 3.42 3.17 3.31 2.8 2.62     Resistance Training   Training Prescription -- Yes Yes Yes Yes   Weight -- 5 lb 5 lb 5 lb 5 lb   Reps -- 10-15 10-15 10-15 10-15     Interval Training   Interval Training -- No No No No     Treadmill   MPH -- 3.3 3 -- 2.3   Grade -- 0 6 -- 2.5   Minutes -- 15 15 -- 15   METs -- 3.53 5.78 -- 3.55     Recumbant Bike   Level -- -- 2 5 --   Watts -- -- 25 33 --   Minutes -- -- 15 15 --   METs -- -- 2.91 3.24 --     NuStep   Level -- 3 3 4  --   Minutes -- 15 115 30 --   METs -- 2.8 3.3 2.9 --     T5 Nustep   Level -- -- 4 -- 4  Minutes -- -- 15 -- 15   METs -- -- 2 -- 1.4     Track   Laps -- -- -- 23 --   Minutes -- -- -- 15 --   METs -- -- -- 2.25 --     Oxygen   Maintain Oxygen Saturation -- 88% or higher 88% or higher 88% or higher 88% or higher      Exercise Comments:   Exercise Comments     Row Name 04/09/24 1539           Exercise Comments First full day of exercise!  Patient was oriented to gym and equipment including functions, settings, policies, and procedures.  Patient's individual exercise prescription and treatment plan were reviewed.  All starting workloads were established based on the results of the 6 minute walk test done at initial orientation visit.  The plan for exercise progression was also introduced and progression will be customized based on patient's performance and goals.          Exercise Goals and Review:   Exercise Goals     Row Name 04/03/24 1048             Exercise Goals   Increase Physical Activity Yes       Intervention Provide advice, education, support and counseling about physical activity/exercise needs.;Develop an individualized exercise prescription for aerobic and resistive training based on initial evaluation findings, risk stratification, comorbidities and participant's personal  goals.       Expected Outcomes Short Term: Attend rehab on a regular basis to increase amount of physical activity.;Long Term: Add in home exercise to make exercise part of routine and to increase amount of physical activity.;Long Term: Exercising regularly at least 3-5 days a week.       Increase Strength and Stamina Yes       Intervention Provide advice, education, support and counseling about physical activity/exercise needs.;Develop an individualized exercise prescription for aerobic and resistive training based on initial evaluation findings, risk stratification, comorbidities and participant's personal goals.       Expected Outcomes Short Term: Increase workloads from initial exercise prescription for resistance, speed, and METs.;Short Term: Perform resistance training exercises routinely during rehab and add in resistance training at home;Long Term: Improve cardiorespiratory fitness, muscular endurance and strength as measured by increased METs and functional capacity ( )       Able to understand and use rate of perceived exertion (RPE) scale Yes       Intervention Provide education and explanation on how to use RPE scale       Expected Outcomes Short Term: Able to use RPE daily in rehab to express subjective intensity level;Long Term:  Able to use RPE to guide intensity level when exercising independently       Able to understand and use Dyspnea scale Yes       Intervention Provide education and explanation on how to use Dyspnea scale       Expected Outcomes Short Term: Able to use Dyspnea scale daily in rehab to express subjective sense of shortness of breath during exertion;Long Term: Able to use Dyspnea scale to guide intensity level when exercising independently       Knowledge and understanding of Target Heart Rate Range (THRR) Yes       Intervention Provide education and explanation of THRR including how the numbers were predicted and where they are located for reference       Expected  Outcomes Short Term: Able to state/look  up THRR;Short Term: Able to use daily as guideline for intensity in rehab;Long Term: Able to use THRR to govern intensity when exercising independently       Able to check pulse independently Yes       Intervention Provide education and demonstration on how to check pulse in carotid and radial arteries.;Review the importance of being able to check your own pulse for safety during independent exercise       Expected Outcomes Short Term: Able to explain why pulse checking is important during independent exercise;Long Term: Able to check pulse independently and accurately       Understanding of Exercise Prescription Yes       Intervention Provide education, explanation, and written materials on patient's individual exercise prescription       Expected Outcomes Short Term: Able to explain program exercise prescription;Long Term: Able to explain home exercise prescription to exercise independently          Exercise Goals Re-Evaluation :  Exercise Goals Re-Evaluation     Row Name 04/09/24 1539 04/15/24 1448 04/29/24 1400 05/14/24 0952 05/19/24 1645     Exercise Goal Re-Evaluation   Exercise Goals Review Increase Physical Activity;Able to understand and use rate of perceived exertion (RPE) scale;Knowledge and understanding of Target Heart Rate Range (THRR);Understanding of Exercise Prescription;Increase Strength and Stamina;Able to understand and use Dyspnea scale;Able to check pulse independently Increase Physical Activity;Understanding of Exercise Prescription;Increase Strength and Stamina Increase Physical Activity;Understanding of Exercise Prescription;Increase Strength and Stamina Increase Physical Activity;Understanding of Exercise Prescription;Increase Strength and Stamina Increase Physical Activity;Able to understand and use rate of perceived exertion (RPE) scale;Knowledge and understanding of Target Heart Rate Range (THRR);Understanding of Exercise  Prescription;Increase Strength and Stamina;Able to understand and use Dyspnea scale;Able to check pulse independently   Comments Reviewed RPE and dyspnea scale, THR and program prescription with pt today.  Pt voiced understanding and was given a copy of goals to take home. Joyceline is off to a good start in the program and completed her first day in this review. She worked at level 3 on the T4 nustep. She was able to do the treadmill and worked at a speed of 3.3 mph with no incline. We will continue to monitor her progress in the program. Jozelynn is doing well in the program. She recently increased her treadmill workload to a speed of 3 mph with a 6% incline. She also improved to level 4 on the T5 nustep. We will continue to monitor her progress in the program. Freada has only attended rehab twice since the last review. She was able to improve to level 5 on the recumbent bike and level 4 on the T4 nustep. She also did well walking 23 laps on the track. We will continue to monitor her progress in the program Reviewed home exercise with pt today.  Pt plans to do dumbbells for resistance, walking loop inside, might get a walking pad with a handle. and balance exercises for exercise. She plans to add 1 additional day of exercise at home.  Reviewed THR, pulse, RPE, sign and symptoms, pulse oximetery and when to call 911 or MD.  Also discussed weather considerations and indoor options.  Pt voiced understanding.   Expected Outcomes Short: Use RPE daily to regulate intensity. Long: Follow program prescription in THR. Short: Continue to follow current exercise prescription. Long: Continue exercise to improve strength and stamina. Short: Continue to progressively increase workloads. Long: Continue exercise to improve strength and stamina. Short: Continue to progressively increase  workloads. Long: Continue exercise to improve strength and stamina. Short: Add 1 additional day of exercise at home. Long: Continue to exercise at home  independently.    Row Name 05/29/24 1632 06/12/24 1401           Exercise Goal Re-Evaluation   Exercise Goals Review Increase Physical Activity;Increase Strength and Stamina;Understanding of Exercise Prescription Increase Physical Activity;Increase Strength and Stamina;Understanding of Exercise Prescription      Comments Karie continues to do well in rehab. She was able to maintain her relative workload on the treadmill at a speed of 2. and an incline of 2.5%. She was also able to maintain level 4 on the T5 nustep. We will continue to monitor her progress in the program. Everleigh did not attend rehab during the last review period. She did return to rehab on 06/09/2024. We will continue to monitor her progress in the program.      Expected Outcomes Short: Continue to increase treadmill workload. Long: Continue exercise to improve strength and stamina. Short: Return to consistent attendance in rehab. Long: Graduate.         Discharge Exercise Prescription (Final Exercise Prescription Changes):  Exercise Prescription Changes - 05/29/24 1600       Response to Exercise   Blood Pressure (Admit) 124/84    Blood Pressure (Exercise) 162/80    Blood Pressure (Exit) 98/58    Heart Rate (Admit) 66 bpm    Heart Rate (Exercise) 153 bpm    Heart Rate (Exit) 101 bpm    Rating of Perceived Exertion (Exercise) 17    Symptoms none    Duration Continue with 30 min of aerobic exercise without signs/symptoms of physical distress.    Intensity THRR unchanged      Progression   Progression Continue to progress workloads to maintain intensity without signs/symptoms of physical distress.    Average METs 2.62      Resistance Training   Training Prescription Yes    Weight 5 lb    Reps 10-15      Interval Training   Interval Training No      Treadmill   MPH 2.3    Grade 2.5    Minutes 15    METs 3.55      T5 Nustep   Level 4    Minutes 15    METs 1.4      Oxygen   Maintain Oxygen Saturation  88% or higher          Nutrition:  Target Goals: Understanding of nutrition guidelines, daily intake of sodium 1500mg , cholesterol 200mg , calories 30% from fat and 7% or less from saturated fats, daily to have 5 or more servings of fruits and vegetables.  Education: Nutrition 1 -Group instruction provided by verbal, written material, interactive activities, discussions, models, and posters to present general guidelines for heart healthy nutrition including macronutrients, label reading, and promoting whole foods over processed counterparts. Education serves as Pensions consultant of discussion of heart healthy eating for all. Written material provided at class time.    Education: Nutrition 2 -Group instruction provided by verbal, written material, interactive activities, discussions, models, and posters to present general guidelines for heart healthy nutrition including sodium, cholesterol, and saturated fat. Providing guidance of habit forming to improve blood pressure, cholesterol, and body weight. Written material provided at class time.     Biometrics:  Pre Biometrics - 04/03/24 1048       Pre Biometrics   Height 5' 7.4 (1.712 m)    Weight  191 lb 14.4 oz (87 kg)    Waist Circumference 41.9 inches    Hip Circumference 47 inches    Waist to Hip Ratio 0.89 %    BMI (Calculated) 29.7    Single Leg Stand 23 seconds           Nutrition Therapy Plan and Nutrition Goals:  Nutrition Therapy & Goals - 04/03/24 1049       Nutrition Therapy   RD appointment deferred Yes      Personal Nutrition Goals   Nutrition Goal RD appointment deferred at this time, will think about maybe scheduling in the future      Intervention Plan   Intervention Prescribe, educate and counsel regarding individualized specific dietary modifications aiming towards targeted core components such as weight, hypertension, lipid management, diabetes, heart failure and other comorbidities.    Expected Outcomes  Short Term Goal: Understand basic principles of dietary content, such as calories, fat, sodium, cholesterol and nutrients.          Nutrition Assessments:  MEDIFICTS Score Key: >=70 Need to make dietary changes  40-70 Heart Healthy Diet <= 40 Therapeutic Level Cholesterol Diet  Flowsheet Row Cardiac Rehab from 04/02/2024 in [facility] Cardiac and Pulmonary Rehab  Picture Your Plate Total Score on Admission 59   Picture Your Plate Scores: <59 Unhealthy dietary pattern with much room for improvement. 41-50 Dietary pattern unlikely to meet recommendations for good health and room for improvement. 51-60 More healthful dietary pattern, with some room for improvement.  >60 Healthy dietary pattern, although there may be some specific behaviors that could be improved.    Nutrition Goals Re-Evaluation:  Nutrition Goals Re-Evaluation     Row Name 05/19/24 1701             Goals   Nutrition Goal RD appointment deferred at this time          Nutrition Goals Discharge (Final Nutrition Goals Re-Evaluation):  Nutrition Goals Re-Evaluation - 05/19/24 1701       Goals   Nutrition Goal RD appointment deferred at this time          Psychosocial: Target Goals: Acknowledge presence or absence of significant depression and/or stress, maximize coping skills, provide positive support system. Participant is able to verbalize types and ability to use techniques and skills needed for reducing stress and depression.   Education: Stress, Anxiety, and Depression - Group verbal and visual presentation to define topics covered.  Reviews how body is impacted by stress, anxiety, and depression.  Also discusses healthy ways to reduce stress and to treat/manage anxiety and depression. Written material provided at class time. Flowsheet Row Cardiac Rehab from 04/23/2024 in [facility] Cardiac [facility]  Date 04/09/24  Educator mc  Instruction Review Code 1- Verbalizes Understanding    Education: Sleep  Hygiene -Provides group verbal and written instruction about how sleep can affect your health.  Define sleep hygiene, discuss sleep cycles and impact of sleep habits. Review good sleep hygiene tips.   Initial Review & Psychosocial Screening:  Initial Psych Review & Screening - 04/02/24 1117       Initial Review   Current issues with Current Stress Concerns;Current Psychotropic Meds    Source of Stress Concerns Chronic Illness;Unable to participate in former interests or hobbies;Unable to perform yard/household activities      Barriers   Psychosocial barriers to participate in program There are no identifiable barriers or psychosocial needs.      Screening Interventions   Interventions Encouraged  to exercise;To provide support and resources with identified psychosocial needs;Provide feedback about the scores to participant    Expected Outcomes Short Term goal: Utilizing psychosocial counselor, staff and physician to assist with identification of specific Stressors or current issues interfering with healing process. Setting desired goal for each stressor or current issue identified.;Long Term Goal: Stressors or current issues are controlled or eliminated.;Short Term goal: Identification and review with participant of any Quality of Life or Depression concerns found by scoring the questionnaire.;Long Term goal: The participant improves quality of Life and PHQ9 Scores as seen by post scores and/or verbalization of changes          Quality of Life Scores:   Scores of 19 and below usually indicate a poorer quality of life in these areas.  A difference of  2-3 points is a clinically meaningful difference.  A difference of 2-3 points in the total score of the Quality of Life Index has been associated with significant improvement in overall quality of life, self-image, physical symptoms, and general health in studies assessing change in quality of life.  PHQ-9: Review Flowsheet  More data exists       05/30/2024 05/19/2024 04/03/2024 04/27/2023 10/26/2022  Depression screen PHQ 2/9  Decreased Interest 3 3 3 1 3   Down, Depressed, Hopeless 1 1 1 1  0  PHQ - 2 Score 4 4 4 2 3   Altered sleeping 3 3 3  - 0  Tired, decreased energy 3 3 3  0 3  Change in appetite 1 0 0 0 0  Feeling bad or failure about yourself  1 1 1 1 1   Trouble concentrating 3 2 3 1 2   Moving slowly or fidgety/restless 1 2 1  0 0  Suicidal thoughts 0 0 0 0 0  PHQ-9 Score 16 15 15  - 9  Difficult doing work/chores Extremely dIfficult Extremely dIfficult Extremely dIfficult Extremely dIfficult Extremely dIfficult   Interpretation of Total Score  Total Score Depression Severity:  1-4 = Minimal depression, 5-9 = Mild depression, 10-14 = Moderate depression, 15-19 = Moderately severe depression, 20-27 = Severe depression   Psychosocial Evaluation and Intervention:  Psychosocial Evaluation - 04/02/24 1118       Psychosocial Evaluation & Interventions   Interventions Encouraged to exercise with the program and follow exercise prescription;Stress management education;Relaxation education    Comments Algie is coming to cardiac rehab 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 She is meeting with therapists to help with her long covid symptoms. She is on mood stabilizer medication as well to help with symptoms. She is wary of attending the program because she doesn't know if she will be able to finish it, but she wants to learn as much as possible. She is on long term disability from Covid because if she is very active, it takes her 2-3 days to recover. She is learning to take breaks and pace herself. She is being followed with the long covid clinic at [facility] which has been beneficial for her.    Expected Outcomes Short: attend cardiac rehab for education and exercise Long: develop and maintain positive self care habits    Continue  Psychosocial Services  Follow up required by staff          Psychosocial Re-Evaluation:  Psychosocial Re-Evaluation     Row Name 05/19/24 1652             Psychosocial  Re-Evaluation   Current issues with Current Stress Concerns       Comments Akili has current stress regarding her chronic illness. She is dealing with chronic fatigue and gets wiped out for multiple days after coming to rehab. She is learning to listen to her body and determine what her limits are to reduce the fatigue. We discussed home exercise and intermittent exercise sessions to do at home during her good days with rest in between or when she can not attend rehab, but wants to do some movement at home. She hopes this will help her stamina levels. She has a strong support system, and her occupational therapist has been a huge help in this current stressor and she meets with her on Tuesdays. She followed up on her PHQ screening and it stayed the same at 15 today.       Expected Outcomes Short: Add in some exercise at home and ways to reduce her fatigue after rehab. Long: Attend Cardiac Rehab for exercise benefits and education on stress management       Interventions Encouraged to attend Cardiac Rehabilitation for the exercise          Psychosocial Discharge (Final Psychosocial Re-Evaluation):  Psychosocial Re-Evaluation - 05/19/24 1652       Psychosocial Re-Evaluation   Current issues with Current Stress Concerns    Comments Ryelynn has current stress regarding her chronic illness. She is dealing with chronic fatigue and gets wiped out for multiple days after coming to rehab. She is learning to listen to her body and determine what her limits are to reduce the fatigue. We discussed home exercise and intermittent exercise sessions to do at home during her good days with rest in between or when she can not attend rehab, but wants to do some movement at home. She hopes this will help her stamina levels. She has a strong  support system, and her occupational therapist has been a huge help in this current stressor and she meets with her on Tuesdays. She followed up on her PHQ screening and it stayed the same at 15 today.    Expected Outcomes Short: Add in some exercise at home and ways to reduce her fatigue after rehab. Long: Attend Cardiac Rehab for exercise benefits and education on stress management    Interventions Encouraged to attend Cardiac Rehabilitation for the exercise          Vocational Rehabilitation: Provide vocational rehab assistance to qualifying candidates.   Vocational Rehab Evaluation & Intervention:  Vocational Rehab - 04/02/24 1117       Initial Vocational Rehab Evaluation & Intervention   Assessment shows need for Vocational Rehabilitation No          Education: Education Goals: Education classes will be provided on a variety of topics geared toward better understanding of heart health and risk factor modification. Participant will state understanding/return demonstration of topics presented as noted by education test scores.  Learning Barriers/Preferences:  Learning Barriers/Preferences - 04/02/24 1116       Learning Barriers/Preferences   Learning Barriers Exercise Concerns   Long Covid- brain fog   Learning Preferences Individual Instruction          General Cardiac Education Topics:  AED/CPR: - Group verbal and written instruction with the use of models to demonstrate the basic use of the AED with the basic ABC's of resuscitation.   Test and Procedures: - Group verbal and visual presentation and models provide information about basic cardiac anatomy and function.  Reviews the testing methods done to diagnose heart disease and the outcomes of the test results. Describes the treatment choices: Medical Management, Angioplasty, or Coronary Bypass Surgery for treating various heart conditions including Myocardial Infarction, Angina, Valve Disease, and Cardiac  Arrhythmias. Written material provided at class time.   Medication Safety: - Group verbal and visual instruction to review commonly prescribed medications for heart and lung disease. Reviews the medication, class of the drug, and side effects. Includes the steps to properly store meds and maintain the prescription regimen. Written material provided at class time.   Intimacy: - Group verbal instruction through game format to discuss how heart and lung disease can affect sexual intimacy. Written material provided at class time.   Know Your Numbers and Heart Failure: - Group verbal and visual instruction to discuss disease risk factors for cardiac and pulmonary disease and treatment options.  Reviews associated critical values for Overweight/Obesity, Hypertension, Cholesterol, and Diabetes.  Discusses basics of heart failure: signs/symptoms and treatments.  Introduces Heart Failure Zone chart for action plan for heart failure. Written material provided at class time.   Infection Prevention: - Provides verbal and written material to individual with discussion of infection control including proper hand washing and proper equipment cleaning during exercise session. Flowsheet Row Cardiac Rehab from 04/23/2024 in [facility] Cardiac [facility] Pul[facility] Rehab  Date 04/03/24  Educator MB  Instruction Review Code 1- Verbalizes Understanding    Falls Prevention: - Provides verbal and written material to individual with discussion of falls prevention and safety. Flowsheet Row Cardiac Rehab from 04/23/2024 in [facility] Cardiac and Pulmonary Rehab  Date 04/03/24  Educator MB  Instruction Review Code 1- Verbalizes Understanding    Other: -Provides group and verbal instruction on various topics (see comments)   Knowledge Questionnaire Score:   Core Components/Risk Factors/Patient Goals at Admission:  Personal Goals and Risk Factors at Admission - 04/03/24 1049       Core Components/Risk Factors/Patient Goals on  Admission    Weight Management Yes;Weight Loss    Intervention Weight Management: Develop a combined nutrition and exercise program designed to reach desired caloric intake, while maintaining appropriate intake of nutrient and fiber, sodium and fats, and appropriate energy expenditure required for the weight goal.;Weight Management: Provide education and appropriate resources to help participant work on and attain dietary goals.;Weight Management/Obesity: Establish reasonable short term and long term weight goals.    Admit Weight 191 lb 14.4 oz (87 kg)    Goal Weight: Short Term 169 lb 8 oz (76.9 kg)    Goal Weight: Long Term 148 lb (67.1 kg)    Expected Outcomes Short Term: Continue to assess and modify interventions until short term weight is achieved;Long Term: Adherence to nutrition and physical activity/exercise program aimed toward attainment of established weight goal;Weight Loss: Understanding of general recommendations for a balanced deficit meal plan, which promotes 1-2 lb weight loss per week and includes a negative energy balance of [phone] kcal/d;Understanding recommendations for meals to include 15-35% energy as protein, 25-35% energy from fat, 35-60% energy from carbohydrates, less than 200mg  of dietary cholesterol, 20-35 gm of total fiber daily;Understanding of distribution of calorie intake throughout the day with the consumption of 4-5 meals/snacks    Hypertension Yes    Intervention Provide education on lifestyle modifcations including regular physical activity/exercise, weight management, moderate sodium restriction and increased consumption of fresh fruit, vegetables, and low fat dairy, alcohol moderation, and smoking cessation.;Monitor prescription use compliance.    Expected Outcomes Short Term: Continued assessment and  intervention until BP is < 140/58mm HG in hypertensive participants. < 130/18mm HG in hypertensive participants with diabetes, heart failure or chronic kidney  disease.;Long Term: Maintenance of blood pressure at goal levels.    Lipids Yes    Intervention Provide education and support for participant on nutrition & aerobic/resistive exercise along with prescribed medications to achieve LDL 70mg , HDL >40mg .    Expected Outcomes Short Term: Participant states understanding of desired cholesterol values and is compliant with medications prescribed. Participant is following exercise prescription and nutrition guidelines.;Long Term: Cholesterol controlled with medications as prescribed, with individualized exercise RX and with personalized nutrition plan. Value goals: LDL < 70mg , HDL > 40 mg.          Education:Diabetes - Individual verbal and written instruction to review signs/symptoms of diabetes, desired ranges of glucose level fasting, after meals and with exercise. Acknowledge that pre and post exercise glucose checks will be done for 3 sessions at entry of program.   Core Components/Risk Factors/Patient Goals Review:   Goals and Risk Factor Review     Row Name 05/19/24 1701             Core Components/Risk Factors/Patient Goals Review   Personal Goals Review Weight Management/Obesity       Review Kailan is doing very well with her weight loss goal. She weighed 180.5 lbs today and is currently down 11 lbs from her admission weight of 191lb. She states she is eating better and trying to be consistent with her exercise when she can.       Expected Outcomes Short: Continue logging weight. Long: Continue to attend rehab, do home exericse, and work on healthy eating habits.          Core Components/Risk Factors/Patient Goals at Discharge (Final Review):   Goals and Risk Factor Review - 05/19/24 1701       Core Components/Risk Factors/Patient Goals Review   Personal Goals Review Weight Management/Obesity    Review Alaija is doing very well with her weight loss goal. She weighed 180.5 lbs today and is currently down 11 lbs from her admission  weight of 191lb. She states she is eating better and trying to be consistent with her exercise when she can.    Expected Outcomes Short: Continue logging weight. Long: Continue to attend rehab, do home exericse, and work on healthy eating habits.          ITP Comments:  ITP Comments     Row Name 04/02/24 1114 04/03/24 1043 04/09/24 1539 04/23/24 0943 05/21/24 0942   ITP Comments Initial phone call completed. Diagnosis can be found in [facility] 6/19. EP Orientation scheduled for Thursday 7/10 at 8:30. Completed and gym orientation for cardiac rehab. Initial ITP created and sent for review to Dr. Oneil Pinal, Medical Director. First full day of exercise!  Patient was oriented to gym and equipment including functions, settings, policies, and procedures.  Patient's individual exercise prescription and treatment plan were reviewed.  All starting workloads were established based on the results of the 6 minute walk test done at initial orientation visit.  The plan for exercise progression was also introduced and progression will be customized based on patient's performance and goals. 30 Day review completed. Medical Director ITP review done, changes made as directed, and signed approval by Medical Director. New to program. 30 Day review completed. Medical Director ITP review done; changes made as directed and signed approval by Medical Director.    Row Name 05/21/24 1438 06/18/24 1407  ITP Comments Antia called to state that she would not be coming to class today. She was feeling tired and stated she was having chest discomfort. She was advised to take her nitroglycerin  to see if it relieved her symptoms, and to call her doctor. She was also advised that if her symptoms were not relieved with nitroglycerin  to call 911. 30 Day review completed. Medical Director ITP review done; changes made as directed and signed approval by Medical Director.         Comments: 30 day review

## 2024-06-19 NOTE — Telephone Encounter (Addendum)
 Patient would like to go to a different vascular doctor. Can we send to referral to a different vascular office.

## 2024-06-19 NOTE — Addendum Note (Signed)
 Addended by: GLENDIA ALLENA RAMAN on: 06/19/2024 07:01 PM   Modules accepted: Orders

## 2024-06-19 NOTE — Telephone Encounter (Signed)
 Order placed for referral to [facility] vascular [facility].

## 2024-06-23 ENCOUNTER — Encounter

## 2024-06-24 ENCOUNTER — Other Ambulatory Visit (INDEPENDENT_AMBULATORY_CARE_PROVIDER_SITE_OTHER)

## 2024-06-24 ENCOUNTER — Telehealth: Payer: Self-pay

## 2024-06-24 ENCOUNTER — Other Ambulatory Visit (HOSPITAL_COMMUNITY): Payer: Self-pay

## 2024-06-24 DIAGNOSIS — E876 Hypokalemia: Secondary | ICD-10-CM

## 2024-06-24 NOTE — Telephone Encounter (Signed)
 PA renewal initiated automatically by CoverMyMeds.  Submitted a Prior Authorization request to [employer] for [employer]  via CoverMyMeds. Will update once we receive a response.   [id number]

## 2024-06-24 NOTE — Telephone Encounter (Signed)
 Received notification from [employer] regarding a prior authorization for DUPIXENT . Authorization has been APPROVED from 06/24/24 to 06/24/25. Approval letter sent to scan center.  Unable to run test claim due to connection error.  Authorization # [id number] Phone # [phone]  Pt has 2 insurances, will proceed with submitting pa on [employer] plan.

## 2024-06-25 ENCOUNTER — Ambulatory Visit: Payer: Self-pay | Admitting: Internal Medicine

## 2024-06-25 ENCOUNTER — Encounter

## 2024-06-25 LAB — POTASSIUM: Potassium: 4.2 meq/L (ref 3.5–5.1)

## 2024-06-27 ENCOUNTER — Other Ambulatory Visit (HOSPITAL_COMMUNITY): Payer: Self-pay

## 2024-06-27 NOTE — Telephone Encounter (Signed)
 Submitted a Prior Authorization request to [employer] for DUPIXENT  via CoverMyMeds. Authorization has been APPROVED from 05/28/24 to 06/27/25. Approval letter sent to scan center.   Patient can fill through [facility]: [phone]  Authorization#: [id number] Per test claim it seems pt would have to use [employer] to fill under this coverage. Pt has been filling at [facility] and likely using her other plan.

## 2024-06-30 ENCOUNTER — Encounter

## 2024-06-30 ENCOUNTER — Other Ambulatory Visit: Payer: Self-pay | Admitting: Internal Medicine

## 2024-06-30 DIAGNOSIS — J4541 Moderate persistent asthma with (acute) exacerbation: Secondary | ICD-10-CM

## 2024-07-01 ENCOUNTER — Ambulatory Visit: Admitting: Pulmonary Disease

## 2024-07-01 ENCOUNTER — Encounter: Payer: Self-pay | Admitting: Pulmonary Disease

## 2024-07-01 VITALS — BP 124/80 | HR 84 | Temp 97.5°F | Ht 67.0 in | Wt 174.8 lb

## 2024-07-01 DIAGNOSIS — J4541 Moderate persistent asthma with (acute) exacerbation: Secondary | ICD-10-CM

## 2024-07-01 DIAGNOSIS — J4550 Severe persistent asthma, uncomplicated: Secondary | ICD-10-CM | POA: Diagnosis not present

## 2024-07-01 LAB — PULMONARY FUNCTION TEST
DL/VA % pred: 110 %
DL/VA: 4.64 ml/min/mmHg/L
DLCO cor % pred: 90 %
DLCO cor: 21.27 ml/min/mmHg
DLCO unc % pred: 92 %
DLCO unc: 21.71 ml/min/mmHg
FEF 25-75 Post: 2.61 L/s
FEF 25-75 Pre: 1.53 L/s
FEF2575-%Change-Post: 71 %
FEF2575-%Pred-Post: 87 %
FEF2575-%Pred-Pre: 50 %
FEV1-%Change-Post: 14 %
FEV1-%Pred-Post: 70 %
FEV1-%Pred-Pre: 61 %
FEV1-Post: 2.24 L
FEV1-Pre: 1.95 L
FEV1FVC-%Change-Post: 3 %
FEV1FVC-%Pred-Pre: 93 %
FEV6-%Change-Post: 11 %
FEV6-%Pred-Post: 74 %
FEV6-%Pred-Pre: 66 %
FEV6-Post: 2.88 L
FEV6-Pre: 2.57 L
FEV6FVC-%Change-Post: 0 %
FEV6FVC-%Pred-Post: 102 %
FEV6FVC-%Pred-Pre: 102 %
FVC-%Change-Post: 11 %
FVC-%Pred-Post: 72 %
FVC-%Pred-Pre: 65 %
FVC-Post: 2.88 L
FVC-Pre: 2.59 L
Post FEV1/FVC ratio: 78 %
Post FEV6/FVC ratio: 100 %
Pre FEV1/FVC ratio: 75 %
Pre FEV6/FVC Ratio: 99 %
RV % pred: 126 %
RV: 2.46 L
TLC % pred: 95 %
TLC: 5.29 L

## 2024-07-01 MED ORDER — FLUTICASONE-SALMETEROL 230-21 MCG/ACT IN AERO: 2.0000 | INHALATION_SPRAY | Freq: Two times a day (BID) | RESPIRATORY_TRACT | 12 refills | Status: AC

## 2024-07-01 MED ORDER — FLUTICASONE PROPIONATE 50 MCG/ACT NA SUSP
1.0000 | Freq: Every day | NASAL | 1 refills | Status: AC
Start: 2024-07-01 — End: ?

## 2024-07-01 MED ORDER — AEROCHAMBER MV MISC: 0 refills | Status: AC

## 2024-07-01 MED ORDER — BREZTRI AEROSPHERE 160-9-4.8 MCG/ACT IN AERO: 2.0000 | INHALATION_SPRAY | Freq: Two times a day (BID) | RESPIRATORY_TRACT | 3 refills | Status: DC

## 2024-07-01 NOTE — Progress Notes (Signed)
 Full PFT completed today ? ?

## 2024-07-01 NOTE — Patient Instructions (Signed)
 Full PFT completed today ? ?

## 2024-07-01 NOTE — Progress Notes (Signed)
 Synopsis: Referred in by [name], MD   Subjective:   PATIENT ID: Heather [name] GENDER: female DOB: May 01, 1976, MRN: [id number]  No chief complaint on file.   HPI Expand All Collapse All    @Patient  ID: Heather [name], female    DOB: December 03, 1975, 48 y.o.   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    Chest x-ray January 2024 clear lungs Chest ray May 19, 2022 clear lungs  CTA chest 2025 with mild mosaic attenuation consistent with air trapping.    07/13/2023 Follow up ; Asthma  Patient presents for 1 year follow-up.  Patient has moderate persistent asthma.  Patient remains on Breztri  inhaler twice daily.  She remains on Zyrtec  and Singulair  daily since last visit patient says her breathing has been doing better with less cough and wheezing . Feels advair worked better than breztri .  Patient was set up for PFTs last visit but unfortunately have not been completed. No increased albuterol  /neb use .   Complains of 3-4 weeks of sinus congestion , drainage, scratchy throat, sinus pressure /teeth pain. Has been using OTC sinus meds without much help. Taking Zyrtec  , Claritin  and Flonase . Has not been taking Singulair  .      08/17/2023 Follow up, Asthma, post covid bronchitis.   12/20/2023 - Here for a follow up visit for asthma. Was doing well on Advair diskus 500 up until 2 weeks ago when she had a URI and her asthma flared up. Today in office with multiple coughing  spells. Unable to perform Feno.   04/03/2024 - Heather [name] is here to follow up regarding her asthma. She was in an exacerbation when I saw her last in March. Currently recovering well. Started on Dupixent  in April 2025. Discussed adding incruse ellipta  to her regimen and she is agreeable with that.   10.07.2025 - Heather [name] is here to follow regarding her asthma. She is doing well overall and feels that dupixent  is helping. However she feels like advair is causing her to cough more frequently. PFTs today with ventilatory defect without obstruction. Significant response to bronchodilators. Gas trapping and normal DLCO. Overall consistent with asthma. We discussed changing her inhaler to Advair HFA with a chamber which she is agreeable with. Finally, she is more compliant with her cpap now she feels slightly better however fatigue persists.   ROS All systems were reviewed and are negative except for the above.  Objective:   There were no vitals filed for this visit.     on RA BMI Readings from Last 3 Encounters:  07/01/24 27.38 kg/m  05/30/24 28.41 kg/m  04/03/24 29.10 kg/m   Wt Readings from Last 3 Encounters:  07/01/24 174 lb 12.8 oz (79.3 kg)  05/30/24 181 lb 6.4 oz (82.3 kg)  04/03/24 188 lb (85.3 kg)    Physical Exam GEN: NAD, coughing consistently with audible wheezing HEENT: Supple Neck, Reactive Pupils, EOMI  CVS: Normal S1, Normal S2, RRR, No murmurs or  ES appreciated  Lungs: Poor air movement with expiratory wheezing.  Abdomen: Soft, non tender, non distended, + BS  Extremities: Warm and well perfused, No edema  Skin: No suspicious lesions appreciated  Psych: Normal Affect  Ancillary Information   CBC    Component Value Date/Time   WBC 13.0 (H) 06/16/2024 1408   RBC 5.16 (H) 06/16/2024 1408   HGB 14.1 06/16/2024 1408   HGB 13.6 05/30/2024 1554   HCT 42.9 06/16/2024 1408   HCT 42.0 05/30/2024 1554   PLT 231.0 06/16/2024 1408   PLT 277 05/30/2024 1554   MCV  83.0 06/16/2024 1408   MCV 85 05/30/2024 1554   MCV 87 06/19/2014 1437   MCH 27.6 05/30/2024 1554   MCH 28.5 02/22/2024 0143   MCHC 32.8 06/16/2024 1408   RDW 15.9 (H) 06/16/2024 1408   RDW 13.8 05/30/2024 1554   RDW 14.2 06/19/2014 1437   LYMPHSABS 2.6 06/16/2024 1408   LYMPHSABS 4.5 (H) 05/30/2024 1554   MONOABS 0.7 06/16/2024 1408   EOSABS 0.2 06/16/2024 1408   EOSABS 0.1 05/30/2024 1554   BASOSABS 0.1 06/16/2024 1408   BASOSABS 0.2 05/30/2024 1554       Latest Ref Rng & Units 07/01/2024   11:03 AM  PFT Results  FVC-Pre L 2.59  P  FVC-Predicted Pre % 65  P  FVC-Post L 2.88  P  FVC-Predicted Post % 72  P  Pre FEV1/FVC % % 75  P  Post FEV1/FCV % % 78  P  FEV1-Pre L 1.95  P  FEV1-Predicted Pre % 61  P  FEV1-Post L 2.24  P  DLCO uncorrected ml/min/mmHg 21.71  P  DLCO UNC% % 92  P  DLCO corrected ml/min/mmHg 21.27  P  DLCO COR %Predicted % 90  P  DLVA Predicted % 110  P  TLC L 5.29  P  TLC % Predicted % 95  P  RV % Predicted % 126  P    P Preliminary result     Assessment & Plan:   #Severe Persistent Asthma  #Mild to moderate OSA EOS - 400 Total Ige - 55  Allergen panel + for multiple Ags most notable Dog dander.      Plan  Patient Instructions  []  switch to advair HFA 230 2puffs bid.  []  Prescribed airsupra  as needed.  []  Started on Dupixent  12/2023  []  C/w Singulair  10mg  At bedtime    []  Flonase  nasal 2 puffs BID  []  Continue with CPAP at bedtime. Improved compliance now at 97% in 30 days with improved AHI. However fatigue persists and she is trying to adapt slowly.   RTC 3 months   I spent 40 minutes caring for this patient today, including preparing to see the patient, obtaining a medical history , reviewing a separately obtained history, performing a medically appropriate examination and/or evaluation, counseling and educating the patient/family/caregiver, documenting clinical information in the electronic health record, and independently interpreting  results (not separately reported/billed) and communicating results to the patient/family/caregiver  Darrin Barn, MD [facility] 07/01/2024 12:50 PM

## 2024-07-02 ENCOUNTER — Encounter

## 2024-07-02 DIAGNOSIS — Z955 Presence of coronary angioplasty implant and graft: Secondary | ICD-10-CM

## 2024-07-02 NOTE — Progress Notes (Signed)
 Early Discharge Summary  Heather [name] 09-19-1976  [name] is discharging early due to chronic fatigue. She completed 11 of 36 sessions.    6 Minute Walk     Row Name 04/03/24 1043         6 Minute Walk   Phase Initial     Distance 965 feet     Walk Time 6 minutes     # of Rest Breaks 0     MPH 1.83     METS 3.42     RPE 11     Perceived Dyspnea  1     VO2 Peak 11.98     Symptoms Yes (comment)     Comments aching legs and fatigue (used railing some)     Resting HR 66 bpm     Resting BP 144/90     Resting Oxygen Saturation  97 %     Exercise Oxygen Saturation  during 6 min walk 97 %     Max Ex. HR 90 bpm     Max Ex. BP 150/88     2 Minute Post BP 130/88

## 2024-07-02 NOTE — Progress Notes (Signed)
 Cardiac Individual Treatment Plan  Patient Details  Name: Heather [name] MRN: [id number] Date of Birth: October 01, 1975 Referring Provider:   Flowsheet Row Cardiac Rehab from 04/03/2024 in [facility] Cardiac and Pulmonary Rehab  Referring Provider Florencio Kava, MD    Initial Encounter Date:  Flowsheet Row Cardiac Rehab from 04/03/2024 in [facility] Cardiac and Pulmonary Rehab  Date 04/03/24    Visit Diagnosis: Status post coronary artery stent placement  Patient's Home Medications on Admission:  Current Outpatient Medications:    albuterol  (VENTOLIN  HFA) 108 (90 Base) MCG/ACT inhaler, INHALE 1 TO 2 PUFFS BY MOUTH EVERY 4 HOURS AS NEEDED FOR WHEEZING FOR SHORTNESS OF BREATH, Disp: 18 g, Rfl: 1   Albuterol -Budesonide  (AIRSUPRA ) 90-80 MCG/ACT AERO, INHALE 2 PUFFS EVERY 6 HOURS, Disp: 11 g, Rfl: 6   ascorbic acid (VITAMIN C) 1000 MG tablet, Take 500 mg by mouth daily., Disp: , Rfl:    aspirin  EC 81 MG tablet, Take 1 tablet (81 mg total) by mouth daily. Swallow whole., Disp: , Rfl:    benzonatate  (TESSALON ) 100 MG capsule, Take 1 capsule (100 mg total) by mouth every 8 (eight) hours. (Patient taking differently: Take 100 mg by mouth every 8 (eight) hours as needed for cough.), Disp: 21 capsule, Rfl: 0   cetirizine  (ZYRTEC ) 10 MG tablet, Take 1 tablet by mouth once daily, Disp: 90 tablet, Rfl: 3   cyanocobalamin  (VITAMIN B12) 1000 MCG/ML injection, Inject 1,000 mcg into the muscle every 30 (thirty) days., Disp: , Rfl:    diazepam  (VALIUM ) 10 MG tablet, Take 1 tablet by mouth 4 (four) times daily as needed., Disp: , Rfl:    divalproex  (DEPAKOTE  ER) 500 MG 24 hr tablet, Take 1,500 mg by mouth at bedtime., Disp: , Rfl:    Dupilumab  (DUPIXENT ) 300 MG/2ML SOAJ, Inject 300 mg into the skin every 14 (fourteen) days. **loading dose completed in clinic on 01/04/2024**, Disp: 12 mL, Rfl: 1   ezetimibe (ZETIA) 10 MG tablet, Take 1 tablet by mouth daily., Disp: , Rfl:    fluticasone  (FLONASE ) 50 MCG/ACT nasal spray,  Place 1 spray into both nostrils daily., Disp: 48 g, Rfl: 1   fluticasone -salmeterol (ADVAIR HFA) 230-21 MCG/ACT inhaler, Inhale 2 puffs into the lungs 2 (two) times daily., Disp: 1 each, Rfl: 12   ipratropium (ATROVENT ) 0.03 % nasal spray, Place 2 sprays into both nostrils every 12 (twelve) hours., Disp: 30 mL, Rfl: 12   ipratropium-albuterol  (DUONEB) 0.5-2.5 (3) MG/3ML SOLN, Inhale 3 mLs into the lungs every 4 (four) hours as needed., Disp: 360 mL, Rfl: 11   isosorbide mononitrate (IMDUR) 30 MG 24 hr tablet, Take 30 mg by mouth daily., Disp: , Rfl:    linaclotide  (LINZESS ) 290 MCG CAPS capsule, Take 1 capsule (290 mcg total) by mouth daily before breakfast. (Patient taking differently: Take 290 mcg by mouth daily as needed.), Disp: 30 capsule, Rfl: 1   losartan (COZAAR) 25 MG tablet, Take 25 mg by mouth daily., Disp: , Rfl:    metoprolol succinate (TOPROL-XL) 25 MG 24 hr tablet, Take 1 tablet by mouth daily., Disp: , Rfl:    montelukast  (SINGULAIR ) 10 MG tablet, Take 1 tablet (10 mg total) by mouth at bedtime., Disp: 30 tablet, Rfl: 11   nitroGLYCERIN  (NITROSTAT ) 0.4 MG SL tablet, Place 0.4 mg under the tongue every 5 (five) minutes as needed., Disp: , Rfl:    nitroGLYCERIN  0.2 mg/mL infusion, Inject 0-200 mcg/min into the vein continuous., Disp: , Rfl:    omeprazole  (PRILOSEC) 40 MG capsule,  TAKE 1 CAPSULE BY MOUTH ONCE DAILY BEFORE BREAKFAST, Disp: 30 capsule, Rfl: 1   ondansetron  (ZOFRAN -ODT) 4 MG disintegrating tablet, DISSOLVE 1 TABLET IN MOUTH TWICE DAILY AS NEEDED FOR NAUSEA AND  VOMITING, Disp: 20 tablet, Rfl: 0   prasugrel (EFFIENT) 10 MG TABS tablet, Take 1 tablet by mouth daily., Disp: , Rfl:    rosuvastatin (CRESTOR) 40 MG tablet, Take 40 mg by mouth daily., Disp: , Rfl:    Spacer/Aero-Holding Chambers (AEROCHAMBER MV) inhaler, Use as instructed, Disp: 1 each, Rfl: 0   topiramate (TOPAMAX) 25 MG tablet, Take 50 mg by mouth 2 (two) times daily., Disp: , Rfl:    Vitamin D ,  Ergocalciferol , (DRISDOL ) 1.25 MG (50000 UNIT) CAPS capsule, Take 1 capsule (50,000 Units total) by mouth every 7 (seven) days., Disp: 12 capsule, Rfl: 0  Past Medical History: 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 hauler    Depression    bipolar   Dysrhythmia    Fainting spell    H/O   H/O cardiac arrhythmia    Migraines    Seizures (HCC)     Tobacco Use: Social History   Tobacco Use  Smoking Status Former   Current packs/day: 0.00   Average packs/day: 0.5 packs/day for 5.0 years (2.5 ttl pk-yrs)   Types: Cigarettes   Start date: 2008   Quit date: 2013   Years since quitting: 12.7  Smokeless Tobacco Never  Tobacco Comments   Quit mid October 2014    Labs: Review Flowsheet  More data exists      Latest Ref Rng & Units 08/29/2021 07/10/2022 11/07/2023 02/21/2024 05/30/2024  Labs for ITP Cardiac and Pulmonary Rehab  Cholestrol 100 - 199 mg/dL 793  792  800  812  95   LDL (calc) 0 - 99 mg/dL 865  - 898  893  31   Direct LDL mg/dL - 863.9  - - -  HDL-C >60 mg/dL 60.79  63.39  61.39  35  41   Trlycerides 0 - 149 mg/dL 835.9  649.9  705.9  769  129   Hemoglobin A1c 4.8 - 5.6 % - - - 4.7  -     Exercise Target Goals: Exercise Program Goal: Individual exercise prescription set using results from initial 6 min walk test and THRR while considering  patient's activity barriers and safety.   Exercise Prescription Goal: Initial exercise prescription builds to 30-45 minutes a day of aerobic activity, 2-3 days per week.  Home exercise guidelines will be given to patient during program as part of exercise prescription that the participant will acknowledge.   Education: Aerobic Exercise: - Group verbal and visual presentation on the components of exercise prescription. Introduces F.I.T.T principle from ACSM for exercise prescriptions.  Reviews F.I.T.T. principles of aerobic exercise including progression. Written material provided at class  time.   Education: Resistance Exercise: - Group verbal and visual presentation on the components of exercise prescription. Introduces F.I.T.T principle from ACSM for exercise prescriptions  Reviews F.I.T.T. principles of resistance exercise including progression. Written material provided at class time. Flowsheet Row Cardiac Rehab from 06/18/2024 in [facility] Cardiac and Pulmonary Rehab  Date 04/23/24  Educator [facility]  Instruction Review Code 1- [employer] Understanding     Education: Exercise & Equipment Safety: - Individual verbal instruction and demonstration of equipment use and safety with use of the equipment. Flowsheet Row Cardiac Rehab from 06/18/2024 in [facility] Cardiac and Pulmonary Rehab  Date 04/03/24  Educator  [facility]  Instruction Review Code 1- Verbalizes Understanding    Education: Exercise Physiology & General Exercise Guidelines: - Group verbal and written instruction with models to review the exercise physiology of the cardiovascular system and associated critical values. Provides general exercise guidelines with specific guidelines to those with heart or lung disease. Written material provided at class time. Flowsheet Row Cardiac Rehab from 06/18/2024 in [facility] Cardiac and Pulmonary Rehab  Date 04/16/24  Educator [facility]  Instruction Review Code 1- [employer] Understanding    Education: Flexibility, Balance, Mind/Body Relaxation: - Group verbal and visual presentation with interactive activity on the components of exercise prescription. Introduces F.I.T.T principle from ACSM for exercise prescriptions. Reviews F.I.T.T. principles of flexibility and balance exercise training including progression. Also discusses the mind body connection.  Reviews various relaxation techniques to help reduce and manage stress (i.e. Deep breathing, progressive muscle relaxation, and visualization). Balance handout provided to take home. Written material provided at class time. Flowsheet Row Cardiac Rehab from  06/18/2024 in [facility] Cardiac and Pulmonary Rehab  Date 04/23/24  Educator [facility]  Instruction Review Code 1- Verbalizes Understanding    Activity Barriers & Risk Stratification:  Activity Barriers & Cardiac Risk Stratification - 04/03/24 1045       Activity Barriers & Cardiac Risk Stratification   Activity Barriers Joint Problems;Back Problems;Other (comment);Balance Concerns    Comments Long Covid- lower extremity issues (aches/hurts/impacts balance sometimes)    Cardiac Risk Stratification Moderate          6 Minute Walk:  6 Minute Walk     Row Name 04/03/24 1043         6 Minute Walk   Phase Initial     Distance 965 feet     Walk Time 6 minutes     # of Rest Breaks 0     MPH 1.83     METS 3.42     RPE 11     Perceived Dyspnea  1     VO2 Peak 11.98     Symptoms Yes (comment)     Comments aching legs and fatigue (used railing some)     Resting HR 66 bpm     Resting BP 144/90     Resting Oxygen Saturation  97 %     Exercise Oxygen Saturation  during 6 min walk 97 %     Max Ex. HR 90 bpm     Max Ex. BP 150/88     2 Minute Post BP 130/88        Oxygen Initial Assessment:   Oxygen Re-Evaluation:   Oxygen Discharge (Final Oxygen Re-Evaluation):   Initial Exercise Prescription:  Initial Exercise Prescription - 04/03/24 1000       Date of Initial Exercise RX and Referring Provider   Date 04/03/24    Referring Provider Florencio Kava, MD      Oxygen   Maintain Oxygen Saturation 88% or higher      Treadmill   MPH 1.8   Try   Grade 0    Minutes 15    METs 2.38      Recumbant Bike   Level 3    RPM 50    Watts 25    Minutes 15    METs 3.42      NuStep   Level 3    SPM 80    Minutes 15    METs 3.42      T5 Nustep   Level 3    SPM 80  Minutes 15    METs 3.42      Track   Laps 23    Minutes 15    METs 2.25      Prescription Details   Frequency (times per week) 3    Duration Progress to 30 minutes of continuous aerobic without  signs/symptoms of physical distress      Intensity   THRR 40-80% of Max Heartrate 108-150    Ratings of Perceived Exertion 11-13    Perceived Dyspnea 0-4      Progression   Progression Continue to progress workloads to maintain intensity without signs/symptoms of physical distress.      Resistance Training   Training Prescription Yes    Weight 5 lb    Reps 10-15          Perform Capillary Blood Glucose checks as needed.  Exercise Prescription Changes:   Exercise Prescription Changes     Row Name 04/03/24 1000 04/15/24 1400 04/29/24 1300 05/14/24 0900 05/29/24 1600     Response to Exercise   Blood Pressure (Admit) 144/90 122/80 128/78 130/82 124/84   Blood Pressure (Exercise) 150/88 142/76 150/80 148/76 162/80   Blood Pressure (Exit) 130/88 110/80 120/70 126/70 98/58   Heart Rate (Admit) 66 bpm 81 bpm 99 bpm 96 bpm 66 bpm   Heart Rate (Exercise) 90 bpm 122 bpm 134 bpm 119 bpm 153 bpm   Heart Rate (Exit) 72 bpm 60 bpm 102 bpm 87 bpm 101 bpm   Oxygen Saturation (Admit) 97 % -- -- -- --   Oxygen Saturation (Exercise) 97 % -- -- -- --   Oxygen Saturation (Exit) 96 % -- -- -- --   Rating of Perceived Exertion (Exercise) 11 15 15 15 17    Perceived Dyspnea (Exercise) 1 -- -- -- --   Symptoms aching legs and fatigue none none none none   Comments results 1st day of exercise -- -- --   Duration -- Progress to 30 minutes of  aerobic without signs/symptoms of physical distress Continue with 30 min of aerobic exercise without signs/symptoms of physical distress. Continue with 30 min of aerobic exercise without signs/symptoms of physical distress. Continue with 30 min of aerobic exercise without signs/symptoms of physical distress.   Intensity THRR New THRR unchanged THRR unchanged THRR unchanged THRR unchanged     Progression   Progression -- Continue to progress workloads to maintain intensity without signs/symptoms of physical distress. Continue to progress workloads to  maintain intensity without signs/symptoms of physical distress. Continue to progress workloads to maintain intensity without signs/symptoms of physical distress. Continue to progress workloads to maintain intensity without signs/symptoms of physical distress.   Average METs 3.42 3.17 3.31 2.8 2.62     Resistance Training   Training Prescription -- Yes Yes Yes Yes   Weight -- 5 lb 5 lb 5 lb 5 lb   Reps -- 10-15 10-15 10-15 10-15     Interval Training   Interval Training -- No No No No     Treadmill   MPH -- 3.3 3 -- 2.3   Grade -- 0 6 -- 2.5   Minutes -- 15 15 -- 15   METs -- 3.53 5.78 -- 3.55     Recumbant Bike   Level -- -- 2 5 --   Watts -- -- 25 33 --   Minutes -- -- 15 15 --   METs -- -- 2.91 3.24 --     NuStep   Level -- 3 3 4  --  Minutes -- 15 115 30 --   METs -- 2.8 3.3 2.9 --     T5 Nustep   Level -- -- 4 -- 4   Minutes -- -- 15 -- 15   METs -- -- 2 -- 1.4     Track   Laps -- -- -- 23 --   Minutes -- -- -- 15 --   METs -- -- -- 2.25 --     Oxygen   Maintain Oxygen Saturation -- 88% or higher 88% or higher 88% or higher 88% or higher    Row Name 06/24/24 1500             Response to Exercise   Blood Pressure (Admit) 130/84       Blood Pressure (Exit) 110/78       Heart Rate (Admit) 102 bpm       Heart Rate (Exercise) 128 bpm       Heart Rate (Exit) 87 bpm       Rating of Perceived Exertion (Exercise) 15       Symptoms none       Duration Continue with 30 min of aerobic exercise without signs/symptoms of physical distress.       Intensity THRR unchanged         Progression   Progression Continue to progress workloads to maintain intensity without signs/symptoms of physical distress.       Average METs 5.26         Resistance Training   Training Prescription Yes       Weight 5 lb       Reps 10-15         Interval Training   Interval Training No         Treadmill   MPH 2.6       Grade 7.5       Minutes 30       METs 5.68         Oxygen    Maintain Oxygen Saturation 88% or higher          Exercise Comments:   Exercise Comments     Row Name 04/09/24 1539           Exercise Comments First full day of exercise!  Patient was oriented to gym and equipment including functions, settings, policies, and procedures.  Patient's individual exercise prescription and treatment plan were reviewed.  All starting workloads were established based on the results of the 6 minute walk test done at initial orientation visit.  The plan for exercise progression was also introduced and progression will be customized based on patient's performance and goals.          Exercise Goals and Review:   Exercise Goals     Row Name 04/03/24 1048             Exercise Goals   Increase Physical Activity Yes       Intervention Provide advice, education, support and counseling about physical activity/exercise needs.;Develop an individualized exercise prescription for aerobic and resistive training based on initial evaluation findings, risk stratification, comorbidities and participant's personal goals.       Expected Outcomes Short Term: Attend rehab on a regular basis to increase amount of physical activity.;Long Term: Add in home exercise to make exercise part of routine and to increase amount of physical activity.;Long Term: Exercising regularly at least 3-5 days a week.       Increase Strength and Stamina Yes  Intervention Provide advice, education, support and counseling about physical activity/exercise needs.;Develop an individualized exercise prescription for aerobic and resistive training based on initial evaluation findings, risk stratification, comorbidities and participant's personal goals.       Expected Outcomes Short Term: Increase workloads from initial exercise prescription for resistance, speed, and METs.;Short Term: Perform resistance training exercises routinely during rehab and add in resistance training at home;Long Term: Improve  cardiorespiratory fitness, muscular endurance and strength as measured by increased METs and functional capacity ( )       Able to understand and use rate of perceived exertion (RPE) scale Yes       Intervention Provide education and explanation on how to use RPE scale       Expected Outcomes Short Term: Able to use RPE daily in rehab to express subjective intensity level;Long Term:  Able to use RPE to guide intensity level when exercising independently       Able to understand and use Dyspnea scale Yes       Intervention Provide education and explanation on how to use Dyspnea scale       Expected Outcomes Short Term: Able to use Dyspnea scale daily in rehab to express subjective sense of shortness of breath during exertion;Long Term: Able to use Dyspnea scale to guide intensity level when exercising independently       Knowledge and understanding of Target Heart Rate Range (THRR) Yes       Intervention Provide education and explanation of THRR including how the numbers were predicted and where they are located for reference       Expected Outcomes Short Term: Able to state/look up THRR;Short Term: Able to use daily as guideline for intensity in rehab;Long Term: Able to use THRR to govern intensity when exercising independently       Able to check pulse independently Yes       Intervention Provide education and demonstration on how to check pulse in carotid and radial arteries.;Review the importance of being able to check your own pulse for safety during independent exercise       Expected Outcomes Short Term: Able to explain why pulse checking is important during independent exercise;Long Term: Able to check pulse independently and accurately       Understanding of Exercise Prescription Yes       Intervention Provide education, explanation, and written materials on patient's individual exercise prescription       Expected Outcomes Short Term: Able to explain program exercise prescription;Long  Term: Able to explain home exercise prescription to exercise independently          Exercise Goals Re-Evaluation :  Exercise Goals Re-Evaluation     Row Name 04/09/24 1539 04/15/24 1448 04/29/24 1400 05/14/24 0952 05/19/24 1645     Exercise Goal Re-Evaluation   Exercise Goals Review Increase Physical Activity;Able to understand and use rate of perceived exertion (RPE) scale;Knowledge and understanding of Target Heart Rate Range (THRR);Understanding of Exercise Prescription;Increase Strength and Stamina;Able to understand and use Dyspnea scale;Able to check pulse independently Increase Physical Activity;Understanding of Exercise Prescription;Increase Strength and Stamina Increase Physical Activity;Understanding of Exercise Prescription;Increase Strength and Stamina Increase Physical Activity;Understanding of Exercise Prescription;Increase Strength and Stamina Increase Physical Activity;Able to understand and use rate of perceived exertion (RPE) scale;Knowledge and understanding of Target Heart Rate Range (THRR);Understanding of Exercise Prescription;Increase Strength and Stamina;Able to understand and use Dyspnea scale;Able to check pulse independently   Comments Reviewed RPE and dyspnea scale, THR and program prescription with pt  today.  Pt voiced understanding and was given a copy of goals to take home. Heather [name] is off to a good start in the program and completed her first day in this review. She worked at level 3 on the T4 nustep. She was able to do the treadmill and worked at a speed of 3.3 mph with no incline. We will continue to monitor her progress in the program. Heather [name] is doing well in the program. She recently increased her treadmill workload to a speed of 3 mph with a 6% incline. She also improved to level 4 on the T5 nustep. We will continue to monitor her progress in the program. Heather [name] has only attended rehab twice since the last review. She was able to improve to level 5 on the recumbent bike and  level 4 on the T4 nustep. She also did well walking 23 laps on the track. We will continue to monitor her progress in the program Reviewed home exercise with pt today.  Pt plans to do dumbbells for resistance, walking loop inside, might get a walking pad with a handle. and balance exercises for exercise. She plans to add 1 additional day of exercise at home.  Reviewed THR, pulse, RPE, sign and symptoms, pulse oximetery and when to call 911 or MD.  Also discussed weather considerations and indoor options.  Pt voiced understanding.   Expected Outcomes Short: Use RPE daily to regulate intensity. Long: Follow program prescription in THR. Short: Continue to follow current exercise prescription. Long: Continue exercise to improve strength and stamina. Short: Continue to progressively increase workloads. Long: Continue exercise to improve strength and stamina. Short: Continue to progressively increase workloads. Long: Continue exercise to improve strength and stamina. Short: Add 1 additional day of exercise at home. Long: Continue to exercise at home independently.    Row Name 05/29/24 1632 06/12/24 1401 06/24/24 1512         Exercise Goal Re-Evaluation   Exercise Goals Review Increase Physical Activity;Increase Strength and Stamina;Understanding of Exercise Prescription Increase Physical Activity;Increase Strength and Stamina;Understanding of Exercise Prescription --     Comments Heather [name] continues to do well in rehab. She was able to maintain her relative workload on the treadmill at a speed of 2. and an incline of 2.5%. She was also able to maintain level 4 on the T5 nustep. We will continue to monitor her progress in the program. Heather [name] did not attend rehab during the last review period. She did return to rehab on 06/09/2024. We will continue to monitor her progress in the program. Heather [name] has only attended two sessions of rehab since the last review. She only worked on the treadmill during those two sessions,  but she did increase her workload to a speed of 2.6 mph with an incline of 7.5%. We will continue to monitor her progress in the program.     Expected Outcomes Short: Continue to increase treadmill workload. Long: Continue exercise to improve strength and stamina. Short: Return to consistent attendance in rehab. Long: Graduate. Short: Attend rehab more consistently. Long: Continue exercise to improve strength and stamina.        Discharge Exercise Prescription (Final Exercise Prescription Changes):  Exercise Prescription Changes - 06/24/24 1500       Response to Exercise   Blood Pressure (Admit) 130/84    Blood Pressure (Exit) 110/78    Heart Rate (Admit) 102 bpm    Heart Rate (Exercise) 128 bpm    Heart Rate (Exit) 87 bpm    Rating  of Perceived Exertion (Exercise) 15    Symptoms none    Duration Continue with 30 min of aerobic exercise without signs/symptoms of physical distress.    Intensity THRR unchanged      Progression   Progression Continue to progress workloads to maintain intensity without signs/symptoms of physical distress.    Average METs 5.26      Resistance Training   Training Prescription Yes    Weight 5 lb    Reps 10-15      Interval Training   Interval Training No      Treadmill   MPH 2.6    Grade 7.5    Minutes 30    METs 5.68      Oxygen   Maintain Oxygen Saturation 88% or higher          Nutrition:  Target Goals: Understanding of nutrition guidelines, daily intake of sodium 1500mg , cholesterol 200mg , calories 30% from fat and 7% or less from saturated fats, daily to have 5 or more servings of fruits and vegetables.  Education: Nutrition 1 -Group instruction provided by verbal, written material, interactive activities, discussions, models, and posters to present general guidelines for heart healthy nutrition including macronutrients, label reading, and promoting whole foods over processed counterparts. Education serves as Pensions consultant of discussion  of heart healthy eating for all. Written material provided at class time.    Education: Nutrition 2 -Group instruction provided by verbal, written material, interactive activities, discussions, models, and posters to present general guidelines for heart healthy nutrition including sodium, cholesterol, and saturated fat. Providing guidance of habit forming to improve blood pressure, cholesterol, and body weight. Written material provided at class time.     Biometrics:  Pre Biometrics - 04/03/24 1048       Pre Biometrics   Height 5' 7.4 (1.712 m)    Weight 191 lb 14.4 oz (87 kg)    Waist Circumference 41.9 inches    Hip Circumference 47 inches    Waist to Hip Ratio 0.89 %    BMI (Calculated) 29.7    Single Leg Stand 23 seconds           Nutrition Therapy Plan and Nutrition Goals:  Nutrition Therapy & Goals - 04/03/24 1049       Nutrition Therapy   RD appointment deferred Yes      Personal Nutrition Goals   Nutrition Goal RD appointment deferred at this time, will think about maybe scheduling in the future      Intervention Plan   Intervention Prescribe, educate and counsel regarding individualized specific dietary modifications aiming towards targeted core components such as weight, hypertension, lipid management, diabetes, heart failure and other comorbidities.    Expected Outcomes Short Term Goal: Understand basic principles of dietary content, such as calories, fat, sodium, cholesterol and nutrients.          Nutrition Assessments:  MEDIFICTS Score Key: >=70 Need to make dietary changes  40-70 Heart Healthy Diet <= 40 Therapeutic Level Cholesterol Diet  Flowsheet Row Cardiac Rehab from 04/02/2024 in [facility] Cardiac and Pulmonary Rehab  Picture Your Plate Total Score on Admission 59   Picture Your Plate Scores: <59 Unhealthy dietary pattern with much room for improvement. 41-50 Dietary pattern unlikely to meet recommendations for good health and room for  improvement. 51-60 More healthful dietary pattern, with some room for improvement.  >60 Healthy dietary pattern, although there may be some specific behaviors that could be improved.    Nutrition Goals Re-Evaluation:  Nutrition Goals  Re-Evaluation     Row Name 05/19/24 1701             Goals   Nutrition Goal RD appointment deferred at this time          Nutrition Goals Discharge (Final Nutrition Goals Re-Evaluation):  Nutrition Goals Re-Evaluation - 05/19/24 1701       Goals   Nutrition Goal RD appointment deferred at this time          Psychosocial: Target Goals: Acknowledge presence or absence of significant depression and/or stress, maximize coping skills, provide positive support system. Participant is able to verbalize types and ability to use techniques and skills needed for reducing stress and depression.   Education: Stress, Anxiety, and Depression - Group verbal and visual presentation to define topics covered.  Reviews how body is impacted by stress, anxiety, and depression.  Also discusses healthy ways to reduce stress and to treat/manage anxiety and depression. Written material provided at class time. Flowsheet Row Cardiac Rehab from 06/18/2024 in [facility] Cardiac and Pulmonary Rehab  Date 04/09/24  Educator mc  Instruction Review Code 1- Verbalizes Understanding    Education: Sleep Hygiene -Provides group verbal and written instruction about how sleep can affect your health.  Define sleep hygiene, discuss sleep cycles and impact of sleep habits. Review good sleep hygiene tips.   Initial Review & Psychosocial Screening:  Initial Psych Review & Screening - 04/02/24 1117       Initial Review   Current issues with Current Stress Concerns;Current Psychotropic Meds    Source of Stress Concerns Chronic Illness;Unable to participate in former interests or hobbies;Unable to perform yard/household activities      Barriers   Psychosocial barriers to participate in  program There are no identifiable barriers or psychosocial needs.      Screening Interventions   Interventions Encouraged to exercise;To provide support and resources with identified psychosocial needs;Provide feedback about the scores to participant    Expected Outcomes Short Term goal: Utilizing psychosocial counselor, staff and physician to assist with identification of specific Stressors or current issues interfering with healing process. Setting desired goal for each stressor or current issue identified.;Long Term Goal: Stressors or current issues are controlled or eliminated.;Short Term goal: Identification and review with participant of any Quality of Life or Depression concerns found by scoring the questionnaire.;Long Term goal: The participant improves quality of Life and PHQ9 Scores as seen by post scores and/or verbalization of changes          Quality of Life Scores:   Scores of 19 and below usually indicate a poorer quality of life in these areas.  A difference of  2-3 points is a clinically meaningful difference.  A difference of 2-3 points in the total score of the Quality of Life Index has been associated with significant improvement in overall quality of life, self-image, physical symptoms, and general health in studies assessing change in quality of life.  PHQ-9: Review Flowsheet  More data exists      05/30/2024 05/19/2024 04/03/2024 04/27/2023 10/26/2022  Depression screen PHQ 2/9  Decreased Interest 3 3 3 1 3   Down, Depressed, Hopeless 1 1 1 1  0  PHQ - 2 Score 4 4 4 2 3   Altered sleeping 3 3 3  - 0  Tired, decreased energy 3 3 3  0 3  Change in appetite 1 0 0 0 0  Feeling bad or failure about yourself  1 1 1 1 1   Trouble concentrating 3 2 3 1  2  Moving slowly or fidgety/restless 1 2 1  0 0  Suicidal thoughts 0 0 0 0 0  PHQ-9 Score 16 15 15  - 9  Difficult doing work/chores Extremely dIfficult Extremely dIfficult Extremely dIfficult Extremely dIfficult Extremely dIfficult    Interpretation of Total Score  Total Score Depression Severity:  1-4 = Minimal depression, 5-9 = Mild depression, 10-14 = Moderate depression, 15-19 = Moderately severe depression, 20-27 = Severe depression   Psychosocial Evaluation and Intervention:  Psychosocial Evaluation - 04/02/24 1118       Psychosocial Evaluation & Interventions   Interventions Encouraged to exercise with the program and follow exercise prescription;Stress management education;Relaxation education    Comments Heather [name] is coming to cardiac rehab 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 She is meeting with therapists to help with her long covid symptoms. She is on mood stabilizer medication as well to help with symptoms. She is wary of attending the program because she doesn't know if she will be able to finish it, but she wants to learn as much as possible. She is on long term disability from Covid because if she is very active, it takes her 2-3 days to recover. She is learning to take breaks and pace herself. She is being followed with the long covid clinic at [facility] which has been beneficial for her.    Expected Outcomes Short: attend cardiac rehab for education and exercise Long: develop and maintain positive self care habits    Continue Psychosocial Services  Follow up required by staff          Psychosocial Re-Evaluation:  Psychosocial Re-Evaluation     Row Name 05/19/24 1652             Psychosocial Re-Evaluation   Current issues with Current Stress Concerns       Comments Heather [name] has current stress regarding her chronic illness. She is dealing with chronic fatigue and gets wiped out for multiple days after coming to rehab. She is learning to listen to her body and determine what her limits are to reduce the fatigue. We discussed home exercise and intermittent exercise sessions to do at home during her  good days with rest in between or when she can not attend rehab, but wants to do some movement at home. She hopes this will help her stamina levels. She has a strong support system, and her occupational therapist has been a huge help in this current stressor and she meets with her on Tuesdays. She followed up on her PHQ screening and it stayed the same at 15 today.       Expected Outcomes Short: Add in some exercise at home and ways to reduce her fatigue after rehab. Long: Attend Cardiac Rehab for exercise benefits and education on stress management       Interventions Encouraged to attend Cardiac Rehabilitation for the exercise          Psychosocial Discharge (Final Psychosocial Re-Evaluation):  Psychosocial Re-Evaluation - 05/19/24 1652       Psychosocial Re-Evaluation   Current issues with Current Stress Concerns    Comments Heather [name] has current stress regarding her chronic illness. She is dealing with chronic fatigue and gets wiped out for multiple days after coming to rehab. She is learning to listen to her body and determine what her limits are to reduce the fatigue. We discussed home exercise and intermittent exercise sessions to  do at home during her good days with rest in between or when she can not attend rehab, but wants to do some movement at home. She hopes this will help her stamina levels. She has a strong support system, and her occupational therapist has been a huge help in this current stressor and she meets with her on Tuesdays. She followed up on her PHQ screening and it stayed the same at 15 today.    Expected Outcomes Short: Add in some exercise at home and ways to reduce her fatigue after rehab. Long: Attend Cardiac Rehab for exercise benefits and education on stress management    Interventions Encouraged to attend Cardiac Rehabilitation for the exercise          Vocational Rehabilitation: Provide vocational rehab assistance to qualifying candidates.   Vocational Rehab  Evaluation & Intervention:  Vocational Rehab - 04/02/24 1117       Initial Vocational Rehab Evaluation & Intervention   Assessment shows need for Vocational Rehabilitation No          Education: Education Goals: Education classes will be provided on a variety of topics geared toward better understanding of heart health and risk factor modification. Participant will state understanding/return demonstration of topics presented as noted by education test scores.  Learning Barriers/Preferences:  Learning Barriers/Preferences - 04/02/24 1116       Learning Barriers/Preferences   Learning Barriers Exercise Concerns   Long Covid- brain fog   Learning Preferences Individual Instruction          General Cardiac Education Topics:  AED/CPR: - Group verbal and written instruction with the use of models to demonstrate the basic use of the AED with the basic ABC's of resuscitation.   Test and Procedures: - Group verbal and visual presentation and models provide information about basic cardiac anatomy and function. Reviews the testing methods done to diagnose heart disease and the outcomes of the test results. Describes the treatment choices: Medical Management, Angioplasty, or Coronary Bypass Surgery for treating various heart conditions including Myocardial Infarction, Angina, Valve Disease, and Cardiac Arrhythmias. Written material provided at class time.   Medication Safety: - Group verbal and visual instruction to review commonly prescribed medications for heart and lung disease. Reviews the medication, class of the drug, and side effects. Includes the steps to properly store meds and maintain the prescription regimen. Written material provided at class time.   Intimacy: - Group verbal instruction through game format to discuss how heart and lung disease can affect sexual intimacy. Written material provided at class time.   Know Your Numbers and Heart Failure: - Group verbal and  visual instruction to discuss disease risk factors for cardiac and pulmonary disease and treatment options.  Reviews associated critical values for Overweight/Obesity, Hypertension, Cholesterol, and Diabetes.  Discusses basics of heart failure: signs/symptoms and treatments.  Introduces Heart Failure Zone chart for action plan for heart failure. Written material provided at class time.   Infection Prevention: - Provides verbal and written material to individual with discussion of infection control including proper hand washing and proper equipment cleaning during exercise session. Flowsheet Row Cardiac Rehab from 06/18/2024 in [facility] Cardiac and Pulmonary Rehab  Date 04/03/24  Educator MB  Instruction Review Code 1- Verbalizes Understanding    Falls Prevention: - Provides verbal and written material to individual with discussion of falls prevention and safety. Flowsheet Row Cardiac Rehab from 06/18/2024 in [facility] Cardiac and Pulmonary Rehab  Date 04/03/24  Educator MB  Instruction Review Code 1- [employer]  Understanding    Other: -Provides group and verbal instruction on various topics (see comments)   Knowledge Questionnaire Score:   Core Components/Risk Factors/Patient Goals at Admission:  Personal Goals and Risk Factors at Admission - 04/03/24 1049       Core Components/Risk Factors/Patient Goals on Admission    Weight Management Yes;Weight Loss    Intervention Weight Management: Develop a combined nutrition and exercise program designed to reach desired caloric intake, while maintaining appropriate intake of nutrient and fiber, sodium and fats, and appropriate energy expenditure required for the weight goal.;Weight Management: Provide education and appropriate resources to help participant work on and attain dietary goals.;Weight Management/Obesity: Establish reasonable short term and long term weight goals.    Admit Weight 191 lb 14.4 oz (87 kg)    Goal Weight: Short Term 169 lb 8 oz  (76.9 kg)    Goal Weight: Long Term 148 lb (67.1 kg)    Expected Outcomes Short Term: Continue to assess and modify interventions until short term weight is achieved;Long Term: Adherence to nutrition and physical activity/exercise program aimed toward attainment of established weight goal;Weight Loss: Understanding of general recommendations for a balanced deficit meal plan, which promotes 1-2 lb weight loss per week and includes a negative energy balance of [phone] kcal/d;Understanding recommendations for meals to include 15-35% energy as protein, 25-35% energy from fat, 35-60% energy from carbohydrates, less than 200mg  of dietary cholesterol, 20-35 gm of total fiber daily;Understanding of distribution of calorie intake throughout the day with the consumption of 4-5 meals/snacks    Hypertension Yes    Intervention Provide education on lifestyle modifcations including regular physical activity/exercise, weight management, moderate sodium restriction and increased consumption of fresh fruit, vegetables, and low fat dairy, alcohol moderation, and smoking cessation.;Monitor prescription use compliance.    Expected Outcomes Short Term: Continued assessment and intervention until BP is < 140/43mm HG in hypertensive participants. < 130/98mm HG in hypertensive participants with diabetes, heart failure or chronic kidney disease.;Long Term: Maintenance of blood pressure at goal levels.    Lipids Yes    Intervention Provide education and support for participant on nutrition & aerobic/resistive exercise along with prescribed medications to achieve LDL 70mg , HDL >40mg .    Expected Outcomes Short Term: Participant states understanding of desired cholesterol values and is compliant with medications prescribed. Participant is following exercise prescription and nutrition guidelines.;Long Term: Cholesterol controlled with medications as prescribed, with individualized exercise RX and with personalized nutrition plan.  Value goals: LDL < 70mg , HDL > 40 mg.          Education:Diabetes - Individual verbal and written instruction to review signs/symptoms of diabetes, desired ranges of glucose level fasting, after meals and with exercise. Acknowledge that pre and post exercise glucose checks will be done for 3 sessions at entry of program.   Core Components/Risk Factors/Patient Goals Review:   Goals and Risk Factor Review     Row Name 05/19/24 1701             Core Components/Risk Factors/Patient Goals Review   Personal Goals Review Weight Management/Obesity       Review Heather [name] is doing very well with her weight loss goal. She weighed 180.5 lbs today and is currently down 11 lbs from her admission weight of 191lb. She states she is eating better and trying to be consistent with her exercise when she can.       Expected Outcomes Short: Continue logging weight. Long: Continue to attend rehab, do home exericse, and work  on healthy eating habits.          Core Components/Risk Factors/Patient Goals at Discharge (Final Review):   Goals and Risk Factor Review - 05/19/24 1701       Core Components/Risk Factors/Patient Goals Review   Personal Goals Review Weight Management/Obesity    Review Heather [name] is doing very well with her weight loss goal. She weighed 180.5 lbs today and is currently down 11 lbs from her admission weight of 191lb. She states she is eating better and trying to be consistent with her exercise when she can.    Expected Outcomes Short: Continue logging weight. Long: Continue to attend rehab, do home exericse, and work on healthy eating habits.          ITP Comments:  ITP Comments     Row Name 04/02/24 1114 04/03/24 1043 04/09/24 1539 04/23/24 0943 05/21/24 0942   ITP Comments Initial phone call completed. Diagnosis can be found in [facility] 6/19. EP Orientation scheduled for Thursday 7/10 at 8:30. Completed and gym orientation for cardiac rehab. Initial ITP created and sent for review to  Dr. Oneil Pinal, Medical Director. First full day of exercise!  Patient was oriented to gym and equipment including functions, settings, policies, and procedures.  Patient's individual exercise prescription and treatment plan were reviewed.  All starting workloads were established based on the results of the 6 minute walk test done at initial orientation visit.  The plan for exercise progression was also introduced and progression will be customized based on patient's performance and goals. 30 Day review completed. Medical Director ITP review done, changes made as directed, and signed approval by Medical Director. New to program. 30 Day review completed. Medical Director ITP review done; changes made as directed and signed approval by Medical Director.    Row Name 05/21/24 1438 06/18/24 1407 07/02/24 1519       ITP Comments Heather [name] called to state that she would not be coming to class today. She was feeling tired and stated she was having chest discomfort. She was advised to take her nitroglycerin  to see if it relieved her symptoms, and to call her doctor. She was also advised that if her symptoms were not relieved with nitroglycerin  to call 911. 30 Day review completed. Medical Director ITP review done; changes made as directed and signed approval by Medical Director. Early discharge due to chronic fatigue.        Comments: early discharge

## 2024-07-03 NOTE — Telephone Encounter (Signed)
 Okay to send in Duoneb? It is not mentioned in your last note. Dr. Glendia send in the last prescription.

## 2024-07-04 MED ORDER — IPRATROPIUM-ALBUTEROL 0.5-2.5 (3) MG/3ML IN SOLN: 3.0000 mL | RESPIRATORY_TRACT | 11 refills | Status: AC | PRN

## 2024-07-07 ENCOUNTER — Encounter

## 2024-07-09 ENCOUNTER — Encounter

## 2024-07-14 ENCOUNTER — Telehealth: Payer: Self-pay

## 2024-07-14 ENCOUNTER — Encounter

## 2024-07-14 NOTE — Telephone Encounter (Signed)
 lvm

## 2024-07-14 NOTE — Telephone Encounter (Signed)
 Copied from CRM #[id number]. Topic: Referral - Status >> Jul 11, 2024  5:02 PM Alfonso HERO wrote: Reason for CRM: Patient called in regards to her Vascular referral. Called the give the correct contact info for Dr Lauraine Kipper over at [facility]. Phone [phone] and the Fax is [phone]. Please refax referral because they didn't get.

## 2024-07-14 NOTE — Telephone Encounter (Signed)
Pt notified referral was faxed

## 2024-07-15 ENCOUNTER — Ambulatory Visit (INDEPENDENT_AMBULATORY_CARE_PROVIDER_SITE_OTHER)

## 2024-07-15 DIAGNOSIS — E538 Deficiency of other specified B group vitamins: Secondary | ICD-10-CM | POA: Diagnosis not present

## 2024-07-15 MED ORDER — CYANOCOBALAMIN 1000 MCG/ML IJ SOLN
1000.0000 ug | Freq: Once | INTRAMUSCULAR | Status: AC
  Administered 2024-07-15: 1000 ug via INTRAMUSCULAR

## 2024-07-15 NOTE — Progress Notes (Signed)
 Pt received B12 injection in Left  deltoid muscle. Pt tolerated it well with no complaints or concerns.

## 2024-07-16 ENCOUNTER — Encounter

## 2024-07-21 ENCOUNTER — Encounter

## 2024-07-23 ENCOUNTER — Encounter

## 2024-07-24 ENCOUNTER — Encounter: Payer: Self-pay | Admitting: Internal Medicine

## 2024-07-24 ENCOUNTER — Ambulatory Visit (INDEPENDENT_AMBULATORY_CARE_PROVIDER_SITE_OTHER): Admitting: Internal Medicine

## 2024-07-24 ENCOUNTER — Telehealth: Payer: Self-pay

## 2024-07-24 VITALS — BP 120/80 | HR 98 | Temp 97.7°F | Ht 67.0 in | Wt 165.0 lb

## 2024-07-24 DIAGNOSIS — K2210 Ulcer of esophagus without bleeding: Secondary | ICD-10-CM | POA: Diagnosis not present

## 2024-07-24 DIAGNOSIS — E7800 Pure hypercholesterolemia, unspecified: Secondary | ICD-10-CM

## 2024-07-24 DIAGNOSIS — Z1231 Encounter for screening mammogram for malignant neoplasm of breast: Secondary | ICD-10-CM

## 2024-07-24 DIAGNOSIS — G40909 Epilepsy, unspecified, not intractable, without status epilepticus: Secondary | ICD-10-CM

## 2024-07-24 DIAGNOSIS — K219 Gastro-esophageal reflux disease without esophagitis: Secondary | ICD-10-CM

## 2024-07-24 DIAGNOSIS — J4550 Severe persistent asthma, uncomplicated: Secondary | ICD-10-CM

## 2024-07-24 DIAGNOSIS — D72829 Elevated white blood cell count, unspecified: Secondary | ICD-10-CM | POA: Diagnosis not present

## 2024-07-24 DIAGNOSIS — D472 Monoclonal gammopathy: Secondary | ICD-10-CM

## 2024-07-24 DIAGNOSIS — R7989 Other specified abnormal findings of blood chemistry: Secondary | ICD-10-CM | POA: Diagnosis not present

## 2024-07-24 DIAGNOSIS — I25119 Atherosclerotic heart disease of native coronary artery with unspecified angina pectoris: Secondary | ICD-10-CM

## 2024-07-24 DIAGNOSIS — E559 Vitamin D deficiency, unspecified: Secondary | ICD-10-CM

## 2024-07-24 MED ORDER — PREDNISONE 10 MG PO TABS: ORAL_TABLET | ORAL | 0 refills | Status: DC

## 2024-07-24 MED ORDER — AZITHROMYCIN 250 MG PO TABS: ORAL_TABLET | ORAL | 0 refills | Status: AC

## 2024-07-24 MED ORDER — OMEPRAZOLE 40 MG PO CPDR: 40.0000 mg | DELAYED_RELEASE_CAPSULE | Freq: Every day | ORAL | 1 refills | Status: DC

## 2024-07-24 NOTE — Telephone Encounter (Signed)
 Copied from CRM #[id number]. Topic: Clinical - Prescription Issue >> Jul 24, 2024 12:55 PM Sasha M wrote: Reason for CRM: Tully from [facility] pharmacy called to verify prescription for z-pak stating that pt is allergic to certain ingredients in this medication and wanted to clarify if they should proceed with filling the script or not. Call back number is [phone]

## 2024-07-24 NOTE — Progress Notes (Signed)
 Subjective:    Patient ID: Heather [name], female    DOB: 1976/03/04, 48 y.o.   MRN: [id number]  Patient here for  Chief Complaint  Patient presents with   Medical Management of Chronic Issues   Headache   Facial Pain    HPI Here for a scheduled follow up - follow up regarding asthma and long covid. Just evaluated by ST 07/10/24. Had f/u with pulmonary 07/01/24 - f/u asthma. On dupixent . Helping. PFTs - overall c/w ashtma. Changed advair to advair HFA with a chamber. Continues cpap. Airsupra  as needed. Continue singulair  and flonase . Had f/u with cardiology 06/20/24 - history of non-STEMI 01/2024 s/p PCI and stenting to cfx on prasugrel. No changes made. Continue crestor, metoprolol and imdur. She is walking some. Discussed moving and trying to stay active. Using neb now 2-3x/day. Prior to flare - using 1x/day. Congestion and breathing - worsened over the last few days. Some increased nasal congestion and some yellow mucus production. Some productive cough. Some sinus pressure. No vomiting. Bowels stable.    Past Medical History:  Diagnosis Date   Anxiety    Arthritis    Asthma    CHF (congestive heart failure) (HCC)    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 hauler    Depression    bipolar   Dysrhythmia    Fainting spell    H/O   GERD (gastroesophageal reflux disease)    H/O cardiac arrhythmia    Migraines    Seizures (HCC)    Sleep apnea    Past Surgical History:  Procedure Laterality Date   COLONOSCOPY WITH PROPOFOL  N/A 06/03/2021   Procedure: COLONOSCOPY WITH PROPOFOL ;  Surgeon: Janalyn Keene NOVAK, MD;  Location: [facility] ENDOSCOPY;  Service: Endoscopy;  Laterality: N/A;   ESOPHAGOGASTRODUODENOSCOPY N/A 06/03/2021   Procedure: ESOPHAGOGASTRODUODENOSCOPY (EGD);  Surgeon: Janalyn Keene NOVAK, MD;  Location: [facility] ENDOSCOPY;  Service: Endoscopy;  Laterality: N/A;   LEFT HEART CATH AND CORONARY ANGIOGRAPHY N/A 02/21/2024   Procedure: LEFT HEART CATH AND CORONARY ANGIOGRAPHY;  Surgeon: Darron Deatrice LABOR, MD;   Location: [facility] INVASIVE CV LAB;  Service: Cardiovascular;  Laterality: N/A;   TUBAL LIGATION Bilateral 09/25/2002   tubal reversal     Family History  Problem Relation Age of Onset   Hypertension Mother    Diabetes Mother    Arthritis Mother    Heart disease Mother    ADD / ADHD Daughter    ADD / ADHD Daughter    ADD / ADHD Son    Breast cancer Neg Hx    Social History   Socioeconomic History   Marital status: Married    Spouse name: Not on file   Number of children: Not on file   Years of education: Not on file   Highest education [name]: Not on file  Occupational History   Not on file  Tobacco Use   Smoking status: Former    Current packs/day: 0.00    Average packs/day: 0.5 packs/day for 5.0 years (2.5 ttl pk-yrs)    Types: Cigarettes    Start date: 2008    Quit date: 2013    Years since quitting: 12.8   Smokeless tobacco: Never   Tobacco comments:    Quit mid October 2014  Vaping Use   Vaping status: Never Used  Substance and Sexual Activity   Alcohol use: No    Alcohol/week: 0.0 standard drinks of alcohol   Drug use: No   Sexual activity: Yes    Birth control/protection: None  Other Topics  Concern   Not on file  Social History Narrative   Not on file   Social Drivers of Health   Financial Resource Strain: Medium Risk (02/23/2024)   Received from [facility]   Overall Financial Resource Strain (CARDIA)    Difficulty of Paying Living Expenses: Somewhat hard  Food Insecurity: Food Insecurity Present (02/23/2024)   Received from [facility]   Hunger Vital Sign    Within the past 12 months, you worried that your food would run out before you got the money to buy more.: Sometimes true    Within the past 12 months, the food you bought just didn't last and you didn't have money to get more.: Sometimes true  Transportation Needs: No Transportation Needs (02/23/2024)   Received from [facility] -  Transportation    In the past 12 months, has lack of transportation kept you from medical appointments or from getting medications?: No    Lack of Transportation (Non-Medical): No  Physical Activity: Unknown (10/30/2023)   Exercise Vital Sign    Days of Exercise per Week: Patient declined    Minutes of Exercise per Session: Not on file  Stress: Stress Concern Present (10/30/2023)   [employer] - Occupational Stress Questionnaire    Feeling of Stress : To some extent  Social Connections: Unknown (10/30/2023)   Social Connection and Isolation Panel    Frequency of Communication with Friends and Family: Patient declined    Frequency of Social Gatherings with Friends and Family: Never    Attends Religious Services: Never    Database Administrator or Organizations: No    Attends Engineer, Structural: Not on file    Marital Status: Patient declined     Review of Systems  Constitutional:  Negative for appetite change and unexpected weight change.  HENT:  Positive for congestion, postnasal drip and sinus pressure.   Respiratory:  Positive for cough. Negative for chest tightness and shortness of breath.        Productive yellow mucus.   Cardiovascular:  Negative for chest pain and palpitations.  Gastrointestinal:  Negative for abdominal pain, nausea and vomiting.  Genitourinary:  Negative for difficulty urinating and dysuria.  Musculoskeletal:  Negative for joint swelling and myalgias.  Skin:  Negative for color change and rash.  Neurological:  Negative for dizziness and headaches.  Psychiatric/Behavioral:  Negative for agitation and dysphoric mood.        Objective:     BP 120/80   Pulse 98   Temp 97.7 F (36.5 C) (Oral)   Ht 5' 7 (1.702 m)   Wt 165 lb (74.8 kg)   SpO2 96%   BMI 25.84 kg/m  Wt Readings from Last 3 Encounters:  07/24/24 165 lb (74.8 kg)  07/01/24 174 lb 12.8 oz (79.3 kg)  07/01/24 174 lb 12.8 oz (79.3 kg)    Physical  Exam Vitals reviewed.  Constitutional:      General: She is not in acute distress.    Appearance: Normal appearance.  HENT:     Head: Normocephalic and atraumatic.     Right Ear: External ear normal.     Left Ear: External ear normal.  Eyes:     General: No scleral icterus.       Right eye: No discharge.        Left eye: No discharge.     Conjunctiva/sclera: Conjunctivae normal.  Neck:  Thyroid : No thyromegaly.  Cardiovascular:     Rate and Rhythm: Normal rate and regular rhythm.  Pulmonary:     Effort: No respiratory distress.     Breath sounds: Normal breath sounds. No wheezing.     Comments: Increased air movement. Some cough with forced expiration.  Abdominal:     General: Bowel sounds are normal.     Palpations: Abdomen is soft.     Tenderness: There is no abdominal tenderness.  Musculoskeletal:        General: No swelling or tenderness.     Cervical back: Neck supple. No tenderness.  Lymphadenopathy:     Cervical: No cervical adenopathy.  Skin:    Findings: No erythema or rash.  Neurological:     Mental Status: [name].  Psychiatric:        Mood and Affect: Mood normal.        Behavior: Behavior normal.         Outpatient Encounter Medications as of 07/24/2024  Medication Sig   amphetamine -dextroamphetamine  (ADDERALL XR) 20 MG 24 hr capsule Take 20 mg by mouth every morning.   [EXPIRED] azithromycin  (ZITHROMAX ) 250 MG tablet Take 2 tablets on day 1, then 1 tablet daily on days 2 through 5   BELSOMRA 20 MG TABS Take 1 tablet by mouth at bedtime.   BREZTRI  AEROSPHERE 160-9-4.8 MCG/ACT AERO inhaler Inhale 2 puffs into the lungs 2 (two) times daily.   predniSONE  (DELTASONE ) 10 MG tablet Take 6 tablets x 1 day and then decrease by 1/2 tablet per day until down to zero mg.   albuterol  (VENTOLIN  HFA) 108 (90 Base) MCG/ACT inhaler INHALE 1 TO 2 PUFFS BY MOUTH EVERY 4 HOURS AS NEEDED FOR WHEEZING FOR SHORTNESS OF BREATH   Albuterol -Budesonide  (AIRSUPRA ) 90-80  MCG/ACT AERO INHALE 2 PUFFS EVERY 6 HOURS   ascorbic acid (VITAMIN C) 1000 MG tablet Take 500 mg by mouth daily.   aspirin  EC 81 MG tablet Take 1 tablet (81 mg total) by mouth daily. Swallow whole.   benzonatate  (TESSALON ) 100 MG capsule Take 1 capsule (100 mg total) by mouth every 8 (eight) hours. (Patient taking differently: Take 100 mg by mouth every 8 (eight) hours as needed for cough.)   cetirizine  (ZYRTEC ) 10 MG tablet Take 1 tablet by mouth once daily   cyanocobalamin  (VITAMIN B12) 1000 MCG/ML injection Inject 1,000 mcg into the muscle every 30 (thirty) days.   diazepam  (VALIUM ) 10 MG tablet Take 1 tablet by mouth 4 (four) times daily as needed.   divalproex  (DEPAKOTE  ER) 500 MG 24 hr tablet Take 1,500 mg by mouth at bedtime.   Dupilumab  (DUPIXENT ) 300 MG/2ML SOAJ Inject 300 mg into the skin every 14 (fourteen) days. **loading dose completed in clinic on 01/04/2024**   ezetimibe (ZETIA) 10 MG tablet Take 1 tablet by mouth daily.   fluticasone  (FLONASE ) 50 MCG/ACT nasal spray Place 1 spray into both nostrils daily.   fluticasone -salmeterol (ADVAIR HFA) 230-21 MCG/ACT inhaler Inhale 2 puffs into the lungs 2 (two) times daily.   ipratropium (ATROVENT ) 0.03 % nasal spray Place 2 sprays into both nostrils every 12 (twelve) hours.   ipratropium-albuterol  (DUONEB) 0.5-2.5 (3) MG/3ML SOLN Inhale 3 mLs into the lungs every 4 (four) hours as needed.   isosorbide mononitrate (IMDUR) 30 MG 24 hr tablet Take 30 mg by mouth daily.   linaclotide  (LINZESS ) 290 MCG CAPS capsule Take 1 capsule (290 mcg total) by mouth daily before breakfast. (Patient taking differently: Take 290 mcg by  mouth daily as needed.)   losartan (COZAAR) 25 MG tablet Take 25 mg by mouth daily.   metoprolol succinate (TOPROL-XL) 25 MG 24 hr tablet Take 1 tablet by mouth daily.   montelukast  (SINGULAIR ) 10 MG tablet Take 1 tablet (10 mg total) by mouth at bedtime.   nitroGLYCERIN  (NITROSTAT ) 0.4 MG SL tablet Place 0.4 mg under the  tongue every 5 (five) minutes as needed.   nitroGLYCERIN  0.2 mg/mL infusion Inject 0-200 mcg/min into the vein continuous.   omeprazole  (PRILOSEC) 40 MG capsule Take 1 capsule (40 mg total) by mouth daily.   ondansetron  (ZOFRAN -ODT) 4 MG disintegrating tablet DISSOLVE 1 TABLET IN MOUTH TWICE DAILY AS NEEDED FOR NAUSEA AND  VOMITING   prasugrel (EFFIENT) 10 MG TABS tablet Take 1 tablet by mouth daily.   rosuvastatin (CRESTOR) 40 MG tablet Take 40 mg by mouth daily.   Spacer/Aero-Holding Chambers (AEROCHAMBER MV) inhaler Use as instructed   topiramate (TOPAMAX) 25 MG tablet Take 50 mg by mouth 2 (two) times daily.   Vitamin D , Ergocalciferol , (DRISDOL ) 1.25 MG (50000 UNIT) CAPS capsule Take 1 capsule (50,000 Units total) by mouth every 7 (seven) days.   [DISCONTINUED] omeprazole  (PRILOSEC) 40 MG capsule TAKE 1 CAPSULE BY MOUTH ONCE DAILY BEFORE BREAKFAST   No facility-administered encounter medications on file as of 07/24/2024.     Lab Results  Component Value Date   WBC 13.0 (H) 06/16/2024   HGB 14.1 06/16/2024   HCT 42.9 06/16/2024   PLT 231.0 06/16/2024   GLUCOSE 95 05/30/2024   CHOL 95 (L) 05/30/2024   TRIG 129 05/30/2024   HDL 41 05/30/2024   LDLDIRECT 136.0 07/10/2022   LDLCALC 31 05/30/2024   ALT 37 (H) 06/16/2024   AST 24 06/16/2024   NA 141 05/30/2024   K 4.2 06/24/2024   CL 104 05/30/2024   CREATININE 0.75 05/30/2024   BUN 11 05/30/2024   CO2 21 05/30/2024   TSH 1.640 05/30/2024   INR 1.0 02/20/2024   HGBA1C 4.7 (L) 02/21/2024    CARDIAC CATHETERIZATION Addendum Date: 02/22/2024   Dist LM to Ost LAD lesion is 30% stenosed.   Ost Cx to Prox Cx lesion is 90% stenosed.   Prox RCA lesion is 20% stenosed.   RPAV lesion is 20% stenosed.   LPAV lesion is 60% stenosed. 1.  Severe one-vessel coronary artery disease affecting the ostial left circumflex.  There is mild distal left main and ostial LAD stenosis. 2.  Left ventricular angiography was not performed.  EF was normal by  echo. 3.  Moderately elevated left ventricular end-diastolic pressure at 22 mmHg. Recommendations: The ostial left circumflex stenosis is not optimal for PCI and likely requires bifurcation stenting in order to preserve the LAD.  Will discuss tomorrow during the heart team meeting. Resume heparin  drip 2 hours after TR band removal.  Result Date: 02/22/2024   Dist LM to Ost LAD lesion is 30% stenosed.   Ost Cx to Prox Cx lesion is 90% stenosed.   Prox RCA lesion is 20% stenosed.   RPAV lesion is 20% stenosed. 1.  Severe one-vessel coronary artery disease affecting the ostial left circumflex.  There is mild distal left main and ostial LAD stenosis. 2.  Left ventricular angiography was not performed.  EF was normal by echo. 3.  Moderately elevated left ventricular end-diastolic pressure at 22 mmHg. Recommendations: The ostial left circumflex stenosis is not optimal for PCI and likely requires bifurcation stenting in order to preserve the LAD.  Will  discuss tomorrow during the heart team meeting. Resume heparin  drip 2 hours after TR band removal.   ECHOCARDIOGRAM COMPLETE Result Date: 02/21/2024    ECHOCARDIOGRAM REPORT   Patient Name:   Heather [name] [facility] Date of Exam: 02/21/2024 Medical Rec #:  [id number]      Height:       66.0 in Accession #:    [id number]     Weight:       184.3 lb Date of Birth:  12/29/1975      BSA:          1.932 m Patient Age:    47 years       BP:           127/86 mmHg Patient Gender: F              HR:           75 bpm. Exam Location:  [facility] Procedure: 2D Echo, Cardiac Doppler, Color Doppler, Strain Analysis and 3D Echo            (Both Spectral and Color Flow Doppler were utilized during            procedure). Indications:     Chest pain R07.9  History:         Patient has no prior history of Echocardiogram examinations.                  History of cardiac arrhythmia and migraine.  Sonographer:     Christopher Furnace Referring Phys:  [id number] XILIN NIU Diagnosing Phys: Redell Cave MD  Sonographer  Comments: Global longitudinal strain was attempted. IMPRESSIONS  1. Left ventricular ejection fraction, by estimation, is 55 to 60%. The left ventricle has normal function. The left ventricle has no regional wall motion abnormalities. Left ventricular diastolic parameters were normal.  2. Right ventricular systolic function is normal. The right ventricular size is normal.  3. The mitral valve is normal in structure. Mild mitral valve regurgitation.  4. The aortic valve is tricuspid. Aortic valve regurgitation is not visualized. FINDINGS  Left Ventricle: Left ventricular ejection fraction, by estimation, is 55 to 60%. The left ventricle has normal function. The left ventricle has no regional wall motion abnormalities. Global longitudinal strain performed but not reported based on interpreter judgement due to suboptimal tracking. The left ventricular internal cavity size was normal in size. There is no left ventricular hypertrophy. Left ventricular diastolic parameters were normal. Right Ventricle: The right ventricular size is normal. No increase in right ventricular wall thickness. Right ventricular systolic function is normal. Left Atrium: Left atrial size was normal in size. Right Atrium: Right atrial size was normal in size. Pericardium: There is no evidence of pericardial effusion. Mitral Valve: The mitral valve is normal in structure. Mild mitral valve regurgitation. Tricuspid Valve: The tricuspid valve is normal in structure. Tricuspid valve regurgitation is not demonstrated. Aortic Valve: The aortic valve is tricuspid. Aortic valve regurgitation is not visualized. Aortic valve mean gradient measures 2.0 mmHg. Aortic valve peak gradient measures 3.2 mmHg. Aortic valve area, by VTI measures 2.79 cm. Pulmonic Valve: The pulmonic valve was not well visualized. Pulmonic valve regurgitation is not visualized. Aorta: The aortic root is normal in size and structure. Venous: The inferior vena cava was not well  visualized. IAS/Shunts: No atrial [name] shunt detected by color flow Doppler.  LEFT VENTRICLE PLAX 2D LVIDd:         4.90 cm     Diastology LVIDs:  3.60 cm     LV e' medial:    8.81 cm/s LV PW:         1.00 cm     LV E/e' medial:  10.6 LV IVS:        1.20 cm     LV e' lateral:   7.18 cm/s LVOT diam:     2.00 cm     LV E/e' lateral: 13.0 LV SV:         47 LV SV Index:   25 LVOT Area:     3.14 cm  LV Volumes (MOD) LV vol d, MOD A2C: 80.1 ml LV vol d, MOD A4C: 91.7 ml LV vol s, MOD A2C: 50.7 ml LV vol s, MOD A4C: 55.3 ml LV SV MOD A2C:     29.4 ml LV SV MOD A4C:     91.7 ml LV SV MOD BP:      32.7 ml RIGHT VENTRICLE RV Basal diam:  2.40 cm RV Mid diam:    2.00 cm RV S prime:     8.49 cm/s TAPSE (M-mode): 2.0 cm LEFT ATRIUM             Index        RIGHT ATRIUM          Index LA diam:        3.10 cm 1.60 cm/m   RA Area:     7.76 cm LA Vol (A2C):   32.3 ml 16.72 ml/m  RA Volume:   13.40 ml 6.94 ml/m LA Vol (A4C):   18.5 ml 9.58 ml/m LA Biplane Vol: 25.1 ml 12.99 ml/m  AORTIC VALVE AV Area (Vmax):    2.56 cm AV Area (Vmean):   2.53 cm AV Area (VTI):     2.79 cm AV Vmax:           89.90 cm/s AV Vmean:          61.300 cm/s AV VTI:            0.170 m AV Peak Grad:      3.2 mmHg AV Mean Grad:      2.0 mmHg LVOT Vmax:         73.40 cm/s LVOT Vmean:        49.400 cm/s LVOT VTI:          0.151 m LVOT/AV VTI ratio: 0.89  AORTA Ao Root diam: 2.60 cm MITRAL VALVE               TRICUSPID VALVE MV Area (PHT): 5.27 cm    TR Peak grad:   14.6 mmHg MV Decel Time: 144 msec    TR Vmax:        191.00 cm/s MV E velocity: 93.30 cm/s MV A velocity: 84.40 cm/s  SHUNTS MV E/A ratio:  1.11        Systemic VTI:  0.15 m                            Systemic Diam: 2.00 cm Redell Cave MD Electronically signed by Redell Cave MD Signature Date/Time: 02/21/2024/12:57:40 PM    Final        Assessment & Plan:  Severe persistent asthma, unspecified whether complicated [facility]) Assessment & Plan: Seeing pulmonary. Now using  advair HFA. On dupixent . Has improved overall. Airsupra  prn. Continue singulair  and flonase . Treat acute flare - prednisone  taper and zpak. Follow.  Call with update.  Erosive esophagitis  Leukocytosis, unspecified type Assessment & Plan: Has been worked up by hematology. Felt to be reactive. Follow cbc.    Abnormal liver function tests  Vitamin D  deficiency  Hypercholesterolemia Assessment & Plan: Low cholesterol diet and exercise. Follow lipid panel.   Orders: -     CBC with Differential/Platelet; Future -     Hepatic function panel; Future -     Lipid panel; Future -     Basic metabolic panel with GFR; Future  Visit for screening mammogram -     3D Screening Mammogram, Left and Right; Future  Seizure disorder [facility]) Assessment & Plan: Being followed by neurology. No reported seizures recently.    MGUS (monoclonal gammopathy of unknown significance) Assessment & Plan: Seeing Dr Babara for f/u MGUS. Evaluated 09/14/23.  Stable.  Recommended check SPEP and light chain ratio. Elevated alkaline phos. GGT normal. Bone scan as outlined. No evidence of metastatic bone disease. Per review of 08/2023 - note - recommended f/u one year.    Gastroesophageal reflux disease, unspecified whether esophagitis present Assessment & Plan: Continue omeprazole .    Coronary artery disease involving native coronary artery of native heart with angina pectoris Assessment & Plan: Admitted 02/22/24 - 02/25/24 - admitted with NSTEMI. Initially presented 02/20/24 - chest pain and sob. CXR, CTPE (negative), TTE - EF 55-60%, no WMA, mild MR). Treated with aspirin  load, IV heparin , IV NTG, high intensity statin. LHC 5/29 - severe one vessel CAD affecting ostial left circumflex and ostial LAD stenosis. Transferred to [facility]. S/p PCE with DES - Lcx vessel. Recommended cardiac rehab. Continue lipitor  and zetia. Continues on imdur.  Continue risk factor modification. F/u with cardiology.    Other orders -      Omeprazole ; Take 1 capsule (40 mg total) by mouth daily.  Dispense: 30 capsule; Refill: 1 -     predniSONE ; Take 6 tablets x 1 day and then decrease by 1/2 tablet per day until down to zero mg.  Dispense: 39 tablet; Refill: 0 -     Azithromycin ; Take 2 tablets on day 1, then 1 tablet daily on days 2 through 5  Dispense: 6 tablet; Refill: 0     Allena Hamilton, MD

## 2024-07-24 NOTE — Telephone Encounter (Signed)
 She has nausea and vomiting with erythromycin . She can take zpak.

## 2024-07-24 NOTE — Telephone Encounter (Signed)
 Pharmacy notified. Pt already picked up meds

## 2024-07-28 ENCOUNTER — Encounter

## 2024-07-30 ENCOUNTER — Encounter

## 2024-08-03 ENCOUNTER — Encounter: Payer: Self-pay | Admitting: Internal Medicine

## 2024-08-03 NOTE — Assessment & Plan Note (Signed)
 Has been worked up by hematology. Felt to be reactive. Follow cbc.

## 2024-08-03 NOTE — Assessment & Plan Note (Signed)
 Low cholesterol diet and exercise.  Follow lipid panel.

## 2024-08-03 NOTE — Assessment & Plan Note (Signed)
 Seeing Dr Babara for f/u MGUS. Evaluated 09/14/23.  Stable.  Recommended check SPEP and light chain ratio. Elevated alkaline phos. GGT normal. Bone scan as outlined. No evidence of metastatic bone disease. Per review of 08/2023 - note - recommended f/u one year.

## 2024-08-03 NOTE — Assessment & Plan Note (Signed)
 Seeing pulmonary. Now using advair HFA. On dupixent . Has improved overall. Airsupra  prn. Continue singulair  and flonase . Treat acute flare - prednisone  taper and zpak. Follow.  Call with update.

## 2024-08-03 NOTE — Assessment & Plan Note (Signed)
 Continue omeprazole

## 2024-08-03 NOTE — Assessment & Plan Note (Signed)
 Being followed by neurology. No reported seizures recently.

## 2024-08-03 NOTE — Assessment & Plan Note (Signed)
 Admitted 02/22/24 - 02/25/24 - admitted with NSTEMI. Initially presented 02/20/24 - chest pain and sob. CXR, CTPE (negative), TTE - EF 55-60%, no WMA, mild MR). Treated with aspirin  load, IV heparin , IV NTG, high intensity statin. LHC 5/29 - severe one vessel CAD affecting ostial left circumflex and ostial LAD stenosis. Transferred to [facility]. S/p PCE with DES - Lcx vessel. Recommended cardiac rehab. Continue lipitor  and zetia. Continues on imdur.  Continue risk factor modification. F/u with cardiology.

## 2024-08-04 ENCOUNTER — Encounter

## 2024-08-08 ENCOUNTER — Encounter: Payer: Self-pay | Admitting: Pulmonary Disease

## 2024-08-08 ENCOUNTER — Other Ambulatory Visit: Payer: Self-pay

## 2024-08-08 DIAGNOSIS — J4550 Severe persistent asthma, uncomplicated: Secondary | ICD-10-CM

## 2024-08-08 MED ORDER — DUPIXENT 300 MG/2ML ~~LOC~~ SOAJ: 300.0000 mg | SUBCUTANEOUS | 1 refills | Status: AC

## 2024-08-08 NOTE — Progress Notes (Signed)
 Rx for Dupixent  triaged to [facility].   Dose: 300mg   every 14 days   Last OV 07/01/24 with Dr. Malka.   Next OV due Jan 2026

## 2024-08-15 ENCOUNTER — Ambulatory Visit (INDEPENDENT_AMBULATORY_CARE_PROVIDER_SITE_OTHER)

## 2024-08-15 DIAGNOSIS — E538 Deficiency of other specified B group vitamins: Secondary | ICD-10-CM | POA: Diagnosis not present

## 2024-08-15 MED ORDER — CYANOCOBALAMIN 1000 MCG/ML IJ SOLN
1000.0000 ug | Freq: Once | INTRAMUSCULAR | Status: AC
  Administered 2024-08-15: 1000 ug via INTRAMUSCULAR

## 2024-08-15 NOTE — Progress Notes (Signed)
 After obtaining consent, and per orders of Dr. Geralyn Knee, MD injection of B-12 injection given IM in R Deltoid by Virgina Grills, CMA. Patient tolerated injection well.

## 2024-08-19 ENCOUNTER — Other Ambulatory Visit: Payer: Self-pay

## 2024-08-19 ENCOUNTER — Emergency Department
Admission: EM | Admit: 2024-08-19 | Discharge: 2024-08-19 | Disposition: A | Discharge status: Home / Self Care | Attending: Emergency Medicine | Admitting: Emergency Medicine

## 2024-08-19 ENCOUNTER — Encounter: Payer: Self-pay | Admitting: Emergency Medicine

## 2024-08-19 DIAGNOSIS — Y93G1 Activity, food preparation and clean up: Secondary | ICD-10-CM | POA: Insufficient documentation

## 2024-08-19 DIAGNOSIS — J45909 Unspecified asthma, uncomplicated: Secondary | ICD-10-CM | POA: Insufficient documentation

## 2024-08-19 DIAGNOSIS — I110 Hypertensive heart disease with heart failure: Secondary | ICD-10-CM | POA: Insufficient documentation

## 2024-08-19 DIAGNOSIS — W268XXA Contact with other sharp object(s), not elsewhere classified, initial encounter: Secondary | ICD-10-CM | POA: Insufficient documentation

## 2024-08-19 DIAGNOSIS — Z79899 Other long term (current) drug therapy: Secondary | ICD-10-CM | POA: Diagnosis not present

## 2024-08-19 DIAGNOSIS — I509 Heart failure, unspecified: Secondary | ICD-10-CM | POA: Insufficient documentation

## 2024-08-19 DIAGNOSIS — Z7982 Long term (current) use of aspirin: Secondary | ICD-10-CM | POA: Diagnosis not present

## 2024-08-19 DIAGNOSIS — Z8616 Personal history of COVID-19: Secondary | ICD-10-CM | POA: Insufficient documentation

## 2024-08-19 DIAGNOSIS — I2510 Atherosclerotic heart disease of native coronary artery without angina pectoris: Secondary | ICD-10-CM | POA: Insufficient documentation

## 2024-08-19 DIAGNOSIS — S61011A Laceration without foreign body of right thumb without damage to nail, initial encounter: Secondary | ICD-10-CM | POA: Insufficient documentation

## 2024-08-19 DIAGNOSIS — S6991XA Unspecified injury of right wrist, hand and finger(s), initial encounter: Secondary | ICD-10-CM | POA: Diagnosis present

## 2024-08-19 MED ORDER — BACITRACIN ZINC 500 UNIT/GM EX OINT
TOPICAL_OINTMENT | Freq: Once | CUTANEOUS | Status: AC
  Administered 2024-08-19: 1 via TOPICAL
  Filled 2024-08-19: qty 0.9

## 2024-08-19 NOTE — ED Provider Notes (Signed)
 [facility] Provider Note    Event Date/Time   First MD Initiated Contact with Patient 08/19/24 [phone]     (approximate)   History   No chief complaint on file.   HPI  Heather [name] is a 48 y.o. female with history of CAD on Effient, CHF, hypertension, migraines, seizures, asthma who presents to the emergency department complaints of laceration to the right thumb.  States she was washing dishes tonight and did not realize that one of her dishes was broken and it took a chunk out of her right thumb over the knuckle.  Could not get it to stop bleeding at home and felt like it needed to be stabilized here.  Not currently bleeding.  She is unsure of her last tetanus vaccine.   History provided by patient.    Past Medical History:  Diagnosis Date   Anxiety    Arthritis    Asthma    CHF (congestive heart failure) (HCC)    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 hauler    Depression    bipolar   Dysrhythmia    Fainting spell    H/O   GERD (gastroesophageal reflux disease)    H/O cardiac arrhythmia    Migraines    Seizures (HCC)    Sleep apnea     Past Surgical History:  Procedure Laterality Date   COLONOSCOPY WITH PROPOFOL  N/A 06/03/2021   Procedure: COLONOSCOPY WITH PROPOFOL ;  Surgeon: Janalyn Keene NOVAK, MD;  Location: [facility] ENDOSCOPY;  Service: Endoscopy;  Laterality: N/A;   ESOPHAGOGASTRODUODENOSCOPY N/A 06/03/2021   Procedure: ESOPHAGOGASTRODUODENOSCOPY (EGD);  Surgeon: Janalyn Keene NOVAK, MD;  Location: [facility] ENDOSCOPY;  Service: Endoscopy;  Laterality: N/A;   LEFT HEART CATH AND CORONARY ANGIOGRAPHY N/A 02/21/2024   Procedure: LEFT HEART CATH AND CORONARY ANGIOGRAPHY;  Surgeon: Darron Deatrice LABOR, MD;  Location: [facility] INVASIVE CV LAB;  Service: Cardiovascular;  Laterality: N/A;   TUBAL LIGATION Bilateral 09/25/2002   tubal reversal      MEDICATIONS:  Prior to Admission medications   Medication Sig Start Date End Date Taking? Authorizing Provider  albuterol   (VENTOLIN  HFA) 108 (90 Base) MCG/ACT inhaler INHALE 1 TO 2 PUFFS BY MOUTH EVERY 4 HOURS AS NEEDED FOR WHEEZING FOR SHORTNESS OF BREATH 08/03/23   [name], MD  Albuterol -Budesonide  (AIRSUPRA ) 90-80 MCG/ACT AERO INHALE 2 PUFFS EVERY 6 HOURS 04/17/24   Assaker, Darrin, MD  amphetamine -dextroamphetamine  (ADDERALL XR) 20 MG 24 hr capsule Take 20 mg by mouth every morning. 07/07/24   [provider]  ascorbic acid (VITAMIN C) 1000 MG tablet Take 500 mg by mouth daily.    [provider]  aspirin  EC 81 MG tablet Take 1 tablet (81 mg total) by mouth daily. Swallow whole. 02/23/24   Dezii, Alexandra, DO  BELSOMRA 20 MG TABS Take 1 tablet by mouth at bedtime. 07/15/24   [provider]  benzonatate  (TESSALON ) 100 MG capsule Take 1 capsule (100 mg total) by mouth every 8 (eight) hours. Patient taking differently: Take 100 mg by mouth every 8 (eight) hours as needed for cough. 01/01/24   [name], MD  BREZTRI  AEROSPHERE 160-9-4.8 MCG/ACT AERO inhaler Inhale 2 puffs into the lungs 2 (two) times daily. 07/02/24   [provider]  cetirizine  (ZYRTEC ) 10 MG tablet Take 1 tablet by mouth once daily 10/04/22   [name], MD  cyanocobalamin  (VITAMIN B12) 1000 MCG/ML injection Inject 1,000 mcg into the muscle every 30 (thirty) days. 04/19/23   [provider]  diazepam  (VALIUM )  10 MG tablet Take 1 tablet by mouth 4 (four) times daily as needed. 11/23/21   [provider]  divalproex  (DEPAKOTE  ER) 500 MG 24 hr tablet Take 1,500 mg by mouth at bedtime. 01/30/24   [provider]  Dupilumab  (DUPIXENT ) 300 MG/2ML SOAJ Inject 300 mg into the skin every 14 (fourteen) days. 08/08/24   Assaker, Darrin, MD  ezetimibe (ZETIA) 10 MG tablet Take 1 tablet by mouth daily. 02/26/24 02/25/25  [provider]  fluticasone  (FLONASE ) 50 MCG/ACT nasal spray Place 1 spray into both nostrils daily. 07/01/24   Assaker, Darrin, MD  fluticasone -salmeterol  (ADVAIR HFA) 230-21 MCG/ACT inhaler Inhale 2 puffs into the lungs 2 (two) times daily. 07/01/24   Assaker, Darrin, MD  ipratropium (ATROVENT ) 0.03 % nasal spray Place 2 sprays into both nostrils every 12 (twelve) hours. 08/21/23   Assaker, Darrin, MD  ipratropium-albuterol  (DUONEB) 0.5-2.5 (3) MG/3ML SOLN Inhale 3 mLs into the lungs every 4 (four) hours as needed. 07/04/24   Assaker, Darrin, MD  isosorbide mononitrate (IMDUR) 30 MG 24 hr tablet Take 30 mg by mouth daily. 03/13/24 03/13/25  [provider]  linaclotide  (LINZESS ) 290 MCG CAPS capsule Take 1 capsule (290 mcg total) by mouth daily before breakfast. Patient taking differently: Take 290 mcg by mouth daily as needed. 01/15/24   Unk Corinn Skiff, MD  losartan (COZAAR) 25 MG tablet Take 25 mg by mouth daily. 03/13/24 03/13/25  [provider]  metoprolol succinate (TOPROL-XL) 25 MG 24 hr tablet Take 1 tablet by mouth daily. 02/26/24 02/25/25  [provider]  montelukast  (SINGULAIR ) 10 MG tablet Take 1 tablet (10 mg total) by mouth at bedtime. 01/04/24   Assaker, Darrin, MD  nitroGLYCERIN  (NITROSTAT ) 0.4 MG SL tablet Place 0.4 mg under the tongue every 5 (five) minutes as needed. 02/25/24 02/24/25  [provider]  nitroGLYCERIN  0.2 mg/mL infusion Inject 0-200 mcg/min into the vein continuous. 02/22/24   Dezii, Alexandra, DO  omeprazole  (PRILOSEC) 40 MG capsule Take 1 capsule (40 mg total) by mouth daily. 07/24/24   [name], MD  ondansetron  (ZOFRAN -ODT) 4 MG disintegrating tablet DISSOLVE 1 TABLET IN MOUTH TWICE DAILY AS NEEDED FOR NAUSEA AND  VOMITING 02/14/24   [name], MD  prasugrel (EFFIENT) 10 MG TABS tablet Take 1 tablet by mouth daily. 02/26/24 02/25/25  [provider]  predniSONE  (DELTASONE ) 10 MG tablet Take 6 tablets x 1 day and then decrease by 1/2 tablet per day until down to zero mg. 07/24/24   [name], MD  rosuvastatin (CRESTOR) 40 MG tablet Take 40 mg by  mouth daily. 03/13/24 03/13/25  [provider]  Spacer/Aero-Holding [place] (AEROCHAMBER MV) inhaler Use as instructed 07/01/24   Assaker, Darrin, MD  topiramate (TOPAMAX) 25 MG tablet Take 50 mg by mouth 2 (two) times daily. 06/21/24   [provider]  Vitamin D , Ergocalciferol , (DRISDOL ) 1.25 MG (50000 UNIT) CAPS capsule Take 1 capsule (50,000 Units total) by mouth every 7 (seven) days. 06/03/24   [name], MD    Physical Exam   Triage Vital Signs: ED Triage Vitals  Encounter Vitals Group     BP 08/19/24 0145 (!) 162/95     Girls Systolic BP Percentile --      Girls Diastolic BP Percentile --      Boys Systolic BP Percentile --      Boys Diastolic BP Percentile --      Pulse Rate 08/19/24 0145 76     Resp 08/19/24 0145 18  Temp 08/19/24 0145 97.8 F (36.6 C)     Temp Source 08/19/24 0145 Oral     SpO2 08/19/24 0145 100 %     Weight --      Height --      Head Circumference --      Peak Flow --      Pain Score 08/19/24 0146 4     Pain Loc --      Pain Education --      Exclude from Growth Chart --     Most recent vital signs: Vitals:   08/19/24 0145 08/19/24 0550  BP: (!) 162/95 (!) 154/94  Pulse: 76 65  Resp: 18 18  Temp: 97.8 F (36.6 C) 98 F (36.7 C)  SpO2: 100% 95%     CONSTITUTIONAL: Alert and responds appropriately to questions. Well-appearing; well-nourished HEAD: Normocephalic, atraumatic EYES: Conjunctivae clear, pupils appear equal ENT: normal nose; moist mucous membranes NECK: Normal range of motion CARD: Regular rate and rhythm RESP: Normal chest excursion without splinting or tachypnea; no hypoxia or respiratory distress, speaking full sentences ABD/GI: non-distended EXT: Normal ROM in all joints, no major deformities noted; superficial small avulsion injury to the right thumb DIP with no active bleeding, 2+ right radial pulse SKIN: Normal color for age and race, no rashes on exposed skin NEURO: Moves all extremities  equally, normal speech, no facial asymmetry noted PSYCH: The patient's mood and manner are appropriate. Grooming and personal hygiene are appropriate.  ED Results / Procedures / Treatments   LABS: (all labs ordered are listed, but only abnormal results are displayed) Labs Reviewed - No data to display   EKG:      RADIOLOGY: My personal review and interpretation of imaging:    I have personally reviewed all radiology reports. No results found.   PROCEDURES:  Critical Care performed: No     Procedures    IMPRESSION / MDM / ASSESSMENT AND PLAN / ED COURSE  I reviewed the triage vital signs and the nursing notes.   Patient here with superficial injury that is not actively bleeding currently to the right thumb.     DIFFERENTIAL DIAGNOSIS (includes but not limited to):   Laceration, doubt fracture, no tendon involvement, no active bleeding  Patient's presentation is most consistent with acute, uncomplicated illness.  PLAN: Patient here with avulsion injury to the thumb.  No tendon involvement.  Wound is superficial.  Doubt bony involvement.  Normal capillary refill and normal pulses.  Normal sensation.  States she wanted to have her finger stabilize as every time she bends it it starts to bleed again because she is on Effient.  Will apply antibiotic ointment, nonadherent dressing and place her in an aluminum thumb splint.  Laceration is not amendable to repair.   MEDICATIONS GIVEN IN ED: Medications  bacitracin  ointment (1 Application Topical Given 08/19/24 0618)     ED COURSE:  At this time, I do not feel there is any life-threatening condition present. I reviewed all nursing notes, vitals, pertinent previous records.  All lab and urine results, EKGs, imaging ordered have been independently reviewed and interpreted by myself.  I reviewed all available radiology reports from any imaging ordered this visit.  Based on my assessment, I feel the patient is safe to be  discharged home without further emergent workup and can continue workup as an outpatient as needed. Discussed all findings, treatment plan as well as usual and customary return precautions.  They verbalize understanding and are comfortable with  this plan.  Outpatient follow-up has been provided as needed.  All questions have been answered.    CONSULTS:  none   OUTSIDE RECORDS REVIEWED: Reviewed recent cardiology notes.     FINAL CLINICAL IMPRESSION(S) / ED DIAGNOSES   Final diagnoses:  Thumb laceration, right, initial encounter     Rx / DC Orders   ED Discharge Orders     None        Note:  This document was prepared using Dragon voice recognition software and may include unintentional dictation errors.   Eugen Jeansonne, Josette SAILOR, DO 08/19/24 [phone]

## 2024-08-19 NOTE — ED Triage Notes (Signed)
 Pt arrives POV ambulatory to triage, gait steady, no acute distress noted c/o laceration to right thumb while washing dishes tonight.pt is on blood thinner and can't get it to stop sleep. Soiled band-aid removed and pressure dressing applied. Last tetanus unknown

## 2024-08-20 ENCOUNTER — Other Ambulatory Visit (HOSPITAL_COMMUNITY)
Admission: RE | Admit: 2024-08-20 | Discharge: 2024-08-20 | Disposition: A | Source: Ambulatory Visit | Attending: Certified Nurse Midwife | Admitting: Certified Nurse Midwife

## 2024-08-20 ENCOUNTER — Ambulatory Visit: Admitting: Certified Nurse Midwife

## 2024-08-20 ENCOUNTER — Encounter: Payer: Self-pay | Admitting: Certified Nurse Midwife

## 2024-08-20 VITALS — BP 133/82 | HR 83 | Ht 66.5 in | Wt 169.1 lb

## 2024-08-20 DIAGNOSIS — Z124 Encounter for screening for malignant neoplasm of cervix: Secondary | ICD-10-CM | POA: Insufficient documentation

## 2024-08-20 DIAGNOSIS — Z1151 Encounter for screening for human papillomavirus (HPV): Secondary | ICD-10-CM

## 2024-08-20 NOTE — Progress Notes (Signed)
 [name], MD   Chief Complaint  Patient presents with   Gynecologic Exam    HPI:      Heather [name] is a 48 y.o. [phone] whose LMP was Patient's last menstrual period was 07/16/2024 (exact date)., presents today for pap smear examination. Patient reports history of abnormal in 2023. Her last pap was LSIL, HPV negative. She was referred to [facility] for further evaluation, at that visit she was told that the pap result may have been a mistake, she discussed colposcopy vs short interval follow up pap vs repeating in a year. She planned to repeat pap at a year, but has not followed up since then. Since that time she has had multiple health problems including NSTEMI and repeat pap was therefore delayed. She declines breast exam or mammogram-she is currently on anticoagulation and prefers to wait to have mammogram due to risk of bruising and injury.  She denies any history of abnormal pap.  Period Pattern: (!) Irregular Menstrual Flow: Moderate Dysmenorrhea: (!) Mild Dysmenorrhea Symptoms: Cramping, Other (Comment) (body aches)  Patient Active Problem List   Diagnosis Date Noted   Absent radial pulse 05/31/2024   Severe persistent asthma (HCC) 04/03/2024   Coronary artery disease with angina pectoris 03/02/2024   NSTEMI (non-ST elevated myocardial infarction) (HCC) 02/21/2024   Bipolar disorder (HCC) 02/21/2024   Overweight (BMI 25.0-29.9) 02/21/2024   Hypokalemia 09/14/2023   COVID-19 virus infection 07/24/2023   Acute sinusitis 07/13/2023   Menstrual changes 04/27/2023   Leukocytosis 02/23/2023   B12 deficiency 01/28/2023   Leg pain 07/19/2022   ADHD 06/30/2022   Irritable bowel syndrome (IBS) 06/29/2022   Abnormal Pap smear of cervix 03/15/2022   Constipation 01/05/2022   Non-intractable vomiting    Hiatal hernia    Retained food in stomach    Special screening for malignant neoplasms, colon    Rectal polyp    Asthmatic bronchitis with acute exacerbation 05/27/2021    Pre-op evaluation 05/24/2021   Anxiety 02/27/2021   Cough 02/25/2021   Vitamin D  deficiency 02/04/2021   Fatigue 09/29/2020   Nausea 09/12/2020   Encounter for completion of form with patient 07/18/2020   History of COVID-19 06/11/2020   Bronchitis 09/22/2019   Sleep disturbance 03/16/2019   Herpes zoster without complication 01/29/2019   History of seizure 01/22/2019   GERD (gastroesophageal reflux disease) 01/05/2019   Back pain 12/21/2018   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   Abdominal pain 11/19/2018   Conjunctivitis 05/23/2018   Sleeping difficulty 10/31/2017   Rash 06/11/2017   Health care maintenance 10/12/2016   History of miscarriage 10/12/2016   Hypercholesterolemia 07/09/2016   Migraine 07/20/2013   Seizure disorder (HCC) 07/20/2013   Asthma 07/20/2013   Alternating constipation and diarrhea 07/20/2013   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    Past Surgical History:  Procedure Laterality Date   COLONOSCOPY WITH PROPOFOL  N/A 06/03/2021   Procedure: COLONOSCOPY WITH PROPOFOL ;  Surgeon: Janalyn Keene NOVAK, MD;  Location: [facility] ENDOSCOPY;  Service: Endoscopy;  Laterality: N/A;   ESOPHAGOGASTRODUODENOSCOPY N/A 06/03/2021   Procedure: ESOPHAGOGASTRODUODENOSCOPY (EGD);  Surgeon: Janalyn Keene NOVAK, MD;  Location: [facility] ENDOSCOPY;  Service: Endoscopy;  Laterality: N/A;   LEFT HEART CATH AND CORONARY ANGIOGRAPHY N/A 02/21/2024   Procedure: LEFT HEART CATH AND CORONARY ANGIOGRAPHY;  Surgeon: Darron Deatrice LABOR, MD;  Location: [facility] INVASIVE CV LAB;  Service: Cardiovascular;  Laterality: N/A;   TUBAL LIGATION Bilateral 09/25/2002   tubal reversal  Family History  Problem Relation Age of Onset   Hypertension Mother    Diabetes Mother    Arthritis Mother    Heart disease Mother    ADD / ADHD Daughter    ADD / ADHD Daughter    ADD / ADHD Son    Breast cancer Neg Hx     Social History   Socioeconomic History   Marital status:  Married    Spouse name: Not on file   Number of children: Not on file   Years of education: Not on file   Highest education level: Not on file  Occupational History   Not on file  Tobacco Use   Smoking status: Former    Current packs/day: 0.00    Average packs/day: 0.5 packs/day for 5.0 years (2.5 ttl pk-yrs)    Types: Cigarettes    Start date: 2008    Quit date: 2013    Years since quitting: 12.9   Smokeless tobacco: Never   Tobacco comments:    Quit mid October 2014  Vaping Use   Vaping status: Never Used  Substance and Sexual Activity   Alcohol use: No    Alcohol/week: 0.0 standard drinks of alcohol   Drug use: No   Sexual activity: Yes    Birth control/protection: None  Other Topics Concern   Not on file  Social History Narrative   Not on file   Social Drivers of Health   Financial Resource Strain: Medium Risk (02/23/2024)   Received from [facility]   Overall Financial Resource Strain (CARDIA)    Difficulty of Paying Living Expenses: Somewhat hard  Food Insecurity: Food Insecurity Present (02/23/2024)   Received from [facility]   Hunger Vital Sign    Within the past 12 months, you worried that your food would run out before you got the money to buy more.: Sometimes true    Within the past 12 months, the food you bought just didn't last and you didn't have money to get more.: Sometimes true  Transportation Needs: No Transportation Needs (02/23/2024)   Received from [facility] - Transportation    In the past 12 months, has lack of transportation kept you from medical appointments or from getting medications?: No    Lack of Transportation (Non-Medical): No  Physical Activity: Unknown (10/30/2023)   Exercise Vital Sign    Days of Exercise per Week: Patient declined    Minutes of Exercise per Session: Not on file  Stress: Stress Concern Present (10/30/2023)   [employer] -  Occupational Stress Questionnaire    Feeling of Stress : To some extent  Social Connections: Unknown (10/30/2023)   Social Connection and Isolation Panel    Frequency of Communication with Friends and Family: Patient declined    Frequency of Social Gatherings with Friends and Family: Never    Attends Religious Services: Never    Database Administrator or Organizations: No    Attends Engineer, Structural: Not on file    Marital Status: Patient declined  Intimate Partner Violence: Patient Unable To Answer (02/21/2024)   Humiliation, Afraid, Rape, and Kick questionnaire    Fear of Current or Ex-Partner: Patient unable to answer    Emotionally Abused: Patient unable to answer    Physically Abused: Patient unable to answer    Sexually Abused: Patient unable to answer    Outpatient Medications Prior to Visit  Medication Sig Dispense Refill  albuterol  (VENTOLIN  HFA) 108 (90 Base) MCG/ACT inhaler INHALE 1 TO 2 PUFFS BY MOUTH EVERY 4 HOURS AS NEEDED FOR WHEEZING FOR SHORTNESS OF BREATH 18 g 1   Albuterol -Budesonide  (AIRSUPRA ) 90-80 MCG/ACT AERO INHALE 2 PUFFS EVERY 6 HOURS 11 g 6   amphetamine -dextroamphetamine  (ADDERALL XR) 20 MG 24 hr capsule Take 20 mg by mouth every morning.     ascorbic acid (VITAMIN C) 1000 MG tablet Take 500 mg by mouth daily.     aspirin  EC 81 MG tablet Take 1 tablet (81 mg total) by mouth daily. Swallow whole.     BELSOMRA 20 MG TABS Take 1 tablet by mouth at bedtime.     benzonatate  (TESSALON ) 100 MG capsule Take 1 capsule (100 mg total) by mouth every 8 (eight) hours. (Patient taking differently: Take 100 mg by mouth every 8 (eight) hours as needed for cough.) 21 capsule 0   BREZTRI  AEROSPHERE 160-9-4.8 MCG/ACT AERO inhaler Inhale 2 puffs into the lungs 2 (two) times daily.     cetirizine  (ZYRTEC ) 10 MG tablet Take 1 tablet by mouth once daily 90 tablet 3   cyanocobalamin  (VITAMIN B12) 1000 MCG/ML injection Inject 1,000 mcg into the muscle every 30 (thirty)  days.     diazepam  (VALIUM ) 10 MG tablet Take 1 tablet by mouth 4 (four) times daily as needed.     divalproex  (DEPAKOTE  ER) 500 MG 24 hr tablet Take 1,500 mg by mouth at bedtime.     Dupilumab  (DUPIXENT ) 300 MG/2ML SOAJ Inject 300 mg into the skin every 14 (fourteen) days. 12 mL 1   ezetimibe (ZETIA) 10 MG tablet Take 1 tablet by mouth daily.     fluticasone  (FLONASE ) 50 MCG/ACT nasal spray Place 1 spray into both nostrils daily. 48 g 1   fluticasone -salmeterol (ADVAIR HFA) 230-21 MCG/ACT inhaler Inhale 2 puffs into the lungs 2 (two) times daily. 1 each 12   ipratropium (ATROVENT ) 0.03 % nasal spray Place 2 sprays into both nostrils every 12 (twelve) hours. 30 mL 12   ipratropium-albuterol  (DUONEB) 0.5-2.5 (3) MG/3ML SOLN Inhale 3 mLs into the lungs every 4 (four) hours as needed. 360 mL 11   isosorbide mononitrate (IMDUR) 30 MG 24 hr tablet Take 30 mg by mouth daily.     linaclotide  (LINZESS ) 290 MCG CAPS capsule Take 1 capsule (290 mcg total) by mouth daily before breakfast. (Patient taking differently: Take 290 mcg by mouth daily as needed.) 30 capsule 1   losartan (COZAAR) 25 MG tablet Take 25 mg by mouth daily.     metoprolol succinate (TOPROL-XL) 25 MG 24 hr tablet Take 1 tablet by mouth daily.     montelukast  (SINGULAIR ) 10 MG tablet Take 1 tablet (10 mg total) by mouth at bedtime. 30 tablet 11   nitroGLYCERIN  (NITROSTAT ) 0.4 MG SL tablet Place 0.4 mg under the tongue every 5 (five) minutes as needed.     nitroGLYCERIN  0.2 mg/mL infusion Inject 0-200 mcg/min into the vein continuous.     omeprazole  (PRILOSEC) 40 MG capsule Take 1 capsule (40 mg total) by mouth daily. 30 capsule 1   ondansetron  (ZOFRAN -ODT) 4 MG disintegrating tablet DISSOLVE 1 TABLET IN MOUTH TWICE DAILY AS NEEDED FOR NAUSEA AND  VOMITING 20 tablet 0   polyethylene glycol powder (GLYCOLAX /MIRALAX ) 17 GM/SCOOP powder Take 17 g by mouth.     prasugrel (EFFIENT) 10 MG TABS tablet Take 1 tablet by mouth daily.      rosuvastatin (CRESTOR) 40 MG tablet Take 40 mg by mouth daily.  Spacer/Aero-Holding Chambers (AEROCHAMBER MV) inhaler Use as instructed 1 each 0   topiramate (TOPAMAX) 25 MG tablet Take 50 mg by mouth 2 (two) times daily.     Ubrogepant 100 MG TABS Take 100 mg by mouth.     Vitamin D , Ergocalciferol , (DRISDOL ) 1.25 MG (50000 UNIT) CAPS capsule Take 1 capsule (50,000 Units total) by mouth every 7 (seven) days. 12 capsule 0   predniSONE  (DELTASONE ) 10 MG tablet Take 6 tablets x 1 day and then decrease by 1/2 tablet per day until down to zero mg. 39 tablet 0   No facility-administered medications prior to visit.      ROS:  Review of Systems  Constitutional: Negative.   Respiratory: Negative.    Cardiovascular: Negative.   Genitourinary: Negative.      OBJECTIVE:   Vitals:  BP 133/82   Pulse 83   Ht 5' 6.5 (1.689 m)   Wt 169 lb 1.6 oz (76.7 kg)   LMP 07/16/2024 (Exact Date)   BMI 26.88 kg/m   Physical Exam Vitals reviewed.  Constitutional:      Appearance: Normal appearance.  Genitourinary:    General: Normal vulva.     Vagina: Normal.     Cervix: Friability present.  Neurological:     General: No focal deficit present.     Mental Status: [name] and oriented to person, place, and time.  Psychiatric:        Mood and Affect: Mood normal.        Behavior: Behavior normal.     Results: No results found for this or any previous visit (from the past 24 hours).   Assessment/Plan: 1. Screening for cervical cancer (Primary) - Cytology - PAP  2. Encounter for screening for human papillomavirus (HPV)  Discussed ASCCP guidelines for LSIL, HPV negative without history of abnormal and that 3y follow up was recommended with HPV cotest. Reassurance offered that while she had slightly abnormal cells the risk of developing cancer is still quite low. Reviewed if pap returns abnormal colposcopy may be indicated.    No orders of the defined types were placed in this  encounter.    Harlene LITTIE Cisco, CNM 08/20/2024 2:23 PM

## 2024-08-28 LAB — CYTOLOGY - PAP
Comment: NEGATIVE
Comment: NEGATIVE
Comment: NEGATIVE
HPV 16: NEGATIVE
HPV 18 / 45: NEGATIVE
High risk HPV: POSITIVE — AB

## 2024-08-29 ENCOUNTER — Encounter: Payer: Self-pay | Admitting: Certified Nurse Midwife

## 2024-08-29 ENCOUNTER — Ambulatory Visit: Payer: Self-pay | Admitting: Certified Nurse Midwife

## 2024-08-29 DIAGNOSIS — R87622 Low grade squamous intraepithelial lesion on cytologic smear of vagina (LGSIL): Secondary | ICD-10-CM | POA: Insufficient documentation

## 2024-09-01 ENCOUNTER — Encounter: Payer: Self-pay | Admitting: Internal Medicine

## 2024-09-01 ENCOUNTER — Ambulatory Visit: Admitting: Internal Medicine

## 2024-09-01 VITALS — BP 120/74 | HR 80 | Temp 97.5°F | Ht 66.5 in | Wt 164.6 lb

## 2024-09-01 MED ORDER — OMEPRAZOLE 40 MG PO CPDR: 40.0000 mg | DELAYED_RELEASE_CAPSULE | Freq: Every day | ORAL | 1 refills | Status: AC

## 2024-09-01 NOTE — Progress Notes (Unsigned)
 Subjective:    Patient ID: Heather [name], female    DOB: 11/28/1975, 48 y.o.   MRN: [id number]  Patient here for  Chief Complaint  Patient presents with   Medical Management of Chronic Issues    HPI Here for a scheduled follow up - follow up regarding asthma and long covid. Recently saw gyn. Pap abnormal with positive HPV. Planning colposcopy. Had f/u with ST 08/28/24. Seeing regularly. Had f/u with Dr Florencio - 08/15/24 - f/u non STEMI 01/2024 with PCI and stent. Continues on prasugrel/asprin/imdur/statin/beta blocker/ARB. Stable. No changes made. Had f/u - long covid 07/29/24 - recommended EMG - right lower extremity.  Recommended botox. Feels her asthma is better overall. Continues dupixent . Using advair regularly with spacer. Has and is using neb treatments for break through - as needed. Did have a fall - hit her right ankle on the table. Bruising is better. Right leg - pulls - when sitting. Upright worse. Discussed stretches. Increased fatigue persists. Therapy has limited her to trying to exercise 15 minutes throughout the day. She is off topamax. Increased stress related to above.   Past Medical History:  Diagnosis Date   Anxiety    Arthritis    Asthma    CHF (congestive heart failure) (HCC)    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 hauler    Depression    bipolar   Dysrhythmia    Fainting spell    H/O   GERD (gastroesophageal reflux disease)    H/O cardiac arrhythmia    Migraines    Seizures (HCC)    Sleep apnea    Past Surgical History:  Procedure Laterality Date   COLONOSCOPY WITH PROPOFOL  N/A 06/03/2021   Procedure: COLONOSCOPY WITH PROPOFOL ;  Surgeon: Janalyn Keene NOVAK, MD;  Location: [facility] ENDOSCOPY;  Service: Endoscopy;  Laterality: N/A;   ESOPHAGOGASTRODUODENOSCOPY N/A 06/03/2021   Procedure: ESOPHAGOGASTRODUODENOSCOPY (EGD);  Surgeon: Janalyn Keene NOVAK, MD;  Location: [facility] ENDOSCOPY;  Service: Endoscopy;  Laterality: N/A;   LEFT HEART CATH AND CORONARY ANGIOGRAPHY N/A 02/21/2024    Procedure: LEFT HEART CATH AND CORONARY ANGIOGRAPHY;  Surgeon: Darron Deatrice LABOR, MD;  Location: [facility] INVASIVE CV LAB;  Service: Cardiovascular;  Laterality: N/A;   TUBAL LIGATION Bilateral 09/25/2002   tubal reversal     Family History  Problem Relation Age of Onset   Hypertension Mother    Diabetes Mother    Arthritis Mother    Heart disease Mother    ADD / ADHD Daughter    ADD / ADHD Daughter    ADD / ADHD Son    Breast cancer Neg Hx    Social History   Socioeconomic History   Marital status: Married    Spouse name: Not on file   Number of children: Not on file   Years of education: Not on file   Highest education [name]: Not on file  Occupational History   Not on file  Tobacco Use   Smoking status: Former    Current packs/day: 0.00    Average packs/day: 0.5 packs/day for 5.0 years (2.5 ttl pk-yrs)    Types: Cigarettes    Start date: 2008    Quit date: 2013    Years since quitting: 12.9   Smokeless tobacco: Never   Tobacco comments:    Quit mid October 2014  Vaping Use   Vaping status: Never Used  Substance and Sexual Activity   Alcohol use: No    Alcohol/week: 0.0 standard drinks of alcohol   Drug use: No   Sexual  activity: Yes    Birth control/protection: None  Other Topics Concern   Not on file  Social History Narrative   Not on file   Social Drivers of Health   Tobacco Use: Medium Risk (09/07/2024)   Patient History    Smoking Tobacco Use: Former    Smokeless Tobacco Use: Never    Passive Exposure: Not on file  Financial Resource Strain: Medium Risk (02/23/2024)   Received from [facility]   Overall Financial Resource Strain (CARDIA)    Difficulty of Paying Living Expenses: Somewhat hard  Food Insecurity: Food Insecurity Present (02/23/2024)   Received from [facility]   Epic    Within the past 12 months, you worried that your food would run out before you got the money to buy more.: Sometimes true    Within the  past 12 months, the food you bought just didn't last and you didn't have money to get more.: Sometimes true  Transportation Needs: No Transportation Needs (02/23/2024)   Received from [facility] - Transportation    In the past 12 months, has lack of transportation kept you from medical appointments or from getting medications?: No    Lack of Transportation (Non-Medical): No  Physical Activity: Unknown (10/30/2023)   Exercise Vital Sign    Days of Exercise per Week: Patient declined    Minutes of Exercise per Session: Not on file  Stress: Stress Concern Present (10/30/2023)   [employer] - Occupational Stress Questionnaire    Feeling of Stress : To some extent  Social Connections: Unknown (10/30/2023)   Social Connection and Isolation Panel    Frequency of Communication with Friends and Family: Patient declined    Frequency of Social Gatherings with Friends and Family: Never    Attends Religious Services: Never    Database Administrator or Organizations: No    Attends Engineer, Structural: Not on file    Marital Status: Patient declined  Depression (PHQ2-9): High Risk (09/01/2024)   Depression (PHQ2-9)    PHQ-2 Score: 20  Alcohol Screen: Not on file  Housing: High Risk (03/13/2024)   Received from [facility]   Epic    In the last 12 months, was there a time when you were not able to pay the mortgage or rent on time?: Yes    In the past 12 months, how many times have you moved where you were living?: 0    At any time in the past 12 months, were you homeless or living in a shelter (including now)?: No  Utilities: Patient Declined (02/23/2024)   Received from [facility]ities    Threatened with loss of utilities: Patient declined  Health Literacy: Not on file     Review of Systems  Constitutional:  Positive for fatigue. Negative for appetite change.  HENT:         No  increased congestion.   Respiratory:         Breathing overall improved. Cough is better.   Cardiovascular:  Negative for chest pain, palpitations and leg swelling.  Gastrointestinal:  Negative for abdominal pain, diarrhea and vomiting.  Genitourinary:  Negative for difficulty urinating and dysuria.  Musculoskeletal:        Right leg pulling sensation as outlined   Skin:  Negative for color change and rash.  Neurological:  Negative for dizziness and headaches.  Psychiatric/Behavioral:  Negative  for agitation.        Increased stress related to health issues.        Objective:     BP 120/74   Pulse 80   Temp (!) 97.5 F (36.4 C) (Oral)   Ht 5' 6.5 (1.689 m)   Wt 164 lb 9.6 oz (74.7 kg)   LMP 07/16/2024 (Exact Date)   SpO2 97%   BMI 26.17 kg/m  Wt Readings from Last 3 Encounters:  09/01/24 164 lb 9.6 oz (74.7 kg)  08/20/24 169 lb 1.6 oz (76.7 kg)  07/24/24 165 lb (74.8 kg)    Physical Exam Vitals reviewed.  Constitutional:      General: She is not in acute distress.    Appearance: Normal appearance.  HENT:     Head: Normocephalic and atraumatic.     Right Ear: External ear normal.     Left Ear: External ear normal.     Mouth/Throat:     Pharynx: No oropharyngeal exudate or posterior oropharyngeal erythema.  Eyes:     General: No scleral icterus.       Right eye: No discharge.        Left eye: No discharge.     Conjunctiva/sclera: Conjunctivae normal.  Neck:     Thyroid : No thyromegaly.  Cardiovascular:     Rate and Rhythm: Normal rate and regular rhythm.  Pulmonary:     Breath sounds: Normal breath sounds.     Comments: Increased air movement.  Abdominal:     General: Bowel sounds are normal.     Palpations: Abdomen is soft.     Tenderness: There is no abdominal tenderness.  Musculoskeletal:        General: No swelling or tenderness.     Cervical back: Neck supple. No tenderness.  Lymphadenopathy:     Cervical: No cervical adenopathy.  Skin:     Findings: No erythema or rash.  Neurological:     Mental Status: [name].  Psychiatric:        Mood and Affect: Mood normal.        Behavior: Behavior normal.         Outpatient Encounter Medications as of 09/01/2024  Medication Sig   albuterol  (VENTOLIN  HFA) 108 (90 Base) MCG/ACT inhaler INHALE 1 TO 2 PUFFS BY MOUTH EVERY 4 HOURS AS NEEDED FOR WHEEZING FOR SHORTNESS OF BREATH   Albuterol -Budesonide  (AIRSUPRA ) 90-80 MCG/ACT AERO INHALE 2 PUFFS EVERY 6 HOURS   amphetamine -dextroamphetamine  (ADDERALL XR) 20 MG 24 hr capsule Take 20 mg by mouth every morning.   ascorbic acid (VITAMIN C) 1000 MG tablet Take 500 mg by mouth daily.   aspirin  EC 81 MG tablet Take 1 tablet (81 mg total) by mouth daily. Swallow whole.   BELSOMRA 20 MG TABS Take 1 tablet by mouth at bedtime.   benzonatate  (TESSALON ) 100 MG capsule Take 1 capsule (100 mg total) by mouth every 8 (eight) hours. (Patient taking differently: Take 100 mg by mouth every 8 (eight) hours as needed for cough.)   cetirizine  (ZYRTEC ) 10 MG tablet Take 1 tablet by mouth once daily   cyanocobalamin  (VITAMIN B12) 1000 MCG/ML injection Inject 1,000 mcg into the muscle every 30 (thirty) days.   diazepam  (VALIUM ) 10 MG tablet Take 1 tablet by mouth 4 (four) times daily as needed.   divalproex  (DEPAKOTE  ER) 500 MG 24 hr tablet Take 1,500 mg by mouth at bedtime.   Dupilumab  (DUPIXENT ) 300 MG/2ML SOAJ Inject 300 mg into the skin every  14 (fourteen) days.   ezetimibe (ZETIA) 10 MG tablet Take 1 tablet by mouth daily.   fluticasone  (FLONASE ) 50 MCG/ACT nasal spray Place 1 spray into both nostrils daily.   fluticasone -salmeterol (ADVAIR HFA) 230-21 MCG/ACT inhaler Inhale 2 puffs into the lungs 2 (two) times daily.   INCRUSE ELLIPTA  62.5 MCG/ACT AEPB 1 puff daily.   ipratropium (ATROVENT ) 0.03 % nasal spray Place 2 sprays into both nostrils every 12 (twelve) hours.   ipratropium-albuterol  (DUONEB) 0.5-2.5 (3) MG/3ML SOLN Inhale 3 mLs into the lungs  every 4 (four) hours as needed.   isosorbide mononitrate (IMDUR) 30 MG 24 hr tablet Take 30 mg by mouth daily.   linaclotide  (LINZESS ) 290 MCG CAPS capsule Take 1 capsule (290 mcg total) by mouth daily before breakfast. (Patient taking differently: Take 290 mcg by mouth daily as needed.)   losartan (COZAAR) 25 MG tablet Take 25 mg by mouth daily.   metoprolol succinate (TOPROL-XL) 25 MG 24 hr tablet Take 1 tablet by mouth daily.   montelukast  (SINGULAIR ) 10 MG tablet Take 1 tablet (10 mg total) by mouth at bedtime.   nitroGLYCERIN  (NITROSTAT ) 0.4 MG SL tablet Place 0.4 mg under the tongue every 5 (five) minutes as needed.   ondansetron  (ZOFRAN -ODT) 4 MG disintegrating tablet DISSOLVE 1 TABLET IN MOUTH TWICE DAILY AS NEEDED FOR NAUSEA AND  VOMITING   polyethylene glycol powder (GLYCOLAX /MIRALAX ) 17 GM/SCOOP powder Take 17 g by mouth.   prasugrel (EFFIENT) 10 MG TABS tablet Take 1 tablet by mouth daily.   rosuvastatin (CRESTOR) 40 MG tablet Take 40 mg by mouth daily.   Spacer/Aero-Holding Chambers (AEROCHAMBER MV) inhaler Use as instructed   Ubrogepant 100 MG TABS Take 100 mg by mouth.   Vitamin D , Ergocalciferol , (DRISDOL ) 1.25 MG (50000 UNIT) CAPS capsule Take 1 capsule (50,000 Units total) by mouth every 7 (seven) days.   nitroGLYCERIN  0.2 mg/mL infusion Inject 0-200 mcg/min into the vein continuous.   omeprazole  (PRILOSEC) 40 MG capsule Take 1 capsule (40 mg total) by mouth daily.   topiramate (TOPAMAX) 25 MG tablet Take 50 mg by mouth 2 (two) times daily.   [DISCONTINUED] BREZTRI  AEROSPHERE 160-9-4.8 MCG/ACT AERO inhaler Inhale 2 puffs into the lungs 2 (two) times daily.   [DISCONTINUED] omeprazole  (PRILOSEC) 40 MG capsule Take 1 capsule (40 mg total) by mouth daily.   [DISCONTINUED] predniSONE  (DELTASONE ) 10 MG tablet Take 6 tablets x 1 day and then decrease by 1/2 tablet per day until down to zero mg.   No facility-administered encounter medications on file as of 09/01/2024.     Lab  Results  Component Value Date   WBC 13.0 (H) 06/16/2024   HGB 14.1 06/16/2024   HCT 42.9 06/16/2024   PLT 231.0 06/16/2024   GLUCOSE 95 05/30/2024   CHOL 95 (L) 05/30/2024   TRIG 129 05/30/2024   HDL 41 05/30/2024   LDLDIRECT 136.0 07/10/2022   LDLCALC 31 05/30/2024   ALT 37 (H) 06/16/2024   AST 24 06/16/2024   NA 141 05/30/2024   K 4.2 06/24/2024   CL 104 05/30/2024   CREATININE 0.75 05/30/2024   BUN 11 05/30/2024   CO2 21 05/30/2024   TSH 1.640 05/30/2024   INR 1.0 02/20/2024   HGBA1C 4.7 (L) 02/21/2024       Assessment & Plan:  Vitamin D  deficiency Assessment & Plan: Vitamin D  supplements. Follow vitamin D  [name].    Severe persistent asthma, unspecified whether complicated [facility]) Assessment & Plan: Seeing pulmonary. Now using advair HFA. On  dupixent . Has improved overall. Airsupra  prn. Continue singulair  and flonase . No change today. Call with update.    Seizure disorder [facility]) Assessment & Plan: Being followed by neurology.    MGUS (monoclonal gammopathy of unknown significance) Assessment & Plan: Seeing Dr Babara for f/u MGUS. Evaluated 09/14/23.  Stable.  Recommended check SPEP and light chain ratio. Elevated alkaline phos. GGT normal. Bone scan as outlined. No evidence of metastatic bone disease. Per review of 08/2023 - note - recommended f/u one year.    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 Planning colposcopy.    Hypercholesterolemia Assessment & Plan: Low cholesterol diet and exercise. Follow lipid panel.    Gastroesophageal reflux disease, unspecified whether esophagitis present Assessment & Plan: Continues on omeprazole .    Other fatigue Assessment & Plan: Persistent issue since covid. Being followed by PT and ST.  Per her report, they have recommended 15 minutes if exercise throughout the day. Follow.    Coronary artery disease involving  native coronary artery of native heart with angina pectoris Assessment & Plan: Admitted 02/22/24 - 02/25/24 - admitted with NSTEMI. Initially presented 02/20/24 - chest pain and sob. CXR, CTPE (negative), TTE - EF 55-60%, no WMA, mild MR). Treated with aspirin  load, IV heparin , IV NTG, high intensity statin. LHC 5/29 - severe one vessel CAD affecting ostial left circumflex and ostial LAD stenosis. Transferred to [facility]. S/p PCE with DES - Lcx vessel. Recommended cardiac rehab. Continue lipitor  and zetia. Continues on imdur.  Continue risk factor modification. F/u with cardiology.    Anxiety Assessment & Plan: Followed by psychiatry.    Attention deficit hyperactivity disorder (ADHD), unspecified ADHD type Assessment & Plan: Evaluation [facility] 02/09/22 - [facility]). Off medication given recent cardiac admission. Stable.    Other orders -     Omeprazole ; Take 1 capsule (40 mg total) by mouth daily.  Dispense: 90 capsule; Refill: 1     Allena Hamilton, MD

## 2024-09-07 ENCOUNTER — Encounter: Payer: Self-pay | Admitting: Internal Medicine

## 2024-09-07 NOTE — Assessment & Plan Note (Signed)
 Followed by psychiatry

## 2024-09-07 NOTE — Assessment & Plan Note (Signed)
 Persistent issue since covid. Being followed by PT and ST.  Per her report, they have recommended 15 minutes if exercise throughout the day. Follow.

## 2024-09-07 NOTE — Assessment & Plan Note (Signed)
 Admitted 02/22/24 - 02/25/24 - admitted with NSTEMI. Initially presented 02/20/24 - chest pain and sob. CXR, CTPE (negative), TTE - EF 55-60%, no WMA, mild MR). Treated with aspirin  load, IV heparin , IV NTG, high intensity statin. LHC 5/29 - severe one vessel CAD affecting ostial left circumflex and ostial LAD stenosis. Transferred to [facility]. S/p PCE with DES - Lcx vessel. Recommended cardiac rehab. Continue lipitor  and zetia. Continues on imdur.  Continue risk factor modification. F/u with cardiology.

## 2024-09-07 NOTE — Assessment & Plan Note (Signed)
 Evaluation [facility] 02/09/22 - Heather [name]). Off medication given recent cardiac admission. Stable.

## 2024-09-07 NOTE — Assessment & Plan Note (Signed)
 Seeing gyn. Planning colposcopy.

## 2024-09-07 NOTE — Assessment & Plan Note (Signed)
Being followed by neurology.   

## 2024-09-07 NOTE — Assessment & Plan Note (Signed)
Continues on omeprazole.   

## 2024-09-07 NOTE — Assessment & Plan Note (Signed)
Vitamin D supplements.  Follow vitamin D level.  

## 2024-09-07 NOTE — Assessment & Plan Note (Signed)
 Seeing Dr Babara for f/u MGUS. Evaluated 09/14/23.  Stable.  Recommended check SPEP and light chain ratio. Elevated alkaline phos. GGT normal. Bone scan as outlined. No evidence of metastatic bone disease. Per review of 08/2023 - note - recommended f/u one year.

## 2024-09-07 NOTE — Assessment & Plan Note (Signed)
 Seeing pulmonary. Now using advair HFA. On dupixent . Has improved overall. Airsupra  prn. Continue singulair  and flonase . No change today. Call with update.

## 2024-09-07 NOTE — Assessment & Plan Note (Signed)
 Low cholesterol diet and exercise.  Follow lipid panel.

## 2024-09-11 ENCOUNTER — Inpatient Hospital Stay: Payer: BC Managed Care – PPO

## 2024-09-11 ENCOUNTER — Telehealth: Payer: Self-pay | Admitting: Oncology

## 2024-09-11 NOTE — Telephone Encounter (Signed)
 Pt called to r/s lab - pt confirmed new appt date/time - LH

## 2024-09-12 ENCOUNTER — Inpatient Hospital Stay: Attending: Oncology

## 2024-09-12 DIAGNOSIS — D472 Monoclonal gammopathy: Secondary | ICD-10-CM | POA: Insufficient documentation

## 2024-09-12 LAB — CMP (CANCER CENTER ONLY)
ALT: 36 U/L (ref 0–44)
AST: 28 U/L (ref 15–41)
Albumin: 4.5 g/dL (ref 3.5–5.0)
Alkaline Phosphatase: 122 U/L (ref 38–126)
Anion gap: 15 (ref 5–15)
BUN: 7 mg/dL (ref 6–20)
CO2: 21 mmol/L — ABNORMAL LOW (ref 22–32)
Calcium: 9.4 mg/dL (ref 8.9–10.3)
Chloride: 106 mmol/L (ref 98–111)
Creatinine: 0.71 mg/dL (ref 0.44–1.00)
GFR, Estimated: >60 mL/min
Glucose, Bld: 159 mg/dL — ABNORMAL HIGH (ref 70–99)
Potassium: 3.5 mmol/L (ref 3.5–5.1)
Sodium: 141 mmol/L (ref 135–145)
Total Bilirubin: 0.3 mg/dL (ref 0.0–1.2)
Total Protein: 6.8 g/dL (ref 6.5–8.1)

## 2024-09-12 LAB — CBC WITH DIFFERENTIAL (CANCER CENTER ONLY)
Abs Immature Granulocytes: 0.07 K/uL (ref 0.00–0.07)
Basophils Absolute: 0.1 K/uL (ref 0.0–0.1)
Basophils Relative: 1 %
Eosinophils Absolute: 0 K/uL (ref 0.0–0.5)
Eosinophils Relative: 0 %
HCT: 40.9 % (ref 36.0–46.0)
Hemoglobin: 13.5 g/dL (ref 12.0–15.0)
Immature Granulocytes: 1 %
Lymphocytes Relative: 11 %
Lymphs Abs: 1.5 K/uL (ref 0.7–4.0)
MCH: 27.3 pg (ref 26.0–34.0)
MCHC: 33 g/dL (ref 30.0–36.0)
MCV: 82.8 fL (ref 80.0–100.0)
Monocytes Absolute: 0.2 K/uL (ref 0.1–1.0)
Monocytes Relative: 1 %
Neutro Abs: 11.8 K/uL — ABNORMAL HIGH (ref 1.7–7.7)
Neutrophils Relative %: 86 %
Platelet Count: 227 K/uL (ref 150–400)
RBC: 4.94 MIL/uL (ref 3.87–5.11)
RDW: 13.3 % (ref 11.5–15.5)
WBC Count: 13.7 K/uL — ABNORMAL HIGH (ref 4.0–10.5)
nRBC: 0 % (ref 0.0–0.2)

## 2024-09-15 ENCOUNTER — Ambulatory Visit

## 2024-09-15 LAB — KAPPA/LAMBDA LIGHT CHAINS
Kappa free light chain: 7.4 mg/L (ref 3.3–19.4)
Kappa, lambda light chain ratio: 0.96 (ref 0.26–1.65)
Lambda free light chains: 7.7 mg/L (ref 5.7–26.3)

## 2024-09-16 LAB — MULTIPLE MYELOMA PANEL, SERUM
Albumin SerPl Elph-Mcnc: 3.9 g/dL (ref 2.9–4.4)
Albumin/Glob SerPl: 1.7 (ref 0.7–1.7)
Alpha 1: 0.3 g/dL (ref 0.0–0.4)
Alpha2 Glob SerPl Elph-Mcnc: 0.7 g/dL (ref 0.4–1.0)
B-Globulin SerPl Elph-Mcnc: 0.8 g/dL (ref 0.7–1.3)
Gamma Glob SerPl Elph-Mcnc: 0.5 g/dL (ref 0.4–1.8)
Globulin, Total: 2.4 g/dL (ref 2.2–3.9)
IgA: 92 mg/dL (ref 87–352)
IgG (Immunoglobin G), Serum: 584 mg/dL — ABNORMAL LOW (ref 586–1602)
IgM (Immunoglobulin M), Srm: 62 mg/dL (ref 26–217)
Total Protein ELP: 6.3 g/dL (ref 6.0–8.5)

## 2024-09-19 ENCOUNTER — Ambulatory Visit

## 2024-09-19 ENCOUNTER — Ambulatory Visit: Payer: BC Managed Care – PPO | Admitting: Oncology

## 2024-09-19 DIAGNOSIS — E538 Deficiency of other specified B group vitamins: Secondary | ICD-10-CM | POA: Diagnosis not present

## 2024-09-19 MED ORDER — CYANOCOBALAMIN 1000 MCG/ML IJ SOLN
1000.0000 ug | Freq: Once | INTRAMUSCULAR | Status: AC
  Administered 2024-09-19: 1000 ug via INTRAMUSCULAR

## 2024-09-19 NOTE — Progress Notes (Addendum)
 After obtaining consent, and per orders of Dr.Scott, MD injection of B-12 given IM in L Deltoid by Doyce Croak, CMA. Patient tolerated injection well.

## 2024-09-22 ENCOUNTER — Encounter: Payer: Self-pay | Admitting: Emergency Medicine

## 2024-09-22 ENCOUNTER — Other Ambulatory Visit: Payer: Self-pay | Admitting: Internal Medicine

## 2024-09-22 ENCOUNTER — Encounter: Payer: Self-pay | Admitting: Oncology

## 2024-09-22 ENCOUNTER — Ambulatory Visit
Admission: EM | Admit: 2024-09-22 | Discharge: 2024-09-22 | Disposition: A | Discharge status: Home / Self Care | Attending: Emergency Medicine | Admitting: Emergency Medicine

## 2024-09-22 DIAGNOSIS — J4541 Moderate persistent asthma with (acute) exacerbation: Secondary | ICD-10-CM

## 2024-09-22 DIAGNOSIS — Z20828 Contact with and (suspected) exposure to other viral communicable diseases: Secondary | ICD-10-CM

## 2024-09-22 DIAGNOSIS — J4521 Mild intermittent asthma with (acute) exacerbation: Secondary | ICD-10-CM

## 2024-09-22 MED ORDER — BENZONATATE 100 MG PO CAPS: 100.0000 mg | ORAL_CAPSULE | Freq: Three times a day (TID) | ORAL | 0 refills | Status: AC

## 2024-09-22 MED ORDER — PREDNISONE 10 MG (21) PO TBPK: ORAL_TABLET | Freq: Every day | ORAL | 0 refills | Status: AC

## 2024-09-22 MED ORDER — IPRATROPIUM-ALBUTEROL 0.5-2.5 (3) MG/3ML IN SOLN
3.0000 mL | Freq: Once | RESPIRATORY_TRACT | Status: AC
  Administered 2024-09-22: 3 mL via RESPIRATORY_TRACT

## 2024-09-22 MED ORDER — GUAIFENESIN-CODEINE 100-10 MG/5ML PO SOLN: 5.0000 mL | Freq: Four times a day (QID) | ORAL | 0 refills | Status: DC | PRN

## 2024-09-22 MED ORDER — METHYLPREDNISOLONE ACETATE 80 MG/ML IJ SUSP
80.0000 mg | Freq: Once | INTRAMUSCULAR | Status: AC
  Administered 2024-09-22: 80 mg via INTRAMUSCULAR

## 2024-09-22 MED ORDER — OSELTAMIVIR PHOSPHATE 75 MG PO CAPS: 75.0000 mg | ORAL_CAPSULE | Freq: Two times a day (BID) | ORAL | 0 refills | Status: AC

## 2024-09-22 NOTE — Discharge Instructions (Signed)
 Influenza A/B is a virus and should steadily improve in time it can take up to 7 to 10 days before you truly start to see a turnaround however things will get better, virus most likely flaring your asthma  You have been given an injection of steroids and 2 DuoNeb treatments here today in the clinic to open and relax your airway  Start tomorrow take oral prednisone  every morning with food as directed  May use Tessalon  pill every 8 hours as needed for cough and may use cough syrup every 6 hours but please be mindful can make you drowsy  Begin Tamiflu every morning and every evening for 5 days to reduce the amount of virus in the body which helps to minimize symptoms  Will need to quarantine until without fever for 24 hours.,  If no fever may continue activity wearing mask for 5 days from the start of your symptoms    You can take Tylenol  and/or Ibuprofen as needed for fever reduction and pain relief.   For cough: honey 1/2 to 1 teaspoon (you can dilute the honey in water or another fluid).  You can also use guaifenesin  and dextromethorphan for cough. You can use a humidifier for chest congestion and cough.  If you don't have a humidifier, you can sit in the bathroom with the hot shower running.      For sore throat: try warm salt water gargles, cepacol lozenges, throat spray, warm tea or water with lemon/honey, popsicles or ice, or OTC cold relief medicine for throat discomfort.   For congestion: take a daily anti-histamine like Zyrtec , Claritin , and a oral decongestant, such as pseudoephedrine.  You can also use Flonase  1-2 sprays in each nostril daily.   It is important to stay hydrated: drink plenty of fluids (water, gatorade/powerade/pedialyte, juices, or teas) to keep your throat moisturized and help further relieve irritation/discomfort.

## 2024-09-22 NOTE — ED Triage Notes (Signed)
 Patient complains of SOB, Wheezing and cough x 2 days. Patient daughter diagnosed with FLU today. Patient has used inhaler and neb tx with no relief.

## 2024-09-22 NOTE — ED Provider Notes (Signed)
 " CAY RALPH PELT    CSN: [id number] Arrival date & time: 09/22/24  1336      History   Chief Complaint Chief Complaint  Patient presents with   Fever   Cough   Wheezing    HPI Heather [name] is a 48 y.o. female.   Patient presents for evaluation of fever, shortness of breath wheezing and a nonproductive cough beginning 2 days ago.  Daughter testing positive for influenza today.  Has attempted use of inhaler, nebulizer, Mucinex  and additional over-the-counter medications without relief.  History of asthma.  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/02/2024   NSTEMI (non-ST elevated myocardial infarction) (HCC) 02/21/2024   Bipolar disorder (HCC) 02/21/2024   Overweight (BMI 25.0-29.9) 02/21/2024   Hypokalemia 09/14/2023   COVID-19 virus infection 07/24/2023   Acute sinusitis 07/13/2023   Menstrual changes 04/27/2023   Leukocytosis 02/23/2023   B12 deficiency 01/28/2023   Leg pain 07/19/2022   ADHD 06/30/2022   Irritable bowel syndrome (IBS) 06/29/2022   Abnormal Pap smear of cervix 03/15/2022   Constipation 01/05/2022   Non-intractable vomiting    Hiatal hernia    Retained food in stomach    Special screening for malignant neoplasms, colon    Rectal polyp    Asthmatic bronchitis with acute exacerbation 05/27/2021   Pre-op evaluation 05/24/2021   Anxiety 02/27/2021   Cough  02/25/2021   Vitamin D  deficiency 02/04/2021   Fatigue 09/29/2020   Nausea 09/12/2020   Encounter for completion of form with patient 07/18/2020   History of COVID-19 06/11/2020   Bronchitis 09/22/2019   Sleep disturbance 03/16/2019   Herpes zoster without complication 01/29/2019   History of seizure 01/22/2019   GERD (gastroesophageal reflux disease) 01/05/2019   Back pain 12/21/2018   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   Abdominal pain 11/19/2018   Conjunctivitis 05/23/2018   Sleeping difficulty 10/31/2017   Rash 06/11/2017   Health care maintenance 10/12/2016   History of miscarriage 10/12/2016   Hypercholesterolemia 07/09/2016   Migraine 07/20/2013   Seizure disorder (HCC) 07/20/2013   Asthma 07/20/2013   Alternating constipation and diarrhea 07/20/2013   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    Past Surgical History:  Procedure Laterality Date   COLONOSCOPY WITH PROPOFOL  N/A 06/03/2021   Procedure: COLONOSCOPY WITH PROPOFOL ;  Surgeon: Janalyn Keene NOVAK, MD;  Location: [facility] ENDOSCOPY;  Service: Endoscopy;  Laterality: N/A;   ESOPHAGOGASTRODUODENOSCOPY N/A 06/03/2021   Procedure: ESOPHAGOGASTRODUODENOSCOPY (EGD);  Surgeon: Janalyn Keene NOVAK, MD;  Location: [facility] ENDOSCOPY;  Service: Endoscopy;  Laterality: N/A;   LEFT HEART CATH AND CORONARY ANGIOGRAPHY N/A 02/21/2024   Procedure: LEFT HEART CATH AND CORONARY ANGIOGRAPHY;  Surgeon: Darron Deatrice LABOR, MD;  Location: [facility] INVASIVE CV LAB;  Service: Cardiovascular;  Laterality: N/A;  TUBAL LIGATION Bilateral 09/25/2002   tubal reversal      OB History     Gravida  4   Para  4   Term  4   Preterm      AB      Living  4      SAB      IAB      Ectopic      Multiple      Live Births  4            Home Medications    Prior to Admission medications  Medication Sig Start Date End Date Taking? Authorizing Provider  benzonatate  (TESSALON ) 100 MG capsule Take 1  capsule (100 mg total) by mouth every 8 (eight) hours. 09/22/24  Yes Kristle Wesch R, NP  guaiFENesin -codeine  100-10 MG/5ML syrup Take 5 mLs by mouth every 6 (six) hours as needed for cough. 09/22/24  Yes Aravind Chrismer, Shelba SAUNDERS, NP  oseltamivir (TAMIFLU) 75 MG capsule Take 1 capsule (75 mg total) by mouth every 12 (twelve) hours. 09/22/24  Yes Terrell Ostrand R, NP  predniSONE  (STERAPRED UNI-PAK 21 TAB) 10 MG (21) TBPK tablet Take by mouth daily. Take 6 tabs by mouth daily  for 1 days, then 5 tabs for 1 days, then 4 tabs for 1 days, then 3 tabs for 1 days, 2 tabs for 1 days, then 1 tab by mouth daily for 1 days 09/22/24  Yes Bridgett Hattabaugh R, NP  albuterol  (VENTOLIN  HFA) 108 (90 Base) MCG/ACT inhaler INHALE 1 TO 2 PUFFS BY MOUTH EVERY 4 HOURS AS NEEDED FOR WHEEZING FOR SHORTNESS OF BREATH 08/03/23   [name], MD  Albuterol -Budesonide  (AIRSUPRA ) 90-80 MCG/ACT AERO INHALE 2 PUFFS EVERY 6 HOURS 04/17/24   Assaker, Darrin, MD  amphetamine -dextroamphetamine  (ADDERALL XR) 20 MG 24 hr capsule Take 20 mg by mouth every morning. 07/07/24   [provider]  ascorbic acid (VITAMIN C) 1000 MG tablet Take 500 mg by mouth daily.    [provider]  aspirin  EC 81 MG tablet Take 1 tablet (81 mg total) by mouth daily. Swallow whole. 02/23/24   Dezii, Alexandra, DO  BELSOMRA 20 MG TABS Take 1 tablet by mouth at bedtime. 07/15/24   [provider]  cetirizine  (ZYRTEC ) 10 MG tablet Take 1 tablet by mouth once daily 10/04/22   [name], MD  cyanocobalamin  (VITAMIN B12) 1000 MCG/ML injection Inject 1,000 mcg into the muscle every 30 (thirty) days. 04/19/23   [provider]  diazepam  (VALIUM ) 10 MG tablet Take 1 tablet by mouth 4 (four) times daily as needed. 11/23/21   [provider]  divalproex  (DEPAKOTE  ER) 500 MG 24 hr tablet Take 1,500 mg by mouth at bedtime. 01/30/24   [provider]  Dupilumab  (DUPIXENT ) 300 MG/2ML SOAJ Inject 300 mg into the skin every  14 (fourteen) days. 08/08/24   Assaker, Darrin, MD  ezetimibe (ZETIA) 10 MG tablet Take 1 tablet by mouth daily. 02/26/24 02/25/25  [provider]  fluticasone  (FLONASE ) 50 MCG/ACT nasal spray Place 1 spray into both nostrils daily. 07/01/24   Assaker, Darrin, MD  fluticasone -salmeterol (ADVAIR HFA) 230-21 MCG/ACT inhaler Inhale 2 puffs into the lungs 2 (two) times daily. 07/01/24   Assaker, Darrin, MD  INCRUSE ELLIPTA  62.5 MCG/ACT AEPB 1 puff daily. 08/25/24   [provider]  ipratropium (ATROVENT ) 0.03 % nasal spray Place 2 sprays into both nostrils every 12 (twelve) hours. 08/21/23   Assaker, Darrin, MD  ipratropium-albuterol  (DUONEB) 0.5-2.5 (3)  MG/3ML SOLN Inhale 3 mLs into the lungs every 4 (four) hours as needed. 07/04/24   Assaker, Darrin, MD  isosorbide mononitrate (IMDUR) 30 MG 24 hr tablet Take 30 mg by mouth daily. 03/13/24 03/13/25  [provider]  linaclotide  (LINZESS ) 290 MCG CAPS capsule Take 1 capsule (290 mcg total) by mouth daily before breakfast. Patient taking differently: Take 290 mcg by mouth daily as needed. 01/15/24   Unk Corinn Skiff, MD  losartan (COZAAR) 25 MG tablet Take 25 mg by mouth daily. 03/13/24 03/13/25  [provider]  metoprolol succinate (TOPROL-XL) 25 MG 24 hr tablet Take 1 tablet by mouth daily. 02/26/24 02/25/25  [provider]  montelukast  (SINGULAIR ) 10 MG tablet Take 1 tablet (10 mg total) by mouth at bedtime. 01/04/24   Assaker, Darrin, MD  nitroGLYCERIN  (NITROSTAT ) 0.4 MG SL tablet Place 0.4 mg under the tongue every 5 (five) minutes as needed. 02/25/24 02/24/25  [provider]  omeprazole  (PRILOSEC) 40 MG capsule Take 1 capsule (40 mg total) by mouth daily. 09/01/24   [name], MD  ondansetron  (ZOFRAN -ODT) 4 MG disintegrating tablet DISSOLVE 1 TABLET IN MOUTH TWICE DAILY AS NEEDED FOR NAUSEA AND  VOMITING 02/14/24   [name], MD  polyethylene glycol powder  (GLYCOLAX /MIRALAX ) 17 GM/SCOOP powder Take 17 g by mouth.    [provider]  prasugrel (EFFIENT) 10 MG TABS tablet Take 1 tablet by mouth daily. 02/26/24 02/25/25  [provider]  rosuvastatin (CRESTOR) 40 MG tablet Take 40 mg by mouth daily. 03/13/24 03/13/25  [provider]  Spacer/Aero-Holding Raguel (AEROCHAMBER MV) inhaler Use as instructed 07/01/24   Assaker, Darrin, MD  Ubrogepant 100 MG TABS Take 100 mg by mouth. 05/15/24 05/15/25  [provider]  Vitamin D , Ergocalciferol , (DRISDOL ) 1.25 MG (50000 UNIT) CAPS capsule Take 1 capsule (50,000 Units total) by mouth every 7 (seven) days. 06/03/24   [name], MD    Family History Family History  Problem Relation Age of Onset   Hypertension Mother    Diabetes Mother    Arthritis Mother    Heart disease Mother    ADD / ADHD Daughter    ADD / ADHD Daughter    ADD / ADHD Son    Breast cancer Neg Hx     Social History Social History[1]   Allergies   Levaquin  [levofloxacin ], Tramadol, Amoxicillin, Augmentin [amoxicillin-pot clavulanate], Erythromycin , and Lithium   Review of Systems Review of Systems  Constitutional:  Positive for fever.  Respiratory:  Positive for cough and wheezing.      Physical Exam Triage Vital Signs ED Triage Vitals  Encounter Vitals Group     BP 09/22/24 1611 (!) 150/86     Girls Systolic BP Percentile --      Girls Diastolic BP Percentile --      Boys Systolic BP Percentile --      Boys Diastolic BP Percentile --      Pulse Rate 09/22/24 1611 (!) 110     Resp 09/22/24 1611 (!) 26     Temp 09/22/24 1611 (!) 97.1 F (36.2 C)     Temp Source 09/22/24 1611 Tympanic     SpO2 09/22/24 1611 98 %     Weight --      Height --      Head Circumference --      Peak Flow --      Pain Score 09/22/24 1619 0     Pain Loc --  Pain Education --      Exclude from Growth Chart --    No data found.  Updated Vital Signs BP (!) 150/86 (BP Location: Right Arm)    Pulse (!) 110   Temp (!) 97.1 F (36.2 C) (Tympanic)   Resp (!) 26   SpO2 98%   Visual Acuity Right Eye Distance:   Left Eye Distance:   Bilateral Distance:    Right Eye Near:   Left Eye Near:    Bilateral Near:     Physical Exam Constitutional:      Appearance: Normal appearance.  HENT:     Right Ear: Tympanic membrane, ear canal and external ear normal.     Left Ear: Tympanic membrane, ear canal and external ear normal.     Nose: Congestion present.     Mouth/Throat:     Pharynx: No oropharyngeal exudate or posterior oropharyngeal erythema.  Eyes:     Extraocular Movements: Extraocular movements intact.  Cardiovascular:     Rate and Rhythm: Regular rhythm. Tachycardia present.     Pulses: Normal pulses.     Heart sounds: Normal heart sounds.  Pulmonary:     Effort: Pulmonary effort is normal.     Breath sounds: Wheezing present.  Neurological:     Mental Status: [name] and oriented to person, place, and time.      UC Treatments / Results  Labs (all labs ordered are listed, but only abnormal results are displayed) Labs Reviewed - No data to display  EKG   Radiology No results found.  Procedures Procedures (including critical care time)  Medications Ordered in UC Medications  methylPREDNISolone  acetate (DEPO-MEDROL ) injection 80 mg (80 mg Intramuscular Given 09/22/24 1621)  ipratropium-albuterol  (DUONEB) 0.5-2.5 (3) MG/3ML nebulizer solution 3 mL (3 mLs Nebulization Given 09/22/24 1621)  ipratropium-albuterol  (DUONEB) 0.5-2.5 (3) MG/3ML nebulizer solution 3 mL (3 mLs Nebulization Given 09/22/24 1705)    Initial Impression / Assessment and Plan / UC Course  I have reviewed the triage vital signs and the nursing notes.  Pertinent labs & imaging results that were available during my care of the patient were reviewed by me and considered in my medical decision making (see chart for details).  Exposure to flu, mild intermittent asthma with acute  exacerbation  O2 saturation 98% on room air, patient in respiratory distress, persistently coughing with audible wheeze, breathing labored with respiratory rate of 26, given injection of methylprednisolone  and DuoNeb and on reevaluation wheezing heard to auscultation but coarse, given second DuoNeb and on reevaluation wheezing has subsided, patient endorsing that she is feeling better, empirically treating with for influenza as contact is within household, prescribed Tamiflu and discussed administration for management of shortness of breath and wheezing prescribed oral prednisone , Tessalon  and guaifenesin  codeine , PDMP reviewed, low risk, recommended additional over-the-counter medication and nonpharmacological supportive care and advised to monitor closely and for any worsening breathing to go to the nearest emergency department, verbalized understanding Final Clinical Impressions(s) / UC Diagnoses   Final diagnoses:  Exposure to the flu  Mild intermittent asthma with acute exacerbation     Discharge Instructions      Influenza A/B is a virus and should steadily improve in time it can take up to 7 to 10 days before you truly start to see a turnaround however things will get better, virus most likely flaring your asthma  You have been given an injection of steroids and 2 DuoNeb treatments here today in the clinic to open and  relax your airway  Start tomorrow take oral prednisone  every morning with food as directed  May use Tessalon  pill every 8 hours as needed for cough and may use cough syrup every 6 hours but please be mindful can make you drowsy  Begin Tamiflu every morning and every evening for 5 days to reduce the amount of virus in the body which helps to minimize symptoms  Will need to quarantine until without fever for 24 hours.,  If no fever may continue activity wearing mask for 5 days from the start of your symptoms    You can take Tylenol  and/or Ibuprofen as needed for fever  reduction and pain relief.   For cough: honey 1/2 to 1 teaspoon (you can dilute the honey in water or another fluid).  You can also use guaifenesin  and dextromethorphan for cough. You can use a humidifier for chest congestion and cough.  If you don't have a humidifier, you can sit in the bathroom with the hot shower running.      For sore throat: try warm salt water gargles, cepacol lozenges, throat spray, warm tea or water with lemon/honey, popsicles or ice, or OTC cold relief medicine for throat discomfort.   For congestion: take a daily anti-histamine like Zyrtec , Claritin , and a oral decongestant, such as pseudoephedrine.  You can also use Flonase  1-2 sprays in each nostril daily.   It is important to stay hydrated: drink plenty of fluids (water, gatorade/powerade/pedialyte, juices, or teas) to keep your throat moisturized and help further relieve irritation/discomfort.    ED Prescriptions     Medication Sig Dispense Auth. Provider   predniSONE  (STERAPRED UNI-PAK 21 TAB) 10 MG (21) TBPK tablet Take by mouth daily. Take 6 tabs by mouth daily  for 1 days, then 5 tabs for 1 days, then 4 tabs for 1 days, then 3 tabs for 1 days, 2 tabs for 1 days, then 1 tab by mouth daily for 1 days 21 tablet Cilicia Borden R, NP   oseltamivir (TAMIFLU) 75 MG capsule Take 1 capsule (75 mg total) by mouth every 12 (twelve) hours. 10 capsule Anjanae Woehrle R, NP   benzonatate  (TESSALON ) 100 MG capsule Take 1 capsule (100 mg total) by mouth every 8 (eight) hours. 21 capsule Ronnald Shedden R, NP   guaiFENesin -codeine  100-10 MG/5ML syrup Take 5 mLs by mouth every 6 (six) hours as needed for cough. 120 mL Corbitt Cloke, Shelba SAUNDERS, NP      I have reviewed the PDMP during this encounter.    [1]  Social History Tobacco Use   Smoking status: Former    Current packs/day: 0.00    Average packs/day: 0.5 packs/day for 5.0 years (2.5 ttl pk-yrs)    Types: Cigarettes    Start date: 2008    Quit date: 2013    Years since  quitting: 13.0   Smokeless tobacco: Never   Tobacco comments:    Quit mid October 2014  Vaping Use   Vaping status: Never Used  Substance Use Topics   Alcohol use: No    Alcohol/week: 0.0 standard drinks of alcohol   Drug use: No     Teresa Shelba SAUNDERS, NP 09/22/24 1720  "

## 2024-09-23 ENCOUNTER — Inpatient Hospital Stay: Admitting: Oncology

## 2024-09-23 ENCOUNTER — Ambulatory Visit: Payer: Self-pay

## 2024-09-24 NOTE — Telephone Encounter (Signed)
 Lvm for pt to return call. Need to know if pt is needing this medication refilled or not

## 2024-10-06 ENCOUNTER — Inpatient Hospital Stay: Attending: Oncology | Admitting: Oncology

## 2024-10-06 ENCOUNTER — Encounter: Payer: Self-pay | Admitting: Oncology

## 2024-10-06 VITALS — BP 139/88 | HR 90 | Temp 96.8°F | Resp 18 | Wt 172.2 lb

## 2024-10-06 DIAGNOSIS — Z87891 Personal history of nicotine dependence: Secondary | ICD-10-CM | POA: Diagnosis not present

## 2024-10-06 DIAGNOSIS — D472 Monoclonal gammopathy: Secondary | ICD-10-CM | POA: Diagnosis not present

## 2024-10-06 DIAGNOSIS — D72829 Elevated white blood cell count, unspecified: Secondary | ICD-10-CM | POA: Diagnosis not present

## 2024-10-06 DIAGNOSIS — R771 Abnormality of globulin: Secondary | ICD-10-CM | POA: Insufficient documentation

## 2024-10-06 NOTE — Progress Notes (Signed)
 Pt here for follow up. No new concerns voiced.

## 2024-10-06 NOTE — Assessment & Plan Note (Signed)
 Chronic,likely reactive due to chronic inflammation, and long term steroid inhaler usage Peripheral flow cytometry showed that the lymphocytosis is due to an increase in CD4+ T cells, likely a reactive process  Observation.

## 2024-10-06 NOTE — Assessment & Plan Note (Signed)
 Mildly decreased IgG.  She has recurrent infection. Refer to immunology for evaluation.

## 2024-10-06 NOTE — Assessment & Plan Note (Addendum)
 IgA MGUS  Lab Results  Component Value Date   MPROTEIN Not Observed 09/12/2024   KAPLAMBRATIO 0.96 09/12/2024    observation.  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 Check SPEP and light chain ratio in 1 year. If still negative, will hold off additional labs.

## 2024-10-06 NOTE — Progress Notes (Signed)
 Hematology/Oncology Consult note [phone] [phone]     Patient Care Team: [name], MD as PCP - General (Internal Medicine) Babara Call, MD as Consulting Physician (Oncology)  ASSESSMENT & PLAN  MGUS (monoclonal gammopathy of unknown significance) IgA MGUS  Lab Results  Component Value Date   MPROTEIN Not Observed 09/12/2024   KAPLAMBRATIO 0.96 09/12/2024    observation.  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 Check SPEP and light chain ratio in 1 year. If still negative, will hold off additional labs.   Leukocytosis Chronic,likely reactive due to chronic inflammation, and long term steroid inhaler usage Peripheral flow cytometry showed that the lymphocytosis is due to an increase in CD4+ T cells, likely a reactive process  Observation.   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 Refer to immunology for evaluation.    Orders Placed This Encounter  Procedures   CBC with Differential (Cancer Center Only)    Standing Status:   Future    Expected Date:   10/06/2025    Expiration Date:   01/04/2026   CMP (Cancer Center only)    Standing Status:   Future    Expected Date:   10/06/2025    Expiration Date:   01/04/2026   Kappa/lambda light chains    Standing Status:   Future    Expected Date:   10/06/2025    Expiration Date:   01/04/2026   Multiple Myeloma Panel (SPEP&IFE w/QIG)    Standing Status:   Future    Expected Date:   10/06/2025    Expiration Date:   01/04/2026   Ambulatory referral to Immunology    Referral Priority:   Routine    Referral Type:   Consultation    Referral Reason:   Specialty Services Required    Requested Specialty:   Immunology    Number of Visits Requested:   1   Follow-up in 1 year All questions were answered. The patient knows to call the clinic with any problems, questions or concerns.   Call Babara, MD, PhD [facility] Hematology  Oncology 10/06/2024       CHIEF COMPLAINTS/PURPOSE OF CONSULTATION:  Leukocytosis/elevate white count  HISTORY OF PRESENTING ILLNESS:  Heather [name] 49 y.o. female is here because of elevated WBC.  Patient has chronic leukocytosis predominantly neutrophilia.  Dated back to 2016. Patient reports history of IBS, asthma, chronic cough, known COVID symptoms. She has asthma flare multiple times per year, this year she has had steroid treatments 4-5 times.  Currently not on steroids.  T patient reports that each time with asthma flare, her white count goes up to 18-19, and if she takes antibiotics, leukocytosis improves.  Patient uses steroid inhaler 2-3 times per day. Patient has a seizure disorder, she is on Depakote , There is not reported symptoms of urinary frequency/urgency or dysuria, diarrhea, or abnormal skin rash.  Endorses chronic hip pain, lower extremity pain.  No hand joints deformity or pain. Denies any unintentional weight loss, night sweats or fever.  She denies smoking currently. She had no prior history or diagnosis of cancer.  Her age appropriate screening programs are up-to-date. The patient has no prior diagnosis of autoimmune disease and was not prescribed corticosteroids related products.  INTERVAL HISTORY Heather [name] is a 49 y.o. female who has above history reviewed by me today presents for follow up visit for leukocytosis. Patient reports acute on chronic cough spells.  She has history  of asthma, multiple flares.  Long COVID syndrome.  She follows up with primary care provider and pulmonology, and long COVID clinic. Patient recently had upper respiratory infection and was seen in ED.  Patient reports frequent upper respiratory infection and asthma flare.SABRA   MEDICAL HISTORY:  Past Medical History:  Diagnosis Date   Anxiety    Arthritis    Asthma    CHF (congestive heart failure) (HCC)    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 hauler    Depression    bipolar   Dysrhythmia     Fainting spell    H/O   GERD (gastroesophageal reflux disease)    H/O cardiac arrhythmia    Migraines    Seizures (HCC)    Sleep apnea     SURGICAL HISTORY: Past Surgical History:  Procedure Laterality Date   COLONOSCOPY WITH PROPOFOL  N/A 06/03/2021   Procedure: COLONOSCOPY WITH PROPOFOL ;  Surgeon: Janalyn Keene NOVAK, MD;  Location: [facility] ENDOSCOPY;  Service: Endoscopy;  Laterality: N/A;   ESOPHAGOGASTRODUODENOSCOPY N/A 06/03/2021   Procedure: ESOPHAGOGASTRODUODENOSCOPY (EGD);  Surgeon: Janalyn Keene NOVAK, MD;  Location: [facility] ENDOSCOPY;  Service: Endoscopy;  Laterality: N/A;   LEFT HEART CATH AND CORONARY ANGIOGRAPHY N/A 02/21/2024   Procedure: LEFT HEART CATH AND CORONARY ANGIOGRAPHY;  Surgeon: Darron Deatrice LABOR, MD;  Location: [facility] INVASIVE CV LAB;  Service: Cardiovascular;  Laterality: N/A;   TUBAL LIGATION Bilateral 09/25/2002   tubal reversal      SOCIAL HISTORY: Social History   Socioeconomic History   Marital status: Married    Spouse name: Not on file   Number of children: Not on file   Years of education: Not on file   Highest education level: Not on file  Occupational History   Not on file  Tobacco Use   Smoking status: Former    Current packs/day: 0.00    Average packs/day: 0.5 packs/day for 5.0 years (2.5 ttl pk-yrs)    Types: Cigarettes    Start date: 2008    Quit date: 2013    Years since quitting: 13.0   Smokeless tobacco: Never   Tobacco comments:    Quit mid October 2014  Vaping Use   Vaping status: Never Used  Substance and Sexual Activity   Alcohol use: No    Alcohol/week: 0.0 standard drinks of alcohol   Drug use: No   Sexual activity: Yes    Birth control/protection: None  Other Topics Concern   Not on file  Social History Narrative   Not on file   Social Drivers of Health   Tobacco Use: Medium Risk (10/06/2024)   Patient History    Smoking Tobacco Use: Former    Smokeless Tobacco Use: Never    Passive Exposure: Not on file  Financial  Resource Strain: Medium Risk (02/23/2024)   Received from [facility]   Overall Financial Resource Strain (CARDIA)    Difficulty of Paying Living Expenses: Somewhat hard  Food Insecurity: Food Insecurity Present (02/23/2024)   Received from [facility]   Epic    Within the past 12 months, you worried that your food would run out before you got the money to buy more.: Sometimes true    Within the past 12 months, the food you bought just didn't last and you didn't have money to get more.: Sometimes true  Transportation Needs: No Transportation Needs (02/23/2024)   Received from [facility] - Transportation    In the past 12 months, has lack of  transportation kept you from medical appointments or from getting medications?: No    Lack of Transportation (Non-Medical): No  Physical Activity: Unknown (10/30/2023)   Exercise Vital Sign    Days of Exercise per Week: Patient declined    Minutes of Exercise per Session: Not on file  Stress: Stress Concern Present (10/30/2023)   [employer] - Occupational Stress Questionnaire    Feeling of Stress : To some extent  Social Connections: Unknown (10/30/2023)   Social Connection and Isolation Panel    Frequency of Communication with Friends and Family: Patient declined    Frequency of Social Gatherings with Friends and Family: Never    Attends Religious Services: Never    Database Administrator or Organizations: No    Attends Engineer, Structural: Not on file    Marital Status: Patient declined  Intimate Partner Violence: Patient Unable To Answer (02/21/2024)   Humiliation, Afraid, Rape, and Kick questionnaire    Fear of Current or Ex-Partner: Patient unable to answer    Emotionally Abused: Patient unable to answer    Physically Abused: Patient unable to answer    Sexually Abused: Patient unable to answer  Depression (PHQ2-9): High Risk (09/01/2024)    Depression (PHQ2-9)    PHQ-2 Score: 20  Alcohol Screen: Not on file  Housing: High Risk (03/13/2024)   Received from [facility]   Epic    In the last 12 months, was there a time when you were not able to pay the mortgage or rent on time?: Yes    In the past 12 months, how many times have you moved where you were living?: 0    At any time in the past 12 months, were you homeless or living in a shelter (including now)?: No  Utilities: Patient Declined (02/23/2024)   Received from [facility] Utilities    Threatened with loss of utilities: Patient declined  Health Literacy: Not on file    FAMILY HISTORY: Family History  Problem Relation Age of Onset   Hypertension Mother    Diabetes Mother    Arthritis Mother    Heart disease Mother    ADD / ADHD Daughter    ADD / ADHD Daughter    ADD / ADHD Son    Breast cancer Neg Hx     ALLERGIES:  is allergic to levaquin  [levofloxacin ], tramadol, amoxicillin, augmentin [amoxicillin-pot clavulanate], erythromycin , and lithium.  MEDICATIONS:  Current Outpatient Medications  Medication Sig Dispense Refill   albuterol  (VENTOLIN  HFA) 108 (90 Base) MCG/ACT inhaler INHALE 2 PUFFS BY MOUTH EVERY 6 HOURS AS NEEDED FOR WHEEZING AND FOR SHORTNESS OF BREATH 1 each 1   Albuterol -Budesonide  (AIRSUPRA ) 90-80 MCG/ACT AERO INHALE 2 PUFFS EVERY 6 HOURS 11 g 6   amphetamine -dextroamphetamine  (ADDERALL XR) 20 MG 24 hr capsule Take 20 mg by mouth every morning.     ascorbic acid (VITAMIN C) 1000 MG tablet Take 500 mg by mouth daily.     aspirin  EC 81 MG tablet Take 1 tablet (81 mg total) by mouth daily. Swallow whole.     BELSOMRA 20 MG TABS Take 1 tablet by mouth at bedtime.     benzonatate  (TESSALON ) 100 MG capsule Take 1 capsule (100 mg total) by mouth every 8 (eight) hours. 21 capsule 0   cetirizine  (ZYRTEC ) 10 MG tablet Take 1 tablet by mouth once daily 90 tablet 3   cyanocobalamin  (VITAMIN B12) 1000 MCG/ML  injection Inject  1,000 mcg into the muscle every 30 (thirty) days.     diazepam  (VALIUM ) 10 MG tablet Take 1 tablet by mouth 4 (four) times daily as needed.     divalproex  (DEPAKOTE  ER) 500 MG 24 hr tablet Take 1,500 mg by mouth at bedtime.     Dupilumab  (DUPIXENT ) 300 MG/2ML SOAJ Inject 300 mg into the skin every 14 (fourteen) days. 12 mL 1   ezetimibe (ZETIA) 10 MG tablet Take 1 tablet by mouth daily.     fluticasone  (FLONASE ) 50 MCG/ACT nasal spray Place 1 spray into both nostrils daily. 48 g 1   fluticasone -salmeterol (ADVAIR HFA) 230-21 MCG/ACT inhaler Inhale 2 puffs into the lungs 2 (two) times daily. 1 each 12   guaiFENesin -codeine  100-10 MG/5ML syrup Take 5 mLs by mouth every 6 (six) hours as needed for cough. 120 mL 0   INCRUSE ELLIPTA  62.5 MCG/ACT AEPB 1 puff daily.     ipratropium (ATROVENT ) 0.03 % nasal spray Place 2 sprays into both nostrils every 12 (twelve) hours. 30 mL 12   ipratropium-albuterol  (DUONEB) 0.5-2.5 (3) MG/3ML SOLN Inhale 3 mLs into the lungs every 4 (four) hours as needed. 360 mL 11   isosorbide mononitrate (IMDUR) 30 MG 24 hr tablet Take 30 mg by mouth daily.     linaclotide  (LINZESS ) 290 MCG CAPS capsule Take 1 capsule (290 mcg total) by mouth daily before breakfast. 30 capsule 1   losartan (COZAAR) 25 MG tablet Take 25 mg by mouth daily.     metoprolol succinate (TOPROL-XL) 25 MG 24 hr tablet Take 1 tablet by mouth daily.     montelukast  (SINGULAIR ) 10 MG tablet Take 1 tablet (10 mg total) by mouth at bedtime. 30 tablet 11   nitroGLYCERIN  (NITROSTAT ) 0.4 MG SL tablet Place 0.4 mg under the tongue every 5 (five) minutes as needed.     omeprazole  (PRILOSEC) 40 MG capsule Take 1 capsule (40 mg total) by mouth daily. 90 capsule 1   ondansetron  (ZOFRAN -ODT) 4 MG disintegrating tablet DISSOLVE 1 TABLET IN MOUTH TWICE DAILY AS NEEDED FOR NAUSEA AND  VOMITING 20 tablet 0   polyethylene glycol powder (GLYCOLAX /MIRALAX ) 17 GM/SCOOP powder Take 17 g by mouth.     prasugrel  (EFFIENT) 10 MG TABS tablet Take 1 tablet by mouth daily.     rosuvastatin (CRESTOR) 40 MG tablet Take 40 mg by mouth daily.     Spacer/Aero-Holding Chambers (AEROCHAMBER MV) inhaler Use as instructed 1 each 0   Ubrogepant 100 MG TABS Take 100 mg by mouth.     Vitamin D , Ergocalciferol , (DRISDOL ) 1.25 MG (50000 UNIT) CAPS capsule Take 1 capsule (50,000 Units total) by mouth every 7 (seven) days. 12 capsule 0   oseltamivir  (TAMIFLU ) 75 MG capsule Take 1 capsule (75 mg total) by mouth every 12 (twelve) hours. (Patient not taking: Reported on 10/06/2024) 10 capsule 0   predniSONE  (STERAPRED UNI-PAK 21 TAB) 10 MG (21) TBPK tablet Take by mouth daily. Take 6 tabs by mouth daily  for 1 days, then 5 tabs for 1 days, then 4 tabs for 1 days, then 3 tabs for 1 days, 2 tabs for 1 days, then 1 tab by mouth daily for 1 days (Patient not taking: Reported on 10/06/2024) 21 tablet 0   No current facility-administered medications for this visit.    Review of Systems  Constitutional:  Negative for appetite change, chills, fatigue and fever.  HENT:   Negative for hearing loss and voice change.   Eyes:  Negative for  eye problems.  Respiratory:  Negative for chest tightness and cough.   Cardiovascular:  Negative for chest pain.  Gastrointestinal:  Negative for abdominal distention, abdominal pain and blood in stool.  Endocrine: Negative for hot flashes.  Genitourinary:  Negative for difficulty urinating and frequency.   Musculoskeletal:  Positive for arthralgias.  Skin:  Negative for itching and rash.  Neurological:  Negative for extremity weakness.  Hematological:  Negative for adenopathy.  Psychiatric/Behavioral:  Negative for confusion.      PHYSICAL EXAMINATION:  Vitals:   10/06/24 1114  BP: 139/88  Pulse: 90  Resp: 18  Temp: (!) 96.8 F (36 C)   Filed Weights   10/06/24 1114  Weight: 172 lb 3.2 oz (78.1 kg)    Physical Exam Constitutional:      General: She is not in acute distress.     Appearance: She is not diaphoretic.  HENT:     Head: Normocephalic and atraumatic.     Mouth/Throat:     Pharynx: No oropharyngeal exudate.  Eyes:     General: No scleral icterus. Cardiovascular:     Rate and Rhythm: Normal rate.  Pulmonary:     Effort: Pulmonary effort is normal. No respiratory distress.     Breath sounds: Wheezing present. No rales.  Abdominal:     General: There is no distension.     Palpations: Abdomen is soft.     Tenderness: There is no abdominal tenderness.  Musculoskeletal:        General: Normal range of motion.     Cervical back: Normal range of motion and neck supple.  Skin:    Findings: No erythema.  Neurological:     Mental Status: [name] and oriented to person, place, and time. Mental status is at baseline.     Cranial Nerves: No cranial nerve deficit.     Motor: No abnormal muscle tone.  Psychiatric:        Mood and Affect: Affect normal.     LABORATORY DATA:  I have reviewed the data as listed    Latest Ref Rng & Units 09/12/2024   11:12 AM 06/16/2024    2:08 PM 05/30/2024    3:54 PM  CBC  WBC 4.0 - 10.5 K/uL 13.7  13.0  14.5   Hemoglobin 12.0 - 15.0 g/dL 86.4  85.8  86.3   Hematocrit 36.0 - 46.0 % 40.9  42.9  42.0   Platelets 150 - 400 K/uL 227  231.0  277       Latest Ref Rng & Units 09/12/2024   11:11 AM 06/24/2024    3:09 PM 06/16/2024    2:08 PM  CMP  Glucose 70 - 99 mg/dL 840     BUN 6 - 20 mg/dL 7     Creatinine 9.55 - 1.00 mg/dL 9.28     Sodium 864 - 854 mmol/L 141     Potassium 3.5 - 5.1 mmol/L 3.5  4.2    Chloride 98 - 111 mmol/L 106     CO2 22 - 32 mmol/L 21     Calcium  8.9 - 10.3 mg/dL 9.4     Total Protein 6.5 - 8.1 g/dL 6.8   6.4   Total Bilirubin 0.0 - 1.2 mg/dL 0.3   0.6   Alkaline Phos 38 - 126 U/L 122   99   AST 15 - 41 U/L 28   24   ALT 0 - 44 U/L 36   37    Lab  Results  Component Value Date   LDH 127 08/04/2022    RADIOGRAPHIC STUDIES: I have personally reviewed the radiological images as listed  and agreed with the findings in the report. No results found.

## 2024-10-08 ENCOUNTER — Telehealth: Payer: Self-pay

## 2024-10-08 NOTE — Telephone Encounter (Signed)
 Referral faxed to [facility] immunology. Re: hypoglobulinemia, frequent infections. Fax confirmation received  Fax: [phone].

## 2024-10-13 NOTE — Progress Notes (Unsigned)
 "  Referring Provider: Harlene Cisco CNM  HPI:  Heather [name] is a 49 y.o.  [phone]  who presents today for evaluation and management of abnormal cervical cytology.    Prior pap smears:  08/20/2024:  LSIL HPV + (83,81,54 neg) 11/03/2021:  LSIL HPV neg   12/20/2013:  normal per health maintenance report in EPIC  Prior cervical / vaginal findings: *** Date:*** Results: ***  Prior cervical treatment(s): *** Date:*** Results: ***  Symptoms/History:  -Abnormal vaginal discharge: *** -Postmenopausal: *** -Intermenstrual bleeding: *** -Postcoital bleeding: *** -Bleeding problems (non-gyn): *** -Contraception: *** -Number of current sexual partners: *** -Number of partners in lifetime: *** -History of a high risk partner: *** -History of STDs: *** -Smoking: *** -Gardasil Vaccine: ***      ROS:  {Ros - complete:30496}  OB History  Gravida Para Term Preterm AB Living  4 4 4   4   SAB IAB Ectopic Multiple Live Births      4    # Outcome Date GA Lbr Len/2nd Weight Sex Type Anes PTL Lv  4 Term 2003     Vag-Spont  Y LIV  3 Term 1998     Vag-Spont   LIV  2 Term 1995     Vag-Spont   LIV  1 Term 79     Vag-Spont   LIV    Past Medical History:  Diagnosis Date   Anxiety    Arthritis    Asthma    CHF (congestive heart failure) (HCC)    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 hauler    Depression    bipolar   Dysrhythmia    Fainting spell    H/O   GERD (gastroesophageal reflux disease)    H/O cardiac arrhythmia    Migraines    Seizures (HCC)    Sleep apnea     Past Surgical History:  Procedure Laterality Date   COLONOSCOPY WITH PROPOFOL  N/A 06/03/2021   Procedure: COLONOSCOPY WITH PROPOFOL ;  Surgeon: Janalyn Keene NOVAK, MD;  Location: [facility] ENDOSCOPY;  Service: Endoscopy;  Laterality: N/A;   ESOPHAGOGASTRODUODENOSCOPY N/A 06/03/2021   Procedure: ESOPHAGOGASTRODUODENOSCOPY (EGD);  Surgeon: Janalyn Keene NOVAK, MD;  Location: [facility] ENDOSCOPY;  Service: Endoscopy;  Laterality: N/A;   LEFT  HEART CATH AND CORONARY ANGIOGRAPHY N/A 02/21/2024   Procedure: LEFT HEART CATH AND CORONARY ANGIOGRAPHY;  Surgeon: Darron Deatrice LABOR, MD;  Location: [facility] INVASIVE CV LAB;  Service: Cardiovascular;  Laterality: N/A;   TUBAL LIGATION Bilateral 09/25/2002   tubal reversal      SOCIAL HISTORY:  Social History   Substance and Sexual Activity  Alcohol Use No   Alcohol/week: 0.0 standard drinks of alcohol    Social History   Substance and Sexual Activity  Drug Use No     Family History  Problem Relation Age of Onset   Hypertension Mother    Diabetes Mother    Arthritis Mother    Heart disease Mother    ADD / ADHD Daughter    ADD / ADHD Daughter    ADD / ADHD Son    Breast cancer Neg Hx     ALLERGIES:  Levaquin  [levofloxacin ], Tramadol, Amoxicillin, Augmentin [amoxicillin-pot clavulanate], Erythromycin , and Lithium  She has a current medication list which includes the following prescription(s): albuterol , airsupra , amphetamine -dextroamphetamine , ascorbic acid, aspirin  ec, belsomra, benzonatate , cetirizine , cyanocobalamin , diazepam , divalproex , dupixent , ezetimibe, fluticasone , fluticasone -salmeterol, guaifenesin -codeine , incruse ellipta , ipratropium, ipratropium-albuterol , isosorbide mononitrate, linaclotide , losartan, metoprolol succinate, montelukast , nitroglycerin , omeprazole , ondansetron , oseltamivir , polyethylene glycol powder, prasugrel, prednisone , rosuvastatin, aerochamber mv, ubrogepant, and vitamin  d (ergocalciferol ).  Physical Exam: -Vitals:  There were no vitals taken for this visit.  PROCEDURE: Colposcopy performed with 4% acetic acid and Lugol's after informed consent obtained. Urine pregnancy test negative***.  Physical Exam                            -Aceto-white Lesions Location(s): See above              -Biopsy performed at *** o'clock               -ECC indicated and performed: {yes no:314532}     -Biopsy sites made hemostatic with pressure and Monsel's  solution   -Satisfactory colposcopy: {yes no:314532}    -Evidence of Invasive cervical CA :  NO  ASSESSMENT:  Heather [name] is a 49 y.o. [phone] with LSIL***ASCUS and HPV-HR positive*** 16/18/45 POS***NEG on recent pap (DATE), here for colposcopy today, performed as above without complications.  -ECC and *** cervical bx sent to pathology -Aftercare instructions for home reviewed, si/sx of when to call/return discussed. -Gardasil counseling done, #1 given today *** pt to consider.  -Discussed possible outcomes based on pathology; will call with results   Estil Mangle, DO [facility] "

## 2024-10-15 ENCOUNTER — Encounter: Payer: Self-pay | Admitting: Internal Medicine

## 2024-10-15 ENCOUNTER — Ambulatory Visit: Admitting: Pulmonary Disease

## 2024-10-15 ENCOUNTER — Encounter: Payer: Self-pay | Admitting: Pulmonary Disease

## 2024-10-15 ENCOUNTER — Encounter: Admitting: Obstetrics

## 2024-10-15 VITALS — BP 120/80 | HR 86 | Temp 98.4°F | Ht 66.5 in | Wt 168.8 lb

## 2024-10-15 DIAGNOSIS — Z87891 Personal history of nicotine dependence: Secondary | ICD-10-CM

## 2024-10-15 DIAGNOSIS — G4733 Obstructive sleep apnea (adult) (pediatric): Secondary | ICD-10-CM | POA: Diagnosis not present

## 2024-10-15 DIAGNOSIS — J4550 Severe persistent asthma, uncomplicated: Secondary | ICD-10-CM | POA: Diagnosis not present

## 2024-10-15 DIAGNOSIS — R87612 Low grade squamous intraepithelial lesion on cytologic smear of cervix (LGSIL): Secondary | ICD-10-CM

## 2024-10-15 DIAGNOSIS — Z01812 Encounter for preprocedural laboratory examination: Secondary | ICD-10-CM

## 2024-10-15 MED ORDER — PREDNISONE 10 MG PO TABS: ORAL_TABLET | ORAL | 0 refills | Status: AC

## 2024-10-15 MED ORDER — GUAIFENESIN-CODEINE 100-10 MG/5ML PO SOLN: 5.0000 mL | Freq: Three times a day (TID) | ORAL | 0 refills | Status: AC | PRN

## 2024-10-15 NOTE — Telephone Encounter (Signed)
 Discussed with Dr Malka. Unable to send in cough syrup. Rx sent in. Pt notified via my chart.

## 2024-10-15 NOTE — Progress Notes (Signed)
 "  Synopsis: Referred in by [name], MD   Subjective:   PATIENT ID: Heather [name] Level GENDER: female DOB: Jan 26, 1976, MRN: [id number]  Chief Complaint  Patient presents with   Acute Visit    UC on 12/29 for the flu. Cough with green/yellow sputum. Wheezing. SOB. Duoneb- 3-6 times day.  Airsupra - 2-3 times a day.  Advair- BID. Dupixent     HPI Expand All Collapse All    @Patient  ID: Heather [name] Level, female    DOB: 07/26/1976, 49 y.o.   MRN: [id number]      Chief Complaint  Patient presents with   Follow-up      Referring provider: [name], MD   HPI: 49 year old female former smoker followed for moderate persistent asthma Medical history significant for long-haul COVID 2021 Medical history significant for seizure disorder, MGUS, IBS, anxiety, ADHD GERD   TEST/EVENTS :  December 2021 IgE 55, allergy  panel positive for dust, dog and cat dander, grass and trees, absolute eosinophil count 100    PFT  Pending unable to perform them given wheezing and chest tightness.    Chest x-ray January 2024 clear lungs Chest ray May 19, 2022 clear lungs  CTA chest 2025 with mild mosaic attenuation consistent with air trapping.    07/13/2023 Follow up ; Asthma  Patient presents for 1 year follow-up.  Patient has moderate persistent asthma.  Patient remains on Breztri  inhaler twice daily.  She remains on Zyrtec  and Singulair  daily since last visit patient says her breathing has been doing better with less cough and wheezing . Feels advair worked better than breztri .  Patient was set up for PFTs last visit but unfortunately have not been completed. No increased albuterol  /neb use .   Complains of 3-4 weeks of sinus congestion , drainage, scratchy throat, sinus pressure /teeth pain. Has been using OTC sinus meds without much help. Taking Zyrtec  , Claritin  and Flonase . Has not been taking Singulair  .      08/17/2023 Follow up, Asthma, post covid bronchitis.   12/20/2023 - Here  for a follow up visit for asthma. Was doing well on Advair diskus 500 up until 2 weeks ago when she had a URI and her asthma flared up. Today in office with multiple coughing spells. Unable to perform Feno.   04/03/2024 - Ms. Heather [name] is here to follow up regarding her asthma. She was in an exacerbation when I saw her last in March. Currently recovering well. Started on Dupixent  in April 2025. Discussed adding incruse ellipta  to her regimen and she is agreeable with that.   07/01/2024 - Ms. Heather [name] is here to follow regarding her asthma. She is doing well overall and feels that dupixent  is helping. However she feels like advair is causing her to cough more frequently. PFTs today with ventilatory defect without obstruction. Significant response to bronchodilators. Gas trapping and normal DLCO. Overall consistent with asthma. We discussed changing her inhaler to Advair HFA with a chamber which she is agreeable with. Finally, she is more compliant with her cpap now she feels slightly better however fatigue persists.   10/15/2024 - Heather [name] is here for a follow up visit. She has persistent severe asthma. She is on maximal therapy including triple inhaler therapy, montelukast  and Dupixent . Unfortunately she is coming in today with an asthma exacerbation secondary to a URI. Will send her a prescription of Prednisone  and she could benefit from 5 days of cough syrup as well. This is her second exacerbation in 12  months period.  One consideration will be to switch biologics.   ROS All systems were reviewed and are negative except for the above.  Objective:   Vitals:   10/15/24 1125  BP: 120/80  Pulse: 86  Temp: 98.4 F (36.9 C)  SpO2: 97%  Weight: 168 lb 12.8 oz (76.6 kg)  Height: 5' 6.5 (1.689 m)     97% on RA BMI Readings from Last 3 Encounters:  10/15/24 26.84 kg/m  10/06/24 27.38 kg/m  09/01/24 26.17 kg/m   Wt Readings from Last 3 Encounters:  10/15/24 168 lb 12.8 oz (76.6 kg)  10/06/24 172  lb 3.2 oz (78.1 kg)  09/01/24 164 lb 9.6 oz (74.7 kg)    Physical Exam GEN: NAD, coughing intermittently in office. HEENT: Supple Neck, Reactive Pupils, EOMI  CVS: Normal S1, Normal S2, RRR, No murmurs or ES appreciated  Lungs: Clear air entry Abdomen: Soft, non tender, non distended, + BS  Extremities: Warm and well perfused, No edema   Labs and imaging were reviewed.   Ancillary Information   CBC    Component Value Date/Time   WBC 13.7 (H) 09/12/2024 1112   WBC 13.0 (H) 06/16/2024 1408   RBC 4.94 09/12/2024 1112   HGB 13.5 09/12/2024 1112   HGB 13.6 05/30/2024 1554   HCT 40.9 09/12/2024 1112   HCT 42.0 05/30/2024 1554   PLT 227 09/12/2024 1112   PLT 277 05/30/2024 1554   MCV 82.8 09/12/2024 1112   MCV 85 05/30/2024 1554   MCV 87 06/19/2014 1437   MCH 27.3 09/12/2024 1112   MCHC 33.0 09/12/2024 1112   RDW 13.3 09/12/2024 1112   RDW 13.8 05/30/2024 1554   RDW 14.2 06/19/2014 1437   LYMPHSABS 1.5 09/12/2024 1112   LYMPHSABS 4.5 (H) 05/30/2024 1554   MONOABS 0.2 09/12/2024 1112   EOSABS 0.0 09/12/2024 1112   EOSABS 0.1 05/30/2024 1554   BASOSABS 0.1 09/12/2024 1112   BASOSABS 0.2 05/30/2024 1554       Latest Ref Rng & Units 07/01/2024   11:03 AM  PFT Results  FVC-Pre L 2.59   FVC-Predicted Pre % 65   FVC-Post L 2.88   FVC-Predicted Post % 72   Pre FEV1/FVC % % 75   Post FEV1/FCV % % 78   FEV1-Pre L 1.95   FEV1-Predicted Pre % 61   FEV1-Post L 2.24   DLCO uncorrected ml/min/mmHg 21.71   DLCO UNC% % 92   DLCO corrected ml/min/mmHg 21.27   DLCO COR %Predicted % 90   DLVA Predicted % 110   TLC L 5.29   TLC % Predicted % 95   RV % Predicted % 126      Assessment & Plan:   #Severe Persistent Asthma  #Mild to moderate OSA EOS - 400 Total Ige - 55  Allergen panel + for multiple Ags most notable Dog dander.      Plan  Patient Instructions  []  c/w to advair HFA 230 2puffs bid.  []  c/w airsupra  as needed.  []  Started on Dupixent  12/2023  []  C/w  Singulair  10mg  At bedtime    []  Flonase  nasal 2 puffs BID  []  Continue with CPAP at bedtime. Improved compliance now at 97% in 30 days with improved AHI. However fatigue persists and she is trying to adapt slowly.   RTC 4 weeks  I personally spent a total of 40 minutes in the care of the patient today including preparing to see the patient, getting/reviewing separately obtained history, performing a medically appropriate  exam/evaluation, counseling and educating, documenting clinical information in the EHR, independently interpreting results, and communicating results.   Darrin Barn, MD [facility] 10/15/2024 4:26 PM   "

## 2024-10-15 NOTE — Telephone Encounter (Signed)
 Please call Jennene and let her know that I have reached out to Dr Roena. He stated he was trying to get the situation worked out and would let me know if I needed to do anything.

## 2024-10-20 ENCOUNTER — Ambulatory Visit

## 2024-11-11 ENCOUNTER — Encounter: Admitting: Obstetrics

## 2024-11-13 ENCOUNTER — Ambulatory Visit: Admitting: Pulmonary Disease

## 2024-11-26 ENCOUNTER — Other Ambulatory Visit

## 2024-11-28 ENCOUNTER — Ambulatory Visit: Admitting: Internal Medicine

## 2024-12-03 ENCOUNTER — Ambulatory Visit: Admitting: Internal Medicine

## 2025-09-22 ENCOUNTER — Inpatient Hospital Stay

## 2025-10-06 ENCOUNTER — Inpatient Hospital Stay: Admitting: Oncology
